# Patient Record
Sex: Male | Born: 1935 | Race: White | Hispanic: No | Marital: Married | Smoking: Former smoker
Health system: Southern US, Community
[De-identification: ages and names within clinical notes are randomized; demographics above are authoritative.]

## PROBLEM LIST (undated history)

## (undated) DIAGNOSIS — M069 Rheumatoid arthritis, unspecified: Secondary | ICD-10-CM

## (undated) DIAGNOSIS — D649 Anemia, unspecified: Secondary | ICD-10-CM

## (undated) DIAGNOSIS — A4159 Other Gram-negative sepsis: Secondary | ICD-10-CM

## (undated) DIAGNOSIS — T4145XA Adverse effect of unspecified anesthetic, initial encounter: Secondary | ICD-10-CM

## (undated) DIAGNOSIS — K635 Polyp of colon: Secondary | ICD-10-CM

## (undated) DIAGNOSIS — G9341 Metabolic encephalopathy: Secondary | ICD-10-CM

## (undated) DIAGNOSIS — K2270 Barrett's esophagus without dysplasia: Secondary | ICD-10-CM

## (undated) DIAGNOSIS — IMO0002 Reserved for concepts with insufficient information to code with codable children: Secondary | ICD-10-CM

## (undated) DIAGNOSIS — T8859XA Other complications of anesthesia, initial encounter: Secondary | ICD-10-CM

## (undated) DIAGNOSIS — N189 Chronic kidney disease, unspecified: Secondary | ICD-10-CM

## (undated) DIAGNOSIS — M1990 Unspecified osteoarthritis, unspecified site: Secondary | ICD-10-CM

## (undated) DIAGNOSIS — I10 Essential (primary) hypertension: Secondary | ICD-10-CM

## (undated) DIAGNOSIS — Z87891 Personal history of nicotine dependence: Secondary | ICD-10-CM

## (undated) DIAGNOSIS — J449 Chronic obstructive pulmonary disease, unspecified: Secondary | ICD-10-CM

## (undated) DIAGNOSIS — R911 Solitary pulmonary nodule: Secondary | ICD-10-CM

## (undated) HISTORY — PX: JOINT REPLACEMENT: SHX530

## (undated) HISTORY — PX: APPENDECTOMY: SHX54

## (undated) HISTORY — PX: BACK SURGERY: SHX140

## (undated) HISTORY — PX: HERNIA REPAIR: SHX51

## (undated) HISTORY — DX: Personal history of nicotine dependence: Z87.891

---

## 1998-05-10 ENCOUNTER — Encounter: Admission: RE | Admit: 1998-05-10 | Discharge: 1998-05-10 | Payer: Self-pay | Admitting: *Deleted

## 2001-07-21 ENCOUNTER — Encounter: Payer: Self-pay | Admitting: Internal Medicine

## 2001-07-21 ENCOUNTER — Encounter: Admission: RE | Admit: 2001-07-21 | Discharge: 2001-07-21 | Payer: Self-pay | Admitting: Internal Medicine

## 2002-06-07 ENCOUNTER — Encounter: Payer: Self-pay | Admitting: Internal Medicine

## 2002-06-07 ENCOUNTER — Encounter: Admission: RE | Admit: 2002-06-07 | Discharge: 2002-06-07 | Payer: Self-pay | Admitting: Internal Medicine

## 2003-02-18 ENCOUNTER — Emergency Department (HOSPITAL_COMMUNITY): Admission: EM | Admit: 2003-02-18 | Discharge: 2003-02-18 | Payer: Self-pay | Admitting: Emergency Medicine

## 2003-04-27 ENCOUNTER — Emergency Department (HOSPITAL_COMMUNITY): Admission: EM | Admit: 2003-04-27 | Discharge: 2003-04-28 | Payer: Self-pay | Admitting: Emergency Medicine

## 2003-06-20 ENCOUNTER — Ambulatory Visit (HOSPITAL_COMMUNITY): Admission: RE | Admit: 2003-06-20 | Discharge: 2003-06-20 | Payer: Self-pay | Admitting: Specialist

## 2003-06-20 ENCOUNTER — Encounter: Payer: Self-pay | Admitting: Specialist

## 2003-07-16 ENCOUNTER — Ambulatory Visit (HOSPITAL_COMMUNITY): Admission: RE | Admit: 2003-07-16 | Discharge: 2003-07-16 | Payer: Self-pay | Admitting: *Deleted

## 2003-07-19 ENCOUNTER — Encounter: Payer: Self-pay | Admitting: Specialist

## 2003-07-23 ENCOUNTER — Inpatient Hospital Stay (HOSPITAL_COMMUNITY): Admission: RE | Admit: 2003-07-23 | Discharge: 2003-07-27 | Payer: Self-pay | Admitting: Specialist

## 2003-07-23 ENCOUNTER — Encounter: Payer: Self-pay | Admitting: Specialist

## 2003-08-31 ENCOUNTER — Inpatient Hospital Stay (HOSPITAL_COMMUNITY): Admission: EM | Admit: 2003-08-31 | Discharge: 2003-09-05 | Payer: Self-pay | Admitting: Emergency Medicine

## 2003-08-31 ENCOUNTER — Encounter: Payer: Self-pay | Admitting: Specialist

## 2003-09-01 ENCOUNTER — Encounter (INDEPENDENT_AMBULATORY_CARE_PROVIDER_SITE_OTHER): Payer: Self-pay | Admitting: Specialist

## 2003-09-05 ENCOUNTER — Inpatient Hospital Stay (HOSPITAL_COMMUNITY)
Admission: RE | Admit: 2003-09-05 | Discharge: 2003-09-14 | Payer: Self-pay | Admitting: Physical Medicine & Rehabilitation

## 2003-09-26 ENCOUNTER — Encounter: Admission: RE | Admit: 2003-09-26 | Discharge: 2003-09-26 | Payer: Self-pay | Admitting: Thoracic Surgery

## 2003-11-20 ENCOUNTER — Encounter: Admission: RE | Admit: 2003-11-20 | Discharge: 2003-11-20 | Payer: Self-pay | Admitting: Thoracic Surgery

## 2006-01-29 ENCOUNTER — Encounter (HOSPITAL_BASED_OUTPATIENT_CLINIC_OR_DEPARTMENT_OTHER): Admission: RE | Admit: 2006-01-29 | Discharge: 2006-02-25 | Payer: Self-pay | Admitting: Surgery

## 2006-03-04 ENCOUNTER — Encounter (HOSPITAL_BASED_OUTPATIENT_CLINIC_OR_DEPARTMENT_OTHER): Admission: RE | Admit: 2006-03-04 | Discharge: 2006-04-14 | Payer: Self-pay | Admitting: Internal Medicine

## 2006-04-28 DIAGNOSIS — K2270 Barrett's esophagus without dysplasia: Secondary | ICD-10-CM

## 2006-04-28 DIAGNOSIS — K635 Polyp of colon: Secondary | ICD-10-CM

## 2006-04-28 HISTORY — DX: Barrett's esophagus without dysplasia: K22.70

## 2006-04-28 HISTORY — DX: Polyp of colon: K63.5

## 2006-05-10 ENCOUNTER — Ambulatory Visit (HOSPITAL_COMMUNITY): Admission: RE | Admit: 2006-05-10 | Discharge: 2006-05-10 | Payer: Self-pay | Admitting: *Deleted

## 2006-05-10 ENCOUNTER — Encounter (INDEPENDENT_AMBULATORY_CARE_PROVIDER_SITE_OTHER): Payer: Self-pay | Admitting: Specialist

## 2006-12-12 ENCOUNTER — Emergency Department (HOSPITAL_COMMUNITY): Admission: EM | Admit: 2006-12-12 | Discharge: 2006-12-13 | Payer: Self-pay | Admitting: *Deleted

## 2007-08-12 ENCOUNTER — Inpatient Hospital Stay (HOSPITAL_COMMUNITY): Admission: EM | Admit: 2007-08-12 | Discharge: 2007-08-14 | Payer: Self-pay | Admitting: Emergency Medicine

## 2007-12-21 ENCOUNTER — Encounter: Admission: RE | Admit: 2007-12-21 | Discharge: 2007-12-21 | Payer: Self-pay | Admitting: Internal Medicine

## 2009-04-13 ENCOUNTER — Emergency Department (HOSPITAL_COMMUNITY): Admission: EM | Admit: 2009-04-13 | Discharge: 2009-04-13 | Payer: Self-pay | Admitting: Emergency Medicine

## 2009-04-26 ENCOUNTER — Inpatient Hospital Stay (HOSPITAL_COMMUNITY): Admission: EM | Admit: 2009-04-26 | Discharge: 2009-05-03 | Payer: Self-pay | Admitting: Emergency Medicine

## 2009-04-29 ENCOUNTER — Encounter: Payer: Self-pay | Admitting: Internal Medicine

## 2009-04-29 ENCOUNTER — Ambulatory Visit: Payer: Self-pay | Admitting: Pulmonary Disease

## 2009-04-30 ENCOUNTER — Ambulatory Visit: Payer: Self-pay | Admitting: Thoracic Surgery

## 2009-05-01 ENCOUNTER — Encounter: Payer: Self-pay | Admitting: Thoracic Surgery

## 2009-05-01 ENCOUNTER — Encounter (INDEPENDENT_AMBULATORY_CARE_PROVIDER_SITE_OTHER): Payer: Self-pay

## 2009-05-03 ENCOUNTER — Telehealth: Payer: Self-pay | Admitting: Internal Medicine

## 2009-05-06 ENCOUNTER — Telehealth (INDEPENDENT_AMBULATORY_CARE_PROVIDER_SITE_OTHER): Payer: Self-pay | Admitting: *Deleted

## 2009-05-14 DIAGNOSIS — M069 Rheumatoid arthritis, unspecified: Secondary | ICD-10-CM

## 2009-05-14 DIAGNOSIS — I10 Essential (primary) hypertension: Secondary | ICD-10-CM | POA: Insufficient documentation

## 2009-05-15 ENCOUNTER — Ambulatory Visit: Payer: Self-pay | Admitting: Internal Medicine

## 2009-05-15 DIAGNOSIS — J984 Other disorders of lung: Secondary | ICD-10-CM

## 2009-05-15 DIAGNOSIS — J9859 Other diseases of mediastinum, not elsewhere classified: Secondary | ICD-10-CM | POA: Insufficient documentation

## 2009-05-20 ENCOUNTER — Encounter: Admission: RE | Admit: 2009-05-20 | Discharge: 2009-05-20 | Payer: Self-pay | Admitting: Thoracic Surgery

## 2009-05-22 ENCOUNTER — Ambulatory Visit: Payer: Self-pay | Admitting: Thoracic Surgery

## 2009-05-27 ENCOUNTER — Encounter: Payer: Self-pay | Admitting: Internal Medicine

## 2009-05-29 ENCOUNTER — Encounter: Payer: Self-pay | Admitting: Internal Medicine

## 2009-07-16 ENCOUNTER — Encounter: Payer: Self-pay | Admitting: Internal Medicine

## 2009-09-03 ENCOUNTER — Telehealth: Payer: Self-pay | Admitting: Internal Medicine

## 2010-02-21 ENCOUNTER — Observation Stay (HOSPITAL_COMMUNITY): Admission: EM | Admit: 2010-02-21 | Discharge: 2010-02-21 | Payer: Self-pay | Admitting: Emergency Medicine

## 2010-06-03 ENCOUNTER — Emergency Department (HOSPITAL_COMMUNITY): Admission: EM | Admit: 2010-06-03 | Discharge: 2010-06-04 | Payer: Self-pay | Admitting: Emergency Medicine

## 2010-06-13 ENCOUNTER — Encounter: Admission: RE | Admit: 2010-06-13 | Discharge: 2010-06-13 | Payer: Self-pay | Admitting: Internal Medicine

## 2010-10-18 ENCOUNTER — Encounter: Payer: Self-pay | Admitting: Specialist

## 2010-10-19 ENCOUNTER — Encounter: Payer: Self-pay | Admitting: Internal Medicine

## 2010-10-20 ENCOUNTER — Encounter: Payer: Self-pay | Admitting: Thoracic Surgery

## 2011-01-03 LAB — BASIC METABOLIC PANEL
BUN: 26 mg/dL — ABNORMAL HIGH (ref 6–23)
CO2: 32 mEq/L (ref 19–32)
CO2: 32 mEq/L (ref 19–32)
Calcium: 10 mg/dL (ref 8.4–10.5)
Calcium: 10.2 mg/dL (ref 8.4–10.5)
Chloride: 100 mEq/L (ref 96–112)
Chloride: 103 mEq/L (ref 96–112)
Creatinine, Ser: 1.06 mg/dL (ref 0.4–1.5)
GFR calc Af Amer: >60 mL/min (ref >60)
GFR calc Af Amer: >60 mL/min (ref >60)
GFR calc Af Amer: >60 mL/min (ref >60)
Glucose, Bld: 91 mg/dL (ref 70–99)
Potassium: 3.6 mEq/L (ref 3.5–5.1)
Potassium: 3.8 mEq/L (ref 3.5–5.1)
Sodium: 140 mEq/L (ref 135–145)
Sodium: 140 mEq/L (ref 135–145)

## 2011-01-03 LAB — HISTOPLASMA ANTIBODIES: Mycelia phase ab: <1:8 {titer}

## 2011-01-03 LAB — CLOSTRIDIUM DIFFICILE EIA: C difficile Toxins A+B, EIA: NEGATIVE

## 2011-01-03 LAB — CULTURE, RESPIRATORY W GRAM STAIN

## 2011-01-03 LAB — STOOL CULTURE

## 2011-01-03 LAB — HISTOPLASMA ANTIGEN, URINE
Histoplasma Antigen Urine: NEGATIVE
Histoplasma Antigen, urine: <2 U/mL

## 2011-01-03 LAB — CBC
MCHC: 33.9 g/dL (ref 30.0–36.0)
MCV: 87.6 fL (ref 78.0–100.0)
Platelets: 227 10*3/uL (ref 150–400)
RBC: 4.38 MIL/uL (ref 4.22–5.81)
WBC: 6.6 10*3/uL (ref 4.0–10.5)

## 2011-01-03 LAB — FUNGUS CULTURE W SMEAR

## 2011-01-03 LAB — RENAL FUNCTION PANEL
BUN: 23 mg/dL (ref 6–23)
CO2: 32 mEq/L (ref 19–32)
Chloride: 100 mEq/L (ref 96–112)
Glucose, Bld: 95 mg/dL (ref 70–99)
Phosphorus: 2.6 mg/dL (ref 2.3–4.6)
Potassium: 3.8 mEq/L (ref 3.5–5.1)
Sodium: 138 mEq/L (ref 135–145)

## 2011-01-03 LAB — ANGIOTENSIN CONVERTING ENZYME: Angiotensin-Converting Enzyme: 16 U/L (ref 9–67)

## 2011-01-03 LAB — APTT: aPTT: 23 seconds — ABNORMAL LOW (ref 24–37)

## 2011-01-03 LAB — AFB CULTURE WITH SMEAR (NOT AT ARMC): Acid Fast Smear: NONE SEEN

## 2011-01-03 LAB — MPO/PR-3 (ANCA) ANTIBODIES

## 2011-01-03 LAB — CROSSMATCH: Antibody Screen: NEGATIVE

## 2011-01-03 LAB — PROTIME-INR: INR: 1 (ref 0.00–1.49)

## 2011-01-03 LAB — ANA: Anti Nuclear Antibody(ANA): NEGATIVE

## 2011-01-03 LAB — ANTI-NEUTROPHIL ANTIBODY

## 2011-01-04 LAB — COMPREHENSIVE METABOLIC PANEL
ALT: 16 U/L (ref 0–53)
Albumin: 2.8 g/dL — ABNORMAL LOW (ref 3.5–5.2)
Albumin: 3 g/dL — ABNORMAL LOW (ref 3.5–5.2)
Alkaline Phosphatase: 78 U/L (ref 39–117)
BUN: 20 mg/dL (ref 6–23)
Calcium: 9.1 mg/dL (ref 8.4–10.5)
Glucose, Bld: 105 mg/dL — ABNORMAL HIGH (ref 70–99)
Glucose, Bld: 92 mg/dL (ref 70–99)
Potassium: 4.1 mEq/L (ref 3.5–5.1)
Sodium: 138 mEq/L (ref 135–145)
Total Protein: 5.3 g/dL — ABNORMAL LOW (ref 6.0–8.3)
Total Protein: 5.6 g/dL — ABNORMAL LOW (ref 6.0–8.3)

## 2011-01-04 LAB — CBC
HCT: 38 % — ABNORMAL LOW (ref 39.0–52.0)
HCT: 38.4 % — ABNORMAL LOW (ref 39.0–52.0)
Hemoglobin: 12.8 g/dL — ABNORMAL LOW (ref 13.0–17.0)
Hemoglobin: 12.8 g/dL — ABNORMAL LOW (ref 13.0–17.0)
Hemoglobin: 13.4 g/dL (ref 13.0–17.0)
MCHC: 33.4 g/dL (ref 30.0–36.0)
MCHC: 33.7 g/dL (ref 30.0–36.0)
Platelets: 194 10*3/uL (ref 150–400)
Platelets: 209 10*3/uL (ref 150–400)
RBC: 4.32 MIL/uL (ref 4.22–5.81)
RDW: 16.3 % — ABNORMAL HIGH (ref 11.5–15.5)
RDW: 17.1 % — ABNORMAL HIGH (ref 11.5–15.5)

## 2011-01-04 LAB — CARDIAC PANEL(CRET KIN+CKTOT+MB+TROPI)
CK, MB: 3.6 ng/mL (ref 0.3–4.0)
Relative Index: 3.5 — ABNORMAL HIGH (ref 0.0–2.5)
Relative Index: INVALID (ref 0.0–2.5)
Total CK: 104 U/L (ref 7–232)
Total CK: 88 U/L (ref 7–232)

## 2011-01-04 LAB — PROTEIN ELECTROPHORESIS, SERUM
Alpha-2-Globulin: 17.5 % — ABNORMAL HIGH (ref 7.1–11.8)
Gamma Globulin: 8 % — ABNORMAL LOW (ref 11.1–18.8)
M-Spike, %: NOT DETECTED g/dL

## 2011-01-04 LAB — URINE MICROSCOPIC-ADD ON

## 2011-01-04 LAB — BASIC METABOLIC PANEL
BUN: 10 mg/dL (ref 6–23)
Calcium: 9.6 mg/dL (ref 8.4–10.5)
GFR calc non Af Amer: >60 mL/min (ref >60)
Glucose, Bld: 84 mg/dL (ref 70–99)
Sodium: 139 mEq/L (ref 135–145)

## 2011-01-04 LAB — DIFFERENTIAL
Basophils Relative: 1 % (ref 0–1)
Basophils Relative: 1 % (ref 0–1)
Eosinophils Absolute: 0 10*3/uL (ref 0.0–0.7)
Lymphocytes Relative: 20 % (ref 12–46)
Lymphs Abs: 1.4 10*3/uL (ref 0.7–4.0)
Monocytes Absolute: 0.5 10*3/uL (ref 0.1–1.0)
Monocytes Relative: 9 % (ref 3–12)
Monocytes Relative: 9 % (ref 3–12)
Neutro Abs: 4.7 10*3/uL (ref 1.7–7.7)
Neutrophils Relative %: 67 % (ref 43–77)
Neutrophils Relative %: 71 % (ref 43–77)

## 2011-01-04 LAB — URINALYSIS, ROUTINE W REFLEX MICROSCOPIC
Glucose, UA: NEGATIVE mg/dL
Ketones, ur: NEGATIVE mg/dL
Leukocytes, UA: NEGATIVE
Nitrite: NEGATIVE
Protein, ur: NEGATIVE mg/dL
Urobilinogen, UA: 0.2 mg/dL (ref 0.0–1.0)

## 2011-01-04 LAB — CK TOTAL AND CKMB (NOT AT ARMC): Total CK: 94 U/L (ref 7–232)

## 2011-01-04 LAB — POCT I-STAT, CHEM 8
BUN: 21 mg/dL (ref 6–23)
Chloride: 104 mEq/L (ref 96–112)
Creatinine, Ser: 0.9 mg/dL (ref 0.4–1.5)
Hemoglobin: 14.3 g/dL (ref 13.0–17.0)
Potassium: 4.1 mEq/L (ref 3.5–5.1)
Sodium: 136 mEq/L (ref 135–145)

## 2011-01-04 LAB — UIFE/LIGHT CHAINS/TP QN, 24-HR UR
Albumin, U: DETECTED
Alpha 1, Urine: DETECTED — AB
Alpha 2, Urine: DETECTED — AB
Total Protein, Urine: 7.5 mg/dL

## 2011-01-04 LAB — LIPID PANEL
HDL: 65 mg/dL (ref >39)
Triglycerides: 93 mg/dL (ref <150)

## 2011-01-04 LAB — FREE PSA
PSA, Free Pct: 20 % — ABNORMAL LOW (ref >25)
PSA, Free: 0.4 ng/mL

## 2011-01-04 LAB — URINE CULTURE: Special Requests: NEGATIVE

## 2011-02-10 NOTE — H&P (Signed)
NAME:  [name], MOLINE NO.:  [id number]   MEDICAL RECORD NO.:  [id number]          PATIENT TYPE:  EMS   LOCATION:  MAJO                         FACILITY:  [facility]   PHYSICIAN:  Peggye Pitt, M.D. DATE OF BIRTH:  04-27-1928   DATE OF ADMISSION:  04/26/2009  DATE OF DISCHARGE:                              HISTORY & PHYSICAL   PRIMARY CARE PHYSICIAN:  [name], M.D.   CHIEF COMPLAINTS:  Chest pain, leg pain and lower back pain.   HISTORY OF PRESENT ILLNESS:  [name] is a very pleasant 77 year old  Caucasian gentleman who has a longstanding history of rheumatoid  arthritis and hypertension, who presents to the hospital with worsening  of his lower back and bilateral leg pain.  It appears that in the last  couple of weeks, his rheumatologist has decreased his prednisone dose  and since then, he has been experiencing worsening in his back and lower  extremity pain.  He decided to come into the hospital today simply  because the pain was not improving.  Also, [name] was in the  emergency department on July 17th for right chest wall pain.  Chest x-  ray at that point showed an ill-defined opacity, which the ED physician  thought might represent pneumonia.  He was sent home with antibiotics  and ibuprofen.  However, he has had no resolution from this pain, and  that is another reason why he decided to come into the hospital today.  The emergency room physician has repeated a chest x-ray that showed  persistence of that ill-defined density in the right middle lobe; hence,  a CT scan of the chest was ordered that showed possible metastatic  disease.  Hence, we are called to admit him for further evaluation and  management.   Of note, [name] has had some weight loss which he describes as  intentional.  He is eating more fruits and vegetables.  He does not  exactly know how much weight he has lost, although he does say that he  has dropped 2 pant sizes and  has had to move his belt hole 3 loops.  He  had a colonoscopy performed by Dr. Virginia Rochester 3 years ago, which he was told he  was normal.  Dr. Virginia Rochester told him he did not have to have a repeat  colonoscopy.   He does have a history of an enlarged prostate and has some nocturia and  difficulty urinating at times.  He is exposed to the sun quite  frequently, in fact mows his lawn on the tractor, at least twice a week.  He does have on his chest 2 areas that may be some sort of skin cancer,  although 1 on his right side is very dark, is asymmetric, is raised, and  he states that this lesion has been progressively growing in size and  darkening in color over the past several months.  He does have a history  of some sort of skin cancer on his ear and nose, which has been excised  in the past.  He is not  a smoker.   ALLERGIES:  He has stated allergies to PENICILLIN, which cause welts,  and CODEINE which makes me crazy.   PAST MEDICAL HISTORY:  1. Significant for rheumatoid arthritis.  2. Hypertension.  3. BPH.  4. DJD of bilateral knees.   HOME MEDICATIONS:  1. Aspirin 81 mg daily.  2. Calcium plus vitamin D 3 tablets daily.  3. Folic acid 1 mg daily.  4. Leflunomide 20 mg daily.  5. Lisinopril 20 mg daily.  6. Omeprazole 20 mg daily.  7. Prednisone 8 mg daily.  8. Tramadol 50 mg twice daily as needed for pain .  9. Alendronate 70 mg weekly.  10.Ranitidine 150 mg daily.   SOCIAL HISTORY:  [name] is married.  He is retired, lives with his  wife and daughter, was a former smoker, quit over 40 years ago.  Denies  any alcohol or illicit drug use.   FAMILY HISTORY:  Significant for a sister who died of unknown cancer 3  years ago.   REVIEW OF SYSTEMS:  Negative except as already mentioned in HPI.   PHYSICAL EXAMINATION:  VITAL SIGNS  UPON ADMISSION:  Blood pressure  135/77, heart rate 92, respirations 22, O2 sats 99% on room air with a  temperature of 98.2.  GENERAL:  He is alert, awake,  oriented x3, does not appear to be in any  distress.  HEENT: Normocephalic, atraumatic.  His pupils are equally reactive to  light and accommodation with intact extraocular movements.  NECK:  Supple.  No JVD, no lymphadenopathy, no bruits, no goiter.  HEART:  Regular with extra systolic beats.  He has no murmurs, rubs or  gallops that I can auscultate.  LUNGS:  Appear clear bilaterally.  ABDOMEN:  Soft, nontender, nondistended with positive bowel sounds.  EXTREMITIES:  He has no clubbing, cyanosis or edema with positive pedal  pulses.  He does have onychomycosis.  NEUROLOGIC:  Appears grossly intact and nonfocal.  SKIN:  I have performed a detailed examination of his skin.  He does  have 2 lesions in his chest, right in the upper sternal area.  The 1 on  the left looks verrucous with darkened color.  The 1 on the right is  raised, very dark, with asymmetric borders.  He also has another small  dark lesion on his right temple which he says just popped up over the 2  months.   LABS UPON ADMISSION:  Sodium 136, potassium 4.1, chloride 104, bicarb  27, BUN 21, creatinine 0.9, glucose of 105.  Troponin 0.03.   A chest x-ray that shows an ill-defined density in the right middle  lobe, worrisome for pulmonary mass.   A CT scan of the chest that is suspicious for metastatic disease in the  chest with multiple small pulmonary nodules and right hilar and  mediastinal adenopathy.  There is no dominant mass; however, the largest  mass is in the right lower lobe that measures 5 x 12 mm.   [name] also had an EKG that showed sinus tachycardia with multiple  runs of PVCs.  He does not appear to have any acute ST or T-wave  changes.   ASSESSMENT AND PLAN:  1. Pulmonary nodules:  Of course, this raises concern for metastatic      disease, primary is unknown.  What we do know is that he had a      colonoscopy 3 years ago by Dr. Virginia Rochester that was, per patient report,  normal.  He does have a  history of BPH and with his back and lower      extremity pain, raises a question for possible metastatic prostate      disease to the spine.  Will check a PSA.  Will also check an      abdominal and pelvic CAT scan with contrast.  There is also a      question of possible melanoma on his chest, for which we may need a      biopsy if we cannot find anything on his CAT scan.  If all else      fails, then we may need to consult CVTS for biopsy of his pulmonary      nodule, which appears to be accessible, as it is close to the chest      wall.  In the meantime, we will treat his back and leg pain with      pain medications.  Depending on results of his workup, he may be      able to be discharged in 24 to 48 hours for follow-up with his      primary care physician.  For his chest pain, which sounds very      pleuritic and is over the right chest wall, I suspect this is      associated to his nodule.  Nonetheless, will rule him out for acute      coronary syndrome, given he does have some risk factors.  2. For his rheumatoid arthritis, will continue his prednisone and      leflunomide.  3. Hypertension, which is well-controlled at this time.  We will      continue his lisinopril.  4. For prophylaxis while in the hospital, he will be on Protonix for      GI prophylaxis and on Lovenox for DVT prophylaxis.      Peggye Pitt, M.D.  Electronically Signed     EH/MEDQ  D:  04/26/2009  T:  04/26/2009  Job:  [id number]

## 2011-02-10 NOTE — H&P (Signed)
NAME:  AIJALON, [name] NO.:  [id number]   MEDICAL RECORD NO.:  [id number]          PATIENT TYPE:  EMS   LOCATION:  MAJO                         FACILITY:  [facility]   PHYSICIAN:  Adolph Pollack, M.D.DATE OF BIRTH:  Jun 12, 1928   DATE OF ADMISSION:  08/12/2007  DATE OF DISCHARGE:                              HISTORY & PHYSICAL   REASON FOR ADMISSION:  Small-bowel obstruction.   HISTORY OF PRESENT ILLNESS:  Mr. Mcandrew is a 77 year old male who states  about 2 days ago he had the onset of abdominal distention, nausea, and  vomiting.  He was seen in a medical facility and given some Phenergan  and told he had may have a virus. However, last night the vomiting  became worrisome, associated with abdominal cramping.  Last bowel  movement was early Thursday morning with the passage of gas.  He  presented to the emergency department and was evaluated by the emergency  department physician.  He had x-rays that were consistent with a small-  bowel obstruction, and I was subsequently asked to see him because of  this.  He states that he has not had a bowel obstruction in the past.  He denies any fever or chills.   PAST MEDICAL HISTORY:  1. Hypertension  2. Rheumatoid arthritis.  3. Bronchitis.  4. Pneumonia.  5. Venous stasis ulcers of lower extremities.  6. Benign prostatic.  7. Benign prostatic hypertrophy.  8. Herniated disk, C5-6 and T2-3.  9. Lumbar spine disease.  10.Degenerative joint disease, both knees.   PAST SURGICAL HISTORY:  1. Exploratory laparotomy for perforated appendicitis.  2. Lumbar laminectomy.  3. Anterior diskectomy, T2-T3, through a median sternotomy approach.  4. Bilateral knee replacements.   ALLERGIES:  CODEINE AND PENICILLIN.   MEDICATIONS:  Lisinopril, folic acid, methotrexate, prednisone,  sulindac, Tessalon Perles, Ultram, Zantac, Flomax.   SOCIAL HISTORY:  He is a former heavy smoker but quit many years ago.  Former heavy drinker,  but he quit many years ago.   REVIEW OF SYSTEMS:  CARDIOVASCULAR:  He denies any known heart disease.  PULMONARY:  He denies asthma or tuberculosis.  GI:  He denies peptic  ulcer disease, diverticulitis, or hepatitis.  GU:  He has BPH but is  better with Flomax.  ENDOCRINE:  No diabetes or hypercholesterolemia.  NEUROLOGIC:  Denies strokes or seizures.  HEMATOLOGIC:  He denies  bleeding disorders or blood clots.  He states he may have had a  transfusion in the past.   PHYSICAL EXAMINATION:  GENERAL:  An elderly male in no acute distress.  NG tube is in draining some feculent-appearing material.  VITAL SIGNS:  Blood pressure is 93/54, pulse 90, respiratory rate 16,  saturations 94%, initial temperature 96.6.  NECK:  Supple without obvious mass.  CHEST:  There is a well-healed sternal scar present.  Breath sounds are  equal and clear.  CARDIOVASCULAR:  Regular rate regular rhythm.  I hear no murmur.  EXTREMITIES:  There is no lower extremity edema.  ABDOMEN:  Slightly  firm and distended.  There is mild diffuse tenderness to  palpation but  no peritoneal signs.  There are occasional high-pitched bowel sounds  heard.  There is a lower midline scar without hernia.  GU:  No inguinal hernia is noted.  RECTAL:  He currently is having a liquid BM.  It was guaiac-negative on  Dr. Harmon Pier exam.  MUSCULOSKELETAL:  He has bilateral scars on his knees and some evidence  of chronic venous stasis disease as well.  NEUROLOGIC:  He is alert,  awake, alert and answers questions appropriately.   LABORATORY DATA:  White blood cell count 9700, hemoglobin 16.1.   X-rays demonstrate dilated small-bowel loops with a few air-fluid levels  and no free air.  Some gas in the right colon.  Urinalysis 3-6 red blood  cells, 3-6 white blood cells, and very few bacteria.   IMPRESSION:  1. Partial small-bowel obstruction - currently having a liquid bowel      movement.  Still having some intermittent cramping pains  and some      distention.  2. He had some hypotension upon arrival.  I think this is most likely      secondary to hypovolemia.  However, it could also be secondary to      acute adrenal insufficiency given he is on chronic prednisone and      has been vomiting for the past 2 days.   PLAN:  Will admit to the hospital start hydration.  Will give a steroid  bolus and get on the maintenance dose steroid through the IV.  Will  check a CMET.  We will recheck x-rays this afternoon.  Will attempt  medical therapy with hydration and decompression, and this fails, he may  need exploratory laparotomy.  This has been explained to him.      Adolph Pollack, M.D.  Electronically Signed     TJR/MEDQ  D:  08/12/2007  T:  08/12/2007  Job:  [id number]   cc:   [name], M.D.

## 2011-02-10 NOTE — Group Therapy Note (Signed)
NAME:  [name], [name] NO.:  [id number]   MEDICAL RECORD NO.:  [id number]          PATIENT TYPE:  OUT   LOCATION:  XRAY                         FACILITY:  [facility]   PHYSICIAN:  Ruthy Dick, MD    DATE OF BIRTH:  [birth year]                                 PROGRESS NOTE   PROBLEM LIST TO DATE:  1. Lung nodules and mediastinal lymphadenopathy.  PET scan done which      shows hypermetabolic uptake consistent with metastatic disease. I      am in the process of contacting interventional radiology for      possible biopsy.  2. Atypical chest pain.  This was noncardiac and has resolved and      negative enzymes.  3. Hypertension, controlled.  4. Hypokalemia, resolved.  5. Urinary tract infection, treated, last day today.  6. Rheumatoid arthritis, on his home medications.  7. Benign prostatic hypertrophy.  PSA is normal during this admission.  8. Degenerative joint disease.  9. Low back pain, bone scan ordered but still not yet done.   CONSULTS DURING THIS ADMISSION:  Pulmonology consult.   PROCEDURES DONE DURING THIS ADMISSION:  1. CT scan of the chest which showed findings which were concerning      for metastatic disease in the chest with multiple small pulmonary      nodules being present with right hilar and mediastinal      lymphadenopathy.  There was no dominant mass, the largest mass in      the right lower lobe being 5 mm x 12 mm.  CT scan of the abdomen      with contrast was done and was read as having a 1-cm lower pole      renal complex cyst or solid mass, and an MRI of the abdomen was      suggested. 1.0-cm left renal cyst or mass.  2. CT scan of the pelvis which showed a large lobulated inhomogeneous      prostate gland.  There was a possibility of a bladder outlet      obstruction and sigmoid diverticulosis without diverticulitis.  3. PET scan of the chest which showed hypermetabolic lymphadenopathy      in mediastinum and right supraclavicular  space which is consistent      with metastatic disease or lymphoma.   BRIEF HISTORY OF PRESENT ILLNESS AND HOSPITAL COURSE:  This is a 77 year old male with a longstanding history of rheumatoid  arthritis and hypertension who came to the hospital with worsening low  back pain and bilateral leg pain.  Chest x-ray revealed lymphadenopathy  and he was admitted for further evaluation.  He got ruled out for his  chest pain for acute coronary syndrome with serial enzymes and EKG.  As  noted above, workup revealed that the patient has possible metastatic  disease and we are in the process of contacting interventional radiology  for possible biopsy, either fine-needle aspiration or CT-guided biopsy.  The pulmonologist is on board and at this point I am not sure if they  want to bronchoscopy, looking at the  notations.  The patient is doing  well today.  No complaints whatsoever, no chest pain, no abdominal pain,  no nausea, no vomiting, no diarrhea, no constipation, no dysuria, no  frequency, no urgency, no syncope.  Vitals today:  Temperature 96.5,  pulse 71, respirations 30, blood pressure 131/85, saturating 93% on room  air.  Presently this patient is on the following medication:  aspirin,  calcium carbonate, ciprofloxacin which will be discontinued today,  Lovenox, folic acid, lisinopril, leflunomide, pantoprazole, and  prednisone.   DISPOSITION:  Disposition will be achieved after the patient has been worked up and  tissue samples obtained.  Further workup plans would also address  whether the patient will have a colonoscopy for further evaluation.  If  the tissue sample shows that the patient has cancer, then we will be  able to consult oncology at that point.      Ruthy Dick, MD  Electronically Signed     GU/MEDQ  D:  04/29/2009  T:  04/29/2009  Job:  [id number]

## 2011-02-10 NOTE — Letter (Signed)
May 22, 2009   [name], MD  [address]  [place], [place] [address]   Re:  LEGION, DISCHER                DOB:  [birth year]   Dear Dr. Ricki Miller:   I saw the patient back today and his incision is well healed on his  right scalene node.  His blood pressure was 162/92, pulse 95,  respirations 18, and sats were 97%.  As of right now, nothing is growing  out as far as cultures of his necrotizing granulomatous disease.  From  my standpoint, I will be happy to see him again.  I thought let him know  if I hear anything from his culture.   Sincerely,   Ines Bloomer, M.D.  Electronically Signed   DPB/MEDQ  D:  05/22/2009  T:  05/22/2009  Job:  [id number]

## 2011-02-10 NOTE — Discharge Summary (Signed)
NAME:  [name], [name] NO.:  [id number]   MEDICAL RECORD NO.:  [id number]           PATIENT TYPE:   LOCATION:                                 FACILITY:   PHYSICIAN:  Beckey Rutter, MD  DATE OF BIRTH:  1928/01/01   DATE OF ADMISSION:  DATE OF DISCHARGE:                               DISCHARGE SUMMARY   ADDENDUM   PRIMARY CARE PHYSICIAN:  Primary care physician is in [place], [place].   For the last 3 days, the following issues transpired.  The patient was  continued to be seen by Thoracic Surgery who cleared him for discharge.   The patient was seen on followup by Dr. Marchelle Gearing, who wanted him to  follow up within 2 weeks.  In the meanwhile, the patient was prescribed  itraconazole 200 mg 3 times with his meals for 3 days and then 200 mg  twice a day.  Prescription was given.   DISCHARGE DIAGNOSES:  1. 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  2. Status post video-assisted thoracoscopy for empyema.  3. Acute renal failure, improved.  4. History of coronary artery disease.  5. Hyperlipidemia.  6. Probably new onset diabetes. questionable!  7. Hypertension.  8. Chronic anemia.   DISCHARGE MEDICATIONS:  1. Itraconazole 300 mg p.o. 3 times a day for 3 days and then 200 mg      p.o. 2 times a day.  2. Aspirin 81 mg daily.  3. Calcium carbonate 1 tablet p.o. t.i.d.  4. Folic acid 1 mg daily.  5. Lisinopril 5 mg p.o. daily.  6. Protonix p.o. daily.  7. Prednisone 80 mg p.o. daily.  8. Tylenol 650 mg p.o. q.4 h. p.r.n.  9. Tramadol 50 mg twice a day p.r.n.  10.Ranitidine 15 mg p.o. daily.  11.Alendronate 70 mg weekly.   X-RAY AND IMAGING:  1. The patient had chest x-ray on April 30, 2009.  Impression:  There      is no acute finding and the patient with known pulmonary nodule.  2. The PET scan done on April 29, 2009.  Impression:  It was showing      hypermetabolic lymphadenopathy in the mediastinum on the right      supraclavicular space is  consistent with metastatic disease or      lymphoma.  Multiple bilateral tiny parenchymal lung nodules do not      show increased FDG uptake on the PET CT, but given the small size,      there may be below the size threshold for PET resolution.   DISCHARGE PLAN:  The patient will be discharged to follow up with Dr.  Marchelle Gearing.  The phone number is provided in the discharge instructions.  The patient will follow up with Thoracic Surgery as also outlined on the  discharge instructions.  He is aware and agreeable to discharge plan.      Beckey Rutter, MD  Electronically Signed     EME/MEDQ  D:  05/03/2009  T:  05/04/2009  Job:  [id number]

## 2011-02-10 NOTE — Op Note (Signed)
NAME:  [name], [name] NO.:  [id number]   MEDICAL RECORD NO.:  [id number]          PATIENT TYPE:  OUT   LOCATION:  XRAY                         FACILITY:  [facility]   PHYSICIAN:  Ines Bloomer, M.D. DATE OF BIRTH:  08-07-1928   DATE OF PROCEDURE:  DATE OF DISCHARGE:  04/29/2009                               OPERATIVE REPORT   PREOPERATIVE DIAGNOSIS:  Mediastinal adenopathy, positive on PET.   POSTOPERATIVE DIAGNOSES:  Mediastinal adenopathy, positive on PET,  necrotizing granulomatous disease.   SURGEON:  Ines Bloomer, MD   ANESTHESIA:  General anesthesia.   After percutaneous insertion of all monitoring lines, the patient was  prepped and draped in the usual sterile manner.  Video bronchoscope was  passed through the endotracheal tube.  Carina was in the midline.  The  right upper lobe, right middle lobe, and right lower lobe orifices from  the left mainstem, left upper lobe orifices were done, all appeared to  be normal.  There is no endobronchial lesions.  Cultures were taken as  well as washings.  The video bronchoscope was removed.  The anterior  neck was prepped and draped in usual sterile manner.  A transverse  incision was made over the right scalene node and dissection was carried  down through subcutaneous tissues.  The sternocleidomastoid was split,  the jugular was reflected laterally identifying the scalene fat pad and  2 nodes were dissected out.  Frozen section of one node revealed  necrotizing granulomatous process, which would explain his enlarged  mediastinal adenopathy that was positive on PET.  Part of it was sent  for culture.  Wound was closed with 3-0 Vicryl in the muscle layer and  the subcutaneous tissue and Dermabond for the skin.  The patient  returned to recovery room in stable condition.      Ines Bloomer, M.D.  Electronically Signed     DPB/MEDQ  D:  05/01/2009  T:  05/01/2009  Job:  [id number]   cc:   Kalman Shan, MD

## 2011-02-10 NOTE — Discharge Summary (Signed)
NAME:  [name], [name] NO.:  [id number]   MEDICAL RECORD NO.:  [id number]          PATIENT TYPE:  INP   LOCATION:  5733                         FACILITY:  [facility]   PHYSICIAN:  Sandria Bales. Ezzard Standing, M.D.  DATE OF BIRTH:  Aug 06, 1928   DATE OF ADMISSION:  08/12/2007  DATE OF DISCHARGE:  08/14/2007                               DISCHARGE SUMMARY   DISCHARGE DIAGNOSES:  1. Small-bowel obstruction, probably secondary to intraabdominal      adhesions.  2. Lower abdominal wall hernia in old midline incision.  3. Hypertension.  4. Rheumatoid arthritis.  5. History of bronchitis.  6. Venous stasis ulcer, lower extremity.  7. Benign prostatic hypertrophy.  8. History of herniated disk, C5-6 and C2-3.  9. Degenerative joint disease of both knees.   OPERATION PERFORMED:  None.   HISTORY OF ILLNESS:  [name] is a 77 year old white male who is a  patient of Dr. Juline Bowers.  Presented to the [facility] Emergency Room  on August 12, 2007 with a 2-day history of abdominal distention,  nausea, vomiting.   He had been seen in a medical facility the night before.  He was given  some Phenergan for nausea; however, this obviously did not last.   X-rays obtained in the emergency room were consistent with a small-bowel  obstruction.  Dr. Abbey Chatters who was on call that night was asked to see  the patient in consultation.   The patient has multiple medical problems, which were identified in his  discharge diagnoses, none of which seemed to impact on his acute  abdominal presentation.  Only he had a prior abdominal exploration for  perforated appendix through his lower abdomen many years ago.   On admission physical exam, his pulse was 90, blood pressure 93/54, his  temperature 96.6.  His abdomen was firm and distended with diffuse  tenderness but no peritoneal signs.  He had occasional high-pitched  sounds.  White blood count was 9700 and he was admitted to the hospital  for  observation.   He had an NG tube and Foley catheter placed.  The patient, however,  started passing gas on November 15 and his NG tube was removed.  His KUB  showed a resolving small-bowel obstruction.  He is now 2 days after his  admission.  He is tolerating clear liquids.  His abdomen has gotten  soft.  He has no nausea or vomiting.  On my physical exam, he does have  a lower abdominal wall hernia in the incision, which was probably about  3 or 4 cm in diameter.  It was a little to the right side of the  abdomen, but it does not sound like this was the source of his  obstruction.  Therefore, I am not sure that repairing this thing would  change his future course.  I talked to him about this.  His labs were  checked.  This showed a sodium of 139, potassium 3.1, chloride of 106,  CO2 of 27, glucose of 122, BUN of 30, creatinine of 1.06.   DISCHARGE INSTRUCTIONS:  His  discharge instructions will include  resuming his home medicines, which include:  1. Aspirin.  2. Folic acid 1 mg daily.  3. Lisinopril 10 mg daily.  4. Methotrexate 2.5 mg daily.  5. Prednisone 5 mg daily.  6. Prilosec 20 mg daily.  7. Sulindac 200 mg daily.  8. Tessalon Perles as needed.  9. Tramadol as needed.  10.Zantac 75 mg daily.  11.Flomax 0.4 mg daily.  12.He takes calcium.  13.I also encouraged him to take a banana since his potassium is a      little bit low and eat that daily for a week or two.   FOLLOW UP:  I gave him no followup with our practice, and there are no  acute surgical issues that need follow up at this time.  I did encourage  him to check with Dr. Ricki Bowers over the next three to four weeks.      Sandria Bales. Ezzard Standing, M.D.  Electronically Signed     DHN/MEDQ  D:  08/14/2007  T:  08/14/2007  Job:  [id number]   cc:   [name], M.D.

## 2011-02-13 NOTE — Cardiovascular Report (Signed)
NAME:  [name], [name]                          ACCOUNT NO.:  [id number]   MEDICAL RECORD NO.:  [id number]                   PATIENT TYPE:  OIB   LOCATION:  2899                                 FACILITY:  [facility]   PHYSICIAN:  Darlin Priestly, M.D.             DATE OF BIRTH:  September 28, 1928   DATE OF PROCEDURE:  07/16/2003  DATE OF DISCHARGE:                              CARDIAC CATHETERIZATION   PROCEDURES:  1. Left heart catheterization.  2. Coronary angiography.  3. Left ventriculogram.  4. Abdominal aortogram.   ATTENDING PHYSICIAN:  Darlin Priestly, M.D.   COMPLICATIONS:  None.   INDICATIONS:  [name] is a 77 year old male patient of Dr. Lynelle Doctor in  [place], [place] with a history of arthritis, history of spinal  stenosis, hypertension initially referred to our office for a stress  Cardiolite in October 2004 in preparation for back surgery.  He was noted at  that time to have inferolateral wall ischemia by Cardiolite.  He is now  referred for cardiac catheterization for preop clearance.   DESCRIPTION OF OPERATION:  After obtaining informed written consent, the  patient was brought to the cardiac catheterization laboratory.  Right and  left groin were shaved and prepped and draped in the usual sterile fashion.  ECG monitor was established.  Using the modified Seldinger technique, a 6  French arterial sheath was inserted in the right femoral artery. A 6 French  diagnostic catheter was then used to performed diagnostic angiography.  This  reveals a large left main with no significant disease.  The LAD is a medium  size vessel that coursed to the apex and gives rise to two diagonal  branches.  The LAD tapers to a small vessel at the apex.  The first diagonal  is a small vessel that bifurcates distally.  There is no significant  disease.  The second diagonal is a medium size vessel which bifurcates in  the mid segment and courses to the apex.   The left coronary artery  also goes to a large ramus intermedius which  bifurcates distally and has no significant disease.   Left circumflex is a medium size vessel which courses in the AV groove and  goes to one obtuse marginal branch.  The AV groove circumflex has no  significant disease.  The first OM is a medium size vessel which bifurcates  distally and has no significant disease.   The right coronary artery is a large vessel which is dominant and gives rise  to both PDA as well as posterior lateral branch.  There is no significant  disease in the RDA, PDA or posterior lateral branch.   LEFT VENTRICULOGRAM:  Left ventriculogram is 60%.   HEMODYNAMICS:  Systemic arterial pressure 118/66, LV pressure 116/9, LVEDP  of 15.    CONCLUSIONS:  1. No significant coronary artery disease.  2. Normal left ventricular systolic function.  Darlin Priestly, M.D.    RHM/MEDQ  D:  07/16/2003  T:  07/16/2003  Job:  [id number]   cc:   Dr. Lynelle Doctor in [place], [place]   Kerrin Champagne, M.D.  [address]  [place]  [place] [address]  Fax: [phone]

## 2011-02-13 NOTE — Op Note (Signed)
NAME:  [name], [name]                          ACCOUNT NO.:  [id number]   MEDICAL RECORD NO.:  [id number]                   PATIENT TYPE:  OUT   LOCATION:  XRAY                                 FACILITY:  [facility]   PHYSICIAN:  Ines Bloomer, M.D.              DATE OF BIRTH:  05-13-1928   DATE OF PROCEDURE:  DATE OF DISCHARGE:  08/31/2003                                 OPERATIVE REPORT   PREOPERATIVE DIAGNOSIS:  T2-T3 herniated disk, paraparesis and partial  paraplegia.   POSTOPERATIVE DIAGNOSIS:  T2-T3 herniated disk, paraparesis and partial  paraplegia.   OPERATION PERFORMED:  Partial median sternotomy for exposure of T2-T3.   SURGEON:  Ines Bloomer, M.D.   ASSISTANT:  Carmin Muskrat. Eustaquio Boyden.   ANESTHESIA:  General.   DESCRIPTION OF PROCEDURE:  This patient was brought to the operating room as  emergency because of progressive weakness in his legs.  He had a T2 disk  herniation.  It was decided to do the exposure through a partial medial  sternotomy.  After general anesthesia with endotracheal tube intubation, the  left neck was turned to the left.  The neck and chest was prepped and draped  in the usual sterile manner.  Incision was made along the left  sternocleidomastoid border and carried down the midline of the sternum.  The  subcutaneous tissue was divided with electrocautery and the fascia was  divided with electrocautery.  The sternocleidomastoid was reflected  laterally and then the insertion of the clavicular head of the  sternocleidomastoid was taken down with electrocautery.  The sternum was  split down to the manubrium with a saw.  A laminar spreader was inserted.  Gelfoam was placed in the sternum.  Dissection was started identifying the  internal jugular vein.  The internal jugular vein was dissected up and  reflected laterally dividing the facial branch between clips.  There was  some bleeding from the base of the facial branch and this was oversewn  with  5-0 Prolene.  After the dissection was then carried down dividing the  omohyoid, electrocautery down to the prevertebral fascia reflecting the  trachea and the esophagus medially and the carotid and the jugular  laterally.  This area was freed up taking care not to overextend the area  because of care not to injure the recurrent laryngeal nerve.  The  sternohyoid and other sternothyroid attachments were divided with  electrocautery to expose this on the left side.  The right superior horn of  the thymus gland was dissected free dissecting up several lymph nodes which  were sent for pathological examination.  The innominate vein was dissected  up and looped with a vascular tape and retracted inferiorly and the  innominate artery was dissected, looped with a vascular tape and retracted  laterally.  This freed up the space exposed between C6 down to T3 with T2-3  space being at  the inferior portion of the wound.  After this was done, Dr.  Danielle Dess and Dr. Franky Macho performed a diskectomy with decompression of the  disk.  After that had been done, the area was irrigated copiously.  A Blake  drain was brought in through a separate stab wound and placed down in the  prevertebral space and then the sternum was closed with #5 wires with  a twisted fashion.  The sternocleidomastoid, sternohyoid and sternothyroid  muscles were reattached to the sternum and then the muscle layer was all  closed with interrupted #1 Vicryl.  Subcutaneous tissue with 2-0 Vicryl and  Ethicon skin clips.  The patient was then transferred to the recovery room  in stable condition.                                               Ines Bloomer, M.D.    DPB/MEDQ  D:  09/01/2003  T:  09/03/2003  Job:  [id number]   cc:   Coletta Memos, M.D.  [address]  [place]  [place] [address]  Fax: [phone]   Genene Churn. Love, M.D.  [address]  [address]  [place]  [place] [address]  Fax: [phone]

## 2011-02-13 NOTE — Discharge Summary (Signed)
NAME:  [name], [name]                          ACCOUNT NO.:  [id number]   MEDICAL RECORD NO.:  [id number]                   PATIENT TYPE:  INP   LOCATION:  3105                                 FACILITY:  [facility]   PHYSICIAN:  Stefani Dama, M.D.               DATE OF BIRTH:  1927-12-01   DATE OF ADMISSION:  08/31/2003  DATE OF DISCHARGE:  09/05/2003                                 DISCHARGE SUMMARY   ADMISSION DIAGNOSIS:  Paraparesis status post lumbar laminectomy.   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   OPERATIVE PROCEDURE:  Transthoracic anterior diskectomy of T2-T3 on September 01, 2003, approach by Dr. Jovita Gamma, surgery by Dr. Coletta Memos.   CONDITION ON DISCHARGE:  Improving.   HOSPITAL COURSE:  Mr. Devarion Mcclanahan is a 77 year old individual who was  recovering from a lumbar laminectomy secondary to stenosis.  He developed  the fairly acute onset of bilateral weakness in his lower extremities with  severe difficulty standing or walking even a few steps.  He was admitted to  the hospital, underwent repeat studies of the lumbar spine which  demonstrated that he had a good decompression.  Further study of the spinal  canal demonstrated that the patient had a large centrally herniated disc at  T2-T3 causing spinal cord compression.  After careful consideration, he was  advised regarding surgical decompression and this was performed on December  4 via a median sternotomy and dissection down to the region of T2-T3.  This  was a partial sternotomy opening the manubrium.  The patient tolerated the  diskectomy well.  Drains were left in place after surgery.  These were  removed 48 hours after surgery.  Clinically, the patient has done well from  a neurologic standpoint and has regained considerable strength in his legs.  Because of his debilitated state, he will require some inpatient  rehabilitation and is now being transferred to the  appropriate unit.  It was  noted that the patient had some mild elevation in his BUN after surgery.  This appears stable now, the patient is tolerating a regular diet with good  renal function and urine output at this time.  He will be seen in the office  in approximately three weeks time.  Sutures and staples will be removed per  Dr. Edwyna Shell.                                                Stefani Dama, M.D.    Merla Riches  D:  09/05/2003  T:  09/05/2003  Job:  [id number]

## 2011-02-13 NOTE — Consult Note (Signed)
NAME:  Jon, Bowers                          ACCOUNT NO.:  [id number]   MEDICAL RECORD NO.:  [id number]                   PATIENT TYPE:  INP   LOCATION:  5003                                 FACILITY:  [facility]   PHYSICIAN:  Norwood Levo, MD               DATE OF BIRTH:  03/25/1928   DATE OF CONSULTATION:  08/31/2003  DATE OF DISCHARGE:                                   CONSULTATION   CONSULTING PHYSICIAN:  Norwood Levo, MD   REFERRING PHYSICIAN:  Kerrin Champagne, M.D.   CHIEF COMPLAINT:  Can't walk, lower extremity weakness.   HISTORY OF PRESENT ILLNESS:  This is a 77 year old white male who is status  post a central lumbar laminectomy of L3-4, L4-5 and L5-S with bilateral  decompression of L3, 4, 5 and S1 on July 23, 2003.  The patient was  operated on by Dr. Otelia Sergeant without complications and was eventually  successfully discharged to home and had been doing well for three weeks.  He  states that the beginning of this present symptomatology described as lower  extremity weakness which was gradual moving up and then eventually to the  left arm began in concomitance with diarrhea. The patient states that he had  gone out for dinner, had steak and developed diarrhea later that evening and  for an entire day the day post this event and had no further diarrhea in the  following days.  Shortly thereafter, this GI distress resolved.  He states  that he had some generalized weakness.  He was seen a few days ago in the  office by Dr. Otelia Sergeant and found to have no site related symptomatology and  also had an MRI of the spine done and found to have no mass, hematoma or  fluid impingement of the spine.  He describes no fevers, chills, nausea,  vomiting, nocturnal fevers, chest pain, shortness of breath or URI like  symptomatology.  He describes increasing problems with voiding requiring a  Foley at this time.  He describes no neurological type symptoms of CNS such  as visual changes,  double vision, any TIA or CVA like symptomatology noted  by his wife who lives with him.  He describes mostly problems with  ambulation and progressive weakness of the lower extremities, left greater  than right, and accompanying left upper extremity weakness.   The patient does have rheumatoid arthritis and is seen by Dr. Jimmy Footman and  is normally on prednisone and receives methotrexate injections serially.  He  describes an increase of his prednisone from 7 mg p.o. daily to 10 mg p.o.  daily over the last three days prior to admission secondary to increasing  pain in the right hand and swelling of the articulations of the metatarsals  in the right hand.  The patient is chronically on prednisone and has not had  this withheld at any time as far as  he knows.   PAST MEDICAL HISTORY:  1. Hypertension.  2. Prostatism.  3. Rheumatoid arthritis.  4. Cellulitis of the right lower extremity in 1995.  5. Bilateral hernias repaired.  6. Lumbar spinal stenosis with herniated disk pulposus.  7. Mild post-hemorrhagic anemia.  8. Postoperative itching responding to Benadryl.   PAST SURGICAL HISTORY:  1. Bilateral TUA.  2. Bilateral hernia repair.  3. Most recently the lumbar surgery as described above.   MEDICATIONS ON ADMISSION:  1. Darvocet-N 100 one tablet p.o. daily p.r.n.  2. Prednisone 10 mg p.o. daily.  3. Methotrexate as per Dr. Jimmy Footman.  4. Folic acid 1 mg p.o. daily.  5. Accupril 10 mg p.o. daily.  6. Aspirin 81 mg p.o. daily.  7. Robaxin 500 mg p.o. p.r.n.   ALLERGIES:  1. PENICILLIN.   LABORATORY DATA:  WBC 8.1, hematocrit 35.5, platelets 379,000.  Sodium 141,  potassium 3.5, chloride 105, C02 30, glucose 101, BUN and creatinine are  22.0 and 1.0, respectively and calcium 9.2.  UA:  Small amount of blood  without white cells and essentially negative.   PHYSICAL EXAMINATION:  VITAL SIGNS:  Temperature 97.0, blood pressure  150/72, pulse 74, respirations 18, p02 96% on  room air.  GENERAL:  No acute distress with no chest pain and no shortness of breath.  HEENT:  Non-icteric sclerae.  Pupils equal, round and reactive to light.  Extraocular movements are intact.  HEART:  Regular rate and rhythm, S1 and S2.  No murmurs, rubs or gallops.  LUNGS:  Clear to auscultation bilaterally.  ABDOMEN:  Obese, positive bowel sounds, non-tender and non-distended.  No  hepatosplenomegaly.  EXTREMITIES:  Bilateral TED stockings with 1+ bilateral edema. Negative SLR  bilaterally.  3/5 lower extremity weakness compared to right and 4/5 right  upper extremity weakness compared to left.  Non-sensory loss.  NEUROLOGIC:  He is alert and oriented times three.  Cranial nerves II-XII  are grossly intact.   ASSESSMENT, PLAN AND RECOMMENDATIONS:  1. Neurological.  Progressive lower extremity weakness is unclear.  The     differential diagnosis is Guillain-Barre with a possibility secondary to     GI like symptomatology although diarrhea is short lived at this time.     Campylobacter jejunum is a possibility.  Myasthenia gravis is also in the     differential as is steroid myopathy.  I would definitely at this time     request a neurological consultation and can begin the workup with the     following laboratory testing:  ANA, RF, ACHR antibodies, cortisol level     now and in the a.m., CPK, ESR and CRP as well as stool cultures for ova     and parasites, Clostridium difficile, WBC, culture as well as a     Campylobacter jejunum culture.  I will contact Dr. Pearlean Brownie for neurological     consultation at which time we can decide whether there is a need for a     lumbar puncture, EMG studies and IV Ig should this be Guillain-Barre     syndrome although the likelihood is fairly low at this time.  2. Other issues as per history of present illness.  I would continue on     patient's prior medications.  Thank you for your consultation and we will follow with you.   ADDENDUM:  Please note  that the MRI done in the emergency room on admission  shows the following:  The fine fluid collection in  the posterior thecal sac  of approximately 7 cm length fluid collection dorsal to the thecal sac  between the facets of the laminotomy site and it measures 1.8 cm in  transverse dimension.  There appears to be no significant mass effect  locally.  No evidence of communication of the collection to the sac.  There  are postoperative  changes.  There is no coda equina.  There is no evidence of diskitis or  osteomyelitis and there are diffuse disk protrusions which are known to be  chronic from L2 through to S1.  The findings are also negative for acute  spondylo diskitis and there is a previously reported central spinal stenosis  at L3 and L4.                                               Norwood Levo, MD    APM/MEDQ  D:  08/31/2003  T:  08/31/2003  Job:  [id number]   cc:   Kerrin Champagne, M.D.  [address]  [place]  [place] [address]  Fax: [phone]   Pramod P. Pearlean Brownie, MD  Fax: [phone]

## 2011-02-13 NOTE — Assessment & Plan Note (Signed)
[facility]   NAME:  [name], [name]                ACCOUNT NO.:  [id number]   MEDICAL RECORD NO.:  [id number]      DATE OF BIRTH:  Feb 09, 1928   PHYSICIAN:  Jake Shark A. Tanda Rockers, M.D. VISIT DATE:  02/08/2006                                     OFFICE VISIT   SUBJECTIVE:  Mr. Meinzer is a 77 year old man who was seen in consultation on  Feb 01, 2006, with multiple punctate, full-thickness ulcerations of his right  lower extremity.  In the interim he has worn a compressive wrap.  He denies  fever, extreme soilage, or pain.   OBJECTIVE:  His vital signs are stable.  He is afebrile.  The wrap has been  removed disclosing some improvement in the appearance of the wounds with  decreased exudates.  A total of four wounds were photographed and were  debrided full-thickness without difficulty.  There is persistent edema and  scant drainage from both wounds.  Edema is judged to be 3+ bilaterally.  The  interim cultures have shown multiple organisms consistent with normal flora.  The punch biopsy showed changes consistent with vascular stasis.   PLAN:  We will continue the patient with external compression wrap and  serial debridements as needed.  There has been definite improvement.  We  will see him in 1 week.  In addition, we will apply a silver dressing in the  form of Prisma.           ______________________________  Theresia Majors. Tanda Rockers, M.D.     Cephus Slater  D:  02/08/2006  T:  02/08/2006  Job:  [id number]

## 2011-02-13 NOTE — Assessment & Plan Note (Signed)
[facility]   NAME:  MARCANTHONY, SLEIGHT                ACCOUNT NO.:  [id number]   MEDICAL RECORD NO.:  [id number]      DATE OF BIRTH:  [birth year]   PHYSICIAN:  Jonelle Sports. Sevier, M.D.  VISIT DATE:  03/11/2006                                     OFFICE VISIT   VITAL SIGNS:  Blood pressure 130/70, heart rate 76, respirations 16,  temperature 97.9.   PURPOSE OF TODAY'S VISIT:  This 77 year old white male has been followed for  a number of venous ulcerations on the right lower extremity which have been  rather punched out in nature and have tended to be associated with  significant slough in the wounds.   At his last visit a week ago, he had 5 active wounds with at least one  having previously resolved.   The patient reports absolutely no pain, perhaps a bit of itching, no  systemic symptoms, no change in medications since last visit.   WOUND EXAM:  Again on the right lower extremity, there are now a total of 6  open wounds, 3 on the lateral aspect of the right calf, one on the  anteromedial and rather distal aspect of the right calf and two on the  distal medial aspect of the right calf. All are filled with some degree of  slough or surface encrustation. There is evidence of a resolved wound on the  lateral aspect of the leg.   WOUND SINCE LAST VISIT:  Satisfactory progress venous ulcerations right  lower extremity.   CHANGE IN INTERVAL MEDICAL HISTORY:  Not given.   DIAGNOSIS:  Not given.   TREATMENT:  Not given.   ANESTHETIC USED:  Not given.   TISSUE DEBRIDED:  Not given.   LEVEL:  Not given.   CHANGE IN MEDS:  Not given.   COMPRESSION BANDAGE:  Not given.   OTHER:  Not given.   MANAGEMENT PLAN & GOAL:  All six of the wounds described above require some  degree of debridement either of crust or of slough. These are relatively  minor and considered partial-thickness debridements.   The exact dimensions of these wounds are described in the recorded  aspect of  the chart and will not be repeated here.   Five of the six active wounds are treated with an application of Panafil  because of the persistence of some slough. The sixth is cleaned and is  treated with an application of neosporin.   The extremity is then placed in a Profore wrap from the base of the toes to  the tibial plateau to counteract the tendency towards venous hypertension  and edema.   Followup visit here will be in one week.           ______________________________  Jonelle Sports. Cheryll Cockayne, M.D.     RES/MEDQ  D:  03/11/2006  T:  03/11/2006  Job:  [id number]

## 2011-02-13 NOTE — Op Note (Signed)
NAME:  [name], [name]                          ACCOUNT NO.:  [id number]   MEDICAL RECORD NO.:  [id number]                   PATIENT TYPE:  INP   LOCATION:  2550                                 FACILITY:  [facility]   PHYSICIAN:  Kerrin Champagne, M.D.                DATE OF BIRTH:  November 29, 1927   DATE OF PROCEDURE:  07/23/2003  DATE OF DISCHARGE:                                 OPERATIVE REPORT   PREOPERATIVE DIAGNOSES:  1. Severe lumbar spinal stenosis L3-4, L4-5 centrally.  2. Foraminal stenosis, bilateral L5-S1.  3. Herniated nucleus pulposus left L4-5.   POSTOPERATIVE DIAGNOSES:  Severe lumbar spinal stenosis L3-4, L4-5 and L5-  S1, with foraminal stenosis at L5-S1.  No herniated nucleus pulposus left L4-  5.   PROCEDURE:  1. Central lumbar laminectomy L3-4, L4-5 and L5-S1.  2. Bilateral L3, L4, L5 and S1 nerve root decompression.   SURGEON:  Kerrin Champagne, M.D.   ASSISTANT:  Wende Neighbors, P.A.-C.   ANESTHESIA:  GOT, Dr. Kaylyn Layer. [place].   ESTIMATED BLOOD LOSS:  250 mL.   DRAINS:  Hemovac x1, Foley to straight drain.   INDICATIONS FOR PROCEDURE:  The patient is a 77 year old male who has been  followed by progressive neurogenic claudication in his lower extremities.  The patient has undergone conservative management with attempts at epidural  steroid injections.  He has had gradually progressive diminishing function  with weakness in both lower extremities, difficulty with standing upright  for any length of time.  He underwent extensive evaluation, including a  myelogram which demonstrated a severe lumbar spinal stenosis, with findings  at L3-4, L4-5, with bilateral foraminal stenosis at L5-S1.  Suggestion of  herniated nucleus pulposus central and leftward at L4-5.  Failing  conservative management, the patient is brought to the operating room to  undergo a central decompressive laminectomy at L3-4, L4-5 and L5-S1, with  bilateral foraminal decompression at L5-S1.   INTRAOPERATIVE FINDINGS:  Severe central stenosis at L3-4 and L4-5,  bilateral foraminal entrapment at L5-S1.  No herniated nucleus pulposus at  left L4-5.  The findings primarily are disk on the left side at L4-5.   DESCRIPTION OF PROCEDURE:  After adequate general anesthesia with the  patient in the knee-chest position in the [place] frame, standard  preoperative antibiotics and standard prep with DuraPrep solution, and  draped in the usual manner.  A dyed Vi-Drape was used.  The incision  extending from about L2 to S1 through the skin and subcutaneous layers, and  after infiltration with Marcaine 0.5% with 1:200,000 epinephrine,  electrocautery was used to control bleeders.  Incision down to the  lumbodorsal fascia.  This was incised on both sides at5 L2, L3, L4, L5 and  S1.  Clamps were placed on the spinous process of L3 and L4.  Intraoperative  lateral radiograph demonstrating these to be set at levels that were  marked  transversely, removing a small portion of the posterior ligament with  cautery for continued identification.  A Cobb was used to elevate the  paralumbar muscles out laterally off the posterior aspects of the spinous  process of L2-3, L3-4, L4-5 and L5-S1.  A Carlen retractor then was inserted  after electrocautery was used to control the bleeders.  A Leksell rongeur  was used to remove the central portions of the lamina at the L3, L4 and L5  levels, and used to thin the posterior aspect of the lamina of L5, L4 and L3  centrally.  About 50% of the inferior aspect of the spinous process of L2  was resected and curved posterior inferiorly.  The soft tissues were then resected and the inner spinous, inner laminar  region bilaterally.  Electrocautery was used to control the bleeders.  A 3  mm Kerrison was introduced beneath the lamina of L3 through the central  portion of the lamina of L3, and then under the L4 and at L5 levels.  The  ligament of flavum was debrided at the  L2-3, L3-4, L4-5 and L5-S1 levels,  decompressing the lateral recesses.  A half inch straight osteotome is used  to perform osteotomies along the medial aspect of the L3-4, L4-5 and L5-S1  levels bilaterally using this to resect bone bilaterally.  Irrigation was then performed.  The operating room microscope was then  draped and brought into the field.  Under the operating room microscope,  then each of the neural foramens along the right side of the L3, L4, L5 and  S1 were examined.  The ligament of flavum was resected along the medial  aspect of the facets of each of these levels, and the reflected portion of  the ligament of flavum resected over the medial inferior aspects of the  facets at L3-4, L4-5 and L5-S1, decompressing the neural foramen at each  segment on the right side.  Similarly this was done on the left side.  Intraoperatively the right L5 nerve root appeared to be particularly  sensitive to any decompression work that was being done.  It appeared to be  the area of the most significant stenosis, due to lateral recess stenosis at  the L4-5 level and foraminal stenosis at L5-S1.  The left side was similarly  examined, and again decompression carried out over the left L3, L4, L5 and  S1 nerve roots, resecting the medial facets, each approximately 20% on both  sides, and resecting the reflected portion of the ligament of flavum at the  L3-4, L4-5 and L5-S1 levels on the left side.  The foramen for the L5 nerve  root on the left side was particularly tight, and was decompressed out with  resection of the superior portion of the superior articular process of S1.  With this then, irrigation was performed.  A careful inspection of the disk  over the left L4-5 level demonstrated there to be hard disk protrusion, but  no evidence of herniated disk material here.  After further irrigation, bone wax was applied to the bleeding cancellus bone surfaces, over the medial  aspect of the  facets on both sides.  Excess bone wax was removed.  Thrombin  soaked Gelfoam placed within the lateral recesses and held in place with  cottonoids and then these were removed along with cottoinoids, removing all  Gelfoam from the lateral recess.  A small portion of Gelfoam was then placed  over the posterior laminotomy site.  A  medium Hemovac drain was placed in  the depth of the incision, exiting over the left lower lumbar spine.  Then  #1 Vicryl suture was used to approximate the paralumbar muscles closely at  the L3 dissecting level.  The lumbodorsal fascia approximated in the midline  with interrupted simple figure-of-eight sutures of #1 Vicryl.  The deep  subcutaneous layer was approximated with interrupted #1-0 Vicryl sutures,  and the more superficial layers with interrupted #2-0 Vicryl sutures, and  the skin closed with a running subcu stitch of #4-0 Vicryl.  Tincture of  Benzoin and Steri-Strips were applied, with 4 x 4's, ABD pad, affixed to the  skin with Hypafix tape.  The patient then was returned to a supine position, reactivated, extubated,  and returned to the recovery room in satisfactory condition.                                                Kerrin Champagne, M.D.    Myra Rude  D:  07/23/2003  T:  07/23/2003  Job:  [id number]

## 2011-02-13 NOTE — Assessment & Plan Note (Signed)
[facility]   NAME:  [name], [name]                ACCOUNT NO.:  [id number]   MEDICAL RECORD NO.:  [id number]      DATE OF BIRTH:  [birth year]   PHYSICIAN:  Jonelle Sports. Sevier, M.D.  VISIT DATE:  02/25/2006                                     OFFICE VISIT   HISTORY:  This 77 year old white male is seen for followup of multiple  venous ulcerations (biopsy-proven) of the right lower extremity.  The  patient is on methotrexate therapy and this has been thought to be a factor  in the delayed healing of these wounds.   The patient reports no pain and the only change he has appreciated since the  last visit is a small area of skin rash at the tibial tubercle area right at  the top of his compressive wrap.  He has had no change in medications.   EXAMINATION:  Blood pressure 124/78, heart rate 64, respirations 18,  temperature 98.2.   The patient has multiple wounds on his right lower extremity, two of which  are lateral, one of which is anterior, and three of which are medial.  All  of these are described with measurements and so forth in the chart in  detail.  In addition, he does have two patchy areas of dermatitis at the  tibial tubercle area on the right which appear to represent a fungal  dermatitis.   The wounds are in most cases involved with considerable slough and  encrustation, and five wounds are full-thickness debrided.   The wounds are dressed with application of mupirocin cream to two of them,  and with Panafil gel to the remaining wounds.   Ketoconazole is applied to the area of fungal skin rash.   The leg is then placed in a Profore wrap from the toes to the knee to  prevent the edema and swelling that he manifests when not in compressive  dressings.   Followup visit will be here in 1 week.   The patient is instructed to use ketoconazole cream on a daily basis to the  area of skin rash at the tibial tubercle area on the right.   Impression today  is that these are multiple venous stasis wounds under  treatment with minimal improvement.           ______________________________  Jonelle Sports. Cheryll Cockayne, M.D.     RES/MEDQ  D:  02/25/2006  T:  02/25/2006  Job:  [id number]

## 2011-02-13 NOTE — Assessment & Plan Note (Signed)
[facility]   NAME:  [name], [name]                ACCOUNT NO.:  [id number]   MEDICAL RECORD NO.:  [id number]      DATE OF BIRTH:  07-Apr-1928   PHYSICIAN:  Jake Shark A. Tanda Rockers, M.D.      VISIT DATE:                                     OFFICE VISIT   SUBJECTIVE:  [name] is being seen for management of right lower extremity  stasis ulcers.  During the interim he has worn a Print production planner.  He reports  decreased drainage and no pain.   OBJECTIVE:  His vital signs are stable.  He is afebrile.  Inspection of the  right lower extremity shows that there has been complete resolution of  wounds number 2, 3, and number 5.  Wound number 1 is clean with a halo of  nephrotic tissue which was full-thickness  debrided without incident.   ASSESSMENT:  Improving stasis ulcerations.   PLAN:  We will reapply an [place] boot and reevaluate the patient in one week.  He was given a prescription for bilateral below the knee 30 to 40 mm  compression hose.  We anticipate that he will make the transition from wraps  to compressing hose on his next visit.           ______________________________  Theresia Majors. Tanda Rockers, M.D.     Cephus Slater  D:  03/29/2006  T:  03/29/2006  Job:  [id number]

## 2011-02-13 NOTE — Assessment & Plan Note (Signed)
[facility]   NAME:  [name], [name]                ACCOUNT NO.:  [id number]   MEDICAL RECORD NO.:  [id number]           DATE OF BIRTH:   PHYSICIAN:  Theresia Majors. Tanda Rockers, M.D. VISIT DATE:  02/15/2006                                     OFFICE VISIT   [name] returns for follow-up of his stasis ulcers of the right lower  extremity.  During the interim he has worn a multi-wrap compression Profore.  He denies fever.   OBJECTIVE:  VITAL SIGNS:  His vital signs are stable.  Blood pressure is  130/90, pulse rate is 76.  He is afebrile.  EXTREMITIES:  Examination of the lower extremity discloses a persistence of  2+ edema.  He has multiple ulcers that are separated on the lateral aspect  of the leg as well as the medial side.  All these wounds have a minimum halo  of erythema with a moderate amount of serous drainage.  There is no malodor.  There is no associated cellulitis.   We will convert the existing wounds to two.  The resulting wounds now will  be considered a single wound on the lateral aspect of the leg as well as the  medial aspect of the leg with a total of two active wounds.  All of these  multiple wounds were full thicknessly debrided with healthy bleeding tissue  and a Profore was reapplied.  We will see the patient in 10 days.           ______________________________  Theresia Majors Tanda Rockers, M.D.     [name]  D:  02/15/2006  T:  02/15/2006  Job:  [id number]

## 2011-02-13 NOTE — Discharge Summary (Signed)
NAME:  [name], [name]                          ACCOUNT NO.:  [id number]   MEDICAL RECORD NO.:  [id number]                   PATIENT TYPE:  IPS   LOCATION:  4011                                 FACILITY:  [facility]   PHYSICIAN:  Ellwood Dense, M.D.                DATE OF BIRTH:  [birth year]   DATE OF ADMISSION:  09/05/2003  DATE OF DISCHARGE:  09/14/2003                                 DISCHARGE SUMMARY   DISCHARGE DIAGNOSES:  1. Herniated nucleus pulposus C5-6/T2-3 with cord compression status post     active compression-decompression of  T2-3 median sternotomy with     microdissection September 01, 2003.  2. Pain management.  3. Hypertension.  4. Rheumatoid arthritis.  5. Benign prostatic hypertrophy.  6. Bilateral total knee arthroplasty.  7. Central laminectomy L3-4, 4-5, and L5-S1 October 2004.  8. Fungal infection.   HISTORY OF PRESENT ILLNESS:  A 77 year old white male history of central  laminectomy decompression L3-4, 4-5, L5-S1 July 25, 2003 per Dr. Otelia Sergeant,  admitted December 3 with progressive lower extremity weakness.  Upon  evaluation, MRI cervical spine with HNP C5-6, thoracic HNP T2-3 with cord  compression.  Underwent ACD T2-3 by median sternotomy with microdissection  December 4 per Dr. Edwyna Shell.  Weaned from ventilator, received steroid  protocol.  Minimum assistance for bed mobility and transfers as well as  ambulation.  Latest chemistries unremarkable.  Admitted for comprehensive  rehab program.   PAST MEDICAL HISTORY:  See Discharge Diagnoses.   ALLERGIES:  PENICILLIN.   HABITS:  Denies alcohol or tobacco.   PRIMARY CARE PHYSICIAN:  Dr. Nathanial Bowers of [place], [place].   PAST SURGICAL HISTORY:  1. Hernia repair.  2. Central laminectomy L3-4, 4-5, L5-S1 October 2004.  3. Bilateral total knee arthroplasty.  4. Appendectomy.   MEDICATIONS PRIOR TO ADMISSION:  Prednisone, methotrexate, folic acid,  Accupril, aspirin, and Robaxin.   SOCIAL HISTORY:  [place]  [place] and [place]  Independent with a walker prior to admission.  Two-level home, bedroom  downstairs, two steps to entry. Wife and daughter work day shift.   HOSPITAL COURSE:  The patient did well while in rehabilitation services with  therapies initiated on a b.i.d. basis .  The following issues were followed  during patient's rehab course.   Pertaining to [name]' HNP of C5-6, T2-3 with cord compression, he had  undergone ACD of T2-3 median sternotomy with microdissection September 01, 2003.  Concerning HNP of C5-6, conservative care with cervical collar that  was later discontinued.  His surgical site in the thoracic area healing  nicely.  Followup per Dr. Edwyna Shell.  Staples had been removed.  Functional  mobility greatly improved.  He was now modified independent in his room.  Neurovascular sensation remained intact.  He was maintained on subcutaneous  Lovenox for deep vein thrombosis prophylaxis at the time of discharge.  He  would resume his aspirin therapy after discharge.  Pain management ongoing  with the use of oxycodone and good results.  Blood pressure controlled with  Lisinopril with no headache or dizziness noted.  He was maintained on his  chronic prednisone for rheumatoid arthritis.  He had completed steroid  protocol for last back surgery.  He was also receiving his methotrexate  every  Sunday per rheumatology.  He was voiding without difficulties.  He  did have a fungal infection lesion to the lower extremities which responded  well to Diflucan.  He completed his full family teaching.  Home health  therapies would be ongoing.  It was advised to continue his walker.  Also  advised no driving.   Latest labs showed a sodium 136, potassium 4.1, BUN 29, creatinine 1.1.  Hemoglobin 11.4, hematocrit 33.1.   DISCHARGE MEDICATIONS:  1. Lisinopril 10 mg daily.  2. Protonix 40 mg daily.  3. Prednisone 10 mg daily.  4. Methotrexate 10 mg every  Sunday.  5. Diflucan 200 mg every 7 days x 3 weeks.  6. Lotrimin cream 1% twice daily x 3 weeks to affected areas.  7. Oxycodone immediate release as needed.   ACTIVITY:  As tolerated.   DIET:  Regular.   SPECIAL INSTRUCTIONS:  1. Home health physical and occupational therapy.  2. No driving.  3. The patient should follow up with Dr. Edwyna Shell, [phone], call for     appointment.      Mariam Dollar, P.A.                     Ellwood Dense, M.D.    DA/MEDQ  D:  09/13/2003  T:  09/13/2003  Job:  [id number]   cc:   Ines Bloomer, M.D.  [address]  [place]  [place] [address]   Coletta Memos, M.D.  [address]  [place] [address]  Fax: [phone]   Kerrin Champagne, M.D.  [address]  [place]  [place] [address]  Fax: [phone]   Dr. Nathanial Bowers, [place], [place]

## 2011-02-13 NOTE — Op Note (Signed)
NAME:  [name], CONLEY NO.:  [id number]   MEDICAL RECORD NO.:  [id number]          PATIENT TYPE:  AMB   LOCATION:  ENDO                         FACILITY:  [facility]   PHYSICIAN:  Georgiana Spinner, M.D.    DATE OF BIRTH:  08-14-1928   DATE OF PROCEDURE:  05/10/2006  DATE OF DISCHARGE:                                 OPERATIVE REPORT   PROCEDURE:  Upper endoscopy.   INDICATIONS:  Gastroesophageal reflux disease.   ANESTHESIA:  Demerol 25, Versed 5 mg.   PROCEDURE:  With the patient mildly sedated in the left lateral decubitus  position the Olympus videoscopic endoscope was inserted in the mouth and  passed under direct vision through the esophagus which appeared normal until  we reached distal esophagus and there were clearcut changes of Barrett's  photographed and biopsied.  We entered into the stomach, fundus, body,  antrum, duodenal bulb, second portion duodenum appeared normal.  From this  point the endoscope was slowly withdrawn taking circumferential views of  duodenal mucosa until the endoscope had been pulled back into the stomach  placed in retroflexion to view the stomach from below, the endoscope was  then straightened and withdrawn taking circumferential views remaining  gastric and esophageal mucosa.  The patient's vital signs, pulse oximeter  remained stable.  The patient tolerated procedure well without apparent  complications.   FINDINGS:  Barrett's esophagus biopsied.  Await biopsy report.  The patient  will call me for results and follow-up with me as an outpatient.  Proceed to  colonoscopy as planned.           ______________________________  Georgiana Spinner, M.D.     GMO/MEDQ  D:  05/10/2006  T:  05/10/2006  Job:  [id number]

## 2011-02-13 NOTE — Consult Note (Signed)
NAME:  [name], [name]                          ACCOUNT NO.:  [id number]   MEDICAL RECORD NO.:  [id number]                   PATIENT TYPE:  OUT   LOCATION:  XRAY                                 FACILITY:  [facility]   PHYSICIAN:  Genene Churn. Love, M.D.                 DATE OF BIRTH:  Feb 02, 1928   DATE OF CONSULTATION:  08/31/2003  DATE OF DISCHARGE:                                   CONSULTATION   PATIENT ADDRESS:  [address] [place] [address]   HISTORY OF PRESENT ILLNESS:  This 77 year old, right-hand, white, married  male with a history of rheumatoid arthritis, hypertension, right and left  knee replacements, and appendectomy, underwent central laminectomy and  decompressive procedure at L3-4, L4-5, and L5-S1 July 25, 2003.  He was  at home recuperating from his surgery using a walking, and on August 11, 2003, ate some country-style steak followed by nausea and then diarrhea for  24 hours.  On August 13, 2003, he noted the onset of progressive lower  extremity weakness, left leg greater than right, and some possible new left  arm weakness without associated numbness or bowel or bladder incontinence or  pain.  He has had some neck pain but not radiating to his arms.  He has had  some lower back pain but not radiating into his legs.  He was admitted  today, August 31, 2003, and a Foley placed because he cannot void in the  lying position.   His MRI study of the lumbar region with and without contrast showed disk  protrusions and crushing of the foramen on the right at L1-2, L2-3, L3-4,  and on the left at L5-S1.  There was a 7 cm seroma present.  There was no  evidence of discitis.  He had multiple disk protrusions.   His hemoglobin was 11.7, hematocrit 35.7, white blood cell count 8100,  platelet count 280,000.  Sed rate 18.  Sodium 141, potassium 3.5, chloride  105, CO2 content 30, glucose 101, BUN 22, creatinine 1.0, calcium 9.2.  Urinalysis  unremarkable.   He was seen by Dr. Donald Siva, and neurologic consult was obtained.   PHYSICAL EXAMINATION:  GENERAL: Well-developed white male.  VITAL SIGNS:  Blood pressure right and left arm 150/81, heart rate 80, no  bruits.  NEUROLOGIC:  Mental Status:  Alert and oriented x 3.  Cranial Nerve  Examination: Visual fields full, disks flat.  Extraocular movements full.  Corneals present.  No seventh nerve palsy.  Hearing intact.  Air conduction  greater than bone conduction.  Tongue midline, uvula midline.  Gags present.  Sternocleidomastoid and trapezius testing normal.  Motor examination  revealed 4+/5 strength in left deltoid, biceps, brachial radialis, triceps  with 2 to 3/5 proximally and distally in both lower extremities.  Deep  tendon reflexes, however, were hyperactive in the 3+ range with upgoing  plantar responses on the  right and left.  There were no definite sensory  levels but some alteration in sensation in the abdominal region.  Vibration  and joint position were intact.  He did have possibly decreased rectal tone,  but there was good sensation around the rectal region.  A Foley was in  place.   IMPRESSION:  1. Lower extremity weakness with increased deep tendon reflexes.  Suspect     myelopathy (Code 721.41).  2. Hypertension (Code 796.2).  3. Lumbar spine surgery with documented degenerative disk disease (Code     722.6).  4. Status post right and left knee replacement.  5. Rheumatoid arthritis (Code unknown).   PLAN:  Obtain MRI of the cervical and thoracic regions.                                               Genene Churn. Sandria Manly, M.D.    JML/MEDQ  D:  08/31/2003  T:  09/01/2003  Job:  [id number]   cc:   Kerrin Champagne, M.D.  [address]  [place]  [place] [address]  Fax: [phone]   Norwood Levo, MD  Fax: [phone]

## 2011-02-13 NOTE — Assessment & Plan Note (Signed)
[facility]   NAME:  [name], [name]                ACCOUNT NO.:  [id number]   MEDICAL RECORD NO.:  [id number]      DATE OF BIRTH:  02-17-1928   PHYSICIAN:  Jonelle Sports. Sevier, M.D.  VISIT DATE:  03/04/2006                                     OFFICE VISIT   HISTORY OF PRESENT ILLNESS:  This 77 year old black male is followed for  several areas of relatively superficial venous ulceration on the right lower  extremity.   As noted in the last note, the patient has been on methotrexate therapy for  a hematologic disorder and it was explained to him last week that this  likely was impairing his healing.   He saw his oncologist in the interim and he has allowed him to discontinue  the methotrexate for 3 weeks in hopes that we can get these wounds healed.   The patient reports no pain and no awareness of any significant change in  the lesions, which of course have been under wrap. He has had no excessive  drainage, no fever, or systemic symptoms. Other than the discontinuation of  methotrexate, he has had no medication change.   PHYSICAL EXAMINATION:  VITAL SIGNS:  Blood pressure is 110/80, heart rate 68  and regular, respiratory rate 16, temperature 98.1.  EXTREMITIES:  At last visit, there were a total of 5 wounds on the right  lower extremity. Happily today, the most proximal one of those in the medial  aspect of the right lower extremity is resolved.   The other four wounds are described in detail, as to their dimensions and so  forth, in the written record of today's examination. Two of these still  contain considerable slough, that is soft and fully debrided from the  wounds. These are the two more punched out lesions on the distal, medial  aspect of that calf.   One of the lesions laterally, still has some fibrinous slough in the base,  which is adherent and is therefore, not debrided. Those three wounds are  treated with an application of Panafil. One  remaining wound, which is  relatively superficial and without slough, is treated with an application of  mupirocin.   That extremity is then placed in a Pro-4 wrap from the base of the toes to  the tibial plateau area, in order to counter the venous hypertension and  trend toward edema.   IMPRESSION:  Satisfactory progress of multiple venous ulcers, right lower  extremity.   FOLLOWUP:  Visit will be here in 1 week.           ______________________________  Jonelle Sports. Cheryll Cockayne, M.D.    RES/MEDQ  D:  03/04/2006  T:  03/04/2006  Job:  [id number]

## 2011-02-13 NOTE — Discharge Summary (Signed)
NAME:  [name], [name]                          ACCOUNT NO.:  [id number]   MEDICAL RECORD NO.:  [id number]                   PATIENT TYPE:  INP   LOCATION:  5021                                 FACILITY:  [facility]   PHYSICIAN:  Kerrin Champagne, M.D.                DATE OF BIRTH:  [birth year]   DATE OF ADMISSION:  07/23/2003  DATE OF DISCHARGE:  07/27/2003                                 DISCHARGE SUMMARY   ADMISSION DIAGNOSES:  1. Severe lumbar spinal stenosis, L3-4 and L4-5 centrally.  2. Foraminal stenosis bilateral, L5-S1.  3. Herniated nucleus pulposus, left L4-5.  4. Hypertension.  5. History of prostatitis.  6. History of cellulitis, right lower leg in 1997, requiring     hospitalization.  7. Status post bilateral total knee arthroplasties.  8. Bilateral inguinal hernia repair.  9. Rheumatoid arthritis.   DISCHARGE DIAGNOSES:  1. Severe lumbar spinal stenosis, L3-4, L4-5, and L5-S1, with foraminal     stenosis at L5-S1.  No herniated nucleus pulposus at left L4-5 was noted     intraoperatively.  2. Hypertension.  3. History of prostatitis.  4. History of cellulitis, right lower leg in 1997, requiring     hospitalization.  5. Status post bilateral total knee arthroplasties.  6. Bilateral inguinal hernia repair.  7. Rheumatoid arthritis.  8. Mild posthemorrhagic anemia.  9. Postoperative itching requiring Benadryl.   PROCEDURE:  On July 23, 2003, the patient underwent central lumbar  laminectomy of L3-4, L4-5, and L5-S1.  Bilateral L3, L4, L5, and S1 nerve  root decompression performed by Dr. Otelia Sergeant, assisted by Maud Deed, P.A.-C  under general anesthesia.   CONSULTATIONS:  Dr. Riley Kill of physical medicine and rehabilitation.   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  His  symptoms were so severe that he was no longer ambulatory and was requiring a  wheelchair even  during his home time.  He was unable to stand for any length  of time.  He has failed conservative treatment, including epidural steroid  injections.  Extensive evaluation including myelogram demonstrates severe  lumbar spinal stenosis at L3-4, L4-5, and bilateral foraminal stenosis at L5-  S1.  Suggestion of herniated nucleus pulposus, central left to L4-5 was also  noted.  It was thought he would require surgical intervention for the  procedure as stated above, and was admitted to the hospital for this.   HOSPITAL COURSE:  The patient tolerated the procedure under general  anesthesia without complications.  Fortunately, intraoperatively no ruptured  disk was noted.  He had improved strength of his lower extremities  postoperatively.  His Hemovac drain was discontinued on his first  postoperative day.  He was started on physical therapy for ambulation and  gait training.  He was expected to be quite slow to progress as he had been  quite  debilitated preoperatively.  Fortunately, once his pain was better  controlled he was stable to progress fairly well with physical therapy.  A  rehab consult was obtained to see if he was a suitable candidate.  He was  felt to be suitable, but during the hospital stay while waiting on a bed he  was able to progress with his physical therapy quite well, and prior to  discharge had ambulated as much as 110 feet in the hallway.  The patient did  have some difficulty with urinating postoperatively.  His Foley catheter was  replaced and a voiding trial on his third postoperative day revealed him  voiding without difficulty.  His urinalysis was negative for urinary tract  infection.  Dressing changes were done daily and wound was found to be  healing well.  The patient did have note of a scaly rash over his buttocks  and inner thighs.  He reported that he had previously been seen by Dr.  Mayford Knife who treated him for a fungal infection with Lamisil lotion and   ointment and this did help.  The patient was treated with this during the  hospital stay, as well as Benadryl for his itching, and did receive relief  of his symptoms.  He was on ileus precautions postoperatively, and  eventually was able to begin a regular diet as he did have a bowel movement.  Hemoglobin and hematocrit dropped mildly postoperatively to 2.6 and 30.8.  He was asymptomatic from his anemia and stable at the time of discharge.  As  the patient was progressing so well with physical therapy, he was felt  stable for discharge to his home on his fourth postoperative day which was  July 27, 2003.   LABORATORY DATA:  CBC on admission with hemoglobin 13.1, hematocrit 38.7.  Hemoglobin and hematocrit postoperatively as stated above.  Coagulation  study on admission was within normal limits, as was chemistry studies.  Urinalysis preoperatively was negative for urinary tract infection, and a  repeat on July 25, 2003, showed moderate hemoglobin only.  EKG  preoperatively revealed normal sinus rhythm with frequent premature  supraventricular complexes with no previous tracings, confirmed by Dr.  Donnie Aho.  No chest x-ray preoperatively is available on the chart at the time  of this dictation.   PLAN:  The patient was discharged to his home.  Arrangements were made for  home health physical therapy for ambulation and gait training utilizing a  walker.  He is advised to keep his incision dry and clean until there is no  drainage, and then he may shower.  He will have daily dressing changes.  The  patient will avoid lifting over 10 pounds, no bending, no stooping, no  driving.  He will continue on stool softeners and laxatives as needed.  He  will be on a regular diet.   DISCHARGE MEDICATIONS:  1. Darvocet-N 100 for his discomfort.  2. He will resume all of his home medications as taken prior to admission. 3. He is chronically on steroids for his rheumatoid arthritis, and did      require perioperative steroid booster doses.  He will resume his regular     dose once he is at his home.   FOLLOWUP:  He will follow up with Dr. Otelia Sergeant 7 to 10 days postoperatively,  and has been advised to call the office to arrange this appointment.  If he  has questions or concerns prior to that time, he has been advised to call.  Wende Neighbors, P.A.                    Kerrin Champagne, M.D.    SMV/MEDQ  D:  08/30/2003  T:  08/31/2003  Job:  [id number]

## 2011-02-13 NOTE — Op Note (Signed)
NAME:  [name], [name] NO.:  [id number]   MEDICAL RECORD NO.:  [id number]          PATIENT TYPE:  AMB   LOCATION:  ENDO                         FACILITY:  [facility]   PHYSICIAN:  Georgiana Spinner, M.D.    DATE OF BIRTH:  1928-01-03   DATE OF PROCEDURE:  05/10/2006  DATE OF DISCHARGE:                                 OPERATIVE REPORT   PROCEDURE:  Colonoscopy.   INDICATIONS:  Colon polyp.   ANESTHESIA:  Demerol 35 mg.   PROCEDURE:  With the patient mildly sedated in the left lateral decubitus  position, the Olympus videoscopic colonoscope was inserted in the rectum  after a normal rectal exam and passed under direct vision to the cecum  identified by ileocecal valve and appendiceal orifice both of which were  photographed.  From this point the colonoscope was slowly withdrawn taking  circumferential views of colonic mucosa stopping only in the rectum where  small polyp was seen and removed using hot biopsy forceps technique, setting  of 20/200 blended current.  We then placed the endoscope and retroflexed  view.  From above the endoscope was straightened and withdrawn.  The  patient's vital signs, pulse oximeter remained stable.  The patient  tolerated procedure well without apparent complications.   FINDINGS:  Small polyp of rectum, otherwise unremarkable colonoscopic  examination to cecum.  Plan await biopsy report.  The patient will call me  for results and follow-up with me as an outpatient.           ______________________________  Georgiana Spinner, M.D.     GMO/MEDQ  D:  05/10/2006  T:  05/10/2006  Job:  [id number]

## 2011-02-13 NOTE — Consult Note (Signed)
NAME:  [name], STUTSMAN NO.:  [id number]   MEDICAL RECORD NO.:  [id number]          PATIENT TYPE:  REC   LOCATION:  FOOT                         FACILITY:  [facility]   PHYSICIAN:  Theresia Majors. Tanda Rockers, M.D.DATE OF BIRTH:  1928-04-29   DATE OF CONSULTATION:  02/01/2006  DATE OF DISCHARGE:                                   CONSULTATION   REASON FOR CONSULTATION:  [name] is a 77 year old man referred by Dr.  Ricki Miller for evaluation of nonhealing ulcers on his right lower extremity.   IMPRESSION:  Ulcerations, questionable etiology.   PLAN:  The ulcers were cultured and biopsied in the wound center and a  compressive dressing was applied.  We will reevaluate the patient in 1 week  for follow-up pending pathology.   SUBJECTIVE:  [name] is a 77 year old man who noted lower extremity ulcers  for approximately 3-4 months.  He has been seen by multiple health care  providers including dermatology and most recently Dr. Ricki Miller.  He reports that  they have been cultured, he has been on antibiotics, but there has not been  a biopsy.  He denies diabetes.  His past medical history is remarkable for  hypertension; also for rheumatoid arthritis and osteoarthritis.  His current  medication list includes methotrexate 250 mg weekly, folic acid 1 mg daily,  prednisone 5 mg daily, aspirin 81 mg daily, omeprazole 20 mg daily  (Prilosec), Sulindac 200 mg daily, tramadol 50 mg p.r.n., lisinopril 20 mg  one-half tablet daily, calcium 600 mg b.i.d., Fosamax 70 mg daily and  __________ 2% b.i.d. to the eye.  He is allergic to PENICILLIN.  His past  surgery has included a thoracic fusion, a lumbar diskectomy, and bilateral  total knee replacements.  His family history is positive for hypertension,  pancreatic cancer.  Socially, he is married.  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  Review of systems  discloses that he denies angina pectoris, claudication,  bowel or bladder  dysfunction, heat or cold intolerance, or recent weight gain or loss.   PHYSICAL EXAMINATION:  GENERAL:  He is an alert, oriented, cooperative man  in no acute distress.  VITAL SIGNS:  His blood pressure is 124/88, pulse rate of 84 and regular,  respirations 16, and he is afebrile.  HEENT:  Exam is clear.  NECK:  Supple.  Carotid bruits are not appreciated.  Trachea is midline.  LUNGS:  Clear.  HEART SOUNDS:  Normal.  ABDOMEN:  Soft without aneurysmal dilatation.  The femoral pulses are 3+  bilaterally.  There are no bruits.  LOWER EXTREMITIES:  Discloses punctate ulcerations scattered along the  lateral aspect as well as the medial aspect of the right lower extremity.  These ulcers appear chronically inflamed with halos of erythema and a well-  adherent exudate.  There is no malodor and there is no particular  tenderness.  The dorsalis pedis pulse is +3 bilaterally.  There are  absolutely no ulcerations on the left lower extremity.  There is associated  2+ edema  bilaterally.  NEUROLOGICALLY:  The sensation is preserved and there are no pathologic  reflexes.   DISCUSSION:  Mr. Vetrano gives a history of these ulcers having been present  for months approaching a year.  He has been seen by multiple healthcare  providers.  The appearance of the wounds on today's exam are those of  chronic  wounds.  We have proceeded with cultures and biopsies.  We have placed the  patient in a compressive wrap to avoid aggravation of ulcer in the interim  by his prolonged standing attendant with his occupation.  We will see the  patient in followup in 1 week with determination of further treatment based  on the pathology report and the culture reports.           ______________________________  Theresia Majors Tanda Rockers, M.D.     Cephus Slater  D:  02/01/2006  T:  02/02/2006  Job:  [id number]   cc:   [name], M.D.  Fax: [phone]

## 2011-02-13 NOTE — Assessment & Plan Note (Signed)
[facility]   NAME:  [name], [name]                ACCOUNT NO.:  [id number]   MEDICAL RECORD NO.:  [id number]      DATE OF BIRTH:  1927/12/09   PHYSICIAN:  Jake Shark A. Tanda Rockers, M.D. VISIT DATE:  04/07/2006                                     OFFICE VISIT   SUBJECTIVE:  [name] returns for followup of multiple stasis ulcers on his  right lower extremity.  During the interim, the patient has been fitted for  and has worn compressive hose.  He denies drainage or malodor.   OBJECTIVE:  His vital signs are stable.  He is afebrile.  Inspection of his  right lower extremity shows that all of the ulcers have completely healed.  There is a persistence of 1+ to 2+ edema.   ASSESSMENT:  Resolved wounds.   PLAN:  We are discharging the patient from the wound center.  We have  instructed him to continue to wear his bilateral 30 to 40-mm below-the-knee  compressive hose.  We have asked him that these garments should remain  therapeutic for three to four months.  He will need to be reevaluated per  his primary care physician and be represcribed his stockings at a three to  four-month interval.  The patient seems to understand.  He is discharged  after expressing gratitude for having been successfully treated in the  clinic.           ______________________________  Theresia Majors. Tanda Rockers, M.D.     Cephus Slater  D:  04/07/2006  T:  04/07/2006  Job:  [id number]

## 2011-02-13 NOTE — Assessment & Plan Note (Signed)
[facility]   NAME:  [name], [name]                ACCOUNT NO.:  [id number]   MEDICAL RECORD NO.:  [id number]      DATE OF BIRTH:  1928-01-25   PHYSICIAN:  Jonelle Sports. Sevier, M.D.  VISIT DATE:  03/18/2006                                     OFFICE VISIT   VITAL SIGNS:  Blood pressure 98/60, pulse 80 and regular, respirations 18,  temperature 98.0.   PURPOSE OF TODAY'S VISIT:  This 77 year old black male is followed for  multiplicity of venous ulcerations of the right lower extremity.   When last seen several weeks ago, several of these wounds were treated with  an application of Panafil because of a small amount of debris and the  remainder with Bactroban.   He has been in a wrap during that period of time and reports some itching,  particularly in the medial aspect of the distal lower leg but no other  symptomatology, no breakthrough drainage, no fever, no chills, no odor.   WOUND EXAM:  The wounds on the right lower extremity are measured in detail,  and this is recorded in the written component of the patient's chart and  will not be repeated here because of the numerous nature of the wounds.  With one exception, the wounds are all smaller than last visit.  The other  remains the same.  They are somewhat cleaner, although there is still a bit  of the five ulcers that remain open.   WOUND SINCE LAST VISIT:  __________   CHANGE IN INTERVAL MEDICAL HISTORY:  __________   DIAGNOSIS:  Satisfactory progress of multiple venous wounds, right lower  extremity.   TREATMENT:  No debridement is provided today.   The open wounds are all treated with an application of Panafil, and the  extremity is placed in a Profore wrap from the base of the toes to the  tibial plateau, this to stave off the edema and venous hypertension that are  causative to his wounds.   ANESTHETIC USED:  __________   TISSUE DEBRIDED:  __________   LEVEL:  __________   CHANGE IN MEDS:   He has had no change in his medications.   COMPRESSION BANDAGE:  ___________   OTHER:  __________   MANAGEMENT PLAN & GOAL:  Followup visit will be here in 10 days.           ______________________________  Jonelle Sports Cheryll Cockayne, M.D.     RES/MEDQ  D:  03/18/2006  T:  03/18/2006  Job:  [id number]

## 2011-02-13 NOTE — Op Note (Signed)
NAME:  [name], [name]                          ACCOUNT NO.:  [id number]   MEDICAL RECORD NO.:  [id number]                   PATIENT TYPE:  INP   LOCATION:  3107                                 FACILITY:  [facility]   PHYSICIAN:  Coletta Memos, M.D.                  DATE OF BIRTH:  1927/12/20   DATE OF PROCEDURE:  DATE OF DISCHARGE:                                 OPERATIVE REPORT   This is the neurosurgical portion of what was a shared operation.  I am a co-  surgeon with Ines Bloomer, M.D.   NEUROSURGICAL ASSISTANT:  Stefani Dama, M.D.   PREOPERATIVE DIAGNOSES:  1. Displaced disk T2-T3.  2. Thoracic myelopathy.   POSTOPERATIVE DIAGNOSES:  1. Displaced disk T2-T3.  2. Thoracic myelopathy.   PROCEDURE:  Anterior cervical diskectomy T2-T3 with microdissection.   APPROACH:  This will be dictated by Dr. Edwyna Shell under separate cover.  He is  the co-surgeon.   OPERATIVE FINDINGS:  Large herniated disk, T2-T3.   INDICATIONS FOR SURGERY:  This is a 77 year old who presented with  myelopathy, weakness, and decreasing sensation in the lower extremities.  An  MRI was performed, and it showed a large disk herniation at T2-T3.  I,  therefore, recommend and he agreed to undergo operative resection. Dr.  Karle Plumber was enlisted to afford exposure for the diskectomy and is co-  Careers adviser.   OPERATIVE NOTE:  After exposure of the upper thoracic spine, I with x-ray  was able to confirm needle at C5-C6.  then counting down from that level, I  was able to identify the T2-T3 disk space.  I opened the disk space with a  15 blade and then using a high-speed air drill, removed some of the portion  of the inferior T2 and superior T3 vertebral bodies.  Dr. Danielle Dess assisted  with this portion of the case. With microdissection, we were then able to  remove the disk in a piecemeal fashion and decompress the thoracic spinal  cord.  No CSF was encountered.  There was significant compression at the  T2-  T3 level.  Some Gelfoam was placed over the exposed portion.  I did not  believe that a fusion was necessary as this was in the thoracic spine, and  there is a significant amount of bone remaining.  At that point in time, I  removed the microscope from the wound, made sure that the surrounding soft  tissue structures and any vascular structures were still intact, and Dr.  Edwyna Shell subsequently closed the wound.                                               Coletta Memos, M.D.    KC/MEDQ  D:  09/01/2003  T:  09/03/2003  Job:  [id number]

## 2011-02-25 ENCOUNTER — Other Ambulatory Visit: Payer: Self-pay | Admitting: Dermatology

## 2011-03-25 ENCOUNTER — Other Ambulatory Visit: Payer: Self-pay | Admitting: Dermatology

## 2011-06-21 ENCOUNTER — Emergency Department (HOSPITAL_COMMUNITY): Payer: Medicare Other

## 2011-06-21 ENCOUNTER — Emergency Department (HOSPITAL_COMMUNITY)
Admission: EM | Admit: 2011-06-21 | Discharge: 2011-06-21 | Disposition: A | Discharge status: Home / Self Care | Payer: Medicare Other | Attending: Emergency Medicine | Admitting: Emergency Medicine

## 2011-06-21 DIAGNOSIS — K219 Gastro-esophageal reflux disease without esophagitis: Secondary | ICD-10-CM | POA: Insufficient documentation

## 2011-06-21 DIAGNOSIS — I10 Essential (primary) hypertension: Secondary | ICD-10-CM | POA: Insufficient documentation

## 2011-06-21 DIAGNOSIS — M129 Arthropathy, unspecified: Secondary | ICD-10-CM | POA: Insufficient documentation

## 2011-06-21 DIAGNOSIS — Z79899 Other long term (current) drug therapy: Secondary | ICD-10-CM | POA: Insufficient documentation

## 2011-06-21 DIAGNOSIS — M5412 Radiculopathy, cervical region: Secondary | ICD-10-CM | POA: Insufficient documentation

## 2011-06-21 DIAGNOSIS — R5381 Other malaise: Secondary | ICD-10-CM | POA: Insufficient documentation

## 2011-06-21 DIAGNOSIS — M069 Rheumatoid arthritis, unspecified: Secondary | ICD-10-CM | POA: Insufficient documentation

## 2011-06-21 DIAGNOSIS — Z7982 Long term (current) use of aspirin: Secondary | ICD-10-CM | POA: Insufficient documentation

## 2011-06-21 DIAGNOSIS — M542 Cervicalgia: Secondary | ICD-10-CM | POA: Insufficient documentation

## 2011-06-21 DIAGNOSIS — M79609 Pain in unspecified limb: Secondary | ICD-10-CM | POA: Insufficient documentation

## 2011-06-25 ENCOUNTER — Other Ambulatory Visit: Payer: Self-pay | Admitting: Internal Medicine

## 2011-06-25 DIAGNOSIS — M542 Cervicalgia: Secondary | ICD-10-CM

## 2011-06-25 DIAGNOSIS — R52 Pain, unspecified: Secondary | ICD-10-CM

## 2011-06-26 ENCOUNTER — Ambulatory Visit
Admission: RE | Admit: 2011-06-26 | Discharge: 2011-06-26 | Disposition: A | Discharge status: Home / Self Care | Payer: Medicare Other | Source: Ambulatory Visit | Attending: Internal Medicine | Admitting: Internal Medicine

## 2011-06-26 DIAGNOSIS — R52 Pain, unspecified: Secondary | ICD-10-CM

## 2011-06-26 DIAGNOSIS — M542 Cervicalgia: Secondary | ICD-10-CM

## 2011-07-07 LAB — CBC
HCT: 48.3
Hemoglobin: 16.1
MCHC: 33.4
MCV: 87.7
Platelets: 346
RBC: 5.51
RDW: 17.9 — ABNORMAL HIGH
WBC: 9.7

## 2011-07-07 LAB — TYPE AND SCREEN
ABO/RH(D): B POS
Antibody Screen: NEGATIVE

## 2011-07-07 LAB — OCCULT BLOOD X 1 CARD TO LAB, STOOL: Fecal Occult Bld: NEGATIVE

## 2011-07-07 LAB — CULTURE, BLOOD (ROUTINE X 2)
Culture: NO GROWTH
Culture: NO GROWTH

## 2011-07-07 LAB — URINALYSIS, ROUTINE W REFLEX MICROSCOPIC
Glucose, UA: NEGATIVE
Hgb urine dipstick: NEGATIVE
Ketones, ur: 15 — AB
Nitrite: NEGATIVE
Protein, ur: 100 — AB
Specific Gravity, Urine: 1.019
Urobilinogen, UA: 1
pH: 7

## 2011-07-07 LAB — BASIC METABOLIC PANEL
CO2: 31
Calcium: 7.7 — ABNORMAL LOW
Calcium: 8.4
Creatinine, Ser: 1.06
Creatinine, Ser: 1.88 — ABNORMAL HIGH
Glucose, Bld: 116 — ABNORMAL HIGH
Potassium: 3.1 — ABNORMAL LOW

## 2011-07-07 LAB — POCT I-STAT 3, ART BLOOD GAS (G3+)
Bicarbonate: 30.9 — ABNORMAL HIGH
Operator id: 141211
pH, Arterial: 7.43
pO2, Arterial: 127 — ABNORMAL HIGH

## 2011-07-07 LAB — DIFFERENTIAL
Basophils Absolute: 0.1
Basophils Relative: 1
Eosinophils Absolute: 0 — ABNORMAL LOW
Eosinophils Relative: 0
Lymphocytes Relative: 23
Lymphs Abs: 2.2
Monocytes Absolute: 1.4 — ABNORMAL HIGH
Monocytes Relative: 14 — ABNORMAL HIGH
Neutro Abs: 6.1
Neutrophils Relative %: 63

## 2011-07-07 LAB — URINE MICROSCOPIC-ADD ON

## 2011-07-07 LAB — URINE CULTURE
Colony Count: NO GROWTH
Culture: NO GROWTH

## 2011-07-25 ENCOUNTER — Emergency Department (HOSPITAL_COMMUNITY)
Admission: EM | Admit: 2011-07-25 | Discharge: 2011-07-26 | Disposition: A | Discharge status: Home / Self Care | Payer: Medicare Other | Attending: Emergency Medicine | Admitting: Emergency Medicine

## 2011-07-25 ENCOUNTER — Emergency Department (HOSPITAL_COMMUNITY): Payer: Medicare Other

## 2011-07-25 DIAGNOSIS — R358 Other polyuria: Secondary | ICD-10-CM | POA: Insufficient documentation

## 2011-07-25 DIAGNOSIS — R509 Fever, unspecified: Secondary | ICD-10-CM | POA: Insufficient documentation

## 2011-07-25 DIAGNOSIS — K219 Gastro-esophageal reflux disease without esophagitis: Secondary | ICD-10-CM | POA: Insufficient documentation

## 2011-07-25 DIAGNOSIS — R3589 Other polyuria: Secondary | ICD-10-CM | POA: Insufficient documentation

## 2011-07-25 DIAGNOSIS — Z79899 Other long term (current) drug therapy: Secondary | ICD-10-CM | POA: Insufficient documentation

## 2011-07-25 DIAGNOSIS — R0989 Other specified symptoms and signs involving the circulatory and respiratory systems: Secondary | ICD-10-CM | POA: Insufficient documentation

## 2011-07-25 DIAGNOSIS — R300 Dysuria: Secondary | ICD-10-CM | POA: Insufficient documentation

## 2011-07-25 DIAGNOSIS — E860 Dehydration: Secondary | ICD-10-CM | POA: Insufficient documentation

## 2011-07-25 DIAGNOSIS — I10 Essential (primary) hypertension: Secondary | ICD-10-CM | POA: Insufficient documentation

## 2011-07-25 DIAGNOSIS — M129 Arthropathy, unspecified: Secondary | ICD-10-CM | POA: Insufficient documentation

## 2011-07-25 DIAGNOSIS — D72829 Elevated white blood cell count, unspecified: Secondary | ICD-10-CM | POA: Insufficient documentation

## 2011-07-25 DIAGNOSIS — M069 Rheumatoid arthritis, unspecified: Secondary | ICD-10-CM | POA: Insufficient documentation

## 2011-07-25 LAB — BASIC METABOLIC PANEL
CO2: 27 mEq/L (ref 19–32)
Calcium: 10.2 mg/dL (ref 8.4–10.5)
Creatinine, Ser: 0.87 mg/dL (ref 0.50–1.35)
GFR calc non Af Amer: 79 mL/min — ABNORMAL LOW (ref >90)
Glucose, Bld: 107 mg/dL — ABNORMAL HIGH (ref 70–99)
Sodium: 134 mEq/L — ABNORMAL LOW (ref 135–145)

## 2011-07-25 LAB — URINALYSIS, ROUTINE W REFLEX MICROSCOPIC
Ketones, ur: 40 mg/dL — AB
Leukocytes, UA: NEGATIVE
Protein, ur: 100 mg/dL — AB
Urobilinogen, UA: 1 mg/dL (ref 0.0–1.0)

## 2011-07-25 LAB — CBC
Hemoglobin: 12.6 g/dL — ABNORMAL LOW (ref 13.0–17.0)
MCH: 29 pg (ref 26.0–34.0)
MCHC: 33.5 g/dL (ref 30.0–36.0)
MCV: 86.4 fL (ref 78.0–100.0)
RBC: 4.35 MIL/uL (ref 4.22–5.81)

## 2011-07-25 LAB — DIFFERENTIAL
Basophils Relative: 0 % (ref 0–1)
Eosinophils Absolute: 0 10*3/uL (ref 0.0–0.7)
Lymphs Abs: 1.1 10*3/uL (ref 0.7–4.0)
Monocytes Absolute: 0.8 10*3/uL (ref 0.1–1.0)
Monocytes Relative: 8 % (ref 3–12)
Neutro Abs: 9.1 10*3/uL — ABNORMAL HIGH (ref 1.7–7.7)

## 2011-07-25 LAB — URINE MICROSCOPIC-ADD ON

## 2011-07-27 LAB — URINE CULTURE: Culture: NO GROWTH

## 2011-10-12 ENCOUNTER — Encounter (HOSPITAL_COMMUNITY): Payer: Self-pay

## 2011-10-12 ENCOUNTER — Other Ambulatory Visit: Payer: Self-pay

## 2011-10-12 ENCOUNTER — Emergency Department (HOSPITAL_COMMUNITY): Payer: Medicare Other

## 2011-10-12 ENCOUNTER — Inpatient Hospital Stay (HOSPITAL_COMMUNITY)
Admission: EM | Admit: 2011-10-12 | Discharge: 2011-10-15 | DRG: 316 | Disposition: A | Discharge status: Home / Self Care | Payer: Medicare Other | Attending: Internal Medicine | Admitting: Internal Medicine

## 2011-10-12 DIAGNOSIS — R000 Tachycardia, unspecified: Secondary | ICD-10-CM

## 2011-10-12 DIAGNOSIS — R197 Diarrhea, unspecified: Secondary | ICD-10-CM | POA: Diagnosis present

## 2011-10-12 DIAGNOSIS — I959 Hypotension, unspecified: Principal | ICD-10-CM | POA: Diagnosis present

## 2011-10-12 DIAGNOSIS — J9859 Other diseases of mediastinum, not elsewhere classified: Secondary | ICD-10-CM | POA: Diagnosis present

## 2011-10-12 DIAGNOSIS — R55 Syncope and collapse: Secondary | ICD-10-CM

## 2011-10-12 DIAGNOSIS — I10 Essential (primary) hypertension: Secondary | ICD-10-CM | POA: Diagnosis present

## 2011-10-12 DIAGNOSIS — Z96659 Presence of unspecified artificial knee joint: Secondary | ICD-10-CM

## 2011-10-12 DIAGNOSIS — R112 Nausea with vomiting, unspecified: Secondary | ICD-10-CM | POA: Diagnosis present

## 2011-10-12 DIAGNOSIS — J984 Other disorders of lung: Secondary | ICD-10-CM | POA: Diagnosis present

## 2011-10-12 DIAGNOSIS — I498 Other specified cardiac arrhythmias: Secondary | ICD-10-CM | POA: Diagnosis present

## 2011-10-12 DIAGNOSIS — R911 Solitary pulmonary nodule: Secondary | ICD-10-CM | POA: Diagnosis present

## 2011-10-12 DIAGNOSIS — R7989 Other specified abnormal findings of blood chemistry: Secondary | ICD-10-CM | POA: Diagnosis present

## 2011-10-12 DIAGNOSIS — M069 Rheumatoid arthritis, unspecified: Secondary | ICD-10-CM | POA: Diagnosis present

## 2011-10-12 HISTORY — DX: Essential (primary) hypertension: I10

## 2011-10-12 HISTORY — DX: Unspecified osteoarthritis, unspecified site: M19.90

## 2011-10-12 LAB — COMPREHENSIVE METABOLIC PANEL
Albumin: 3.2 g/dL — ABNORMAL LOW (ref 3.5–5.2)
Alkaline Phosphatase: 83 U/L (ref 39–117)
BUN: 29 mg/dL — ABNORMAL HIGH (ref 6–23)
CO2: 27 mEq/L (ref 19–32)
Chloride: 103 mEq/L (ref 96–112)
Creatinine, Ser: 1.15 mg/dL (ref 0.50–1.35)
GFR calc non Af Amer: 58 mL/min — ABNORMAL LOW (ref >90)
Potassium: 4.4 mEq/L (ref 3.5–5.1)
Total Bilirubin: 0.7 mg/dL (ref 0.3–1.2)

## 2011-10-12 LAB — CBC
HCT: 43.4 % (ref 39.0–52.0)
Hemoglobin: 14.2 g/dL (ref 13.0–17.0)
RBC: 5 MIL/uL (ref 4.22–5.81)
WBC: 5.3 10*3/uL (ref 4.0–10.5)

## 2011-10-12 LAB — URINALYSIS, ROUTINE W REFLEX MICROSCOPIC
Hgb urine dipstick: NEGATIVE
Nitrite: NEGATIVE
Specific Gravity, Urine: 1.023 (ref 1.005–1.030)
Urobilinogen, UA: 0.2 mg/dL (ref 0.0–1.0)
pH: 5 (ref 5.0–8.0)

## 2011-10-12 LAB — CARDIAC PANEL(CRET KIN+CKTOT+MB+TROPI)
Relative Index: INVALID (ref 0.0–2.5)
Troponin I: <0.3 ng/mL (ref <0.30)

## 2011-10-12 LAB — PROTIME-INR
INR: 1.03 (ref 0.00–1.49)
Prothrombin Time: 13.7 seconds (ref 11.6–15.2)

## 2011-10-12 LAB — DIFFERENTIAL
Basophils Relative: 0 % (ref 0–1)
Lymphocytes Relative: 4 % — ABNORMAL LOW (ref 12–46)
Lymphs Abs: 0.2 10*3/uL — ABNORMAL LOW (ref 0.7–4.0)
Monocytes Absolute: 0.3 10*3/uL (ref 0.1–1.0)
Monocytes Relative: 6 % (ref 3–12)
Neutro Abs: 4.7 10*3/uL (ref 1.7–7.7)
Neutrophils Relative %: 88 % — ABNORMAL HIGH (ref 43–77)

## 2011-10-12 MED ORDER — IOHEXOL 300 MG/ML  SOLN
100.0000 mL | Freq: Once | INTRAMUSCULAR | Status: AC | PRN
  Administered 2011-10-12: 100 mL via INTRAVENOUS

## 2011-10-12 MED ORDER — SODIUM CHLORIDE 0.9 % IV BOLUS (SEPSIS)
500.0000 mL | Freq: Once | INTRAVENOUS | Status: AC
  Administered 2011-10-12: 500 mL via INTRAVENOUS

## 2011-10-12 MED ORDER — ONDANSETRON HCL 4 MG/2ML IJ SOLN
4.0000 mg | Freq: Once | INTRAMUSCULAR | Status: AC
  Administered 2011-10-12: 4 mg via INTRAVENOUS
  Filled 2011-10-12: qty 2

## 2011-10-12 MED ORDER — ASPIRIN 81 MG PO CHEW
324.0000 mg | CHEWABLE_TABLET | Freq: Once | ORAL | Status: AC
  Administered 2011-10-12: 324 mg via ORAL
  Filled 2011-10-12: qty 4

## 2011-10-12 NOTE — H&P (Signed)
[name] is an 78 y.o. male.   Chief Complaint: Almost passed out HPI: 78 yo brought in by daughter after having a near syncopal episode at home.  He was having some nausea prior to the event. Was found to have sinus tachycardia with some PVC's as well as Diarrhea in the ED. His Daughter had the Flu last week. No fever, no BRBPR, no melena. He felt weak afterwards but is now back to his baseline in the ED.  Past Medical History  Diagnosis Date  . Hypertension   . Arthritis     History reviewed. No pertinent past surgical history.  History reviewed. No pertinent family history. Social History:  does not have a smoking history on file. He does not have any smokeless tobacco history on file. His alcohol and drug histories not on file.  Allergies:  Allergies  Allergen Reactions  . Codeine     REACTION: drives me crazy  . Penicillins     REACTION: hives    Medications Prior to Admission  Medication Dose Route Frequency Provider Last Rate Last Dose  . aspirin chewable tablet 324 mg  324 mg Oral Once [facility] Wingen   324 mg at 10/12/11 1926  . iohexol (OMNIPAQUE) 300 MG/ML solution 100 mL  100 mL Intravenous Once PRN Medication Radiologist   100 mL at 10/12/11 2035  . ondansetron (ZOFRAN) injection 4 mg  4 mg Intravenous Once Pascal Lux Wingen   4 mg at 10/12/11 2141  . sodium chloride 0.9 % bolus 500 mL  500 mL Intravenous Once Glynn Octave, MD   500 mL at 10/12/11 1926  . sodium chloride 0.9 % bolus 500 mL  500 mL Intravenous Once Pascal Lux Wingen   500 mL at 10/12/11 2142   No current outpatient prescriptions on file as of 10/12/2011.    Results for orders placed during the hospital encounter of 10/12/11 (from the past 48 hour(s))  CBC     Status: Abnormal   Collection Time   10/12/11  6:15 PM      Component Value Range Comment   WBC 5.3  4.0 - 10.5 (K/uL)    RBC 5.00  4.22 - 5.81 (MIL/uL)    Hemoglobin 14.2  13.0 - 17.0 (g/dL)    HCT 78.2  95.6 - 21.3 (%)    MCV  86.8  78.0 - 100.0 (fL)    MCH 28.4  26.0 - 34.0 (pg)    MCHC 32.7  30.0 - 36.0 (g/dL)    RDW 08.6 (*) 57.8 - 15.5 (%)    Platelets 186  150 - 400 (K/uL)   DIFFERENTIAL     Status: Abnormal   Collection Time   10/12/11  6:15 PM      Component Value Range Comment   Neutrophils Relative 88 (*) 43 - 77 (%)    Neutro Abs 4.7  1.7 - 7.7 (K/uL)    Lymphocytes Relative 4 (*) 12 - 46 (%)    Lymphs Abs 0.2 (*) 0.7 - 4.0 (K/uL)    Monocytes Relative 6  3 - 12 (%)    Monocytes Absolute 0.3  0.1 - 1.0 (K/uL)    Eosinophils Relative 2  0 - 5 (%)    Eosinophils Absolute 0.1  0.0 - 0.7 (K/uL)    Basophils Relative 0  0 - 1 (%)    Basophils Absolute 0.0  0.0 - 0.1 (K/uL)   COMPREHENSIVE METABOLIC PANEL     Status: Abnormal  Collection Time   10/12/11  6:15 PM      Component Value Range Comment   Sodium 141  135 - 145 (mEq/L)    Potassium 4.4  3.5 - 5.1 (mEq/L)    Chloride 103  96 - 112 (mEq/L)    CO2 27  19 - 32 (mEq/L)    Glucose, Bld 149 (*) 70 - 99 (mg/dL)    BUN 29 (*) 6 - 23 (mg/dL)    Creatinine, Ser 9.60  0.50 - 1.35 (mg/dL)    Calcium 45.4  8.4 - 10.5 (mg/dL)    Total Protein 6.5  6.0 - 8.3 (g/dL)    Albumin 3.2 (*) 3.5 - 5.2 (g/dL)    AST 18  0 - 37 (U/L)    ALT 16  0 - 53 (U/L)    Alkaline Phosphatase 83  39 - 117 (U/L)    Total Bilirubin 0.7  0.3 - 1.2 (mg/dL)    GFR calc non Af Amer 58 (*) >90 (mL/min)    GFR calc Af Amer 67 (*) >90 (mL/min)   PROTIME-INR     Status: Normal   Collection Time   10/12/11  6:15 PM      Component Value Range Comment   Prothrombin Time 13.7  11.6 - 15.2 (seconds)    INR 1.03  0.00 - 1.49    CARDIAC PANEL(CRET KIN+CKTOT+MB+TROPI)     Status: Abnormal   Collection Time   10/12/11  6:15 PM      Component Value Range Comment   Total CK 89  7 - 232 (U/L)    CK, MB 4.8 (*) 0.3 - 4.0 (ng/mL)    Troponin I <0.30  <0.30 (ng/mL)    Relative Index RELATIVE INDEX IS INVALID  0.0 - 2.5    D-DIMER, QUANTITATIVE     Status: Abnormal   Collection Time    10/12/11  6:15 PM      Component Value Range Comment   D-Dimer, Quant 1.08 (*) 0.00 - 0.48 (ug/mL-FEU)   URINALYSIS, ROUTINE W REFLEX MICROSCOPIC     Status: Abnormal   Collection Time   10/12/11  8:13 PM      Component Value Range Comment   Color, Urine YELLOW  YELLOW     APPearance HAZY (*) CLEAR     Specific Gravity, Urine 1.023  1.005 - 1.030     pH 5.0  5.0 - 8.0     Glucose, UA NEGATIVE  NEGATIVE (mg/dL)    Hgb urine dipstick NEGATIVE  NEGATIVE     Bilirubin Urine SMALL (*) NEGATIVE     Ketones, ur NEGATIVE  NEGATIVE (mg/dL)    Protein, ur NEGATIVE  NEGATIVE (mg/dL)    Urobilinogen, UA 0.2  0.0 - 1.0 (mg/dL)    Nitrite NEGATIVE  NEGATIVE     Leukocytes, UA NEGATIVE  NEGATIVE  MICROSCOPIC NOT DONE ON URINES WITH NEGATIVE PROTEIN, BLOOD, LEUKOCYTES, NITRITE, OR GLUCOSE <1000 mg/dL.   Dg Chest 2 View  10/12/2011  *RADIOLOGY REPORT*  Clinical Data: Near-syncope.  CHEST - 2 VIEW  Comparison: Chest x-ray 07/25/2011.  Findings: The cardiac silhouette, mediastinal and hilar contours are within normal limits and stable.  Low lung volumes with vascular crowding and areas of atelectasis.  No infiltrates, edema or effusions.  The bony thorax is intact.  IMPRESSION: Low lung volumes with vascular crowding and atelectasis.  Original Report Authenticated By: P. Loralie Champagne, M.D.   Ct Angio Chest W/cm &/or Wo Cm  10/12/2011  *RADIOLOGY  REPORT*  Clinical Data: Sudden onset of dizziness and diaphoresis.  Assess for pulmonary embolism.  Chest pain.  CT ANGIOGRAPHY CHEST  Technique:  Multidetector CT imaging of the chest using the standard protocol during bolus administration of intravenous contrast. Multiplanar reconstructed images including MIPs were obtained and reviewed to evaluate the vascular anatomy.  Contrast:  100 ml Omnipaque 350  Comparison: Chest radiography same day.  Chest CT 04/26/2009.  Findings: Pulmonary arterial opacification is good.  There are no pulmonary emboli.  There is  atherosclerosis of the aorta without evidence of dissection.  There are now innumerable small pulmonary nodules scattered throughout the chest consistent with widespread metastatic disease. These are somewhat more numerous in the right lung than the left. The largest nodule is a 14 mm nodule the right lower lobe on image 43.  Most of the other nodules are 5 mm and smaller.  Pretracheal lymph nodes appear the same, the largest measuring 10 mm in diameter.  Aortopulmonary window lymph nodes appear the same measuring 10- 11 mm in diameter.  Peri carinal lymph nodes appear the same including a precarinal lymph node that measures 16 mm in diameter.  Right hilar lymph node appears the same measuring 19 mm in diameter.  No pleural or pericardial fluid.  In the upper abdomen, there is no adrenal mass.  The liver is not evaluated with contrast, but no definite lesions are seen.  There are wall calcifications of the gallbladder.  No evidence of spinal fracture or lytic spinal lesion.  There are degenerative changes throughout the thoracic spine.  IMPRESSION: No evidence of pulmonary emboli.  Marked increase in number of nodules throughout the lungs consistent with progression of metastatic disease.  Fairly stable pattern of lymphadenopathy in the mediastinum and right hilum.  Original Report Authenticated By: Thomasenia Sales, M.D.    Review of Systems  Constitutional: Negative.   HENT: Positive for hearing loss. Negative for ear pain, tinnitus and ear discharge.   Eyes: Negative.   Respiratory: Negative.   Cardiovascular: Positive for palpitations. Negative for orthopnea, claudication, leg swelling and PND.  Gastrointestinal: Positive for nausea and vomiting.  Genitourinary: Negative.   Musculoskeletal: Negative.   Skin: Negative.   Neurological: Negative.  Negative for headaches.  Endo/Heme/Allergies: Negative.   Psychiatric/Behavioral: Negative.     Blood pressure 103/56, pulse 101, temperature 98.2 F  (36.8 C), temperature source Oral, resp. rate 19, SpO2 93.00%. Physical Exam  Constitutional: He is oriented to person, place, and time. He appears well-developed and well-nourished.  HENT:  Head: Normocephalic and atraumatic.  Right Ear: External ear normal.  Left Ear: External ear normal.  Eyes: Conjunctivae and EOM are normal. Pupils are equal, round, and reactive to light.  Neck: Normal range of motion. Neck supple.  Cardiovascular: Intact distal pulses and normal pulses.  Tachycardia present.   Respiratory: Effort normal and breath sounds normal.  GI: Soft. Bowel sounds are normal.  Musculoskeletal: Normal range of motion.  Neurological: He is alert and oriented to person, place, and time. He has normal reflexes.  Skin: Skin is warm and dry.  Psychiatric: He has a normal mood and affect. His behavior is normal. Judgment and thought content normal.     Assessment/Plan 1. Presyncope: Probably mild dehydration from some mild Flu. Patient had Flu shot in October so he might not be manifesting the full symptom complex. His B;lood pressure was 90/52 and was not on any blood pressure medications. He had an episode of vomiting in ED. We  will admit for observation, hydrate, place on tele, check 2D Echo and if all is fine, DC tomorrow. 2. Hypotension: Hydrate 3. Nausea with vomiting: Treat symptomatically 4. RA: Continue home medications 5. S/P Bilateral TKR: Continue pain medications.  GARBA,LAWAL 10/12/2011, 11:21 PM

## 2011-10-12 NOTE — ED Provider Notes (Signed)
History     CSN: [id number]  Arrival date & time 10/12/11  1751   First MD Initiated Contact with Patient 10/12/11 1752      Chief Complaint  Patient presents with  . Near Syncope    (Consider location/radiation/quality/duration/timing/severity/associated sxs/prior treatment) HPI Comments: Patient is very agitated while obtaining history and did not want to answer questions.  However, he reports that began feeling dizzy at approximately 2pm today.  He did not have any syncopal episode.  He states that he is not feeling dizzy at this time.  He describes the dizziness as lightheadedness and not vertigo.  He also reports that he began having chest pain this evening.  The pain does not radiate.  The pain is not associated with any shortness of breath.  No fever or cough.  He did feel nauseous, but no vomiting.  He reports that his nausea has resolved.  He denies any cardiac or pulmonary history.    Patient fell one week ago and was evaluated in the ED in [place].  Patient and daughter report that he had a negative CT scan of his head at that time.  The history is provided by the patient.    Past Medical History  Diagnosis Date  . Hypertension   . Arthritis     History reviewed. No pertinent past surgical history.  History reviewed. No pertinent family history.  History  Substance Use Topics  . Smoking status: Not on file  . Smokeless tobacco: Not on file  . Alcohol Use:       Review of Systems  Constitutional: Negative for fever and chills.  HENT: Negative for neck pain and neck stiffness.   Eyes: Negative for visual disturbance.  Respiratory: Negative for shortness of breath and wheezing.   Cardiovascular: Negative for chest pain.  Gastrointestinal: Positive for nausea. Negative for vomiting, abdominal pain and diarrhea.  Skin: Negative for rash.  Neurological: Positive for dizziness. Negative for syncope, weakness and headaches.  Psychiatric/Behavioral: Negative  for confusion.    Allergies  Codeine and Penicillins  Home Medications   Current Outpatient Rx  Name Route Sig Dispense Refill  . ALBUTEROL SULFATE (2.5 MG/3ML) 0.083% IN NEBU Nebulization Take 2.5 mg by nebulization every 4 (four) hours as needed. For shortness of breath    . HYDROCODONE-ACETAMINOPHEN 5-500 MG PO TABS Oral Take 1 tablet by mouth 2 (two) times daily as needed. For pain    . METAXALONE 800 MG PO TABS Oral Take 800 mg by mouth 2 (two) times daily.    Marland Kitchen TAMSULOSIN HCL 0.4 MG PO CAPS Oral Take 0.4 mg by mouth at bedtime.    . TRAMADOL HCL 50 MG PO TABS Oral Take 50 mg by mouth every 8 (eight) hours as needed. For pain      BP 102/66  Pulse 113  Temp(Src) 97.7 F (36.5 C) (Oral)  Resp 20  SpO2 91%  Physical Exam  Nursing note and vitals reviewed. Constitutional: He is oriented to person, place, and time. He appears well-developed and well-nourished.  HENT:  Head: Normocephalic and atraumatic.  Eyes: EOM are normal. Pupils are equal, round, and reactive to light.  Neck: Normal range of motion. Neck supple.  Cardiovascular: Normal rate, regular rhythm and normal heart sounds.   Pulmonary/Chest: Effort normal and breath sounds normal. No respiratory distress. He has no wheezes.  Abdominal: Soft. Bowel sounds are normal. He exhibits no distension and no mass. There is no tenderness. There is no rebound and no  guarding.  Musculoskeletal: Normal range of motion.       Patient has a prosthetic leg of both legs below the knee.  Neurological: He is alert and oriented to person, place, and time. He has normal strength. No cranial nerve deficit. He displays a negative Romberg sign.  Skin: Skin is warm.  Psychiatric: He has a normal mood and affect.    ED Course  Procedures (including critical care time)   Labs Reviewed  CBC  DIFFERENTIAL  COMPREHENSIVE METABOLIC PANEL  PROTIME-INR  CARDIAC PANEL(CRET KIN+CKTOT+MB+TROPI)  URINALYSIS, ROUTINE W REFLEX MICROSCOPIC    D-DIMER, QUANTITATIVE   No results found.   No diagnosis found.   Date: 10/12/2011  Rate: 110  Rhythm: sinus tachycardia  QRS Axis: right  Intervals: normal  ST/T Wave abnormalities: nonspecific ST changes and nonspecific T wave changes  Conduction Disutrbances:none  Narrative Interpretation:  T wave inversion in AVL.  PVC's present.  Old EKG Reviewed: changes noted  6:56 PM Reassessed patient.  Patient reports that he is not having any chest pain.  Denies nausea.  Denies any dizziness  10:00 PM Patient is vomiting.  Will order IV Zofran.  Patient is still tachycardic.  Current HR is 110 bpm  Discussed patient with Dr. Mikeal Hawthorne who reports that he will admit patient.  MDM  Patient comes in today with pre syncope, nausea, and chest pain.  He is tachycardic.  His troponin is negative.  EKG showing nonspecific changes.  No ST elevation or depression.  CT angiogram of his chest is negative for PE.  CT did show pulmonary nodules, but family reports that those have been present in the past and have been biopsied and have been found to be non cancerous.  Will admit patient for further work up of tachycardia and pre syncope.         Pascal Lux Wingen 10/13/11 0110

## 2011-10-12 NOTE — ED Notes (Signed)
Received report assumed patient care, walking rounds completed. 

## 2011-10-12 NOTE — ED Notes (Signed)
Pt was at work this afternoon when he had onset of dizziness, diaphoresis, and nausea. He had to sit down in order to avoid passing out. On ems arrival pt was alert and oriented although slightly pale. Pt states that upon ems arrival his nausea had resolved but he still felt light headed but has continued to feel that way. One week ago he stated that he fell and hit his head and was seen and no injury was found at that point in time. Pt denies any loc with the initial injury.

## 2011-10-13 ENCOUNTER — Encounter (HOSPITAL_COMMUNITY): Payer: Self-pay | Admitting: *Deleted

## 2011-10-13 LAB — BASIC METABOLIC PANEL
BUN: 31 mg/dL — ABNORMAL HIGH (ref 6–23)
Calcium: 8.7 mg/dL (ref 8.4–10.5)
Chloride: 107 mEq/L (ref 96–112)
Creatinine, Ser: 0.99 mg/dL (ref 0.50–1.35)
GFR calc Af Amer: 87 mL/min — ABNORMAL LOW (ref >90)
GFR calc Af Amer: >90 mL/min (ref >90)
GFR calc non Af Amer: 75 mL/min — ABNORMAL LOW (ref >90)
GFR calc non Af Amer: 78 mL/min — ABNORMAL LOW (ref >90)
Potassium: 3.6 mEq/L (ref 3.5–5.1)

## 2011-10-13 LAB — CBC
MCH: 28.3 pg (ref 26.0–34.0)
MCHC: 32.2 g/dL (ref 30.0–36.0)
MCHC: 32 g/dL (ref 30.0–36.0)
MCV: 88 fL (ref 78.0–100.0)
Platelets: 175 10*3/uL (ref 150–400)
Platelets: 160 10*3/uL (ref 150–400)
RBC: 4.34 MIL/uL (ref 4.22–5.81)
RDW: 16.4 % — ABNORMAL HIGH (ref 11.5–15.5)
WBC: 4.9 10*3/uL (ref 4.0–10.5)

## 2011-10-13 LAB — CARDIAC PANEL(CRET KIN+CKTOT+MB+TROPI)
Relative Index: INVALID (ref 0.0–2.5)
Relative Index: INVALID (ref 0.0–2.5)
Total CK: 58 U/L (ref 7–232)

## 2011-10-13 LAB — TSH: TSH: 1.033 u[IU]/mL (ref 0.350–4.500)

## 2011-10-13 LAB — CREATININE, SERUM: Creatinine, Ser: 1.19 mg/dL (ref 0.50–1.35)

## 2011-10-13 MED ORDER — METAXALONE 800 MG PO TABS
800.0000 mg | ORAL_TABLET | Freq: Two times a day (BID) | ORAL | Status: DC
  Administered 2011-10-13: 800 mg via ORAL
  Filled 2011-10-13 (×2): qty 1

## 2011-10-13 MED ORDER — TAMSULOSIN HCL 0.4 MG PO CAPS
0.4000 mg | ORAL_CAPSULE | Freq: Every day | ORAL | Status: DC
  Administered 2011-10-13 – 2011-10-14 (×3): 0.4 mg via ORAL
  Filled 2011-10-13 (×5): qty 1

## 2011-10-13 MED ORDER — TRAMADOL HCL 50 MG PO TABS
50.0000 mg | ORAL_TABLET | Freq: Four times a day (QID) | ORAL | Status: DC | PRN
  Administered 2011-10-13: 50 mg via ORAL
  Filled 2011-10-13: qty 1

## 2011-10-13 MED ORDER — ACETAMINOPHEN 325 MG PO TABS
650.0000 mg | ORAL_TABLET | Freq: Four times a day (QID) | ORAL | Status: DC | PRN
  Administered 2011-10-14 (×2): 650 mg via ORAL
  Filled 2011-10-13 (×2): qty 2

## 2011-10-13 MED ORDER — ONDANSETRON HCL 4 MG PO TABS: 4.0000 mg | ORAL_TABLET | Freq: Four times a day (QID) | ORAL | Status: DC | PRN

## 2011-10-13 MED ORDER — ALBUTEROL SULFATE (5 MG/ML) 0.5% IN NEBU: 2.5000 mg | INHALATION_SOLUTION | RESPIRATORY_TRACT | Status: DC | PRN

## 2011-10-13 MED ORDER — SODIUM CHLORIDE 0.9 % IV SOLN
INTRAVENOUS | Status: DC
  Administered 2011-10-13 – 2011-10-14 (×2): via INTRAVENOUS

## 2011-10-13 MED ORDER — LOPERAMIDE HCL 2 MG PO CAPS
2.0000 mg | ORAL_CAPSULE | Freq: Three times a day (TID) | ORAL | Status: DC | PRN
  Administered 2011-10-13 – 2011-10-14 (×2): 2 mg via ORAL
  Filled 2011-10-13 (×3): qty 1

## 2011-10-13 MED ORDER — ALBUTEROL SULFATE (5 MG/ML) 0.5% IN NEBU: 2.5000 mg | INHALATION_SOLUTION | Freq: Four times a day (QID) | RESPIRATORY_TRACT | Status: DC

## 2011-10-13 MED ORDER — ENOXAPARIN SODIUM 40 MG/0.4ML ~~LOC~~ SOLN
40.0000 mg | Freq: Every day | SUBCUTANEOUS | Status: DC
  Administered 2011-10-13 – 2011-10-14 (×2): 40 mg via SUBCUTANEOUS
  Filled 2011-10-13 (×3): qty 0.4

## 2011-10-13 MED ORDER — HYDROCODONE-ACETAMINOPHEN 5-325 MG PO TABS
1.0000 | ORAL_TABLET | ORAL | Status: DC | PRN
  Administered 2011-10-13 (×2): 1 via ORAL
  Filled 2011-10-13 (×2): qty 1

## 2011-10-13 MED ORDER — ONDANSETRON HCL 4 MG/2ML IJ SOLN: 4.0000 mg | Freq: Four times a day (QID) | INTRAMUSCULAR | Status: DC | PRN

## 2011-10-13 MED ORDER — ACETAMINOPHEN 650 MG RE SUPP: 650.0000 mg | Freq: Four times a day (QID) | RECTAL | Status: DC | PRN

## 2011-10-13 NOTE — Progress Notes (Signed)
  Echocardiogram 2D Echocardiogram has been performed.  [name] 10/13/2011, 12:40 PM

## 2011-10-13 NOTE — Progress Notes (Signed)
Physical Therapy Evaluation Patient Details Name: [name] MRN: [id number] DOB: 1928/09/04 Today's Date: 10/13/2011  Problem List:  Patient Active Problem List  Diagnoses  . HYPERTENSION  . PULMONARY NODULE  . OTHER DISEASES OF MEDIASTINUM NEC  . RHEUMATOID ARTHRITIS  . Pre-syncope  . Hypotension  . Nausea & vomiting    Past Medical History:  Past Medical History  Diagnosis Date  . Hypertension   . Arthritis   . Ex-smoker    Past Surgical History: History reviewed. No pertinent past surgical history.  PT Assessment/Plan/Recommendation PT Assessment Clinical Impression Statement: Pt presents with dizziness and tachycardia. Pt with significant balance deficits scoring a 32 on Berg placing patient as a high fall risk. Pt will benefit from therapy to address balance deficits and maximize function. PT Recommendation/Assessment: Patient will need skilled PT in the acute care venue PT Problem List: Decreased balance PT Therapy Diagnosis : Abnormality of gait PT Plan PT Frequency: Min 3X/week PT Treatment/Interventions: Gait training;Functional mobility training;Therapeutic activities;Therapeutic exercise;Balance training;Patient/family education PT Recommendation Follow Up Recommendations: Outpatient PT Equipment Recommended: None recommended by PT PT Goals  Acute Rehab PT Goals PT Goal Formulation: With patient Time For Goal Achievement: 7 days Pt will Ambulate: >150 feet;with modified independence;with gait velocity >(comment) ft/second;with least restrictive assistive device (2.42ft/sec) PT Goal: Ambulate - Progress: Other (comment) Additional Goals Additional Goal #1: Pt will score greater than 46 on Berg to decrease fall risk PT Goal: Additional Goal #1 - Progress: Other (comment)  PT Evaluation Precautions/Restrictions  Precautions Precautions: Fall Restrictions Weight Bearing Restrictions: No Prior Functioning  Home Living Lives With: Daughter Type of  Home: House Home Layout: Two level;Able to live on main level with bedroom/bathroom Home Access: Stairs to enter Entrance Stairs-Rails: Left Entrance Stairs-Number of Steps: 3 Bathroom Shower/Tub: Engineer, manufacturing systems: Standard Home Adaptive Equipment: Straight cane Prior Function Level of Independence: Independent with basic ADLs;Independent with transfers;Independent with gait;Independent with homemaking with ambulation Driving: Yes Vocation: Part time employment Comments: Pt is a Chiropractor. 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 Pt had a fall one week ago hitting his head. Pt uses a cane when he goes outside to feed his goats. Cognition Cognition Arousal/Alertness: Awake/alert Overall Cognitive Status: Appears within functional limits for tasks assessed Orientation Level: Oriented X4 Sensation/Coordination Sensation Light Touch: Appears Intact Extremity Assessment RLE Assessment RLE Assessment: Within Functional Limits LLE Assessment LLE Assessment: Within Functional Limits Mobility (including Balance) Bed Mobility Bed Mobility: Yes Supine to Sit: 6: Modified independent (Device/Increase time) Sitting - Scoot to Edge of Bed: 6: Modified independent (Device/Increase time) Sit to Supine: 6: Modified independent (Device/Increase time) Transfers Transfers: Yes Sit to Stand: 6: Modified independent (Device/Increase time) Stand to Sit: 6: Modified independent (Device/Increase time) Stand Pivot Transfers: 6: Modified independent (Device/Increase time) Ambulation/Gait Ambulation/Gait: Yes Ambulation/Gait Assistance: 6: Modified independent (Device/Increase time) Ambulation Distance (Feet): 400 Feet Assistive device: None Gait Pattern: Decreased stride length;Antalgic Gait velocity: 30/20=1.5 ft/sec gait which places pt at increased fall risk Stairs: Yes Stairs Assistance: 6: Modified independent (Device/Increase time) Number of Stairs: 3  Height of Stairs: 8     Posture/Postural Control Posture/Postural Control: No significant limitations Balance Balance Assessed: Yes Berg Balance Test Sit to Stand: Able to stand without using hands and stabilize independently Standing Unsupported: Able to stand safely 2 minutes Sitting with Back Unsupported but Feet Supported on Floor or Stool: Able to sit safely and securely 2 minutes Stand to Sit: Sits safely with minimal use of hands Transfers: Able to  transfer safely, definite need of hands Standing Unsupported with Eyes Closed: Able to stand 3 seconds Standing Ubsupported with Feet Together: Able to place feet together independently but unable to hold for 30 seconds From Standing, Reach Forward with Outstretched Arm: Can reach forward >5 cm safely (2") From Standing Position, Pick up Object from Floor: Able to pick up shoe, needs supervision From Standing Position, Turn to Look Behind Over each Shoulder: Turn sideways only but maintains balance Turn 360 Degrees: Able to turn 360 degrees safely but slowly Standing Unsupported, Alternately Place Feet on Step/Stool: Needs assistance to keep from falling or unable to try Standing Unsupported, One Foot in Front: Loses balance while stepping or standing Standing on One Leg: Unable to try or needs assist to prevent fall Total Score: 32  Exercise    End of Session PT - End of Session Activity Tolerance: Patient tolerated treatment well Patient left: in bed;with call bell in reach;with bed alarm set Nurse Communication: Mobility status for ambulation;Mobility status for transfers General Behavior During Session: [facility] for tasks performed Cognition: [facility] for tasks performed  Delorse Lek 10/13/2011, 9:45 AM Toney Sang, PT [phone]

## 2011-10-13 NOTE — ED Provider Notes (Signed)
Medical screening examination/treatment/procedure(s) were conducted as a shared visit with non-physician practitioner(s) and myself.  I personally evaluated the patient during the encounter  Lightheadedness, near syncope, nausea with fleeting chest pain.  Fell at hit head 1 week ago but had negative CT.  Sinus tach with frequent PVCs.  Recent flu exposure.  Glynn Octave, MD 10/13/11 1029

## 2011-10-13 NOTE — Progress Notes (Signed)
[facility] progress note    Subjective: States has been having diarrhoea.  N/v has stopped.  NO cp/sob/blurred vivios.  Want sot go home.  understadns work-up still in process   Objective: Vital signs in last 24 hours: Temp:  [97.5 F (36.4 C)-98.4 F (36.9 C)] 97.5 F (36.4 C) (01/15 1357) Pulse Rate:  [84-123] 84  (01/15 1357) Resp:  [18-27] 20  (01/15 1357) BP: (90-129)/(50-76) 113/68 mmHg (01/15 1357) SpO2:  [89 %-99 %] 90 % (01/15 1357) Weight:  [74 kg (163 lb 2.3 oz)] 74 kg (163 lb 2.3 oz) (01/15 0142) Weight change:   Intake/Output Summary (Last 24 hours) at 10/13/11 1523 Last data filed at 10/13/11 1200  Gross per 24 hour  Intake    120 ml  Output    500 ml  Net   -380 ml    BP 113/68  Pulse 84  Temp(Src) 97.5 F (36.4 C) (Oral)  Resp 20  Ht 5\' 5"  (1.651 m)  Wt 74 kg (163 lb 2.3 oz)  BMI 27.15 kg/m2  SpO2 90% General appearance: alert, cooperative and appears stated age Head: Normocephalic, without obvious abnormality, atraumatic Lungs: clear to auscultation bilaterally and normal percussion bilaterally Heart: S1, S2 normal and systolic murmur: early systolic 3/6, medium pitch at 2nd left intercostal space, at 2nd right intercostal space Extremities: extremities normal, atraumatic, no cyanosis or edema Pulses: 2+ and symmetric Neurologic: Grossly normal  Lab Results:  Basename 10/13/11 0515 10/13/11 0238 10/12/11 1815  NA 141 -- 141  K 3.6 -- 4.4  CL 108 -- 103  CO2 24 -- 27  GLUCOSE 94 -- 149*  BUN 31* -- 29*  CREATININE 0.99 1.19 --  CALCIUM 8.7 -- 10.1  MG -- -- --  PHOS -- -- --    Basename 10/12/11 1815  AST 18  ALT 16  ALKPHOS 83  BILITOT 0.7  PROT 6.5  ALBUMIN 3.2*   No results found for this basename: LIPASE:2,AMYLASE:2 in the last 72 hours  Basename 10/13/11 0515 10/13/11 0238 10/12/11 1815  WBC 4.9 5.0 --  NEUTROABS -- -- 4.7  HGB 11.9* 12.3* --  HCT 37.2* 38.2* --  MCV 87.3 88.0 --  PLT 160 175 --     Basename 10/13/11 0810 10/12/11 1815  CKTOTAL 58 89  CKMB 3.1 4.8*  CKMBINDEX -- --  TROPONINI <0.30 <0.30   No components found with this basename: POCBNP:3  Basename 10/12/11 1815  DDIMER 1.08*   No results found for this basename: HGBA1C:2 in the last 72 hours No results found for this basename: CHOL:2,HDL:2,LDLCALC:2,TRIG:2,CHOLHDL:2,LDLDIRECT:2 in the last 72 hours  Basename 10/13/11 0238  TSH 1.033  T4TOTAL --  T3FREE --  THYROIDAB --   No results found for this basename: VITAMINB12:2,FOLATE:2,FERRITIN:2,TIBC:2,IRON:2,RETICCTPCT:2 in the last 72 hours Micro Results:   Medications:  I have reviewed the patient's current medications. Prior to Admission:  Prescriptions prior to admission  Medication Sig Dispense Refill  . albuterol (PROVENTIL) (2.5 MG/3ML) 0.083% nebulizer solution Take 2.5 mg by nebulization every 4 (four) hours as needed. For shortness of breath      . HYDROcodone-acetaminophen (VICODIN) 5-500 MG per tablet Take 1 tablet by mouth 2 (two) times daily as needed. For pain      . metaxalone (SKELAXIN) 800 MG tablet Take 800 mg by mouth 2 (two) times daily.      . Tamsulosin HCl (FLOMAX) 0.4 MG CAPS Take 0.4 mg by mouth at bedtime.      . traMADol Janean Sark)  50 MG tablet Take 50 mg by mouth every 8 (eight) hours as needed. For pain       Scheduled Meds:    . aspirin  324 mg Oral Once  . enoxaparin  40 mg Subcutaneous Daily  . ondansetron (ZOFRAN) IV  4 mg Intravenous Once  . sodium chloride  500 mL Intravenous Once  . sodium chloride  500 mL Intravenous Once  . Tamsulosin HCl  0.4 mg Oral QHS  . DISCONTD: albuterol  2.5 mg Nebulization Q6H  . DISCONTD: metaxalone  800 mg Oral BID   Continuous Infusions:   . sodium chloride 125 mL/hr at 10/13/11 0758   PRN Meds:.acetaminophen, acetaminophen, albuterol, HYDROcodone-acetaminophen, iohexol, ondansetron (ZOFRAN) IV, ondansetron, traMADol  Assessment/Plan:  Patient Active Problem List  Diagnoses   . HYPOTENSION + Pre-syncope-await echo.  Findings on labs suspicious for pre-renal azotemia 22 to n/v/d-doesn't sound like CDIFF, buit will monitor-if active stool and worse would work-up.  Last set of enzymes for r/o Pending as well Wean IVf to 50 cc/hr  . PULMONARY NODULE-PET negative-Per Dr. Marchelle Gearing as an outpatient  . Mediastinal Histoplasmosis-pr Pulmonary  . Rheumatoid arthritis-Continue prednisone  . Nausea & vomiting-resolved     . Azotemia-2/2 to possibly Metaxalone and Tramadol-would hold both and use Tylenol ibn prefernece for his LBP      LOS: 1 day   Alayzha An,JAI 10/13/2011, 3:23 PM

## 2011-10-13 NOTE — ED Notes (Signed)
Pt & family extremely upset about wait & process, wait process & plan explained. Concerns and complaints adressesed.

## 2011-10-14 DIAGNOSIS — R197 Diarrhea, unspecified: Secondary | ICD-10-CM | POA: Diagnosis present

## 2011-10-14 LAB — BASIC METABOLIC PANEL
CO2: 21 mEq/L (ref 19–32)
Calcium: 8.7 mg/dL (ref 8.4–10.5)
Chloride: 109 mEq/L (ref 96–112)
Creatinine, Ser: 0.82 mg/dL (ref 0.50–1.35)
GFR calc Af Amer: >90 mL/min (ref >90)
Sodium: 138 mEq/L (ref 135–145)

## 2011-10-14 LAB — CARDIAC PANEL(CRET KIN+CKTOT+MB+TROPI)
CK, MB: 4 ng/mL (ref 0.3–4.0)
Relative Index: INVALID (ref 0.0–2.5)
Total CK: 53 U/L (ref 7–232)
Troponin I: <0.3 ng/mL (ref <0.30)

## 2011-10-14 LAB — MAGNESIUM: Magnesium: 1.7 mg/dL (ref 1.5–2.5)

## 2011-10-14 LAB — CLOSTRIDIUM DIFFICILE BY PCR: Toxigenic C. Difficile by PCR: NEGATIVE

## 2011-10-14 MED ORDER — POTASSIUM CHLORIDE CRYS ER 20 MEQ PO TBCR
40.0000 meq | EXTENDED_RELEASE_TABLET | Freq: Three times a day (TID) | ORAL | Status: AC
  Administered 2011-10-14 (×3): 40 meq via ORAL
  Filled 2011-10-14 (×3): qty 2

## 2011-10-14 MED ORDER — HYDRALAZINE HCL 20 MG/ML IJ SOLN
5.0000 mg | Freq: Three times a day (TID) | INTRAMUSCULAR | Status: DC | PRN
  Administered 2011-10-14: 5 mg via INTRAVENOUS
  Filled 2011-10-14: qty 0.25

## 2011-10-14 NOTE — Progress Notes (Signed)
Subjective: Patient relates he still having diarrhea, ins and outs show 9 stool outputs. He relates they're still watery. Is able to tolerate his diet. Objective: Filed Vitals:   10/13/11 1357 10/13/11 2148 10/14/11 0554 10/14/11 0556  BP: 113/68 122/68 142/76 156/92  Pulse: 84 89 89 95  Temp: 97.5 F (36.4 C) 97.2 F (36.2 C) 97.8 F (36.6 C)   TempSrc: Oral Oral Oral   Resp: 20 18 18    Height:      Weight:      SpO2: 90% 91% 92%    Weight change:   Intake/Output Summary (Last 24 hours) at 10/14/11 0838 Last data filed at 10/13/11 2000  Gross per 24 hour  Intake    480 ml  Output      0 ml  Net    480 ml    General: Alert, awake, oriented x3, in no acute distress.  HEENT: No bruits, no goiter.  Heart: Regular rate and rhythm, without murmurs, rubs, gallops.  Lungs: Good air movement and clear to auscultation. Abdomen: Soft, mildly sore on his epigastric and left lower quadrant, nondistended, positive bowel sounds.  Neuro: Grossly intact, nonfocal.   Lab Results:  Basename 10/14/11 0615 10/13/11 1537 10/13/11 0515 10/13/11 0238 10/12/11 1815  NA 138 140 141 -- 141  K 3.3* 3.6 3.6 -- 4.4  CL 109 107 108 -- 103  CO2 21 24 24  -- 27  GLUCOSE 82 87 94 -- 149*  BUN 21 29* 31* -- 29*  CREATININE 0.82 0.89 0.99 1.19 1.15  CALCIUM 8.7 8.6 8.7 -- 10.1  MG -- -- -- -- --  PHOS -- -- -- -- --    Basename 10/12/11 1815  AST 18  ALT 16  ALKPHOS 83  BILITOT 0.7  PROT 6.5  ALBUMIN 3.2*    Basename 10/13/11 0515 10/13/11 0238 10/12/11 1815  WBC 4.9 5.0 --  NEUTROABS -- -- 4.7  HGB 11.9* 12.3* --  HCT 37.2* 38.2* --  MCV 87.3 88.0 --  PLT 160 175 --    Basename 10/13/11 2347 10/13/11 1537 10/13/11 0810  CKTOTAL 53 60 58  CKMB 4.0 3.5 3.1  CKMBINDEX -- -- --  TROPONINI <0.30 <0.30 <0.30    Basename 10/12/11 1815  DDIMER 1.08*    Basename 10/13/11 0238  TSH 1.033  T4TOTAL --  T3FREE --  THYROIDAB --     Micro Results: No results found for this or  any previous visit (from the past 240 hour(s)).  Studies/Results: Dg Chest 2 View  10/12/2011  *RADIOLOGY REPORT*  Clinical Data: Near-syncope.  CHEST - 2 VIEW  Comparison: Chest x-ray 07/25/2011.  Findings: The cardiac silhouette, mediastinal and hilar contours are within normal limits and stable.  Low lung volumes with vascular crowding and areas of atelectasis.  No infiltrates, edema or effusions.  The bony thorax is intact.  IMPRESSION: Low lung volumes with vascular crowding and atelectasis.  Original Report Authenticated By: P. Loralie Champagne, M.D.   Ct Angio Chest W/cm &/or Wo Cm  10/12/2011  *RADIOLOGY REPORT*  Clinical Data: Sudden onset of dizziness and diaphoresis.  Assess for pulmonary embolism.  Chest pain.  CT ANGIOGRAPHY CHEST  Technique:  Multidetector CT imaging of the chest using the standard protocol during bolus administration of intravenous contrast. Multiplanar reconstructed images including MIPs were obtained and reviewed to evaluate the vascular anatomy.  Contrast:  100 ml Omnipaque 350  Comparison: Chest radiography same day.  Chest CT 04/26/2009.  Findings: Pulmonary arterial opacification  is good.  There are no pulmonary emboli.  There is atherosclerosis of the aorta without evidence of dissection.  There are now innumerable small pulmonary nodules scattered throughout the chest consistent with widespread metastatic disease. These are somewhat more numerous in the right lung than the left. The largest nodule is a 14 mm nodule the right lower lobe on image 43.  Most of the other nodules are 5 mm and smaller.  Pretracheal lymph nodes appear the same, the largest measuring 10 mm in diameter.  Aortopulmonary window lymph nodes appear the same measuring 10- 11 mm in diameter.  Peri carinal lymph nodes appear the same including a precarinal lymph node that measures 16 mm in diameter.  Right hilar lymph node appears the same measuring 19 mm in diameter.  No pleural or pericardial fluid.   In the upper abdomen, there is no adrenal mass.  The liver is not evaluated with contrast, but no definite lesions are seen.  There are wall calcifications of the gallbladder.  No evidence of spinal fracture or lytic spinal lesion.  There are degenerative changes throughout the thoracic spine.  IMPRESSION: No evidence of pulmonary emboli.  Marked increase in number of nodules throughout the lungs consistent with progression of metastatic disease.  Fairly stable pattern of lymphadenopathy in the mediastinum and right hilum.  Original Report Authenticated By: Thomasenia Sales, M.D.   Echo: Left ventricle: The cavity size was normal. Systolic function was normal. The estimated ejection fraction was in the range of 60% to 65%. Wall motion was normal; there were no regional wall motion abnormalities. There was an increased relative contribution of atrial contraction to ventricular filling, which may be due to aging or hypovolemia.   Medications: I have reviewed the patient's current medications.   Principal Problem:  1*Pre-syncope: Cardiac enzymes x3 no events on telemetry. The most likely cause of this is probably has hypotension secondary to the use of blood pressure medications and ongoing diarrhea. We'll go ahead and check a C. difficile. He doesn't have a white count doesn't have a fever. His creatinine is improving. The most likely cause of his diarrhea would be viral as well as family members had flulike symptoms. If C. difficile negative could probably go home.  Active Problems: 2.PULMONARY NODULE-PET negative: To follow up with his pulmonologist.  3. Rheumatoid arthritis: Avoid NSAIDs.  4. Hypotension: Resolved. We'll continue to hold diuretics. His blood pressures trending up. If it continues to go above 180 we'll use hydralazine when necessary.  5.Nausea & vomiting : Resolved    Assessment and plan:   LOS: 2 days   Marinda Elk M.D. Pager: [phone] [facility] 10/14/2011, 8:38 AM

## 2011-10-14 NOTE — Clinical Documentation Improvement (Signed)
GENERIC DOCUMENTATION CLARIFICATION QUERY  THIS DOCUMENT IS NOT A PERMANENT PART OF THE MEDICAL RECORD  TO RESPOND TO THE THIS QUERY, FOLLOW THE INSTRUCTIONS BELOW:  1. If needed, update documentation for the patient's encounter via the notes activity.  2. Access this query again and click edit on the In [employer].  3. After updating, or not, click F2 to complete all highlighted (required) fields concerning your review. Select "additional documentation in the medical record" OR "no additional documentation provided".  4. Click Sign note button.  5. The deficiency will fall out of your In Basket *Please let us know if you are not able to complete this workflow by phone or e-mail (listed below).  Please update your documentation within the medical record to reflect your response to this query.                                                                                        10/14/11   Dear Dr.Feliz-Ortiz / [facility],  In a better effort to capture your patient's severity of illness, reflect appropriate length of stay and utilization of resources, a review of the patient medical record has revealed the following indicators.    Based on your clinical judgment, please clarify and document in a progress note and/or discharge summary the clinical condition associated with the following supporting information:   Possible Clinical Conditions?  _______Histoplasmosis Pericarditis  _______Histoplasmosis Endocarditis _______Other Condition__________________ ____x___Cannot Clinically Determine    Supporting Information:  Risk Factors:    Mediastinal histoplasmosis noted per 1/15 progress note.   You may use possible, probable, or suspect with inpatient documentation. possible, probable, suspected diagnoses MUST be documented at the time of discharge  Reviewed:  no additional documentation provided  Thank You,  Marciano Sequin,  Clinical Documentation Specialist:  Pager: [phone]  Health Information Management [place]

## 2011-10-15 NOTE — Progress Notes (Signed)
Subjective: Patient relates he still having diarrhea, ins and outs show 9 stool outputs. He relates they're still watery. Is able to tolerate his diet.  Objective: Filed Vitals:   10/15/11 0100 10/15/11 0214 10/15/11 0505 10/15/11 0604  BP: 177/74 134/78 164/68 154/82  Pulse:   78   Temp:   97.3 F (36.3 C)   TempSrc:   Oral   Resp:   20   Height:      Weight:      SpO2:   95%    Weight change:   Intake/Output Summary (Last 24 hours) at 10/15/11 0752 Last data filed at 10/15/11 0449  Gross per 24 hour  Intake    240 ml  Output    225 ml  Net     15 ml    General: Alert, awake, oriented x3, in no acute distress.  HEENT: No bruits, no goiter.  Heart: Regular rate and rhythm, without murmurs, rubs, gallops.  Lungs: Good air movement and clear to auscultation. Abdomen: Soft, mildly sore on his epigastric and left lower quadrant, nondistended, positive bowel sounds.  Neuro: Grossly intact, nonfocal.   Lab Results:  Basename 10/14/11 0615 10/13/11 1537 10/13/11 0515 10/13/11 0238 10/12/11 1815  NA 138 140 141 -- 141  K 3.3* 3.6 3.6 -- 4.4  CL 109 107 108 -- 103  CO2 21 24 24  -- 27  GLUCOSE 82 87 94 -- 149*  BUN 21 29* 31* -- 29*  CREATININE 0.82 0.89 0.99 1.19 1.15  CALCIUM 8.7 8.6 8.7 -- 10.1  MG 1.7 -- -- -- --  PHOS -- -- -- -- --    Basename 10/12/11 1815  AST 18  ALT 16  ALKPHOS 83  BILITOT 0.7  PROT 6.5  ALBUMIN 3.2*    Basename 10/13/11 0515 10/13/11 0238 10/12/11 1815  WBC 4.9 5.0 --  NEUTROABS -- -- 4.7  HGB 11.9* 12.3* --  HCT 37.2* 38.2* --  MCV 87.3 88.0 --  PLT 160 175 --    Basename 10/13/11 2347 10/13/11 1537 10/13/11 0810  CKTOTAL 53 60 58  CKMB 4.0 3.5 3.1  CKMBINDEX -- -- --  TROPONINI <0.30 <0.30 <0.30     Basename 10/13/11 0238  TSH 1.033  T4TOTAL --  T3FREE --  THYROIDAB --     Micro Results: Recent Results (from the past 240 hour(s))  CLOSTRIDIUM DIFFICILE BY PCR     Status: Normal   Collection Time   10/14/11  6:39  PM      Component Value Range Status Comment   C difficile by pcr NEGATIVE  NEGATIVE  Final     Studies/Results: No results found. Echo: Left ventricle: The cavity size was normal. Systolic function was normal. The estimated ejection fraction was in the range of 60% to 65%. Wall motion was normal; there were no regional wall motion abnormalities. There was an increased relative contribution of atrial contraction to ventricular filling, which may be due to aging or hypovolemia.   Medications: I have reviewed the patient's current medications.  Assessment and plan:  Principal Problem:  1*Pre-syncope: The most likely cause of this is probably has hypotension secondary to the use of blood pressure medications and ongoing diarrhea. C. Difficile negative. The most likely cause of his diarrhea would be viral as well as family members had flulike symptoms. If C. difficile negative could probably go home.  Active Problems: 2.PULMONARY NODULE-PET negative: To follow up with his pulmonologist.  3. Rheumatoid arthritis: Avoid NSAIDs. During  episode of low BP.  4. Hypotension: Resolved. We'll continue to hold diuretics. His blood pressures trending up. If it continues to go above 180 we'll use hydralazine when necessary.  5.Nausea & vomiting : Resolved       LOS: 3 days   Marinda Elk M.D. Pager: [phone] [facility] 10/15/2011, 7:52 AM

## 2011-10-15 NOTE — Progress Notes (Signed)
Physical Therapy Treatment Patient Details Name: [name] MRN: [id number] DOB: 1927/10/12 Today's Date: 10/15/2011  PT Assessment/Plan  PT - Assessment/Plan Comments on Treatment Session: Pt admitted for presyncope and has demonstrated balance deficits. 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  PT Plan: Discharge plan remains appropriate;Frequency remains appropriate PT Goals  Acute Rehab PT Goals PT Goal: Ambulate - Progress: Progressing toward goal Additional Goals PT Goal: Additional Goal #1 - Progress: Progressing toward goal  PT Treatment Precautions/Restrictions  Precautions Precautions: Fall Restrictions Weight Bearing Restrictions: No Mobility (including Balance) Bed Mobility Supine to Sit: 6: Modified independent (Device/Increase time);HOB flat Sitting - Scoot to Edge of Bed: 6: Modified independent (Device/Increase time) Transfers Sit to Stand: 6: Modified independent (Device/Increase time);From bed;From toilet (x 4 trials) Stand to Sit: 6: Modified independent (Device/Increase time);To toilet;To chair/3-in-1 (x 4 trials) Ambulation/Gait Ambulation/Gait Assistance: 6: Modified independent (Device/Increase time) Ambulation Distance (Feet): 375 Feet Assistive device: None Gait Pattern: Decreased stride length;Antalgic (increased lateral sway) Gait velocity: 30/21=1.43 ft/sec, increased fall risk Stairs: No  Balance Balance Assessed: Yes Dynamic Standing Balance Dynamic Standing - Balance Activities: Other (comment) (sharpened Romberg 12,42sec: Romberg eyes closed 30,60sec) Exercise  General Exercises - Lower Extremity Long Arc Quad: AROM;Both;20 reps;Seated Hip Flexion/Marching: AROM;Both;Seated;Other (comment) (20 reps) End of Session PT - End of Session Activity Tolerance: Patient  limited by fatigue Patient left: in chair;with call bell in reach Nurse Communication: Mobility status for ambulation;Mobility status for transfers General Behavior During Session: [facility] for tasks performed Cognition: [facility] for tasks performed  Delorse Lek 10/15/2011, 9:34 AM Toney Sang, PT [phone]

## 2011-10-15 NOTE — Progress Notes (Addendum)
Dr David Stall notified of pt having PVC's this am while walking in hall with therapy. Pt had few PVC here and there and a run of 3. Pt asymptomatic, denied pain or SOB. Pt going home today. MD states pt still okay to go home, no changes, okay to d/c telemetry now.

## 2011-10-15 NOTE — Discharge Summary (Signed)
Admit date: 10/12/2011 Discharge date: 10/15/2011  Primary Care Physician:  [name], MD, MD   Discharge Diagnoses:   Active Hospital Problems  Diagnoses Date Noted   . PULMONARY NODULE-PET negative 05/15/2009   . 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 Rheumatoid arthritis 05/14/2009     Resolved Hospital Problems  Diagnoses Date Noted Date Resolved  . Diarrhea 10/14/2011 10/15/2011  . Pre-syncope 10/12/2011 10/15/2011  . Hypotension 10/12/2011 10/14/2011  . Nausea & vomiting 10/12/2011 10/15/2011     DISCHARGE MEDICATION: Current Discharge Medication List    CONTINUE these medications which have NOT CHANGED   Details  albuterol (PROVENTIL) (2.5 MG/3ML) 0.083% nebulizer solution Take 2.5 mg by nebulization every 4 (four) hours as needed. For shortness of breath    HYDROcodone-acetaminophen (VICODIN) 5-500 MG per tablet Take 1 tablet by mouth 2 (two) times daily as needed. For pain    metaxalone (SKELAXIN) 800 MG tablet Take 800 mg by mouth 2 (two) times daily.    Tamsulosin HCl (FLOMAX) 0.4 MG CAPS Take 0.4 mg by mouth at bedtime.    traMADol (ULTRAM) 50 MG tablet Take 50 mg by mouth every 8 (eight) hours as needed. For pain           Consults:     SIGNIFICANT DIAGNOSTIC STUDIES:  Dg Chest 2 View  10/12/2011  *RADIOLOGY REPORT*  Clinical Data: Near-syncope.  CHEST - 2 VIEW  Comparison: Chest x-ray 07/25/2011.  Findings: The cardiac silhouette, mediastinal and hilar contours are within normal limits and stable.  Low lung volumes with vascular crowding and areas of atelectasis.  No infiltrates, edema or effusions.  The bony thorax is intact.  IMPRESSION: Low lung volumes with vascular crowding and atelectasis.  Original Report Authenticated By: P. Loralie Champagne, M.D.   Ct Angio Chest W/cm &/or Wo Cm  10/12/2011  *RADIOLOGY REPORT*  Clinical Data: Sudden onset of dizziness and diaphoresis.  Assess for pulmonary embolism.  Chest pain.  CT ANGIOGRAPHY CHEST   Technique:  Multidetector CT imaging of the chest using the standard protocol during bolus administration of intravenous contrast. Multiplanar reconstructed images including MIPs were obtained and reviewed to evaluate the vascular anatomy.  Contrast:  100 ml Omnipaque 350  Comparison: Chest radiography same day.  Chest CT 04/26/2009.  Findings: Pulmonary arterial opacification is good.  There are no pulmonary emboli.  There is atherosclerosis of the aorta without evidence of dissection.  There are now innumerable small pulmonary nodules scattered throughout the chest consistent with widespread metastatic disease. These are somewhat more numerous in the right lung than the left. The largest nodule is a 14 mm nodule the right lower lobe on image 43.  Most of the other nodules are 5 mm and smaller.  Pretracheal lymph nodes appear the same, the largest measuring 10 mm in diameter.  Aortopulmonary window lymph nodes appear the same measuring 10- 11 mm in diameter.  Peri carinal lymph nodes appear the same including a precarinal lymph node that measures 16 mm in diameter.  Right hilar lymph node appears the same measuring 19 mm in diameter.  No pleural or pericardial fluid.  In the upper abdomen, there is no adrenal mass.  The liver is not evaluated with contrast, but no definite lesions are seen.  There are wall calcifications of the gallbladder.  No evidence of spinal fracture or lytic spinal lesion.  There are degenerative changes throughout the thoracic spine.  IMPRESSION: No evidence of pulmonary emboli.  Marked increase in number of nodules throughout the  lungs consistent with progression of metastatic disease.  Fairly stable pattern of lymphadenopathy in the mediastinum and right hilum.  Original Report Authenticated By: Thomasenia Sales, M.D.     ECHO: Study Conclusions  Left ventricle: The cavity size was normal. Systolic function was normal. The estimated ejection fraction was in the range of 60% to 65%.  Wall motion was normal; there were no regional wall motion abnormalities. There was an increased relative contribution of atrial contraction to ventricular filling, which may be due to aging or hypovolemia.      Recent Results (from the past 240 hour(s))  CLOSTRIDIUM DIFFICILE BY PCR     Status: Normal   Collection Time   10/14/11  6:39 PM      Component Value Range Status Comment   C difficile by pcr NEGATIVE  NEGATIVE  Final     BRIEF ADMITTING H & P: 78 yo brought in by daughter after having a near syncopal episode at home. He was having some nausea prior to the event. Was found to have sinus tachycardia with some PVC's as well as Diarrhea in the ED. His Daughter had the Flu last week. No fever, no BRBPR, no melena. He felt weak afterwards but is now back to his baseline in the ED.   Active Hospital Problems  Diagnoses Date Noted   . PULMONARY NODULE-PET negative:' Follow up with Dr. Marchelle Gearing. 05/15/2009   . Mediastinal Histoplasmosis: Per pulmonary as outpatient. 05/15/2009   . Rheumatoid arthritis: Resume Nsaid's as outpatient. 05/14/2009     Resolved Hospital Problems  Diagnoses Date Noted Date Resolved  . Diarrhea: Resolved, c. Dif. Negative. He was manage with supportive management. This was most likely viral gastroenteritis. 10/14/2011 10/15/2011  . Pre-syncope: Most likely 2/2 to diarrhea. He was given IV fluids aggressively. Cardiac enzymes negative. 2-d echo as above. Dizzy ness upon standing resolved with IV fluids. 10/12/2011 10/15/2011  . Hypotension: Resolved with IV, treated with conservative management. 10/12/2011 10/14/2011  . Nausea & vomiting: Most likely 2/2 to viral gastroenteritis. Supportive management. 10/12/2011 10/15/2011     Disposition and Follow-up:  Discharge Orders    Future Orders Please Complete By Expires   Diet - low sodium heart healthy      Increase activity slowly      Home Health      Questions: Responses:   To provide the  following care/treatments PT   Face-to-face encounter      Comments:   I David Stall, ABRAHAM certify that this patient is under my care and that I, or a nurse practitioner or physician's assistant working with me, had a face-to-face encounter that meets the physician face-to-face encounter requirements with this patient on 10/15/2011.       Questions: Responses:   The encounter with the patient was in whole, or in part, for the following medical condition, which is the primary reason for home health care weakness   I certify that, based on my findings, the following services are medically necessary home health services Physical therapy   My clinical findings support the need for the above services High Risk for rehospitalization   Further, I certify that my clinical findings support that this patient is homebound (i.e. absences from home require considerable and taxing effort and are for medical reasons or religious services or infrequently or of short duration when for other reasons) Ambulates short distances less than 300 feet   To provide the following care/treatments PT     Follow-up Information  Follow up with [name], MD .          DISCHARGE EXAM:  General: Alert, awake, oriented x3, in no acute distress.  HEENT: No bruits, no goiter.  Heart: Regular rate and rhythm, without murmurs, rubs, gallops.  Lungs: Good air movement and clear to auscultation.  Abdomen: Soft, mildly sore on his epigastric and left lower quadrant improved from yesterday, nondistended, positive bowel sounds.  Neuro: Grossly intact, nonfocal.  Blood pressure 154/82, pulse 78, temperature 97.3 F (36.3 C), temperature source Oral, resp. rate 20, height 5\' 5"  (1.651 m), weight 74 kg (163 lb 2.3 oz), SpO2 95.00%.   Basename 10/14/11 0615 10/13/11 1537  NA 138 140  K 3.3* 3.6  CL 109 107  CO2 21 24  GLUCOSE 82 87  BUN 21 29*  CREATININE 0.82 0.89  CALCIUM 8.7 8.6  MG 1.7 --  PHOS -- --     Basename 10/12/11 1815  AST 18  ALT 16  ALKPHOS 83  BILITOT 0.7  PROT 6.5  ALBUMIN 3.2*    Basename 10/14/11 0615  LIPASE 17  AMYLASE --    Basename 10/13/11 0515 10/13/11 0238 10/12/11 1815  WBC 4.9 5.0 --  NEUTROABS -- -- 4.7  HGB 11.9* 12.3* --  HCT 37.2* 38.2* --  MCV 87.3 88.0 --  PLT 160 175 --    Signed: Marinda Elk M.D. 10/15/2011, 8:14 AM

## 2011-10-15 NOTE — Progress Notes (Signed)
   CARE MANAGEMENT NOTE 10/15/2011  Patient:  [name], [name]   Account Number:  [id number]  Date Initiated:  10/15/2011  Documentation initiated by:  Letha Cape  Subjective/Objective Assessment:   dx  pulmonary nodule-pet  admit     Action/Plan:   pt eval- rec hhpt   Anticipated DC Date:  10/15/2011   Anticipated DC Plan:  HOME W HOME HEALTH SERVICES      DC Planning Services  CM consult      [facility] Choice  HOME HEALTH   Choice offered to / List presented to:  C-1 Patient        HH arranged  HH-2 PT      [facility] agency  [facility]   Status of service:  Completed, signed off Medicare Important Message given?   (If response is "NO", the following Medicare IM given date fields will be blank) Date Medicare IM given:   Date Additional Medicare IM given:    Discharge Disposition:  HOME W HOME HEALTH SERVICES  Per UR Regulation:    Comments:  PCP [name]  10/15/11 11:38 Letha Cape RN, BSN [phone] Patient for dc today, physical therapy recs hhpt, patient chose [facility] since he has had them previously, referral sent to [facility] via [facility] and [facility] notified.  Soc will begin 24-48 hrs post dc.

## 2011-10-15 NOTE — Progress Notes (Signed)
D/C INSTRUCTIONS REVIEWED WITH PT AND DAUGHTER. BOTH VERBALIZED UNDERSTANDING OF ALL INSTRUCTIONS. COPY OF INSTRUCTIONS GIVEN TO PT. NO SCRIPTS TO GIVE PT. CASE MANAGER KRISTI CALLED AND CM DEBRA ARRANGED HHPT  FOR PT D/C INSTRUCTIONS.  PT D/C'D VIA WHEELCHAIR WITH BELONGINGS WITH FAMILY AND ESCORTED BY HOSPITAL VOLUNTEER.

## 2011-11-04 ENCOUNTER — Other Ambulatory Visit: Payer: Self-pay | Admitting: Internal Medicine

## 2011-11-04 DIAGNOSIS — R609 Edema, unspecified: Secondary | ICD-10-CM

## 2012-03-06 ENCOUNTER — Emergency Department (HOSPITAL_COMMUNITY): Payer: Medicare Other

## 2012-03-06 ENCOUNTER — Encounter (HOSPITAL_COMMUNITY): Payer: Self-pay | Admitting: *Deleted

## 2012-03-06 ENCOUNTER — Emergency Department (HOSPITAL_COMMUNITY)
Admission: EM | Admit: 2012-03-06 | Discharge: 2012-03-06 | Disposition: A | Discharge status: Home / Self Care | Payer: Medicare Other | Attending: Emergency Medicine | Admitting: Emergency Medicine

## 2012-03-06 DIAGNOSIS — M79609 Pain in unspecified limb: Secondary | ICD-10-CM

## 2012-03-06 DIAGNOSIS — Z8739 Personal history of other diseases of the musculoskeletal system and connective tissue: Secondary | ICD-10-CM | POA: Insufficient documentation

## 2012-03-06 DIAGNOSIS — R609 Edema, unspecified: Secondary | ICD-10-CM | POA: Insufficient documentation

## 2012-03-06 DIAGNOSIS — M79606 Pain in leg, unspecified: Secondary | ICD-10-CM

## 2012-03-06 DIAGNOSIS — M7989 Other specified soft tissue disorders: Secondary | ICD-10-CM

## 2012-03-06 DIAGNOSIS — Z79899 Other long term (current) drug therapy: Secondary | ICD-10-CM | POA: Insufficient documentation

## 2012-03-06 DIAGNOSIS — J984 Other disorders of lung: Secondary | ICD-10-CM | POA: Insufficient documentation

## 2012-03-06 DIAGNOSIS — I10 Essential (primary) hypertension: Secondary | ICD-10-CM | POA: Insufficient documentation

## 2012-03-06 LAB — DIFFERENTIAL
Basophils Absolute: 0 10*3/uL (ref 0.0–0.1)
Basophils Relative: 1 % (ref 0–1)
Eosinophils Absolute: 0.2 10*3/uL (ref 0.0–0.7)
Eosinophils Relative: 2 % (ref 0–5)
Lymphs Abs: 1.2 10*3/uL (ref 0.7–4.0)

## 2012-03-06 LAB — HEPATIC FUNCTION PANEL
Bilirubin, Direct: 0.1 mg/dL (ref 0.0–0.3)
Indirect Bilirubin: 0.5 mg/dL (ref 0.3–0.9)

## 2012-03-06 LAB — CBC
MCH: 29.2 pg (ref 26.0–34.0)
MCHC: 33.4 g/dL (ref 30.0–36.0)
MCV: 87.5 fL (ref 78.0–100.0)
Platelets: 205 10*3/uL (ref 150–400)
RDW: 15.3 % (ref 11.5–15.5)

## 2012-03-06 LAB — BASIC METABOLIC PANEL
Calcium: 9.9 mg/dL (ref 8.4–10.5)
GFR calc Af Amer: >90 mL/min (ref >90)
GFR calc non Af Amer: 78 mL/min — ABNORMAL LOW (ref >90)
Glucose, Bld: 147 mg/dL — ABNORMAL HIGH (ref 70–99)
Sodium: 138 mEq/L (ref 135–145)

## 2012-03-06 LAB — URINALYSIS, ROUTINE W REFLEX MICROSCOPIC
Bilirubin Urine: NEGATIVE
Glucose, UA: NEGATIVE mg/dL
Ketones, ur: NEGATIVE mg/dL
Nitrite: NEGATIVE
pH: 5 (ref 5.0–8.0)

## 2012-03-06 LAB — PRO B NATRIURETIC PEPTIDE: Pro B Natriuretic peptide (BNP): 173.7 pg/mL (ref 0–450)

## 2012-03-06 LAB — PROTIME-INR: INR: 0.95 (ref 0.00–1.49)

## 2012-03-06 MED ORDER — FUROSEMIDE 40 MG PO TABS: 40.0000 mg | ORAL_TABLET | Freq: Every day | ORAL | Status: DC

## 2012-03-06 MED ORDER — TRAMADOL HCL 50 MG PO TABS: 50.0000 mg | ORAL_TABLET | Freq: Four times a day (QID) | ORAL | Status: AC | PRN

## 2012-03-06 MED ORDER — FUROSEMIDE 10 MG/ML IJ SOLN
40.0000 mg | Freq: Once | INTRAMUSCULAR | Status: AC
  Administered 2012-03-06: 40 mg via INTRAMUSCULAR
  Filled 2012-03-06: qty 4

## 2012-03-06 NOTE — Discharge Instructions (Signed)
Stay on a low salt diet.  Edema Edema is an abnormal build-up of fluids in tissues. Because this is partly dependent on gravity (water flows to the lowest place), it is more common in the leg sand thighs (lower extremities). It is also common in the looser tissues, like around the eyes. Painless swelling of the feet and ankles is common and increases as a person ages. It may affect both legs and may include the calves or even thighs. When squeezed, the fluid may move out of the affected area and may leave a dent for a few moments. CAUSES   Prolonged standing or sitting in one place for extended periods of time. Movement helps pump tissue fluid into the veins, and absence of movement prevents this, resulting in edema.   Varicose veins. The valves in the veins do not work as well as they should. This causes fluid to leak into the tissues.   Fluid and salt overload.   Injury, burn, or surgery to the leg, ankle, or foot, may damage veins and allow fluid to leak out.   Sunburn damages vessels. Leaky vessels allow fluid to go out into the sunburned tissues.   Allergies (from insect bites or stings, medications or chemicals) cause swelling by allowing vessels to become leaky.   Protein in the blood helps keep fluid in your vessels. Low protein, as in malnutrition, allows fluid to leak out.   Hormonal changes, including pregnancy and menstruation, cause fluid retention. This fluid may leak out of vessels and cause edema.   Medications that cause fluid retention. Examples are sex hormones, blood pressure medications, steroid treatment, or anti-depressants.   Some illnesses cause edema, especially heart failure, kidney disease, or liver disease.   Surgery that cuts veins or lymph nodes, such as surgery done for the heart or for breast cancer, may result in edema.  DIAGNOSIS  Your caregiver is usually easily able to determine what is causing your swelling (edema) by simply asking what is wrong  (getting a history) and examining you (doing a physical). Sometimes x-rays, EKG (electrocardiogram or heart tracing), and blood work may be done to evaluate for underlying medical illness. TREATMENT  General treatment includes:  Leg elevation (or elevation of the affected body part).   Restriction of fluid intake.   Prevention of fluid overload.   Compression of the affected body part. Compression with elastic bandages or support stockings squeezes the tissues, preventing fluid from entering and forcing it back into the blood vessels.   Diuretics (also called water pills or fluid pills) pull fluid out of your body in the form of increased urination. These are effective in reducing the swelling, but can have side effects and must be used only under your caregiver's supervision. Diuretics are appropriate only for some types of edema.  The specific treatment can be directed at any underlying causes discovered. Heart, liver, or kidney disease should be treated appropriately. HOME CARE INSTRUCTIONS   Elevate the legs (or affected body part) above the level of the heart, while lying down.   Avoid sitting or standing still for prolonged periods of time.   Avoid putting anything directly under the knees when lying down, and do not wear constricting clothing or garters on the upper legs.   Exercising the legs causes the fluid to work back into the veins and lymphatic channels. This may help the swelling go down.   The pressure applied by elastic bandages or support stockings can help reduce ankle swelling.  A low-salt diet may help reduce fluid retention and decrease the ankle swelling.   Take any medications exactly as prescribed.  SEEK MEDICAL CARE IF:  Your edema is not responding to recommended treatments. SEEK IMMEDIATE MEDICAL CARE IF:   You develop shortness of breath or chest pain.   You cannot breathe when you lay down; or if, while lying down, you have to get up and go to the  window to get your breath.   You are having increasing swelling without relief from treatment.   You develop a fever over 102 F (38.9 C).   You develop pain or redness in the areas that are swollen.   Tell your caregiver right away if you have gained 3 lb/1.4 kg in 1 day or 5 lb/2.3 kg in a week.  MAKE SURE YOU:   Understand these instructions.   Will watch your condition.   Will get help right away if you are not doing well or get worse.  Document Released: 09/14/2005 Document Revised: 09/03/2011 Document Reviewed: 05/02/2008 [facility] Patient Information 2012 [place], [place].  Furosemide tablets What is this medicine? FUROSEMIDE (fyoor [place] se mide) is a diuretic. It helps you make more urine and to lose salt and excess water from your body. This medicine is used to treat high blood pressure, and edema or swelling from heart, kidney, or liver disease. This medicine may be used for other purposes; ask your health care provider or pharmacist if you have questions. What should I tell my health care provider before I take this medicine? They need to know if you have any of these conditions: -abnormal blood electrolytes -diarrhea or vomiting -gout -heart disease -kidney disease, small amounts of urine, or difficulty passing urine -liver disease -an unusual or allergic reaction to furosemide, sulfa drugs, other medicines, foods, dyes, or preservatives -pregnant or trying to get pregnant -breast-feeding How should I use this medicine? Take this medicine by mouth with a glass of water. Follow the directions on the prescription label. You may take this medicine with or without food. If it upsets your stomach, take it with food or milk. Do not take your medicine more often than directed. Remember that you will need to pass more urine after taking this medicine. Do not take your medicine at a time of day that will cause you problems. Do not take at bedtime. Talk to your pediatrician  regarding the use of this medicine in children. While this drug may be prescribed for selected conditions, precautions do apply. Overdosage: If you think you have taken too much of this medicine contact a poison control center or emergency room at once. NOTE: This medicine is only for you. Do not share this medicine with others. What if I miss a dose? If you miss a dose, take it as soon as you can. If it is almost time for your next dose, take only that dose. Do not take double or extra doses. What may interact with this medicine? -aspirin and aspirin-like medicines -certain antibiotics -chloral hydrate -cisplatin -cyclosporine -digoxin -diuretics -laxatives -lithium -medicines for blood pressure -medicines that relax muscles for surgery -methotrexate -NSAIDs, medicines for pain and inflammation like ibuprofen, naproxen, or indomethacin -phenytoin -steroid medicines like prednisone or cortisone -sucralfate This list may not describe all possible interactions. Give your health care provider a list of all the medicines, herbs, non-prescription drugs, or dietary supplements you use. Also tell them if you smoke, drink alcohol, or use illegal drugs. Some items may interact with your  medicine. What should I watch for while using this medicine? Visit your doctor or health care professional for regular checks on your progress. Check your blood pressure regularly. Ask your doctor or health care professional what your blood pressure should be, and when you should contact him or her. If you are a diabetic, check your blood sugar as directed. You may need to be on a special diet while taking this medicine. Check with your doctor. Also, ask how many glasses of fluid you need to drink a day. You must not get dehydrated. You may get drowsy or dizzy. Do not drive, use machinery, or do anything that needs mental alertness until you know how this drug affects you. Do not stand or sit up quickly, especially  if you are an older patient. This reduces the risk of dizzy or fainting spells. Alcohol can make you more drowsy and dizzy. Avoid alcoholic drinks. This medicine can make you more sensitive to the sun. Keep out of the sun. If you cannot avoid being in the sun, wear protective clothing and use sunscreen. Do not use sun lamps or tanning beds/booths. What side effects may I notice from receiving this medicine? Side effects that you should report to your doctor or health care professional as soon as possible: -blood in urine or stools -dry mouth -fever or chills -hearing loss or ringing in the ears -irregular heartbeat -muscle pain or weakness, cramps -skin rash -stomach upset, pain, or nausea -tingling or numbness in the hands or feet -unusually weak or tired -vomiting or diarrhea -yellowing of the eyes or skin Side effects that usually do not require medical attention (report to your doctor or health care professional if they continue or are bothersome): -headache -loss of appetite -unusual bleeding or bruising This list may not describe all possible side effects. Call your doctor for medical advice about side effects. You may report side effects to FDA at 1-800-FDA-1088. Where should I keep my medicine? Keep out of the reach of children. Store at room temperature between 15 and 30 degrees C (59 and 86 degrees F). Protect from light. Throw away any unused medicine after the expiration date. NOTE: This sheet is a summary. It may not cover all possible information. If you have questions about this medicine, talk to your doctor, pharmacist, or health care provider.  2012, [employer] Standard. (09/02/2009 4:24:50 PM)  Tramadol tablets What is this medicine? TRAMADOL (TRA ma dole) is a pain reliever. It is used to treat moderate to severe pain in adults. This medicine may be used for other purposes; ask your health care provider or pharmacist if you have questions. What should I tell my  health care provider before I take this medicine? They need to know if you have any of these conditions: -brain tumor -depression -drug abuse or addiction -head injury -if you frequently drink alcohol containing drinks -kidney disease or trouble passing urine -liver disease -lung disease, asthma, or breathing problems -seizures or epilepsy -suicidal thoughts, plans, or attempt; a previous suicide attempt by you or a family member -an unusual or allergic reaction to tramadol, codeine, other medicines, foods, dyes, or preservatives -pregnant or trying to get pregnant -breast-feeding How should I use this medicine? Take this medicine by mouth with a full glass of water. Follow the directions on the prescription label. If the medicine upsets your stomach, take it with food or milk. Do not take more medicine than you are told to take. Talk to your pediatrician regarding the use of  this medicine in children. Special care may be needed. Overdosage: If you think you have taken too much of this medicine contact a poison control center or emergency room at once. NOTE: This medicine is only for you. Do not share this medicine with others. What if I miss a dose? If you miss a dose, take it as soon as you can. If it is almost time for your next dose, take only that dose. Do not take double or extra doses. What may interact with this medicine? Do not take this medicine with any of the following medications: -MAOIs like Carbex, Eldepryl, Marplan, Nardil, and Parnate This medicine may also interact with the following medications: -alcohol or medicines that contain alcohol -antihistamines -benzodiazepines -bupropion -carbamazepine or oxcarbazepine -clozapine -cyclobenzaprine -digoxin -furazolidone -linezolid -medicines for depression, anxiety, or psychotic disturbances -medicines for migraine headache like almotriptan, eletriptan, frovatriptan, naratriptan, rizatriptan, sumatriptan,  zolmitriptan -medicines for pain like pentazocine, buprenorphine, butorphanol, meperidine, nalbuphine, and propoxyphene -medicines for sleep -muscle relaxants -naltrexone -phenobarbital -phenothiazines like perphenazine, thioridazine, chlorpromazine, mesoridazine, fluphenazine, prochlorperazine, promazine, and trifluoperazine -procarbazine -warfarin This list may not describe all possible interactions. Give your health care provider a list of all the medicines, herbs, non-prescription drugs, or dietary supplements you use. Also tell them if you smoke, drink alcohol, or use illegal drugs. Some items may interact with your medicine. What should I watch for while using this medicine? Tell your doctor or health care professional if your pain does not go away, if it gets worse, or if you have new or a different type of pain. You may develop tolerance to the medicine. Tolerance means that you will need a higher dose of the medicine for pain relief. Tolerance is normal and is expected if you take this medicine for a long time. Do not suddenly stop taking your medicine because you may develop a severe reaction. Your body becomes used to the medicine. This does NOT mean you are addicted. Addiction is a behavior related to getting and using a drug for a non-medical reason. If you have pain, you have a medical reason to take pain medicine. Your doctor will tell you how much medicine to take. If your doctor wants you to stop the medicine, the dose will be slowly lowered over time to avoid any side effects. You may get drowsy or dizzy. Do not drive, use machinery, or do anything that needs mental alertness until you know how this medicine affects you. Do not stand or sit up quickly, especially if you are an older patient. This reduces the risk of dizzy or fainting spells. Alcohol can increase or decrease the effects of this medicine. Avoid alcoholic drinks. You may have constipation. Try to have a bowel movement at  least every 2 to 3 days. If you do not have a bowel movement for 3 days, call your doctor or health care professional. Your mouth may get dry. Chewing sugarless gum or sucking hard candy, and drinking plenty of water may help. Contact your doctor if the problem does not go away or is severe. What side effects may I notice from receiving this medicine? Side effects that you should report to your doctor or health care professional as soon as possible: -allergic reactions like skin rash, itching or hives, swelling of the face, lips, or tongue -breathing difficulties, wheezing -confusion -itching -light headedness or fainting spells -redness, blistering, peeling or loosening of the skin, including inside the mouth -seizures Side effects that usually do not require medical attention (report to  your doctor or health care professional if they continue or are bothersome): -constipation -dizziness -drowsiness -headache -nausea, vomiting This list may not describe all possible side effects. Call your doctor for medical advice about side effects. You may report side effects to FDA at 1-800-FDA-1088. Where should I keep my medicine? Keep out of the reach of children. Store at room temperature between 15 and 30 degrees C (59 and 86 degrees F). Keep container tightly closed. Throw away any unused medicine after the expiration date. NOTE: This sheet is a summary. It may not cover all possible information. If you have questions about this medicine, talk to your doctor, pharmacist, or health care provider.  2012, [employer]vier/Gold Standard. (05/28/2010 11:55:44 AM)

## 2012-03-06 NOTE — ED Notes (Signed)
Pt discharged home. Had no further questions. 

## 2012-03-06 NOTE — ED Notes (Signed)
Pt reports pain and swelling to bilateral feet for over one week, has been ongoing and has seen pcp for it. Now having increase in pain and unable to sleep.

## 2012-03-06 NOTE — ED Notes (Signed)
Pt resting quietly at the time. Updated on plan of care. Family at bedside. No signs of distress. Pt sitting up in recliner chair at present, he is comfortable.

## 2012-03-06 NOTE — Progress Notes (Signed)
VASCULAR LAB PRELIMINARY  PRELIMINARY  PRELIMINARY  PRELIMINARY  Left lower extremity venous Doppler completed.    Preliminary report:  There is no DVT or SVT noted in the left lower extremity.  Incidentally, arterial flow is adequate.  Sherren Kerns [place], 03/06/2012, 2:45 PM

## 2012-03-06 NOTE — ED Provider Notes (Signed)
History   This chart was scribed for Jon Melter, MD scribed by Magnus Sinning. The patient was seen in room STRE2/STRE2 seen at 13:30.    CSN: [id number]  Arrival date & time 03/06/12  1206   First MD Initiated Contact with Patient 03/06/12 1322      Chief Complaint  Patient presents with  . Foot Swelling    (Consider location/radiation/quality/duration/timing/severity/associated sxs/prior treatment) HPI [name] is a 78 y.o. male who presents to the Emergency Department complaining of constant moderate left foot pain with associated bilateral foot swelling that extends up to mid calf. Patient says pain is modified with walking and aggravated when extended straight out. Denies CP,dysnpea,  or back pain. Has an ulcer on his toe, which his podiatrist has been treating with an ointment. Dr. Romona Curls at [facility] Medical PCP: Dr. Ricki Miller  Past Medical History  Diagnosis Date  . Hypertension   . Arthritis   . Ex-smoker     History reviewed. No pertinent past surgical history.  History reviewed. No pertinent family history.  History  Substance Use Topics  . Smoking status: Former Smoker    Types: Cigarettes    Quit date: 10/12/1973  . Smokeless tobacco: Not on file  . Alcohol Use: No      Review of Systems 10 Systems reviewed and are negative for acute change except as noted in the HPI. Allergies  Codeine and Penicillins  Home Medications   Current Outpatient Rx  Name Route Sig Dispense Refill  . ALBUTEROL SULFATE (2.5 MG/3ML) 0.083% IN NEBU Nebulization Take 2.5 mg by nebulization every 4 (four) hours as needed. For shortness of breath    . ASPIRIN EC 81 MG PO TBEC Oral Take 81 mg by mouth daily.    Marland Kitchen VITAMIN D 2000 UNITS PO TABS Oral Take 2,000 Units by mouth daily.    Marland Kitchen DIPHENHYDRAMINE HCL 25 MG PO TABS Oral Take 25 mg by mouth every 6 (six) hours as needed. For itching    . GABAPENTIN 100 MG PO CAPS Oral Take 100 mg by mouth 2 (two) times daily. For leg pain     . LEFLUNOMIDE 20 MG PO TABS Oral Take 20 mg by mouth daily.    Marland Kitchen OVER THE COUNTER MEDICATION Both Eyes Place 1 drop into both eyes daily. Eye drops for lubrication (stylane?)    . PREDNISONE 5 MG PO TABS Oral Take 5 mg by mouth every morning. Take with food    . SILVER SULFADIAZINE 1 % EX CREA Topical Apply 1 application topically daily. For the toe    . TRAMADOL HCL 50 MG PO TABS Oral Take 50 mg by mouth every 8 (eight) hours as needed. For pain    . VALSARTAN 160 MG PO TABS Oral Take 160 mg by mouth daily. For blood pressure      BP 135/69  Pulse 100  Temp(Src) 98.5 F (36.9 C) (Oral)  Resp 18  SpO2 98%  Physical Exam  Constitutional:       Cooperative.    Musculoskeletal:       Left leg is tender and swollen from the knee to the toes. Bandage to the left great toe.    ED Course  Procedures (including critical care time) DIAGNOSTIC STUDIES: Oxygen Saturation is 98%% on room air, normal by my interpretation.    COORDINATION OF CARE:  Screening exam done. Screening evaluation in ordered. Transfer patient to the back for further evaluation.    Labs Reviewed -  No data to display No results found.   No diagnosis found.    MDM        I personally performed the services described in this documentation, which was scribed in my presence. The recorded information has been reviewed and considered.   Jon Melter, MD 03/06/12 [phone]

## 2012-03-06 NOTE — ED Provider Notes (Signed)
History     CSN: [id number]  Arrival date & time 03/06/12  1206   First MD Initiated Contact with Patient 03/06/12 1322      Chief Complaint  Patient presents with  . Foot Swelling    (Consider location/radiation/quality/duration/timing/severity/associated sxs/prior treatment) The history is provided by the patient.   78 year old male has been having swelling in his legs for months if not years. It is getting worse. Over the last several weeks, he has noted he is having some pain in his legs and feet related to this. He was referred to a podiatrist who noted an ulcer on his left first toe. This is been treated with topical measures. He is complaining that he has pain in his legs when he tries to laydown to go to sleep. He denies any chest pain, heaviness, tightness, pressure. Denies any dyspnea. He has not had any fever, chills, sweats.  Past Medical History  Diagnosis Date  . Hypertension   . Arthritis   . Ex-smoker     History reviewed. No pertinent past surgical history.  History reviewed. No pertinent family history.  History  Substance Use Topics  . Smoking status: Former Smoker    Types: Cigarettes    Quit date: 10/12/1973  . Smokeless tobacco: Not on file  . Alcohol Use: No      Review of Systems  All other systems reviewed and are negative.    Allergies  Codeine and Penicillins  Home Medications   Current Outpatient Rx  Name Route Sig Dispense Refill  . ALBUTEROL SULFATE (2.5 MG/3ML) 0.083% IN NEBU Nebulization Take 2.5 mg by nebulization every 4 (four) hours as needed. For shortness of breath    . ASPIRIN EC 81 MG PO TBEC Oral Take 81 mg by mouth daily.    Marland Kitchen VITAMIN D 2000 UNITS PO TABS Oral Take 2,000 Units by mouth daily.    Marland Kitchen DIPHENHYDRAMINE HCL 25 MG PO TABS Oral Take 25 mg by mouth every 6 (six) hours as needed. For itching    . GABAPENTIN 100 MG PO CAPS Oral Take 100 mg by mouth 2 (two) times daily. For leg pain    . LEFLUNOMIDE 20 MG PO TABS  Oral Take 20 mg by mouth daily.    Marland Kitchen OVER THE COUNTER MEDICATION Both Eyes Place 1 drop into both eyes daily. Eye drops for lubrication (stylane?)    . PREDNISONE 5 MG PO TABS Oral Take 5 mg by mouth every morning. Take with food    . SILVER SULFADIAZINE 1 % EX CREA Topical Apply 1 application topically daily. For the toe    . TRAMADOL HCL 50 MG PO TABS Oral Take 50 mg by mouth every 8 (eight) hours as needed. For pain    . VALSARTAN 160 MG PO TABS Oral Take 160 mg by mouth daily. For blood pressure      BP 135/69  Pulse 100  Temp(Src) 98.5 F (36.9 C) (Oral)  Resp 18  SpO2 98%  Physical Exam  Nursing note and vitals reviewed.  78 year old male is resting comfortably and in no acute distress. Vital signs are normal. Oxygen saturation is 98% which is normal. Head is normocephalic and atraumatic. PERRLA, EOMI. Neck is nontender and supple without adenopathy or JVD. Lungs are clear without rales, wheezes, rhonchi. Back has 2+ presacral edema. Heart has regular rate rhythm without murmur. Abdomen is soft, flat, nontender without masses or hepatosplenomegaly. Extremities: There is a 3+ pitting edema. This is present on the  left first toe which is not removed. Feet appear dusky and capillary refill is 3-4 seconds. Pulses are palpable. Skin is warm and dry without other rash. Neurologic: Mental status is normal, cranial nerves are intact, there are no motor or sensory deficits.  ED Course  Procedures (including critical care time)  Results for orders placed during the hospital encounter of 03/06/12  CBC      Component Value Range   WBC 8.4  4.0 - 10.5 (K/uL)   RBC 4.65  4.22 - 5.81 (MIL/uL)   Hemoglobin 13.6  13.0 - 17.0 (g/dL)   HCT 16.1  09.6 - 04.5 (%)   MCV 87.5  78.0 - 100.0 (fL)   MCH 29.2  26.0 - 34.0 (pg)   MCHC 33.4  30.0 - 36.0 (g/dL)   RDW 40.9  81.1 - 91.4 (%)   Platelets 205  150 - 400 (K/uL)  DIFFERENTIAL      Component Value Range   Neutrophils Relative 77  43 - 77 (%)    Neutro Abs 6.4  1.7 - 7.7 (K/uL)   Lymphocytes Relative 15  12 - 46 (%)   Lymphs Abs 1.2  0.7 - 4.0 (K/uL)   Monocytes Relative 6  3 - 12 (%)   Monocytes Absolute 0.5  0.1 - 1.0 (K/uL)   Eosinophils Relative 2  0 - 5 (%)   Eosinophils Absolute 0.2  0.0 - 0.7 (K/uL)   Basophils Relative 1  0 - 1 (%)   Basophils Absolute 0.0  0.0 - 0.1 (K/uL)  BASIC METABOLIC PANEL      Component Value Range   Sodium 138  135 - 145 (mEq/L)   Potassium 4.3  3.5 - 5.1 (mEq/L)   Chloride 102  96 - 112 (mEq/L)   CO2 26  19 - 32 (mEq/L)   Glucose, Bld 147 (*) 70 - 99 (mg/dL)   BUN 24 (*) 6 - 23 (mg/dL)   Creatinine, Ser 7.82  0.50 - 1.35 (mg/dL)   Calcium 9.9  8.4 - 95.6 (mg/dL)   GFR calc non Af Amer 78 (*) >90 (mL/min)   GFR calc Af Amer >90  >90 (mL/min)  PROTIME-INR      Component Value Range   Prothrombin Time 12.9  11.6 - 15.2 (seconds)   INR 0.95  0.00 - 1.49   URINALYSIS, ROUTINE W REFLEX MICROSCOPIC      Component Value Range   Color, Urine YELLOW  YELLOW    APPearance CLEAR  CLEAR    Specific Gravity, Urine 1.022  1.005 - 1.030    pH 5.0  5.0 - 8.0    Glucose, UA NEGATIVE  NEGATIVE (mg/dL)   Hgb urine dipstick NEGATIVE  NEGATIVE    Bilirubin Urine NEGATIVE  NEGATIVE    Ketones, ur NEGATIVE  NEGATIVE (mg/dL)   Protein, ur NEGATIVE  NEGATIVE (mg/dL)   Urobilinogen, UA 0.2  0.0 - 1.0 (mg/dL)   Nitrite NEGATIVE  NEGATIVE    Leukocytes, UA NEGATIVE  NEGATIVE   PRO B NATRIURETIC PEPTIDE      Component Value Range   Pro B Natriuretic peptide (BNP) 173.7  0 - 450 (pg/mL)  HEPATIC FUNCTION PANEL      Component Value Range   Total Protein 6.1  6.0 - 8.3 (g/dL)   Albumin 3.0 (*) 3.5 - 5.2 (g/dL)   AST 21  0 - 37 (U/L)   ALT 21  0 - 53 (U/L)   Alkaline Phosphatase 91  39 - 117 (  U/L)   Total Bilirubin 0.6  0.3 - 1.2 (mg/dL)   Bilirubin, Direct 0.1  0.0 - 0.3 (mg/dL)   Indirect Bilirubin 0.5  0.3 - 0.9 (mg/dL)   Dg Chest 2 View  10/03/1094  *RADIOLOGY REPORT*  Clinical Data: Foot swelling  for 3 days.  History of hypertension and pulmonary nodules.  CHEST - 2 VIEW  Comparison: Radiographs and CT 10/12/2011.  Findings: The heart size and mediastinal contours are stable status post median sternotomy.  The overall pulmonary aeration has improved.  There is chronic blunting of the left costophrenic angle.  Pulmonary nodularity has not substantially changed, greater on the right.  No airspace disease is identified.  Osseous structures are stable.  IMPRESSION:  1.  Overall improved pulmonary aeration. 2.  Grossly unchanged pulmonary nodularity.  These nodules appeared to be slowly growing based on most recent CT. 3.  No acute process identified.  Original Report Authenticated By: Gerrianne Scale, M.D.      1. Peripheral edema   2. Leg pain       MDM  Peripheral edema which is probably multifactorial. Chest x-ray, renal function, hepatic function will be checked. [place] and vascular Doppler was obtained in both arterial and venous Dopplers were reported to be unremarkable.  Workup shows hypoalbuminemia which may be a major part of his reason he is having peripheral edema. He does not have evidence of significant congestive heart failure. He is given a dose of furosemide and senna with a prescription for furosemide to take once a day and is to followup with his PCP to evaluate response to treatment. He is advised to weigh himself daily to monitor his progress.     Dione Booze, MD 03/06/12 [phone]

## 2012-03-07 LAB — URINE CULTURE
Colony Count: NO GROWTH
Culture  Setup Time: 201306091823

## 2012-04-30 ENCOUNTER — Encounter (HOSPITAL_COMMUNITY): Payer: Self-pay | Admitting: *Deleted

## 2012-04-30 ENCOUNTER — Emergency Department (HOSPITAL_COMMUNITY): Payer: Medicare Other

## 2012-04-30 ENCOUNTER — Emergency Department (HOSPITAL_COMMUNITY)
Admission: EM | Admit: 2012-04-30 | Discharge: 2012-04-30 | Disposition: A | Discharge status: Home / Self Care | Payer: Medicare Other | Attending: Emergency Medicine | Admitting: Emergency Medicine

## 2012-04-30 DIAGNOSIS — I10 Essential (primary) hypertension: Secondary | ICD-10-CM | POA: Insufficient documentation

## 2012-04-30 DIAGNOSIS — W1789XA Other fall from one level to another, initial encounter: Secondary | ICD-10-CM | POA: Insufficient documentation

## 2012-04-30 DIAGNOSIS — S2239XA Fracture of one rib, unspecified side, initial encounter for closed fracture: Secondary | ICD-10-CM | POA: Insufficient documentation

## 2012-04-30 DIAGNOSIS — M129 Arthropathy, unspecified: Secondary | ICD-10-CM | POA: Insufficient documentation

## 2012-04-30 DIAGNOSIS — Z87891 Personal history of nicotine dependence: Secondary | ICD-10-CM | POA: Insufficient documentation

## 2012-04-30 DIAGNOSIS — Z7982 Long term (current) use of aspirin: Secondary | ICD-10-CM | POA: Insufficient documentation

## 2012-04-30 NOTE — ED Provider Notes (Signed)
History     CSN: [id number]  Arrival date & time 04/30/12  7829   First MD Initiated Contact with Patient 04/30/12 [phone]      Chief Complaint  Patient presents with  . Fall    (Consider location/radiation/quality/duration/timing/severity/associated sxs/prior treatment) HPI Comments: Was walking with walker, fell, and struck ribs on walker.  Patient is a 78 y.o. male presenting with fall. The history is provided by the patient.  Fall The accident occurred 2 days ago. The fall occurred while walking. He fell from a height of 1 to 2 ft. Impact surface: landed on the arm of his walker. There was no blood loss. Point of impact: left chest. Pain location: left chest. The pain is moderate. He was ambulatory at the scene. The symptoms are aggravated by activity (deep breath, palpation).    Past Medical History  Diagnosis Date  . Hypertension   . Arthritis   . Ex-smoker     History reviewed. No pertinent past surgical history.  History reviewed. No pertinent family history.  History  Substance Use Topics  . Smoking status: Former Smoker    Types: Cigarettes    Quit date: 10/12/1973  . Smokeless tobacco: Not on file  . Alcohol Use: No      Review of Systems  All other systems reviewed and are negative.    Allergies  Codeine and Penicillins  Home Medications   Current Outpatient Rx  Name Route Sig Dispense Refill  . ALBUTEROL SULFATE (2.5 MG/3ML) 0.083% IN NEBU Nebulization Take 2.5 mg by nebulization every 4 (four) hours as needed. For shortness of breath    . ASPIRIN EC 81 MG PO TBEC Oral Take 81 mg by mouth daily.    Marland Kitchen VITAMIN D 2000 UNITS PO TABS Oral Take 2,000 Units by mouth daily.    Marland Kitchen DIPHENHYDRAMINE HCL 25 MG PO TABS Oral Take 25 mg by mouth every 6 (six) hours as needed. For itching    . FUROSEMIDE 40 MG PO TABS Oral Take 1 tablet (40 mg total) by mouth daily. 15 tablet 0  . GABAPENTIN 100 MG PO CAPS Oral Take 100 mg by mouth 2 (two) times daily. For leg pain     . LEFLUNOMIDE 20 MG PO TABS Oral Take 20 mg by mouth daily.    Marland Kitchen OVER THE COUNTER MEDICATION Both Eyes Place 1 drop into both eyes daily. Eye drops for lubrication (stylane?)    . PREDNISONE 5 MG PO TABS Oral Take 5 mg by mouth every morning. Take with food    . SILVER SULFADIAZINE 1 % EX CREA Topical Apply 1 application topically daily. For the toe    . TRAMADOL HCL 50 MG PO TABS Oral Take 50 mg by mouth every 8 (eight) hours as needed. For pain    . VALSARTAN 160 MG PO TABS Oral Take 160 mg by mouth daily. For blood pressure      BP 129/69  Pulse 103  Temp 97.7 F (36.5 C) (Oral)  Resp 16  SpO2 95%  Physical Exam  Nursing note and vitals reviewed. Constitutional: He is oriented to person, place, and time. He appears well-developed and well-nourished. No distress.  HENT:  Head: Normocephalic and atraumatic.  Neck: Normal range of motion. Neck supple.  Cardiovascular: Normal rate and regular rhythm.   No murmur heard. Pulmonary/Chest: Effort normal.       He is ttp over the left lateral ribs.  Breath sounds are present and equal bilaterally.  Abdominal: Soft.  Bowel sounds are normal. He exhibits no distension. There is no tenderness.  Musculoskeletal: Normal range of motion.       No pain in the left shoulder with palpation or range of motion.  Neurological: He is alert and oriented to person, place, and time.  Skin: Skin is warm and dry. He is not diaphoretic.    ED Course  Procedures (including critical care time)  Labs Reviewed - No data to display No results found.   No diagnosis found.    MDM  The xrays reveal a ninth rib fracture but no evidence for ptx or pulmonary contusion.  He already has a bottle of percocet and I have advised he continue this.  To return for increasing pain, fever, or productive cough.        Geoffery Lyons, MD 04/30/12 1057

## 2012-04-30 NOTE — ED Notes (Signed)
Reports falling onto his walker on wed, having pain under left arm since.

## 2012-05-08 ENCOUNTER — Emergency Department (HOSPITAL_COMMUNITY)
Admission: EM | Admit: 2012-05-08 | Discharge: 2012-05-08 | Disposition: A | Discharge status: Home / Self Care | Payer: Medicare Other | Attending: Emergency Medicine | Admitting: Emergency Medicine

## 2012-05-08 ENCOUNTER — Encounter (HOSPITAL_COMMUNITY): Payer: Self-pay | Admitting: *Deleted

## 2012-05-08 DIAGNOSIS — Z96659 Presence of unspecified artificial knee joint: Secondary | ICD-10-CM | POA: Insufficient documentation

## 2012-05-08 DIAGNOSIS — M129 Arthropathy, unspecified: Secondary | ICD-10-CM | POA: Insufficient documentation

## 2012-05-08 DIAGNOSIS — R7309 Other abnormal glucose: Secondary | ICD-10-CM | POA: Insufficient documentation

## 2012-05-08 DIAGNOSIS — R739 Hyperglycemia, unspecified: Secondary | ICD-10-CM

## 2012-05-08 DIAGNOSIS — Z87891 Personal history of nicotine dependence: Secondary | ICD-10-CM | POA: Insufficient documentation

## 2012-05-08 DIAGNOSIS — I10 Essential (primary) hypertension: Secondary | ICD-10-CM | POA: Insufficient documentation

## 2012-05-08 DIAGNOSIS — I872 Venous insufficiency (chronic) (peripheral): Secondary | ICD-10-CM | POA: Insufficient documentation

## 2012-05-08 DIAGNOSIS — Z880 Allergy status to penicillin: Secondary | ICD-10-CM | POA: Insufficient documentation

## 2012-05-08 LAB — CBC WITH DIFFERENTIAL/PLATELET
Basophils Absolute: 0 10*3/uL (ref 0.0–0.1)
HCT: 37.2 % — ABNORMAL LOW (ref 39.0–52.0)
Hemoglobin: 11.8 g/dL — ABNORMAL LOW (ref 13.0–17.0)
Lymphocytes Relative: 16 % (ref 12–46)
Monocytes Absolute: 0.8 10*3/uL (ref 0.1–1.0)
Monocytes Relative: 9 % (ref 3–12)
Neutro Abs: 6.8 10*3/uL (ref 1.7–7.7)
WBC: 9.1 10*3/uL (ref 4.0–10.5)

## 2012-05-08 LAB — BASIC METABOLIC PANEL
Chloride: 102 mEq/L (ref 96–112)
GFR calc Af Amer: >90 mL/min (ref >90)
Potassium: 5 mEq/L (ref 3.5–5.1)

## 2012-05-08 MED ORDER — FUROSEMIDE 40 MG PO TABS: 40.0000 mg | ORAL_TABLET | Freq: Every day | ORAL | Status: DC

## 2012-05-08 NOTE — ED Notes (Signed)
The patient is AOx4 and comfortable with his discharge instructions. 

## 2012-05-08 NOTE — ED Notes (Signed)
Pt presents to department for evaluation of bilateral lower leg swelling. Ongoing x7 weeks. States pain, swelling and redness to lower legs. Pedal pulses present bilaterally. Feet and legs painful to touch. Pitting edema noted. 8/10 pain that becomes worse with movement. He is alert and oriented x4. Denies chest pain. Denies shortness of breathe. No signs of distress noted at the time.

## 2012-05-08 NOTE — ED Notes (Signed)
Pt is here with bilateral leg swelling and redness for 7 weeks.  Swelling is tight up to knees.  No shortness of breath

## 2012-05-08 NOTE — ED Provider Notes (Addendum)
History     CSN: [id number]  Arrival date & time 05/08/12  1504   First MD Initiated Contact with Patient 05/08/12 1831      Chief Complaint  Patient presents with  . Leg Swelling    red and oozing    (Consider location/radiation/quality/duration/timing/severity/associated sxs/prior treatment) The history is provided by the patient, a relative and the spouse.   78 year old, male, who is confined to a chair because he cannot lie down due to spinal stenosis, presents to the emergency department complaining of bilateral lower extremity, swelling, and pain.  He denies chest pain, cough, or shortness of breath.  He has not had congestive heart failure.  He denies renal or liver disease.  He has not had fevers, or chills.  He states if you were to lie down.  He gets pain.  That shoots down.  His leg.  The swelling in his legs have been present for several months.  He had been on Lasix.  However, he has stopped it because he, says it makes him feel weak.  Past Medical History  Diagnosis Date  . Hypertension   . Arthritis   . Ex-smoker     Past Surgical History  Procedure Date  . Joint replacement     bilateral knee replacement  . Hernia repair   . Back surgery     No family history on file.  History  Substance Use Topics  . Smoking status: Former Smoker    Types: Cigarettes    Quit date: 10/12/1973  . Smokeless tobacco: Not on file  . Alcohol Use: No      Review of Systems  Constitutional: Negative for fever and chills.  Respiratory: Negative for cough, chest tightness and shortness of breath.   Cardiovascular: Positive for leg swelling. Negative for chest pain.  Gastrointestinal: Negative for nausea, vomiting and abdominal pain.  Musculoskeletal: Positive for back pain.  Skin: Positive for color change.       Lower extremity weeping, with erythema bilaterally  Neurological: Positive for weakness. Negative for headaches.  Hematological: Does not bruise/bleed easily.    Psychiatric/Behavioral: Negative for confusion.  All other systems reviewed and are negative.    Allergies  Codeine; Hydrocodone; Oxycodone; and Penicillins  Home Medications   Current Outpatient Rx  Name Route Sig Dispense Refill  . ALBUTEROL SULFATE (2.5 MG/3ML) 0.083% IN NEBU Nebulization Take 2.5 mg by nebulization every 4 (four) hours as needed. For shortness of breath    . ALENDRONATE SODIUM 70 MG PO TABS Oral Take 70 mg by mouth every 7 (seven) days. Take with a full glass of water on an empty stomach.  On Friday    . ASPIRIN EC 81 MG PO TBEC Oral Take 81 mg by mouth daily.    . B COMPLEX-C PO TABS Oral Take 1 tablet by mouth daily.    Marland Kitchen VITAMIN D 2000 UNITS PO TABS Oral Take 2,000 Units by mouth daily.    . FUROSEMIDE 40 MG PO TABS Oral Take 40 mg by mouth daily.    Marland Kitchen GABAPENTIN 100 MG PO CAPS Oral Take 200 mg by mouth 2 (two) times daily.    Marland Kitchen LEFLUNOMIDE 20 MG PO TABS Oral Take 20 mg by mouth daily.    Marland Kitchen OMEPRAZOLE 20 MG PO CPDR Oral Take 20 mg by mouth 2 (two) times daily.    Marland Kitchen PREDNISONE 5 MG PO TABS Oral Take 7.5 mg by mouth daily. Take with food    . SILVER SULFADIAZINE  1 % EX CREA Topical Apply 1 application topically 2 (two) times daily. To feet    . TRAMADOL HCL 50 MG PO TABS Oral Take 50 mg by mouth 2 (two) times daily as needed. For pain    . VALSARTAN 160 MG PO TABS Oral Take 160 mg by mouth 2 (two) times daily. For blood pressure    . FUROSEMIDE 40 MG PO TABS Oral Take 1 tablet (40 mg total) by mouth daily. 30 tablet 0    BP 126/70  Pulse 64  Temp 98.2 F (36.8 C) (Oral)  Resp 20  SpO2 94%  Physical Exam  Nursing note and vitals reviewed. Constitutional: He is oriented to person, place, and time. No distress.       Obese male, sitting upright in no distress  HENT:  Head: Normocephalic and atraumatic.  Eyes: Conjunctivae are normal.  Neck: Normal range of motion. Neck supple.  Cardiovascular: Normal rate.   No murmur heard. Pulmonary/Chest: Effort  normal and breath sounds normal. No respiratory distress. He has no wheezes. He has no rales.  Abdominal: Soft. He exhibits no distension. There is no tenderness.  Musculoskeletal: He exhibits edema and tenderness.       4+ bilateral pitting edema, with diffuse circumferential erythema, and weeping in the lower extremities, bilaterally  Neurological: He is alert and oriented to person, place, and time.  Skin: Skin is warm and dry. There is erythema.  Psychiatric: He has a normal mood and affect. Thought content normal.    ED Course  Procedures (including critical care time) peripheral venous insufficiency, with pitting edema weeping, and venous stasis changes.  No evidence of congestive heart failure.  No renal failure, and no risk factors for liver disease  Labs Reviewed  BASIC METABOLIC PANEL - Abnormal; Notable for the following:    CO2 33 (*)     Glucose, Bld 116 (*)     BUN 30 (*)     GFR calc non Af Amer 78 (*)     All other components within normal limits  CBC WITH DIFFERENTIAL - Abnormal; Notable for the following:    RBC 4.12 (*)     Hemoglobin 11.8 (*)     HCT 37.2 (*)     RDW 15.9 (*)     All other components within normal limits   No results found.   1. Venous insufficiency (chronic) (peripheral)   2. Hyperglycemia       MDM  Venous insufficiency No signs of CHF, renal disease, or liver disease No evidence of cellulitis, and bilateral DVT.  Of course, is very unlikely        Cheri Guppy, MD 05/08/12 [facility]  Cheri Guppy, MD 05/08/12 [facility]

## 2012-05-10 ENCOUNTER — Other Ambulatory Visit: Payer: Self-pay | Admitting: Internal Medicine

## 2012-05-10 DIAGNOSIS — R748 Abnormal levels of other serum enzymes: Secondary | ICD-10-CM

## 2012-05-13 ENCOUNTER — Ambulatory Visit
Admission: RE | Admit: 2012-05-13 | Discharge: 2012-05-13 | Disposition: A | Discharge status: Home / Self Care | Payer: Medicare Other | Source: Ambulatory Visit | Attending: Internal Medicine | Admitting: Internal Medicine

## 2012-05-13 DIAGNOSIS — R748 Abnormal levels of other serum enzymes: Secondary | ICD-10-CM

## 2012-06-04 ENCOUNTER — Encounter (HOSPITAL_COMMUNITY): Payer: Self-pay | Admitting: Emergency Medicine

## 2012-06-04 ENCOUNTER — Emergency Department (HOSPITAL_COMMUNITY): Payer: Medicare Other

## 2012-06-04 ENCOUNTER — Inpatient Hospital Stay (HOSPITAL_COMMUNITY)
Admission: EM | Admit: 2012-06-04 | Discharge: 2012-06-13 | DRG: 603 | Disposition: A | Discharge status: Skilled Nursing Facility | Payer: Medicare Other | Attending: Internal Medicine | Admitting: Internal Medicine

## 2012-06-04 DIAGNOSIS — R339 Retention of urine, unspecified: Secondary | ICD-10-CM | POA: Diagnosis present

## 2012-06-04 DIAGNOSIS — N179 Acute kidney failure, unspecified: Secondary | ICD-10-CM | POA: Diagnosis present

## 2012-06-04 DIAGNOSIS — L0390 Cellulitis, unspecified: Secondary | ICD-10-CM

## 2012-06-04 DIAGNOSIS — E860 Dehydration: Secondary | ICD-10-CM

## 2012-06-04 DIAGNOSIS — I10 Essential (primary) hypertension: Secondary | ICD-10-CM

## 2012-06-04 DIAGNOSIS — M069 Rheumatoid arthritis, unspecified: Secondary | ICD-10-CM

## 2012-06-04 DIAGNOSIS — L03119 Cellulitis of unspecified part of limb: Principal | ICD-10-CM | POA: Diagnosis present

## 2012-06-04 DIAGNOSIS — J984 Other disorders of lung: Secondary | ICD-10-CM

## 2012-06-04 DIAGNOSIS — D638 Anemia in other chronic diseases classified elsewhere: Secondary | ICD-10-CM | POA: Diagnosis present

## 2012-06-04 DIAGNOSIS — L02419 Cutaneous abscess of limb, unspecified: Principal | ICD-10-CM | POA: Diagnosis present

## 2012-06-04 DIAGNOSIS — R509 Fever, unspecified: Secondary | ICD-10-CM | POA: Diagnosis present

## 2012-06-04 DIAGNOSIS — F05 Delirium due to known physiological condition: Secondary | ICD-10-CM | POA: Diagnosis present

## 2012-06-04 DIAGNOSIS — L0291 Cutaneous abscess, unspecified: Secondary | ICD-10-CM

## 2012-06-04 DIAGNOSIS — R000 Tachycardia, unspecified: Secondary | ICD-10-CM | POA: Diagnosis present

## 2012-06-04 DIAGNOSIS — D72829 Elevated white blood cell count, unspecified: Secondary | ICD-10-CM | POA: Diagnosis present

## 2012-06-04 DIAGNOSIS — L03116 Cellulitis of left lower limb: Secondary | ICD-10-CM

## 2012-06-04 DIAGNOSIS — L03115 Cellulitis of right lower limb: Secondary | ICD-10-CM | POA: Diagnosis present

## 2012-06-04 DIAGNOSIS — R5381 Other malaise: Secondary | ICD-10-CM | POA: Diagnosis present

## 2012-06-04 DIAGNOSIS — R310 Gross hematuria: Secondary | ICD-10-CM | POA: Diagnosis present

## 2012-06-04 DIAGNOSIS — D649 Anemia, unspecified: Secondary | ICD-10-CM | POA: Diagnosis present

## 2012-06-04 HISTORY — DX: Rheumatoid arthritis, unspecified: M06.9

## 2012-06-04 HISTORY — DX: Solitary pulmonary nodule: R91.1

## 2012-06-04 HISTORY — DX: Reserved for concepts with insufficient information to code with codable children: IMO0002

## 2012-06-04 LAB — URINALYSIS, ROUTINE W REFLEX MICROSCOPIC
Leukocytes, UA: NEGATIVE
Nitrite: NEGATIVE
Protein, ur: 30 mg/dL — AB

## 2012-06-04 LAB — LACTIC ACID, PLASMA: Lactic Acid, Venous: 1 mmol/L (ref 0.5–2.2)

## 2012-06-04 LAB — CBC WITH DIFFERENTIAL/PLATELET
Basophils Absolute: 0 10*3/uL (ref 0.0–0.1)
Eosinophils Relative: 1 % (ref 0–5)
HCT: 29.6 % — ABNORMAL LOW (ref 39.0–52.0)
Hemoglobin: 9.7 g/dL — ABNORMAL LOW (ref 13.0–17.0)
Lymphocytes Relative: 9 % — ABNORMAL LOW (ref 12–46)
Lymphs Abs: 1.2 10*3/uL (ref 0.7–4.0)
MCV: 87.1 fL (ref 78.0–100.0)
Monocytes Absolute: 1 10*3/uL (ref 0.1–1.0)
Monocytes Relative: 8 % (ref 3–12)
RDW: 15.3 % (ref 11.5–15.5)
WBC: 12.5 10*3/uL — ABNORMAL HIGH (ref 4.0–10.5)

## 2012-06-04 LAB — URINE MICROSCOPIC-ADD ON

## 2012-06-04 LAB — COMPREHENSIVE METABOLIC PANEL
BUN: 79 mg/dL — ABNORMAL HIGH (ref 6–23)
CO2: 20 mEq/L (ref 19–32)
Calcium: 8.8 mg/dL (ref 8.4–10.5)
Chloride: 102 mEq/L (ref 96–112)
Creatinine, Ser: 3.3 mg/dL — ABNORMAL HIGH (ref 0.50–1.35)
GFR calc Af Amer: 19 mL/min — ABNORMAL LOW (ref >90)
GFR calc non Af Amer: 16 mL/min — ABNORMAL LOW (ref >90)
Glucose, Bld: 101 mg/dL — ABNORMAL HIGH (ref 70–99)
Total Bilirubin: 0.4 mg/dL (ref 0.3–1.2)

## 2012-06-04 LAB — PROCALCITONIN: Procalcitonin: 0.37 ng/mL

## 2012-06-04 MED ORDER — ACETAMINOPHEN 325 MG PO TABS
650.0000 mg | ORAL_TABLET | Freq: Four times a day (QID) | ORAL | Status: DC | PRN
  Administered 2012-06-04 – 2012-06-12 (×4): 650 mg via ORAL
  Filled 2012-06-04 (×3): qty 2

## 2012-06-04 MED ORDER — ACETAMINOPHEN 650 MG RE SUPP: 650.0000 mg | Freq: Four times a day (QID) | RECTAL | Status: DC | PRN

## 2012-06-04 MED ORDER — SODIUM CHLORIDE 0.9 % IV SOLN
INTRAVENOUS | Status: DC
  Administered 2012-06-06: 12:00:00 via INTRAVENOUS

## 2012-06-04 MED ORDER — ACETAMINOPHEN 325 MG PO TABS
650.0000 mg | ORAL_TABLET | Freq: Four times a day (QID) | ORAL | Status: DC | PRN
  Filled 2012-06-04: qty 2

## 2012-06-04 MED ORDER — VANCOMYCIN HCL IN DEXTROSE 1-5 GM/200ML-% IV SOLN
1000.0000 mg | INTRAVENOUS | Status: DC
  Administered 2012-06-06: 1000 mg via INTRAVENOUS
  Filled 2012-06-04: qty 200

## 2012-06-04 MED ORDER — DEXTROSE 5 % IV SOLN
2.0000 g | Freq: Once | INTRAVENOUS | Status: AC
  Administered 2012-06-04: 2 g via INTRAVENOUS
  Filled 2012-06-04: qty 2

## 2012-06-04 MED ORDER — SODIUM CHLORIDE 0.9 % IJ SOLN
3.0000 mL | Freq: Two times a day (BID) | INTRAMUSCULAR | Status: DC
  Administered 2012-06-07 – 2012-06-12 (×5): 3 mL via INTRAVENOUS

## 2012-06-04 MED ORDER — VANCOMYCIN HCL IN DEXTROSE 1-5 GM/200ML-% IV SOLN
1000.0000 mg | Freq: Once | INTRAVENOUS | Status: AC
  Administered 2012-06-04: 1000 mg via INTRAVENOUS
  Filled 2012-06-04: qty 200

## 2012-06-04 MED ORDER — SODIUM CHLORIDE 0.9 % IV SOLN
1000.0000 mL | Freq: Once | INTRAVENOUS | Status: AC
  Administered 2012-06-04: 1000 mL via INTRAVENOUS

## 2012-06-04 MED ORDER — ALUM & MAG HYDROXIDE-SIMETH 200-200-20 MG/5ML PO SUSP: 30.0000 mL | Freq: Four times a day (QID) | ORAL | Status: DC | PRN

## 2012-06-04 MED ORDER — DEXTROSE 5 % IV SOLN
500.0000 mg | Freq: Three times a day (TID) | INTRAVENOUS | Status: DC
  Administered 2012-06-05 – 2012-06-07 (×7): 500 mg via INTRAVENOUS
  Filled 2012-06-04 (×11): qty 0.5

## 2012-06-04 MED ORDER — ZOLPIDEM TARTRATE 5 MG PO TABS
5.0000 mg | ORAL_TABLET | Freq: Every evening | ORAL | Status: DC | PRN
  Administered 2012-06-07 – 2012-06-11 (×4): 5 mg via ORAL
  Filled 2012-06-04 (×4): qty 1

## 2012-06-04 MED ORDER — HYDROMORPHONE HCL PF 1 MG/ML IJ SOLN
0.5000 mg | INTRAMUSCULAR | Status: DC | PRN
  Administered 2012-06-05: 1 mg via INTRAVENOUS
  Administered 2012-06-05: 0.5 mg via INTRAVENOUS
  Administered 2012-06-05 (×3): 1 mg via INTRAVENOUS
  Administered 2012-06-06 – 2012-06-07 (×2): 0.5 mg via INTRAVENOUS
  Administered 2012-06-07 – 2012-06-13 (×18): 1 mg via INTRAVENOUS
  Filled 2012-06-04 (×26): qty 1

## 2012-06-04 MED ORDER — ENOXAPARIN SODIUM 40 MG/0.4ML ~~LOC~~ SOLN
40.0000 mg | SUBCUTANEOUS | Status: DC
  Administered 2012-06-05 – 2012-06-08 (×4): 40 mg via SUBCUTANEOUS
  Filled 2012-06-04 (×5): qty 0.4

## 2012-06-04 MED ORDER — ONDANSETRON HCL 4 MG PO TABS: 4.0000 mg | ORAL_TABLET | Freq: Four times a day (QID) | ORAL | Status: DC | PRN

## 2012-06-04 MED ORDER — ONDANSETRON HCL 4 MG/2ML IJ SOLN: 4.0000 mg | Freq: Four times a day (QID) | INTRAMUSCULAR | Status: DC | PRN

## 2012-06-04 MED ORDER — SODIUM CHLORIDE 0.9 % IV SOLN
1000.0000 mL | INTRAVENOUS | Status: DC
  Administered 2012-06-04 – 2012-06-05 (×2): 1000 mL via INTRAVENOUS

## 2012-06-04 MED ORDER — FENTANYL CITRATE 0.05 MG/ML IJ SOLN
50.0000 ug | Freq: Once | INTRAMUSCULAR | Status: AC
  Administered 2012-06-04: 50 ug via INTRAVENOUS
  Filled 2012-06-04: qty 2

## 2012-06-04 MED ORDER — SODIUM CHLORIDE 0.9 % IV BOLUS (SEPSIS)
1000.0000 mL | Freq: Once | INTRAVENOUS | Status: AC
  Administered 2012-06-04: 1000 mL via INTRAVENOUS

## 2012-06-04 NOTE — ED Notes (Signed)
To ED via [facility] from home, with c/o increased confusion at night, left lower leg red and hot, right lower leg red, not as warm as left.  weakness-- inability to get out of chair, increased urinary incontinence. Lives at home with family, patient hygiene is poor, clothing urine soaked, slippers urine soaked.

## 2012-06-04 NOTE — ED Notes (Signed)
Pt daughter took belonging home Randolm Idol [phone] or [phone]

## 2012-06-04 NOTE — Progress Notes (Signed)
ANTIBIOTIC CONSULT NOTE - INITIAL  Pharmacy Consult for vancomycin Indication: cellulitis  Allergies  Allergen Reactions  . Codeine Other (See Comments)     drives me crazy  . Hydrocodone Other (See Comments)    Makes patient hallucinate, cannot sleep  . Oxycodone Other (See Comments)    Makes patient hallucinate, cannot sleep  . Penicillins Hives    Patient Measurements: Height: 5\' 9"  (175.3 cm) Weight: 176 lb (79.833 kg) IBW/kg (Calculated) : 70.7   Vital Signs: Temp: 100 F (37.8 C) (09/07 1926) Temp src: Rectal (09/07 1926) BP: 210/185 mmHg (09/07 2130) Pulse Rate: 116  (09/07 2130) Intake/Output from previous day:   Intake/Output from this shift:    Labs:  Basename 06/04/12 2040  WBC 12.5*  HGB 9.7*  PLT 328  LABCREA --  CREATININE 3.30*   Estimated Creatinine Clearance: 17.6 ml/min (by C-G formula based on Cr of 3.3). No results found for this basename: VANCOTROUGH:2,VANCOPEAK:2,VANCORANDOM:2,GENTTROUGH:2,GENTPEAK:2,GENTRANDOM:2,TOBRATROUGH:2,TOBRAPEAK:2,TOBRARND:2,AMIKACINPEAK:2,AMIKACINTROU:2,AMIKACIN:2, in the last 72 hours   Microbiology: No results found for this or any previous visit (from the past 720 hour(s)).  Medical History: Past Medical History  Diagnosis Date  . Hypertension   . Arthritis   . Ex-smoker     Medications:  Scheduled:    . sodium chloride  1,000 mL Intravenous Once  . aztreonam  2 g Intravenous Once  . fentaNYL  50 mcg Intravenous Once  . sodium chloride  1,000 mL Intravenous Once  . vancomycin  1,000 mg Intravenous Once   Infusions:    . sodium chloride 1,000 mL (06/04/12 2108)   Assessment: 78 yo male admitted with cellulitis in a patient with also with acute renal failure to start vancomycin and aztreonam per pharmacy  Goal of Therapy:  Vancomycin trough level 10-15 mcg/ml  Plan:  1) First dose of vancomycin 1g IV sent to ER around 10pm. Per current renal function with estimated CrCl of 18 ml/min, will start  1g IV q48h thereafter 2) Aztreonam 2g IV sent to ER. Will start 500mg  IV q8 thereafter for CrCl 10-30 ml/min   Hessie Knows, PharmD, BCPS Pager [phone] 06/04/2012 10:10 PM

## 2012-06-04 NOTE — ED Provider Notes (Signed)
History     CSN: [id number]  Arrival date & time 06/04/12  Mikle Bosworth   First MD Initiated Contact with Patient 06/04/12 1946      Chief Complaint  Patient presents with  . Weakness  . Cellulitis  . Urinary Incontinence    HPI Patient presents to the emergency room with difficulty with fever leg swelling and confusion. Patient lives at home with family. He has chronic trouble with leg swelling and edema. Family states that his legs appear more red than usual today. He was found with his bed clothes soaked in urine. The patient states he has been having trouble controlling his urine. He did have some diarrhea yesterday but has not had any vomiting. He denies abdominal pain or chest pain but does state that he aches all over. He called his doctor was instructed to come to the emergency department. Past Medical History  Diagnosis Date  . Hypertension   . Arthritis   . Ex-smoker     Past Surgical History  Procedure Date  . Joint replacement     bilateral knee replacement  . Hernia repair   . Back surgery     History reviewed. No pertinent family history.  History  Substance Use Topics  . Smoking status: Former Smoker    Types: Cigarettes    Quit date: 10/12/1973  . Smokeless tobacco: Not on file  . Alcohol Use: No      Review of Systems  All other systems reviewed and are negative.    Allergies  Codeine; Hydrocodone; Oxycodone; and Penicillins  Home Medications   Current Outpatient Rx  Name Route Sig Dispense Refill  . ALBUTEROL SULFATE (2.5 MG/3ML) 0.083% IN NEBU Nebulization Take 2.5 mg by nebulization every 4 (four) hours as needed. For shortness of breath    . ALENDRONATE SODIUM 70 MG PO TABS Oral Take 70 mg by mouth every 7 (seven) days. Take with a full glass of water on an empty stomach.  On Friday    . ASPIRIN EC 81 MG PO TBEC Oral Take 81 mg by mouth daily.    . SUPER B COMPLEX PO Oral Take 1 tablet by mouth daily.    Marland Kitchen VITAMIN D 2000 UNITS PO TABS Oral Take  2,000 Units by mouth daily.    Marland Kitchen FLUNISOLIDE 29 MCG/ACT (0.025%) NA SOLN Nasal Place 2 sprays into the nose 2 (two) times daily. Dose is for each nostril.    . FUROSEMIDE 40 MG PO TABS Oral Take 40 mg by mouth daily.    Marland Kitchen GABAPENTIN 100 MG PO CAPS Oral Take 200 mg by mouth 2 (two) times daily.    Marland Kitchen LEFLUNOMIDE 20 MG PO TABS Oral Take 20 mg by mouth daily.    Marland Kitchen LOPERAMIDE HCL 2 MG PO CAPS Oral Take 2 mg by mouth 4 (four) times daily as needed. For diarrhea    . MENTHOL 9.1 MG MT LOZG Mouth/Throat Use as directed 1 lozenge in the mouth or throat as needed. For cough or sore throught    . OMEPRAZOLE 20 MG PO CPDR Oral Take 20 mg by mouth 2 (two) times daily.    Marland Kitchen PREDNISONE 5 MG PO TABS Oral Take 7.5 mg by mouth daily. Take with food    . SILVER SULFADIAZINE 1 % EX CREA Topical Apply 1 application topically daily. To legs and feet    . TRAMADOL HCL 50 MG PO TABS Oral Take 50 mg by mouth every 6 (six) hours as needed. For  pain    . VALSARTAN 160 MG PO TABS Oral Take 160 mg by mouth 2 (two) times daily. For blood pressure      BP 156/128  Pulse 96  Temp 100 F (37.8 C) (Rectal)  Resp 29  SpO2 99%  Physical Exam  Nursing note and vitals reviewed. Constitutional: No distress.       Disheveled  HENT:  Head: Normocephalic and atraumatic.  Right Ear: External ear normal.  Left Ear: External ear normal.  Eyes: Conjunctivae are normal. Right eye exhibits no discharge. Left eye exhibits no discharge. No scleral icterus.  Neck: Neck supple. No tracheal deviation present.  Cardiovascular: Intact distal pulses.  An irregular rhythm present. Tachycardia present.   Pulmonary/Chest: Effort normal and breath sounds normal. No stridor. No respiratory distress. He has no wheezes. He has no rales.  Abdominal: Soft. Bowel sounds are normal. He exhibits no distension. There is no tenderness. There is no rebound and no guarding.  Musculoskeletal: He exhibits edema and tenderness.       Erythema bilateral  lower extremities, tenderness to palpation left greater than right, no fluctuance or abscess, no crepitus  Neurological: He is alert. He has normal strength. No sensory deficit. Cranial nerve deficit:  no gross defecits noted. He exhibits normal muscle tone. He displays no seizure activity. Coordination normal.  Skin: Skin is warm and dry. No rash noted.  Psychiatric: He has a normal mood and affect.    ED Course  Procedures (including critical care time)  Rate: 141  Rhythm: Supraventricular bigeminy  QRS Axis: Left  Intervals: normal  ST/T Wave abnormalities: normal  Conduction Disutrbances: Left anterior fascicular block  Narrative Interpretation: Borderline prolonged QT  Old EKG Reviewed: none available CRITICAL CARE Performed by: Linwood Dibbles R Total critical care time: 35 Critical care time was exclusive of separately billable procedures and treating other patients. Critical care was necessary to treat or prevent imminent or life-threatening deterioration. Critical care was time spent personally by me on the following activities: development of treatment plan with patient and/or surrogate as well as nursing, discussions with consultants, evaluation of patient's response to treatment, examination of patient, obtaining history from patient or surrogate, ordering and performing treatments and interventions, ordering and review of laboratory studies, ordering and review of radiographic studies, pulse oximetry and re-evaluation of patient's condition.  Labs Reviewed  CBC WITH DIFFERENTIAL - Abnormal; Notable for the following:    WBC 12.5 (*)     RBC 3.40 (*)     Hemoglobin 9.7 (*)     HCT 29.6 (*)     Neutrophils Relative 82 (*)     Neutro Abs 10.3 (*)     Lymphocytes Relative 9 (*)     All other components within normal limits  COMPREHENSIVE METABOLIC PANEL - Abnormal; Notable for the following:    Glucose, Bld 101 (*)     BUN 79 (*)     Creatinine, Ser 3.30 (*)     Albumin 2.3  (*)     GFR calc non Af Amer 16 (*)     GFR calc Af Amer 19 (*)     All other components within normal limits  URINALYSIS, ROUTINE W REFLEX MICROSCOPIC - Abnormal; Notable for the following:    Color, Urine AMBER (*)  BIOCHEMICALS MAY BE AFFECTED BY COLOR   APPearance CLOUDY (*)     Hgb urine dipstick TRACE (*)     Bilirubin Urine SMALL (*)     Ketones, ur TRACE (*)  Protein, ur 30 (*)     All other components within normal limits  URINE MICROSCOPIC-ADD ON - Abnormal; Notable for the following:    Casts HYALINE CASTS (*)  GRANULAR CAST   All other components within normal limits  LACTIC ACID, PLASMA  PROCALCITONIN  CULTURE, BLOOD (ROUTINE X 2)  CULTURE, BLOOD (ROUTINE X 2)  URINE CULTURE   Dg Chest Port 1 View  06/04/2012  *RADIOLOGY REPORT*  Clinical Data: 78 year old male fever bilateral lower extremity redness.  Hypertension.  PORTABLE CHEST - 1 VIEW  Comparison: 04/30/2012 and earlier.  Findings: Semi upright AP portable view at 2020 hours.  Stable lung volumes.  Stable cardiac size and mediastinal contours.  Visualized tracheal air column is within normal limits.  No pneumothorax or edema.  No pleural effusion or acute pulmonary opacity identified. Stable elevation right hemidiaphragm.  IMPRESSION: No acute cardiopulmonary abnormality.   Original Report Authenticated By: Harley Hallmark, M.D.      1. Cellulitis   2. Acute renal failure   3. Dehydration       MDM  The patient remains tachycardic but hemodynamically stable. I reviewed his old records and he does have history of recurrent tachycardia in the past. At this time this appears to be a sinus tachycardia with premature supraventricular contraction .  Patient does have evidence of cellulitis. Sepsis markers are initially negative. IV hydration has been ordered. Empiric antibiotics have been started as well. Patient will be admitted to the hospital for further treatment and evaluation      Celene Kras,  MD 06/04/12 2203

## 2012-06-04 NOTE — ED Notes (Signed)
MD at bedside. 

## 2012-06-04 NOTE — H&P (Signed)
[facility] History and Physical  [name] JWJ:[id number] DOB: 05-Jun-1928 DOA: 06/04/2012  Referring physician: EDP PCP: [name], MD   Chief Complaint: Increasing Redness and Pain in Both legs  HPI: [name] is a 78 y.o. male who presented to the ED with complaints of worsening pain redness and swelling in both legs especially the left lower leg over the past week.  He developed fevers and chills over the past 24 hours.  He denies having any chest pain of SOB.     Review of Systems: The patient denies anorexia, weight loss, vision loss, decreased hearing, hoarseness, chest pain, syncope, dyspnea on exertion, peripheral edema, balance deficits, hemoptysis, abdominal pain, melena, hematochezia, severe indigestion/heartburn, hematuria, incontinence, genital sores, suspicious skin lesions, transient blindness, depression, unusual weight change, abnormal bleeding, enlarged lymph nodes, angioedema, and breast masses.    Past Medical History  Diagnosis Date  . Hypertension   . Arthritis   . Ex-smoker   . Rheumatoid arthritis   . Pulmonary nodule   . DDD (degenerative disc disease)    Past Surgical History  Procedure Date  . Joint replacement     bilateral knee replacement  . Hernia repair   . Back surgery     X 2    Prior to Admission medications   Medication Sig Start Date End Date Taking? Authorizing Provider  albuterol (PROVENTIL) (2.5 MG/3ML) 0.083% nebulizer solution Take 2.5 mg by nebulization every 4 (four) hours as needed. For shortness of breath   Yes Historical Provider, MD  alendronate (FOSAMAX) 70 MG tablet Take 70 mg by mouth every 7 (seven) days. Take with a full glass of water on an empty stomach.  On Friday   Yes Historical Provider, MD  aspirin EC 81 MG tablet Take 81 mg by mouth daily.   Yes Historical Provider, MD  B Complex-C (SUPER B COMPLEX PO) Take 1 tablet by mouth daily.   Yes Historical Provider, MD  Cholecalciferol (VITAMIN D) 2000 UNITS  tablet Take 2,000 Units by mouth daily.   Yes Historical Provider, MD  flunisolide (NASAREL) 29 MCG/ACT (0.025%) nasal spray Place 2 sprays into the nose 2 (two) times daily. Dose is for each nostril.   Yes Historical Provider, MD  furosemide (LASIX) 40 MG tablet Take 40 mg by mouth daily. 03/06/12 03/06/13 Yes Dione Booze, MD  gabapentin (NEURONTIN) 100 MG capsule Take 200 mg by mouth 2 (two) times daily.   Yes Historical Provider, MD  leflunomide (ARAVA) 20 MG tablet Take 20 mg by mouth daily.   Yes Historical Provider, MD  loperamide (IMODIUM) 2 MG capsule Take 2 mg by mouth 4 (four) times daily as needed. For diarrhea   Yes Historical Provider, MD  Menthol (HALLS COUGH DROPS) 9.1 MG LOZG Use as directed 1 lozenge in the mouth or throat as needed. For cough or sore throught   Yes Historical Provider, MD  omeprazole (PRILOSEC) 20 MG capsule Take 20 mg by mouth 2 (two) times daily.   Yes Historical Provider, MD  predniSONE (DELTASONE) 5 MG tablet Take 7.5 mg by mouth daily. Take with food   Yes Historical Provider, MD  silver sulfADIAZINE (SILVADENE) 1 % cream Apply 1 application topically daily. To legs and feet   Yes Historical Provider, MD  traMADol (ULTRAM) 50 MG tablet Take 50 mg by mouth every 6 (six) hours as needed. For pain   Yes Historical Provider, MD  valsartan (DIOVAN) 160 MG tablet Take 160 mg by mouth 2 (two) times  daily. For blood pressure   Yes Historical Provider, MD    Allergies  Allergen Reactions  . Codeine Other (See Comments)     drives me crazy  . Hydrocodone Other (See Comments)    Makes patient hallucinate, cannot sleep  . Oxycodone Other (See Comments)    Makes patient hallucinate, cannot sleep  . Penicillins Hives    Social History:  reports that he quit smoking about 38 years ago. His smoking use included Cigarettes. He does not have any smokeless tobacco history on file. He reports that he does not drink alcohol or use illicit drugs.    Family History  Problem  Relation Age of Onset  . Coronary artery disease Brother   . Hypertension Father   . Hypertension Mother     Physical Exam:  GEN:  Pleasant  Elderly 78 year old Obese well developed Caucasian male  examined  and in no acute distress; cooperative with exam Filed Vitals:   06/04/12 2200 06/04/12 2201 06/04/12 2230 06/04/12 2245  BP: 121/82  119/50 128/77  Pulse: 152  46 59  Temp:      TempSrc:      Resp: 21  14 23   Height:  5\' 9"  (1.753 m)    Weight:  79.833 kg (176 lb)    SpO2: 89%  92% 96%   Blood pressure 128/77, pulse 59, temperature 100 F (37.8 C), temperature source Rectal, resp. rate 23, height 5\' 9"  (1.753 m), weight 79.833 kg (176 lb), SpO2 96.00%. PSYCH: He is alert and oriented x4; does not appear anxious does not appear depressed; affect is normal HEENT: Normocephalic and Atraumatic, Mucous membranes pink; PERRLA; EOM intact; Fundi:  Benign;  No scleral icterus, Nares: Patent, Oropharynx: Clear, Poor Sparse Dentition, Neck:  FROM, no cervical lymphadenopathy nor thyromegaly or carotid bruit; no JVD; Breasts:: Not examined CHEST WALL: No tenderness CHEST: Normal respiration, clear to auscultation bilaterally HEART: Regular rate and rhythm; no murmurs rubs or gallops BACK: No kyphosis or scoliosis; no CVA tenderness ABDOMEN: Positive Bowel Sounds, Obese, soft non-tender; no masses, no organomegaly.   Rectal Exam: Not done EXTREMITIES: No bone or joint deformity; age-appropriate arthropathy of the hands and knees; no cyanosis, clubbing or2+EDEMA BILATERAL LOWER EXTREMITIES. Genitalia: not examined PULSES: 2+ and symmetric SKIN: Normal hydration no rash or ulceration CNS: Cranial nerves 2-12 grossly intact no focal neurologic deficit    Labs on Admission:  Basic Metabolic Panel:  Lab 06/04/12 1610  NA 137  K 5.1  CL 102  CO2 20  GLUCOSE 101*  BUN 79*  CREATININE 3.30*  CALCIUM 8.8  MG --  PHOS --   Liver Function Tests:  Lab 06/04/12 2040  AST 30  ALT  23  ALKPHOS 114  BILITOT 0.4  PROT 6.3  ALBUMIN 2.3*   No results found for this basename: LIPASE:5,AMYLASE:5 in the last 168 hours No results found for this basename: AMMONIA:5 in the last 168 hours CBC:  Lab 06/04/12 2040  WBC 12.5*  NEUTROABS 10.3*  HGB 9.7*  HCT 29.6*  MCV 87.1  PLT 328   Cardiac Enzymes: No results found for this basename: CKTOTAL:5,CKMB:5,CKMBINDEX:5,TROPONINI:5 in the last 168 hours  BNP (last 3 results)  Basename 03/06/12 1505  PROBNP 173.7   CBG: No results found for this basename: GLUCAP:5 in the last 168 hours  Radiological Exams on Admission: Dg Chest Port 1 View  06/04/2012  *RADIOLOGY REPORT*  Clinical Data: 78 year old male fever bilateral lower extremity redness.  Hypertension.  PORTABLE  CHEST - 1 VIEW  Comparison: 04/30/2012 and earlier.  Findings: Semi upright AP portable view at 2020 hours.  Stable lung volumes.  Stable cardiac size and mediastinal contours.  Visualized tracheal air column is within normal limits.  No pneumothorax or edema.  No pleural effusion or acute pulmonary opacity identified. Stable elevation right hemidiaphragm.  IMPRESSION: No acute cardiopulmonary abnormality.   Original Report Authenticated By: Harley Hallmark, M.D.     EKG: Independently reviewed.   Assessment/Plan Principal Problem:  *Bilateral cellulitis of lower leg Active Problems:  Fever  ARF (acute renal failure)  Anemia  Tachycardia  Leukocytosis Hypoalbuminemia   Plan:    1. Admit to Telemetry Bed due to tachycardia 2. IV Antibiotic therapy of Vancomycin and Aztreonam 3. IVFs 4. Monitor BUN/Cr and Electrolytes 5. Reconcile Home Medications 6. DVT prophylaxis 7.  Check Pre Albumin levels, and BNP.     Code Status: FULL CODE Family Communication:  Disposition Plan: Return to Home  Time spent: 60 minutes  Ron Parker [facility] Pager [phone]  If 7PM-7AM, please contact night-coverage [email or web address] Password  [facility] 06/04/2012, 11:36 PM

## 2012-06-05 DIAGNOSIS — L02419 Cutaneous abscess of limb, unspecified: Principal | ICD-10-CM

## 2012-06-05 DIAGNOSIS — N179 Acute kidney failure, unspecified: Secondary | ICD-10-CM

## 2012-06-05 DIAGNOSIS — D649 Anemia, unspecified: Secondary | ICD-10-CM

## 2012-06-05 DIAGNOSIS — E860 Dehydration: Secondary | ICD-10-CM

## 2012-06-05 LAB — BASIC METABOLIC PANEL
BUN: 68 mg/dL — ABNORMAL HIGH (ref 6–23)
Calcium: 8 mg/dL — ABNORMAL LOW (ref 8.4–10.5)
GFR calc Af Amer: 26 mL/min — ABNORMAL LOW (ref >90)
GFR calc non Af Amer: 23 mL/min — ABNORMAL LOW (ref >90)
Potassium: 4.1 mEq/L (ref 3.5–5.1)
Sodium: 138 mEq/L (ref 135–145)

## 2012-06-05 LAB — PRO B NATRIURETIC PEPTIDE: Pro B Natriuretic peptide (BNP): 506.6 pg/mL — ABNORMAL HIGH (ref 0–450)

## 2012-06-05 LAB — CBC
Hemoglobin: 9.1 g/dL — ABNORMAL LOW (ref 13.0–17.0)
MCHC: 33.2 g/dL (ref 30.0–36.0)
Platelets: 296 10*3/uL (ref 150–400)
RDW: 15.2 % (ref 11.5–15.5)

## 2012-06-05 LAB — IRON AND TIBC
Saturation Ratios: 9 % — ABNORMAL LOW (ref 20–55)
UIBC: 155 ug/dL (ref 125–400)

## 2012-06-05 LAB — RETICULOCYTES
Retic Count, Absolute: 41.1 10*3/uL (ref 19.0–186.0)
Retic Ct Pct: 1.3 % (ref 0.4–3.1)

## 2012-06-05 LAB — FOLATE: Folate: >20 ng/mL

## 2012-06-05 LAB — FERRITIN: Ferritin: 785 ng/mL — ABNORMAL HIGH (ref 22–322)

## 2012-06-05 LAB — TSH: TSH: 0.882 u[IU]/mL (ref 0.350–4.500)

## 2012-06-05 MED ORDER — SODIUM CHLORIDE 0.9 % IV SOLN
INTRAVENOUS | Status: AC
  Administered 2012-06-05: 01:00:00 via INTRAVENOUS

## 2012-06-05 NOTE — Progress Notes (Signed)
[facility] PROGRESS NOTE  [name] JXB:[id number] DOB: 1928/01/31 DOA: 06/04/2012 PCP: [name], MD  Assessment/Plan: Principal Problem:  *Bilateral cellulitis of lower leg Active Problems:  Fever  ARF (acute renal failure)  Anemia  Tachycardia  Leukocytosis    Bilateral lower extremity cellulitis -Started on broad-spectrum antibiotics, vancomycin and aztreonam. -Denies any fever or chills today, feels much better.  Acute renal failure -Likely secondary to dehydration and volume depletion. -Started on IV fluids for hydration, follow BMP closely.  Leukocytosis -Secondary to his lower extremity cellulitis, this is resolved after antibiotics instituted.  Anemia -Hemoglobin trending down since June of 2013. -Hemoglobin now is 9.1, expected to decline after IV fluid hydration. -I well check an anemia panel, follow CBC.  Code Status: Full Family Communication:  Disposition Plan: PT/OT to recommend disposition   Brief narrative: 78 year old Caucasian male with past medical history of hypertension and rheumatoid arthritis came in to the hospital with bilateral lower extremity cellulitis and acute renal failure.  Consultants:  None  Procedures:  None  Antibiotics:  Vancomycin started at 06/04/2012.  Aztreonam started at 06/04/2012  HPI/Subjective: Feels much better, denies fever or chills.  Objective: Filed Vitals:   06/04/12 2230 06/04/12 2245 06/04/12 2341 06/05/12 0600  BP: 119/50 128/77 132/62 123/64  Pulse: 46 59 108 102  Temp:   97.8 F (36.6 C) 98.2 F (36.8 C)  TempSrc:    Oral  Resp: 14 23 16 16   Height:      Weight:      SpO2: 92% 96% 98% 100%    Intake/Output Summary (Last 24 hours) at 06/05/12 1104 Last data filed at 06/05/12 0957  Gross per 24 hour  Intake   2090 ml  Output    900 ml  Net   1190 ml   Filed Weights   06/04/12 2201  Weight: 79.833 kg (176 lb)    Exam: General: Alert and awake, oriented x3, not in  any acute distress. HEENT: anicteric sclera, pupils reactive to light and accommodation, EOMI CVS: S1-S2 clear, no murmur rubs or gallops Chest: clear to auscultation bilaterally, no wheezing, rales or rhonchi Abdomen: soft nontender, nondistended, normal bowel sounds, no organomegaly Extremities: There is redness, swelling and tenderness involving both lower extremities. Neuro: Cranial nerves II-XII intact, no focal neurological deficits  Data Reviewed: Basic Metabolic Panel:  Lab 06/05/12 1308 06/04/12 2040  NA 138 137  K 4.1 5.1  CL 106 102  CO2 20 20  GLUCOSE 93 101*  BUN 68* 79*  CREATININE 2.50* 3.30*  CALCIUM 8.0* 8.8  MG -- --  PHOS -- --   Liver Function Tests:  Lab 06/04/12 2040  AST 30  ALT 23  ALKPHOS 114  BILITOT 0.4  PROT 6.3  ALBUMIN 2.3*   No results found for this basename: LIPASE:5,AMYLASE:5 in the last 168 hours No results found for this basename: AMMONIA:5 in the last 168 hours CBC:  Lab 06/05/12 0523 06/04/12 2040  WBC 9.5 12.5*  NEUTROABS -- 10.3*  HGB 9.1* 9.7*  HCT 27.4* 29.6*  MCV 86.7 87.1  PLT 296 328   Cardiac Enzymes: No results found for this basename: CKTOTAL:5,CKMB:5,CKMBINDEX:5,TROPONINI:5 in the last 168 hours BNP (last 3 results)  Basename 06/05/12 0524 03/06/12 1505  PROBNP 506.6* 173.7   CBG: No results found for this basename: GLUCAP:5 in the last 168 hours  No results found for this or any previous visit (from the past 240 hour(s)).   Studies: Dg Chest [facility] [address]  06/04/2012  *RADIOLOGY REPORT*  Clinical Data: 78 year old male fever bilateral lower extremity redness.  Hypertension.  PORTABLE CHEST - 1 VIEW  Comparison: 04/30/2012 and earlier.  Findings: Semi upright AP portable view at 2020 hours.  Stable lung volumes.  Stable cardiac size and mediastinal contours.  Visualized tracheal air column is within normal limits.  No pneumothorax or edema.  No pleural effusion or acute pulmonary opacity identified. Stable  elevation right hemidiaphragm.  IMPRESSION: No acute cardiopulmonary abnormality.   Original Report Authenticated By: Harley Hallmark, M.D.     Scheduled Meds:   . sodium chloride  1,000 mL Intravenous Once  . sodium chloride   Intravenous STAT  . aztreonam  2 g Intravenous Once  . aztreonam  500 mg Intravenous Q8H  . enoxaparin (LOVENOX) injection  40 mg Subcutaneous Q24H  . fentaNYL  50 mcg Intravenous Once  . sodium chloride  1,000 mL Intravenous Once  . sodium chloride  3 mL Intravenous Q12H  . vancomycin  1,000 mg Intravenous Once  . vancomycin  1,000 mg Intravenous Q48H   Continuous Infusions:   . sodium chloride 1,000 mL (06/04/12 2108)  . sodium chloride      Principal Problem:  *Bilateral cellulitis of lower leg Active Problems:  Fever  ARF (acute renal failure)  Anemia  Tachycardia  Leukocytosis    Time spent: 35 minutes    [facility]  [facility] Pager [phone]. If 8PM-8AM, please contact night-coverage at [email or web address], password [facility] 06/05/2012, 11:04 AM  LOS: 1 day

## 2012-06-05 NOTE — Progress Notes (Signed)
Pt has arrived on unit and is resting.  I have completed the admission history on this patient however I am unsure of the reliability of the patients responses, no family present at bedside.

## 2012-06-06 ENCOUNTER — Inpatient Hospital Stay (HOSPITAL_COMMUNITY): Payer: Medicare Other

## 2012-06-06 LAB — BASIC METABOLIC PANEL
CO2: 21 mEq/L (ref 19–32)
Calcium: 8.1 mg/dL — ABNORMAL LOW (ref 8.4–10.5)
Creatinine, Ser: 1.15 mg/dL (ref 0.50–1.35)
GFR calc non Af Amer: 58 mL/min — ABNORMAL LOW (ref >90)

## 2012-06-06 LAB — URINE CULTURE

## 2012-06-06 MED ORDER — ALBUTEROL SULFATE (5 MG/ML) 0.5% IN NEBU
2.5000 mg | INHALATION_SOLUTION | RESPIRATORY_TRACT | Status: DC | PRN
  Administered 2012-06-06 – 2012-06-10 (×13): 2.5 mg via RESPIRATORY_TRACT
  Filled 2012-06-06 (×13): qty 0.5

## 2012-06-06 NOTE — Progress Notes (Signed)
Patient has been getting more and more confused as night has gone on. Patient is now beligerant and yells for all staff to "get the hell out of my room". Patient refuses to allow staff to put his telemetry monitor back on or clean him of incontinent urine. Somewhat calmer after 2 security guards enter room but still angry. Will continue to monitor. Ginny Forth

## 2012-06-06 NOTE — Clinical Social Work Placement (Unsigned)
     Clinical Social Work Department CLINICAL SOCIAL WORK PLACEMENT NOTE 06/06/2012  Patient:  [name], [name]  Account Number:  [id number] Admit date:  06/04/2012  Clinical Social Worker:  Becky Sax, LCSW  Date/time:  06/06/2012 12:00 M  Clinical Social Work is seeking post-discharge placement for this patient at the following level of care:   SKILLED NURSING   (*CSW will update this form in Epic as items are completed)   06/06/2012  Patient/family provided with [facility] Department of Clinical Social Works list of facilities offering this level of care within the geographic area requested by the patient (or if unable, by the patients family).  06/06/2012  Patient/family informed of their freedom to choose among providers that offer the needed level of care, that participate in Medicare, Medicaid or managed care program needed by the patient, have an available bed and are willing to accept the patient.  06/06/2012  Patient/family informed of [facility] ownership interest in [facility], as well as of the fact that they are under no obligation to receive care at this facility.  PASARR submitted to EDS on 06/06/2012 PASARR number received from EDS on 06/06/2012  FL2 transmitted to all facilities in geographic area requested by pt/family on  06/06/2012 FL2 transmitted to all facilities within larger geographic area on 06/06/2012  Patient informed that his/her managed care company has contracts with or will negotiate with  certain facilities, including the following:     Patient/family informed of bed offers received:   Patient chooses bed at  Physician recommends and patient chooses bed at    Patient to be transferred to  on   Patient to be transferred to facility by   The following physician request were entered in Epic:   Additional Comments:

## 2012-06-06 NOTE — Evaluation (Signed)
Occupational Therapy Evaluation Patient Details Name: [name] MRN: [id number] DOB: May 07, 1928 Today's Date: 06/06/2012 Time: 4098-1191 OT Time Calculation (min): 31 min  OT Assessment / Plan / Recommendation Clinical Impression  This 78 year old man was admitted for Bil LE cellulitis.  He was agitated last night per nursing notes.  At baseline, pt is mod I for adls/mobility but has had some falls.  His daughter assists with iadls.  Pt is appropriate for skilled OT to increase independence with adls with overall min guard goals in acute.      OT Assessment  Patient needs continued OT Services    Follow Up Recommendations  Skilled nursing facility (would need 24/7 for home)    Barriers to Discharge      Equipment Recommendations  None recommended by PT;Defer to next venue    Recommendations for Other Services    Frequency  Min 2X/week    Precautions / Restrictions Precautions Precautions: Fall Restrictions Weight Bearing Restrictions: No   Pertinent Vitals/Pain C/o bil LE pain--not rated.  Sats 94 - 96% on 02.  HR up to 135 with a few steps    ADL  Eating/Feeding: Simulated;Independent Where Assessed - Eating/Feeding: Bed level Grooming: Simulated;Min guard (pt blew nose in standing) Where Assessed - Grooming: Supported standing Upper Body Bathing: Simulated;Set up Where Assessed - Upper Body Bathing: Unsupported sitting Lower Body Bathing: Simulated;Moderate assistance Where Assessed - Lower Body Bathing: Supported sit to stand Upper Body Dressing: Simulated;Minimal assistance (lines) Where Assessed - Upper Body Dressing: Unsupported sitting Lower Body Dressing: Simulated;Maximal assistance Where Assessed - Lower Body Dressing: Supported sit to stand Toilet Transfer: Simulated;+2 Total assistance Toilet Transfer: Patient Percentage: 70% (ambulate a few steps) Toilet Transfer Method: Sit to Barista:  (bed to recliner, few steps) Toileting -  Clothing Manipulation and Hygiene: Simulated;Minimal assistance Where Assessed - Engineer, mining and Hygiene: Sit to stand from 3-in-1 or toilet Transfers/Ambulation Related to ADLs: took a few steps in room.  Pt shaky/tremors--reports this is new.   ADL Comments: ADLs limited by endurance, painful LEs, and shakiness    OT Diagnosis: Generalized weakness  OT Problem List: Decreased strength;Decreased activity tolerance;Impaired balance (sitting and/or standing);Decreased knowledge of use of DME or AE;Cardiopulmonary status limiting activity;Pain OT Treatment Interventions: Self-care/ADL training;DME and/or AE instruction;Therapeutic activities;Patient/family education;Balance training   OT Goals Acute Rehab OT Goals OT Goal Formulation: With patient Time For Goal Achievement: 06/20/12 Potential to Achieve Goals: Good ADL Goals Pt Will Perform Grooming: with supervision;Standing at sink ADL Goal: Grooming - Progress: Goal set today Pt Will Perform Lower Body Bathing: with min assist;Sit to stand from chair;with adaptive equipment (min guard) ADL Goal: Lower Body Bathing - Progress: Goal set today Pt Will Transfer to Toilet: with min assist;Ambulation;3-in-1 (min guard) ADL Goal: Toilet Transfer - Progress: Goal set today Pt Will Perform Toileting - Hygiene: with supervision;Sit to stand from 3-in-1/toilet ADL Goal: Toileting - Hygiene - Progress: Goal set today  Visit Information  Last OT Received On: 06/06/12 Assistance Needed: +2 (+2 for chair follow w/ amb) PT/OT Co-Evaluation/Treatment: Yes    Subjective Data  Subjective: I need to call my daughter. You need to turn this bed alarm off first.   Patient Stated Goal: home   Prior Functioning  Vision/Perception  Home Living Lives With: Alone Available Help at Discharge:  (daughter cooks meals) Type of Home: House Home Access: Stairs to enter [employer] of Steps: 3 Entrance Stairs-Rails: Can reach  both Home Layout:  One level Bathroom Shower/Tub: Walk-in shower (grab bars) Bathroom Toilet: Handicapped height Home Adaptive Equipment: Walker - rolling;Shower chair without back;Hospital bed (recently back in hospital bed; had used recliner) Prior Function Level of Independence: Independent with assistive device(s) Comments: help for iadls Communication Communication: No difficulties Dominant Hand: Right      Cognition  Overall Cognitive Status: Appears within functional limits for tasks assessed/performed Arousal/Alertness: Awake/alert Orientation Level: Appears intact for tasks assessed Behavior During Session: [facility] for tasks performed    Extremity/Trunk Assessment Right Upper Extremity Assessment RUE ROM/Strength/Tone:  (bil strength 4/5.  can lift arms to approximately 120) Left Upper Extremity Assessment LUE ROM/Strength/Tone: [facility] for tasks assessed .  Trunk Assessment Trunk Assessment: Other exceptions (pt flexes trunk forward; h/o 2 back sxs)   Mobility  Shoulder Instructions  Bed Mobility Bed Mobility: Supine to Sit Supine to Sit: With rails;HOB elevated;3: Mod assist Details for Bed Mobility Assistance: Pt with hospital bed at home, therefore raised HOB 90deg.  Assist for LE's off of bed and for trunk to attain sitting.  Cues for hand placement and technique, esp to boost hips to EOB.  Transfers Transfers: Sit to Stand Sit to Stand: 3: Mod assist;From elevated surface;With upper extremity assist;From bed Stand to Sit: 4: Min assist;With upper extremity assist;With armrests;To chair/3-in-1 Details for Transfer Assistance: Cues for hand placement and upright posture due to extreme forward flexed posture.        Exercise     Balance Balance Balance Assessed: Yes Static Standing Balance Static Standing - Balance Support: Bilateral upper extremity supported Static Standing - Level of Assistance: 4: Min assist Static Standing - Comment/# of Minutes: pt very shaky.  Stood statically about 2 minutes   End of Session OT - End of Session Equipment Utilized During Treatment: Gait belt (rw) Activity Tolerance: Patient limited by fatigue;Other (comment) (HR) Patient left: in chair;with call bell/phone within reach;with chair alarm set  GO     Ezinne Yogi 06/06/2012, 11:18 AM Marica Otter, OTR/L [phone] 06/06/2012

## 2012-06-06 NOTE — Progress Notes (Signed)
[facility] PROGRESS NOTE  LARK RUNK MVH:[id number] DOB: [birth year] DOA: 06/04/2012 PCP: [name], MD  Assessment/Plan: Principal Problem:  *Bilateral cellulitis of lower leg Active Problems:  Fever  ARF (acute renal failure)  Anemia  Tachycardia  Leukocytosis    Bilateral lower extremity cellulitis -Started on broad-spectrum antibiotics, vancomycin and aztreonam. -Denies any fever or chills today, feels much better.  Acute renal failure -Likely secondary to dehydration and volume depletion. -Started on IV fluids for hydration, creatinine is improving I will decrease the IV fluids rate, follow BMP closely.  Leukocytosis -Secondary to his lower extremity cellulitis, this is resolved after antibiotics instituted.  Anemia -Hemoglobin trending down since June of 2013. -Hemoglobin now is 9.1, expected to decline after IV fluid hydration. -I well check an anemia panel, follow CBC.  Acute delirium -Per nursing staff patient had episode of confusion last night he was very combative. -Likely sundowning complicated by his current infection. -No further neurological workup will be done.  Code Status: Full Family Communication:  Disposition Plan: PT/OT recommended SNF, I will consult the CSW for placement   Brief narrative: 78 year old Caucasian male with past medical history of hypertension and rheumatoid arthritis came in to the hospital with bilateral lower extremity cellulitis and acute renal failure.  Consultants:  None  Procedures:  None  Antibiotics:  Vancomycin started at 06/04/2012.  Aztreonam started at 06/04/2012  HPI/Subjective: Feels much better, denies fever or chills.  Objective: Filed Vitals:   06/05/12 0600 06/05/12 1323 06/05/12 2124 06/06/12 0833  BP: 123/64 99/59 125/67   Pulse: 102 108 112   Temp: 98.2 F (36.8 C) 97.4 F (36.3 C)    TempSrc: Oral Oral Oral   Resp: 16 22 20    Height:      Weight:      SpO2: 100% 96% 94%  95%    Intake/Output Summary (Last 24 hours) at 06/06/12 1231 Last data filed at 06/06/12 0900  Gross per 24 hour  Intake   1360 ml  Output    875 ml  Net    485 ml   Filed Weights   06/04/12 2201  Weight: 79.833 kg (176 lb)    Exam: General: Alert and awake, oriented x3, not in any acute distress. HEENT: anicteric sclera, pupils reactive to light and accommodation, EOMI CVS: S1-S2 clear, no murmur rubs or gallops Chest: clear to auscultation bilaterally, no wheezing, rales or rhonchi Abdomen: soft nontender, nondistended, normal bowel sounds, no organomegaly Extremities: There is redness, swelling and tenderness involving both lower extremities. Neuro: Cranial nerves II-XII intact, no focal neurological deficits  Data Reviewed: Basic Metabolic Panel:  Lab 06/06/12 8413 06/05/12 0523 06/04/12 2040  NA 141 138 137  K 4.1 4.1 5.1  CL 110 106 102  CO2 21 20 20   GLUCOSE 100* 93 101*  BUN 36* 68* 79*  CREATININE 1.15 2.50* 3.30*  CALCIUM 8.1* 8.0* 8.8  MG -- -- --  PHOS -- -- --   Liver Function Tests:  Lab 06/04/12 2040  AST 30  ALT 23  ALKPHOS 114  BILITOT 0.4  PROT 6.3  ALBUMIN 2.3*   No results found for this basename: LIPASE:5,AMYLASE:5 in the last 168 hours No results found for this basename: AMMONIA:5 in the last 168 hours CBC:  Lab 06/05/12 0523 06/04/12 2040  WBC 9.5 12.5*  NEUTROABS -- 10.3*  HGB 9.1* 9.7*  HCT 27.4* 29.6*  MCV 86.7 87.1  PLT 296 328   Cardiac Enzymes: No results found for  this basename: CKTOTAL:5,CKMB:5,CKMBINDEX:5,TROPONINI:5 in the last 168 hours BNP (last 3 results)  Basename 06/05/12 0524 03/06/12 1505  PROBNP 506.6* 173.7   CBG: No results found for this basename: GLUCAP:5 in the last 168 hours  Recent Results (from the past 240 hour(s))  CULTURE, BLOOD (ROUTINE X 2)     Status: Normal (Preliminary result)   Collection Time   06/04/12  8:40 PM      Component Value Range Status Comment   Specimen Description BLOOD  LEFT HAND  5 ML IN [facility] BOTTLE   Final    Special Requests NONE   Final    Culture  Setup Time 06/05/2012 17:10   Final    Culture     Final    Value:        BLOOD CULTURE RECEIVED NO GROWTH TO DATE CULTURE WILL BE HELD FOR 5 DAYS BEFORE ISSUING A FINAL NEGATIVE REPORT   Report Status PENDING   Incomplete   CULTURE, BLOOD (ROUTINE X 2)     Status: Normal (Preliminary result)   Collection Time   06/04/12  9:02 PM      Component Value Range Status Comment   Specimen Description BLOOD LEFT ARM  2.5 ML IN AEROBIC ONLY   Final    Special Requests NONE   Final    Culture  Setup Time 06/05/2012 17:10   Final    Culture     Final    Value:        BLOOD CULTURE RECEIVED NO GROWTH TO DATE CULTURE WILL BE HELD FOR 5 DAYS BEFORE ISSUING A FINAL NEGATIVE REPORT   Report Status PENDING   Incomplete      Studies: Dg Chest Port 1 View  06/06/2012  *RADIOLOGY REPORT*  Clinical Data: Respiratory difficulty, wheezing.  PORTABLE CHEST - 1 VIEW  Comparison: 06/04/2012  Findings: Heart mildly enlarged. Bibasilar densities, right greater than left, likely atelectasis.  Suspect small effusions. Degenerative changes in the shoulders.  IMPRESSION: Borderline heart size.  Bibasilar atelectasis.  Probable small effusions.   Original Report Authenticated By: Cyndie Chime, M.D.    Dg Chest Port 1 View  06/04/2012  *RADIOLOGY REPORT*  Clinical Data: 78 year old male fever bilateral lower extremity redness.  Hypertension.  PORTABLE CHEST - 1 VIEW  Comparison: 04/30/2012 and earlier.  Findings: Semi upright AP portable view at 2020 hours.  Stable lung volumes.  Stable cardiac size and mediastinal contours.  Visualized tracheal air column is within normal limits.  No pneumothorax or edema.  No pleural effusion or acute pulmonary opacity identified. Stable elevation right hemidiaphragm.  IMPRESSION: No acute cardiopulmonary abnormality.   Original Report Authenticated By: Harley Hallmark, M.D.     Scheduled Meds:    . sodium  chloride   Intravenous STAT  . aztreonam  500 mg Intravenous Q8H  . enoxaparin (LOVENOX) injection  40 mg Subcutaneous Q24H  . sodium chloride  3 mL Intravenous Q12H  . vancomycin  1,000 mg Intravenous Q48H   Continuous Infusions:    . sodium chloride 75 mL/hr at 06/06/12 1204  . DISCONTD: sodium chloride 1,000 mL (06/05/12 1218)    Principal Problem:  *Bilateral cellulitis of lower leg Active Problems:  Fever  ARF (acute renal failure)  Anemia  Tachycardia  Leukocytosis    Time spent: 35 minutes    [facility] A  [facility] Pager [phone]. If 8PM-8AM, please contact night-coverage at [email or web address], password [facility] 06/06/2012, 12:31 PM  LOS: 2 days

## 2012-06-06 NOTE — Clinical Social Work Psychosocial (Unsigned)
     Clinical Social Work Department BRIEF PSYCHOSOCIAL ASSESSMENT 06/06/2012  Patient:  [name], [name]     Account Number:  [id number]     Admit date:  06/04/2012  Clinical Social Worker:  Hattie Perch  Date/Time:  06/06/2012 12:00 M  Referred by:  Physician  Date Referred:  06/06/2012 Referred for  SNF Placement  SNF Placement   Other Referral:   Interview type:  Family Other interview type:    PSYCHOSOCIAL DATA Living Status:  FAMILY Admitted from facility:   Level of care:   Primary support name:  Randolm Idol Primary support relationship to patient:  CHILD, ADULT Degree of support available:   good    CURRENT CONCERNS Current Concerns  Post-Acute Placement   Other Concerns:    SOCIAL WORK ASSESSMENT / PLAN Patient is confused. CSW spoke with patients daughter, pam. patient is in need of snf placement. patient usually lives with pam. pam is agreeable to CSW faxing patient out and recieving bed offers.   Assessment/plan status:   Other assessment/ plan:   Information/referral to community resources:    PATIENTS/FAMILYS RESPONSE TO PLAN OF CARE: agreeable to faxing patient out and recieving bed offers for snf placement.

## 2012-06-06 NOTE — Evaluation (Signed)
Physical Therapy Evaluation Patient Details Name: [name] MRN: [id number] DOB: 1928-06-07 Today's Date: 06/06/2012 Time: 4098-1191 PT Time Calculation (min): 32 min  PT Assessment / Plan / Recommendation Clinical Impression  Pt presents with B LE cellulitis.  Tolerated OOB and ambulated a few steps from bed to doorway, however was noted to be very SOB and wheezing.  Ambulated in room on RA with O2 sats in low 90's following amb, however HR in 130's following amb.  RN notified.   Pt will benefit from skilled PT in acute venue to address deficits.  PT recommends SNF for follow up at D/C to increase pt safety.      PT Assessment  Patient needs continued PT services    Follow Up Recommendations  Skilled nursing facility    Barriers to Discharge Decreased caregiver support Daughter not available 24/7    Equipment Recommendations  None recommended by PT;Defer to next venue    Recommendations for Other Services     Frequency Min 3X/week    Precautions / Restrictions Precautions Precautions: Fall Restrictions Weight Bearing Restrictions: No   Pertinent Vitals/Pain 5/10      Mobility  Bed Mobility Bed Mobility: Supine to Sit Supine to Sit: With rails;HOB elevated;3: Mod assist Details for Bed Mobility Assistance: Pt with hospital bed at home, therefore raised HOB 90deg.  Assist for LE's off of bed and for trunk to attain sitting.  Cues for hand placement and technique, esp to boost hips to EOB.  Transfers Transfers: Sit to Stand;Stand to Sit Sit to Stand: 3: Mod assist;From elevated surface;With upper extremity assist;From bed Stand to Sit: 4: Min assist;With upper extremity assist;With armrests;To chair/3-in-1 Details for Transfer Assistance: Cues for hand placement and upright posture due to extreme forward flexed posture.  Ambulation/Gait Ambulation/Gait Assistance: 3: Mod assist Ambulation Distance (Feet): 5 Feet Assistive device: Rolling walker Ambulation/Gait  Assistance Details: Cues for sequencing/technique with RW and to maintain upright posture.  Ambulated on RA with O2 sats in low 90's, however HR in 130's following very little ambulation from bed to door.   Gait Pattern: Step-through pattern;Decreased stride length;Trunk flexed;Wide base of support Gait velocity: decreased General Gait Details: Noted pt to be shaky with amb (really all mobility) Stairs: No Wheelchair Mobility Wheelchair Mobility: No    Exercises     PT Diagnosis: Difficulty walking;Generalized weakness;Acute pain  PT Problem List: Decreased strength;Decreased activity tolerance;Decreased balance;Decreased mobility;Decreased knowledge of use of DME;Pain PT Treatment Interventions: DME instruction;Gait training;Functional mobility training;Therapeutic activities;Therapeutic exercise;Balance training;Patient/family education   PT Goals Acute Rehab PT Goals PT Goal Formulation: With patient Time For Goal Achievement: 06/13/12 Potential to Achieve Goals: Good Pt will go Supine/Side to Sit: with supervision PT Goal: Supine/Side to Sit - Progress: Goal set today Pt will go Sit to Supine/Side: with supervision PT Goal: Sit to Supine/Side - Progress: Goal set today Pt will go Sit to Stand: with supervision PT Goal: Sit to Stand - Progress: Goal set today Pt will go Stand to Sit: with supervision PT Goal: Stand to Sit - Progress: Goal set today Pt will Ambulate: 16 - 50 feet;with min assist;with least restrictive assistive device PT Goal: Ambulate - Progress: Goal set today  Visit Information  Last PT Received On: 06/06/12 Assistance Needed: +2 (+2 for chair follow w/ amb) PT/OT Co-Evaluation/Treatment: Yes    Subjective Data  Subjective: I had a rough night.  [name]   Prior Functioning  Home Living Lives With: Alone  Available Help at Discharge:  (daughter cooks meals) Type of Home: House Home Access: Stairs to enter [employer] of Steps: 3 Entrance Stairs-Rails: Can reach both Home Layout: One level Bathroom Shower/Tub: Walk-in shower (grab bars) Bathroom Toilet: Handicapped height Home Adaptive Equipment: Walker - rolling;Shower chair without back;Hospital bed (recently back in hospital bed; had used recliner) Prior Function Level of Independence: Independent with assistive device(s) Comments: help for iadls Communication Communication: No difficulties Dominant Hand: Right    Cognition  Overall Cognitive Status: Appears within functional limits for tasks assessed/performed Arousal/Alertness: Awake/alert Orientation Level: Appears intact for tasks assessed Behavior During Session: [facility] for tasks performed    Extremity/Trunk Assessment Right Upper Extremity Assessment RUE ROM/Strength/Tone:  (bil strength 4/5.  can lift arms to approximately 120) Left Upper Extremity Assessment LUE ROM/Strength/Tone: WFL for tasks assessed Right Lower Extremity Assessment RLE ROM/Strength/Tone: Deficits RLE ROM/Strength/Tone Deficits: Unable to formally test due to pain, grossly 3/5 per functional assessment.  Left Lower Extremity Assessment LLE ROM/Strength/Tone: Deficits LLE ROM/Strength/Tone Deficits: Unable to formally test due to pain, grossly 3/5 per functional assessment.  Trunk Assessment Trunk Assessment: Other exceptions (pt flexes trunk forward; h/o 2 back sxs)   Balance Balance Balance Assessed: Yes Static Standing Balance Static Standing - Balance Support: Bilateral upper extremity supported Static Standing - Level of Assistance: 4: Min assist Static Standing - Comment/# of Minutes: pt very shaky. Stood statically about 2 minutes  End of Session PT - End of Session Equipment Utilized During Treatment: Gait belt Activity Tolerance: Patient limited by fatigue;Patient limited by pain Patient left: in chair;with call bell/phone within reach;with chair alarm set Nurse Communication: Mobility  status  GP     Page, Meribeth Mattes 06/06/2012, 11:05 AM

## 2012-06-06 NOTE — Consult Note (Signed)
WOC consult Note Reason for Consult:Patient presents with mild weeping of erythematous LE's.  Erythema is circumferential and from patellar notch to medial malleolus, bilaterally. WTA  RN saw patient and implemented appropriate, conservative POC with which I agree. Wound type: Infectious, suspected vascular insufficiency Pressure Ulcer POA: No Measurement:Circumferential LEs Wound bed:No open wounds noted; small open areas weeping serous fluid in small to moderate amounts.  LE's are elevated and painful. Drainage (amount, consistency, odor) See above. Dressing procedure/placement/frequency: LEs cleansed with NS and this caused some discomfort to patient.  Pat gently dry.  Petrolatum gauze to cover LEs, topped with ABDs and secured with Kerlix wrap/tape.  Patient reports improved comfort.  Will add Prevalon boots. I will not follow.  Please re-consult if needed. Thanks, Ladona Mow, MSN, RN, [facility], CWOCN [phone])

## 2012-06-07 ENCOUNTER — Inpatient Hospital Stay (HOSPITAL_COMMUNITY): Payer: Medicare Other

## 2012-06-07 ENCOUNTER — Encounter (HOSPITAL_COMMUNITY): Payer: Self-pay | Admitting: Radiology

## 2012-06-07 DIAGNOSIS — M069 Rheumatoid arthritis, unspecified: Secondary | ICD-10-CM

## 2012-06-07 LAB — BASIC METABOLIC PANEL
BUN: 23 mg/dL (ref 6–23)
Calcium: 8.1 mg/dL — ABNORMAL LOW (ref 8.4–10.5)
GFR calc Af Amer: 88 mL/min — ABNORMAL LOW (ref >90)
GFR calc non Af Amer: 76 mL/min — ABNORMAL LOW (ref >90)
Glucose, Bld: 92 mg/dL (ref 70–99)
Sodium: 142 mEq/L (ref 135–145)

## 2012-06-07 MED ORDER — VANCOMYCIN HCL 1000 MG IV SOLR
750.0000 mg | Freq: Two times a day (BID) | INTRAVENOUS | Status: DC
  Administered 2012-06-07 – 2012-06-08 (×2): 750 mg via INTRAVENOUS
  Filled 2012-06-07 (×3): qty 750

## 2012-06-07 MED ORDER — DEXTROSE 5 % IV SOLN
1.0000 g | Freq: Three times a day (TID) | INTRAVENOUS | Status: AC
  Administered 2012-06-07 – 2012-06-08 (×5): 1 g via INTRAVENOUS
  Filled 2012-06-07 (×5): qty 1

## 2012-06-07 MED ORDER — IOHEXOL 300 MG/ML  SOLN
100.0000 mL | Freq: Once | INTRAMUSCULAR | Status: AC | PRN
  Administered 2012-06-07: 100 mL via INTRAVENOUS

## 2012-06-07 NOTE — Progress Notes (Signed)
[facility] PROGRESS NOTE  [name] WUJ:[id number] DOB: 02-06-1928 DOA: 06/04/2012 PCP: [name], MD  Assessment/Plan: Principal Problem:  *Bilateral cellulitis of lower leg Active Problems:  Fever  ARF (acute renal failure)  Anemia  Tachycardia  Leukocytosis    Bilateral lower extremity cellulitis -Started on broad-spectrum antibiotics, vancomycin and aztreonam. -Denies any fever or chills today, feels much better.  Acute renal failure -Likely secondary to dehydration and volume depletion. -This is resolved with aggressive IV fluid hydration..  Abdominal pain -Patient is mentioning lower abdominal pain, feeling that for the past several days. -On exam he has moderate tenderness. I'll obtain CT scan.  Leukocytosis -Secondary to his lower extremity cellulitis, this is resolved after antibiotics instituted. This is resolved.  Anemia -Hemoglobin trending down since June of 2013. -Hemoglobin now is 9.1, expected to decline after IV fluid hydration. -Anemia panel showed anemia of chronic disease with low iron, I will add iron supplement.  Acute delirium -Per nursing staff patient had episode of confusion last night he was very combative. -Likely sundowning complicated by his current infection. -No further neurological workup will be done.  Code Status: Full Family Communication:  Disposition Plan: PT/OT recommended SNF, I will consult the CSW for placement   Brief narrative: 78 year old Caucasian male with past medical history of hypertension and rheumatoid arthritis came in to the hospital with bilateral lower extremity cellulitis and acute renal failure.  Consultants:  None  Procedures:  None  Antibiotics:  Vancomycin started at 06/04/2012.  Aztreonam started at 06/04/2012  HPI/Subjective: Feels much better, denies fever or chills.  Objective: Filed Vitals:   06/05/12 2124 06/06/12 0833 06/06/12 1515 06/06/12 2142  BP: 125/67  110/70  136/72  Pulse: 112  89 100  Temp:   98 F (36.7 C) 97.7 F (36.5 C)  TempSrc: Oral   Oral  Resp: 20  15 20   Height:      Weight:      SpO2: 94% 95% 96% 97%    Intake/Output Summary (Last 24 hours) at 06/07/12 1147 Last data filed at 06/07/12 9562  Gross per 24 hour  Intake   1670 ml  Output    500 ml  Net   1170 ml   Filed Weights   06/04/12 2201  Weight: 79.833 kg (176 lb)    Exam: General: Alert and awake, oriented x3, not in any acute distress. HEENT: anicteric sclera, pupils reactive to light and accommodation, EOMI CVS: S1-S2 clear, no murmur rubs or gallops Chest: clear to auscultation bilaterally, no wheezing, rales or rhonchi Abdomen: soft nontender, nondistended, normal bowel sounds, no organomegaly Extremities: There is redness, swelling and tenderness involving both lower extremities. Neuro: Cranial nerves II-XII intact, no focal neurological deficits  Data Reviewed: Basic Metabolic Panel:  Lab 06/07/12 1308 06/06/12 0954 06/05/12 0523 06/04/12 2040  NA 142 141 138 137  K 3.8 4.1 4.1 5.1  CL 109 110 106 102  CO2 22 21 20 20   GLUCOSE 92 100* 93 101*  BUN 23 36* 68* 79*  CREATININE 0.96 1.15 2.50* 3.30*  CALCIUM 8.1* 8.1* 8.0* 8.8  MG -- -- -- --  PHOS -- -- -- --   Liver Function Tests:  Lab 06/04/12 2040  AST 30  ALT 23  ALKPHOS 114  BILITOT 0.4  PROT 6.3  ALBUMIN 2.3*   No results found for this basename: LIPASE:5,AMYLASE:5 in the last 168 hours No results found for this basename: AMMONIA:5 in the last 168 hours CBC:  Lab  06/05/12 0523 06/04/12 2040  WBC 9.5 12.5*  NEUTROABS -- 10.3*  HGB 9.1* 9.7*  HCT 27.4* 29.6*  MCV 86.7 87.1  PLT 296 328   Cardiac Enzymes: No results found for this basename: CKTOTAL:5,CKMB:5,CKMBINDEX:5,TROPONINI:5 in the last 168 hours BNP (last 3 results)  Basename 06/05/12 0524 03/06/12 1505  PROBNP 506.6* 173.7   CBG: No results found for this basename: GLUCAP:5 in the last 168 hours  Recent Results  (from the past 240 hour(s))  CULTURE, BLOOD (ROUTINE X 2)     Status: Normal (Preliminary result)   Collection Time   06/04/12  8:40 PM      Component Value Range Status Comment   Specimen Description BLOOD LEFT HAND  5 ML IN [facility] BOTTLE   Final    Special Requests NONE   Final    Culture  Setup Time 06/05/2012 17:10   Final    Culture     Final    Value:        BLOOD CULTURE RECEIVED NO GROWTH TO DATE CULTURE WILL BE HELD FOR 5 DAYS BEFORE ISSUING A FINAL NEGATIVE REPORT   Report Status PENDING   Incomplete   URINE CULTURE     Status: Normal   Collection Time   06/04/12  8:47 PM      Component Value Range Status Comment   Specimen Description URINE, CLEAN CATCH   Final    Special Requests NONE   Final    Culture  Setup Time 06/05/2012 14:03   Final    Colony Count NO GROWTH   Final    Culture NO GROWTH   Final    Report Status 06/06/2012 FINAL   Final   CULTURE, BLOOD (ROUTINE X 2)     Status: Normal (Preliminary result)   Collection Time   06/04/12  9:02 PM      Component Value Range Status Comment   Specimen Description BLOOD LEFT ARM  2.5 ML IN AEROBIC ONLY   Final    Special Requests NONE   Final    Culture  Setup Time 06/05/2012 17:10   Final    Culture     Final    Value:        BLOOD CULTURE RECEIVED NO GROWTH TO DATE CULTURE WILL BE HELD FOR 5 DAYS BEFORE ISSUING A FINAL NEGATIVE REPORT   Report Status PENDING   Incomplete      Studies: Dg Chest Port 1 View  06/06/2012  *RADIOLOGY REPORT*  Clinical Data: Respiratory difficulty, wheezing.  PORTABLE CHEST - 1 VIEW  Comparison: 06/04/2012  Findings: Heart mildly enlarged. Bibasilar densities, right greater than left, likely atelectasis.  Suspect small effusions. Degenerative changes in the shoulders.  IMPRESSION: Borderline heart size.  Bibasilar atelectasis.  Probable small effusions.   Original Report Authenticated By: Cyndie Chime, M.D.     Scheduled Meds:    . aztreonam  500 mg Intravenous Q8H  . enoxaparin (LOVENOX)  injection  40 mg Subcutaneous Q24H  . sodium chloride  3 mL Intravenous Q12H  . vancomycin  1,000 mg Intravenous Q48H   Continuous Infusions:    . DISCONTD: sodium chloride 75 mL/hr at 06/06/12 1204    Principal Problem:  *Bilateral cellulitis of lower leg Active Problems:  Fever  ARF (acute renal failure)  Anemia  Tachycardia  Leukocytosis    Time spent: 35 minutes    [facility]  [facility] Pager [phone]. If 8PM-8AM, please contact night-coverage at [email or web address], password [facility] 06/07/2012, 11:47  AM  LOS: 3 days

## 2012-06-07 NOTE — Progress Notes (Signed)
ANTIBIOTIC CONSULT NOTE - FOLLOW UP  Pharmacy Consult for Vancomycin/Aztreonam Indication: BLE cellulitis  Allergies  Allergen Reactions  . Codeine Other (See Comments)     drives me crazy  . Hydrocodone Other (See Comments)    Makes patient hallucinate, cannot sleep  . Oxycodone Other (See Comments)    Makes patient hallucinate, cannot sleep  . Penicillins Hives    Patient Measurements: Height: 5\' 9"  (175.3 cm) Weight: 176 lb (79.833 kg) IBW/kg (Calculated) : 70.7   Vital Signs:   Intake/Output from previous day: 09/09 0701 - 09/10 0700 In: 1910 [P.O.:360; I.V.:1200; IV Piggyback:350] Out: 600 [Urine:600] Intake/Output from this shift:    Labs:  Basename 06/07/12 0427 06/06/12 0954 06/05/12 0523 06/04/12 2040  WBC -- -- 9.5 12.5*  HGB -- -- 9.1* 9.7*  PLT -- -- 296 328  LABCREA -- -- -- --  CREATININE 0.96 1.15 2.50* --   Estimated Creatinine Clearance: 60.3 ml/min (by C-G formula based on Cr of 0.96).   Microbiology: Recent Results (from the past 720 hour(s))  CULTURE, BLOOD (ROUTINE X 2)     Status: Normal (Preliminary result)   Collection Time   06/04/12  8:40 PM      Component Value Range Status Comment   Specimen Description BLOOD LEFT HAND  5 ML IN [facility] BOTTLE   Final    Special Requests NONE   Final    Culture  Setup Time 06/05/2012 17:10   Final    Culture     Final    Value:        BLOOD CULTURE RECEIVED NO GROWTH TO DATE CULTURE WILL BE HELD FOR 5 DAYS BEFORE ISSUING A FINAL NEGATIVE REPORT   Report Status PENDING   Incomplete   URINE CULTURE     Status: Normal   Collection Time   06/04/12  8:47 PM      Component Value Range Status Comment   Specimen Description URINE, CLEAN CATCH   Final    Special Requests NONE   Final    Culture  Setup Time 06/05/2012 14:03   Final    Colony Count NO GROWTH   Final    Culture NO GROWTH   Final    Report Status 06/06/2012 FINAL   Final   CULTURE, BLOOD (ROUTINE X 2)     Status: Normal (Preliminary result)   Collection Time   06/04/12  9:02 PM      Component Value Range Status Comment   Specimen Description BLOOD LEFT ARM  2.5 ML IN AEROBIC ONLY   Final    Special Requests NONE   Final    Culture  Setup Time 06/05/2012 17:10   Final    Culture     Final    Value:        BLOOD CULTURE RECEIVED NO GROWTH TO DATE CULTURE WILL BE HELD FOR 5 DAYS BEFORE ISSUING A FINAL NEGATIVE REPORT   Report Status PENDING   Incomplete     Anti-infectives     Start     Dose/Rate Route Frequency Ordered Stop   06/06/12 2200   vancomycin (VANCOCIN) IVPB 1000 mg/200 mL premix        1,000 mg 200 mL/hr over 60 Minutes Intravenous Every 48 hours 06/04/12 2212     06/05/12 0600   aztreonam (AZACTAM) 500 mg in dextrose 5 % 50 mL IVPB        500 mg 100 mL/hr over 30 Minutes Intravenous 3 times per day 06/04/12  2212     06/04/12 2145   aztreonam (AZACTAM) 2 g in dextrose 5 % 50 mL IVPB        2 g 100 mL/hr over 30 Minutes Intravenous  Once 06/04/12 2145 06/04/12 2310   06/04/12 2145   vancomycin (VANCOCIN) IVPB 1000 mg/200 mL premix        1,000 mg 200 mL/hr over 60 Minutes Intravenous  Once 06/04/12 2145 06/04/12 2340          Assessment:  83 YOM admitted 9/7 with bilateral cellulitis of lower leg  ARF on admit, now improving with hydration, CrCl ~48ml/min  Day #4 Vanc 1g q48h, Aztreonam 500mg  q8h  Goal of Therapy:  Vancomycin trough level 10-15 mcg/ml  Plan:   Increase Vancomycin to 750mg  IV q12h  Check trough at new steady state if continues  Increase Aztreonam to 1gm IV q8h Follow up renal function & cultures   Loralee Pacas, PharmD, BCPS Pager: [phone] 06/07/2012,11:58 AM

## 2012-06-07 NOTE — Progress Notes (Signed)
Physical Therapy Treatment Patient Details Name: [name] MRN: [id number] DOB: June 09, 1928 Today's Date: 06/07/2012 Time: 1120-1140 PT Time Calculation (min): 20 min  PT Assessment / Plan / Recommendation Comments on Treatment Session  Pt very willing to try but demon limited activity tolerance.  Assisted pt OOB to [facility] 2nd loose stools x 2 episodes.  Attempted amb however unable to progress more that 3' 2nd dyspnea.  Positioned in recliner and reapllied 2 lts nasal.    Follow Up Recommendations  Skilled nursing facility    Barriers to Discharge        Equipment Recommendations  Defer to next venue    Recommendations for Other Services    Frequency     Plan Discharge plan remains appropriate    Precautions / Restrictions Precautions Precautions: Fall    Pertinent Vitals/Pain No c/o pain    Mobility  Bed Mobility Bed Mobility: Supine to Sit Supine to Sit: 3: Mod assist;With rails;HOB elevated Details for Bed Mobility Assistance: increased time  Transfers Transfers: Sit to Stand;Stand to Sit Sit to Stand: 3: Mod assist;From elevated surface;From toilet;From bed Stand to Sit: 4: Min assist;To chair/3-in-1;To toilet Details for Transfer Assistance: 50% VC's on hand placement to increase push off and safety with turn completion using RW prior to sit  Ambulation/Gait Ambulation/Gait Assistance: 1: +2 Total assist Ambulation/Gait: Patient Percentage: 80% Ambulation Distance (Feet): 3 Feet Assistive device: Rolling walker Ambulation/Gait Assistance Details: Amb distance limited by severe dyspnea and cont, loose stools.  RA during exersion 92% with HR 100.  Pt demon 3/4 DOE. Gait Pattern: Step-to pattern;Decreased stride length;Trunk flexed Gait velocity: decreased     PT Goals                        progressing    Visit Information  Last PT Received On: 06/07/12 Assistance Needed: +2    Subjective Data  Subjective: I feel so bad   Cognition    good    Balance   poor+  End of Session PT - End of Session Equipment Utilized During Treatment: Gait belt Activity Tolerance: Treatment limited secondary to medical complications (Comment);Other (comment) (dyspnea/loose stools)   Felecia Shelling  PTA [facility]  Acute  Rehab Pager     [phone]

## 2012-06-07 NOTE — Progress Notes (Signed)
CSW met with patient. Patient received bed offers and will discuss them with patient's daughter. Left patient's daughter, pam, voicemail.  Tamyka Bezio C. Jaxson Keener MSW, LCSW [phone]

## 2012-06-08 DIAGNOSIS — D72829 Elevated white blood cell count, unspecified: Secondary | ICD-10-CM

## 2012-06-08 DIAGNOSIS — R5381 Other malaise: Secondary | ICD-10-CM | POA: Diagnosis present

## 2012-06-08 LAB — BASIC METABOLIC PANEL
BUN: 26 mg/dL — ABNORMAL HIGH (ref 6–23)
Creatinine, Ser: 1.85 mg/dL — ABNORMAL HIGH (ref 0.50–1.35)
GFR calc Af Amer: 38 mL/min — ABNORMAL LOW (ref >90)
GFR calc non Af Amer: 33 mL/min — ABNORMAL LOW (ref >90)
Potassium: 4.1 mEq/L (ref 3.5–5.1)

## 2012-06-08 LAB — CBC
MCHC: 32.7 g/dL (ref 30.0–36.0)
RDW: 15.5 % (ref 11.5–15.5)

## 2012-06-08 LAB — MAGNESIUM: Magnesium: 1.2 mg/dL — ABNORMAL LOW (ref 1.5–2.5)

## 2012-06-08 MED ORDER — INFLUENZA VIRUS VACC SPLIT PF IM SUSP
0.5000 mL | INTRAMUSCULAR | Status: AC
  Administered 2012-06-09: 0.5 mL via INTRAMUSCULAR
  Filled 2012-06-08: qty 0.5

## 2012-06-08 MED ORDER — FERROUS FUMARATE 325 (106 FE) MG PO TABS
1.0000 | ORAL_TABLET | Freq: Two times a day (BID) | ORAL | Status: DC
  Administered 2012-06-08 – 2012-06-13 (×11): 106 mg via ORAL
  Filled 2012-06-08 (×12): qty 1

## 2012-06-08 MED ORDER — DOXYCYCLINE HYCLATE 100 MG PO TABS
100.0000 mg | ORAL_TABLET | Freq: Two times a day (BID) | ORAL | Status: DC
  Administered 2012-06-09 – 2012-06-13 (×9): 100 mg via ORAL
  Filled 2012-06-08 (×10): qty 1

## 2012-06-08 MED ORDER — VANCOMYCIN HCL 1000 MG IV SOLR: 750.0000 mg | INTRAVENOUS | Status: DC

## 2012-06-08 MED ORDER — METOCLOPRAMIDE HCL 5 MG PO TABS
5.0000 mg | ORAL_TABLET | Freq: Three times a day (TID) | ORAL | Status: DC
  Administered 2012-06-08 – 2012-06-13 (×15): 5 mg via ORAL
  Filled 2012-06-08 (×17): qty 1

## 2012-06-08 MED ORDER — PANTOPRAZOLE SODIUM 40 MG PO TBEC
40.0000 mg | DELAYED_RELEASE_TABLET | Freq: Every day | ORAL | Status: DC
  Administered 2012-06-08 – 2012-06-13 (×6): 40 mg via ORAL
  Filled 2012-06-08 (×6): qty 1

## 2012-06-08 MED ORDER — MAGNESIUM SULFATE 40 MG/ML IJ SOLN
2.0000 g | Freq: Once | INTRAMUSCULAR | Status: AC
  Administered 2012-06-08: 2 g via INTRAVENOUS
  Filled 2012-06-08: qty 50

## 2012-06-08 MED ORDER — SODIUM CHLORIDE 0.9 % IV SOLN
INTRAVENOUS | Status: AC
  Administered 2012-06-08: 14:00:00 via INTRAVENOUS

## 2012-06-08 NOTE — Progress Notes (Signed)
[facility] PROGRESS NOTE  SAMPSON SELF ZOX:[id number] DOB: October 07, 1927 DOA: 06/04/2012 PCP: [name], MD  Assessment/Plan: Principal Problem:  *Bilateral cellulitis of lower leg Active Problems:  Fever  ARF (acute renal failure)  Anemia  Tachycardia  Leukocytosis    Bilateral lower extremity cellulitis -Started on broad-spectrum antibiotics (vancomycin and aztreonam) on admission; now after 5 days of treatment infection is better. -Denies any fever or chills today. -will transition to PO abx's (plan is to complete a total of 10 days).  Acute renal failure -Likely secondary to dehydration and volume depletion. -patient stop eating about 2 days ago due to nausea and abdominal discomfort; making him to be mild dehydrated again; Cr going up again. -Will give some IV challenge -Will discontinue vancomycin at this point. -BMET in am  Abdominal pain -CT scan no demonstrating any acute abnormality. -Will start low dose reglan to help with nausea, start po protonix and follow symptoms now that abx's will be transition to PO.  Leukocytosis -Secondary to his lower extremity cellulitis, this is resolved after antibiotics instituted.   Anemia -Hemoglobin trending down since June of 2013. -Hemoglobin now is 9.6; will monitor. No signs of acute bleeding. -Anemia panel showed anemia of chronic disease with low iron, will start hemocyte.  Acute delirium -resolved. -attributed to sundowning -Patient now AAOX3 and appropriate.  Deconditioning -SNF for rehab at discharge.   Code Status: Full Family Communication:  Disposition Plan: PT/OT recommended SNF, I will consult the CSW for placement   Brief narrative: 78 year old Caucasian male with past medical history of hypertension and rheumatoid arthritis came in to the hospital with bilateral lower extremity cellulitis and acute renal failure.  Consultants:  None  Procedures:  None  Antibiotics:  Vancomycin  started at 06/04/2012.  Aztreonam started at 06/04/2012  HPI/Subjective: Feels better, denies fever or chills. Complaining os generalized weakness and also some nausea, making him unable to eat.  Objective: Filed Vitals:   06/07/12 1300 06/07/12 2134 06/07/12 2200 06/08/12 0522  BP: 123/69 173/81 160/80 135/90  Pulse: 84 96  112  Temp: 98 F (36.7 C) 98 F (36.7 C)  98.3 F (36.8 C)  TempSrc:  Oral  Oral  Resp: 16 20  18   Height:      Weight:      SpO2: 98% 97%  98%    Intake/Output Summary (Last 24 hours) at 06/08/12 1355 Last data filed at 06/08/12 0200  Gross per 24 hour  Intake   1200 ml  Output      0 ml  Net   1200 ml   Filed Weights   06/04/12 2201  Weight: 79.833 kg (176 lb)    Exam: General: Alert and awake, oriented x3, not in any acute distress. HEENT: anicteric sclera, pupils reactive to light and accommodation, EOMI CVS: S1-S2 clear, no murmur rubs or gallops Chest: clear to auscultation bilaterally, no wheezing, rales or rhonchi Abdomen: soft nontender, nondistended, normal bowel sounds, no organomegaly Extremities: There is redness, swelling and tenderness involving both lower extremities. Neuro: Cranial nerves II-XII intact, no focal neurological deficits  Data Reviewed: Basic Metabolic Panel:  Lab 06/08/12 8119 06/07/12 0427 06/06/12 0954 06/05/12 0523 06/04/12 2040  NA 140 142 141 138 137  K 4.1 3.8 4.1 4.1 5.1  CL 108 109 110 106 102  CO2 20 22 21 20 20   GLUCOSE 88 92 100* 93 101*  BUN 26* 23 36* 68* 79*  CREATININE 1.85* 0.96 1.15 2.50* 3.30*  CALCIUM 8.3* 8.1*  8.1* 8.0* 8.8  MG 1.2* -- -- -- --  PHOS -- -- -- -- --   Liver Function Tests:  Lab 06/04/12 2040  AST 30  ALT 23  ALKPHOS 114  BILITOT 0.4  PROT 6.3  ALBUMIN 2.3*   CBC:  Lab 06/08/12 0355 06/05/12 0523 06/04/12 2040  WBC 8.0 9.5 12.5*  NEUTROABS -- -- 10.3*  HGB 9.6* 9.1* 9.7*  HCT 29.4* 27.4* 29.6*  MCV 87.8 86.7 87.1  PLT 297 296 328   BNP (last 3  results)  Basename 06/05/12 0524 03/06/12 1505  PROBNP 506.6* 173.7     Recent Results (from the past 240 hour(s))  CULTURE, BLOOD (ROUTINE X 2)     Status: Normal (Preliminary result)   Collection Time   06/04/12  8:40 PM      Component Value Range Status Comment   Specimen Description BLOOD LEFT HAND  5 ML IN [facility] BOTTLE   Final    Special Requests NONE   Final    Culture  Setup Time 06/05/2012 17:10   Final    Culture     Final    Value:        BLOOD CULTURE RECEIVED NO GROWTH TO DATE CULTURE WILL BE HELD FOR 5 DAYS BEFORE ISSUING A FINAL NEGATIVE REPORT   Report Status PENDING   Incomplete   URINE CULTURE     Status: Normal   Collection Time   06/04/12  8:47 PM      Component Value Range Status Comment   Specimen Description URINE, CLEAN CATCH   Final    Special Requests NONE   Final    Culture  Setup Time 06/05/2012 14:03   Final    Colony Count NO GROWTH   Final    Culture NO GROWTH   Final    Report Status 06/06/2012 FINAL   Final   CULTURE, BLOOD (ROUTINE X 2)     Status: Normal (Preliminary result)   Collection Time   06/04/12  9:02 PM      Component Value Range Status Comment   Specimen Description BLOOD LEFT ARM  2.5 ML IN AEROBIC ONLY   Final    Special Requests NONE   Final    Culture  Setup Time 06/05/2012 17:10   Final    Culture     Final    Value:        BLOOD CULTURE RECEIVED NO GROWTH TO DATE CULTURE WILL BE HELD FOR 5 DAYS BEFORE ISSUING A FINAL NEGATIVE REPORT   Report Status PENDING   Incomplete      Studies: Ct Abdomen Pelvis W Contrast  06/07/2012  *RADIOLOGY REPORT*  Clinical Data: Lower abdominal pain.  CT ABDOMEN AND PELVIS WITH CONTRAST  Technique:  Multidetector CT imaging of the abdomen and pelvis was performed following the standard protocol during bolus administration of intravenous contrast.  Contrast: OMNIPAQUE IOHEXOL 300 MG/ML  SOLN  Comparison: Abdominal CT 04/26/2009 and chest CT 10/12/2011  Findings: Again noted are small scattered  pulmonary nodules in the lower lungs.  Etiology of these nodules is unknown.  There is volume loss at the posterior left lung base. There is a small amount of pleural fluid.  No evidence for free air.  Study is limited due to excessive patient motion.  There may be prominent vessels or calcifications  involving the gallbladder wall.  There is no significant biliary dilatation.  No gross abnormality to the liver or spleen.  Fluid within the  urinary bladder and there may be a few bladder diverticula.  No significant free fluid.  Fullness of the renal collecting systems bilaterally and this may be related to urinary bladder distention.  Evidence for small renal cortical cysts.  There is no significant contrast within the kidneys on the delayed images.  The patient has a ventral hernia that extends towards the right side.  There are bowel structures that extend to the ventral hernia.  No evidence for bowel obstruction.  A small calcification near the left ureterovesical junction but this is probably not causing obstruction and unclear if it is actually within the bladder or collecting system.  Patient has had previous back surgery.  There is enlargement of a calcified extruded disc along the right side of this central canal at L1 and L2.  This appears to be causing significant narrowing of the central canal.  This disc has enlarged since the MRI in 2011. There are marked degenerative changes in the right hip. Anterolisthesis at L5-S1.  IMPRESSION:  Ventral hernia containing bowel but no evidence for obstruction.  Fullness in the renal collecting system bilaterally and fullness of the urinary bladder.  Findings may represent bladder outlet obstruction.  Cannot exclude a small stone near the left ureterovesical junction as described.  The study is limited due to excessive patient motion.  Again noted are small pulmonary nodules of unknown etiology.  Enlargement of a calcified protruding disc at L1-L2 which is causing  significant narrowing of the central spinal canal.   Original Report Authenticated By: Richarda Overlie, M.D.     Scheduled Meds:    . aztreonam  1 g Intravenous Q8H  . doxycycline  100 mg Oral Q12H  . enoxaparin (LOVENOX) injection  40 mg Subcutaneous Q24H  . magnesium sulfate 1 - 4 g bolus IVPB  2 g Intravenous Once  . metoCLOPramide  5 mg Oral TID AC  . pantoprazole  40 mg Oral Q1200  . sodium chloride  3 mL Intravenous Q12H  . DISCONTD: vancomycin  750 mg Intravenous Q12H  . DISCONTD: vancomycin  750 mg Intravenous Q24H   Continuous Infusions:    . sodium chloride      Time spent: > 30 minutes    Skie Vitrano  [facility] Pager [phone]. If 8PM-8AM, please contact night-coverage at [email or web address], password [facility] 06/08/2012, 1:55 PM  LOS: 4 days

## 2012-06-08 NOTE — Progress Notes (Signed)
Physical Therapy Treatment Patient Details Name: [name] MRN: [id number] DOB: Jan 28, 1928 Today's Date: 06/08/2012 Time: 7829-5621 PT Time Calculation (min): 31 min  PT Assessment / Plan / Recommendation Comments on Treatment Session  Pt continues to be willing to try to participate with therapy, however pt continues to have loose stools and required assist with cleaning.  Assisted pt to 3in1 and chair with noted SOB and weezing.  O2 sats at 92% following mobility on 3LO2.      Follow Up Recommendations  Skilled nursing facility    Barriers to Discharge        Equipment Recommendations  Defer to next venue    Recommendations for Other Services    Frequency Min 3X/week   Plan Discharge plan remains appropriate    Precautions / Restrictions Precautions Precautions: Fall Restrictions Weight Bearing Restrictions: No   Pertinent Vitals/Pain No pain, just weezing and SOB.  RN notified     Mobility  Bed Mobility Bed Mobility: Supine to Sit;Rolling Right;Rolling Left Rolling Right: 5: Supervision Rolling Left: 5: Supervision Supine to Sit: 3: Mod assist;HOB elevated;With rails Details for Bed Mobility Assistance: Performed rolling R and L x 2 each in order to assist with cleaning pt with cues for hand placement/technique.  Requires some assist for B LEs off bed with cues for technique and hand placement.  Noted pt to be very weezy upon getting to EOB.  Transfers Transfers: Sit to Stand;Stand to Sit;Stand Pivot Transfers Sit to Stand: 1: +2 Total assist;With upper extremity assist;With armrests;From bed;From chair/3-in-1 Sit to Stand: Patient Percentage: 50% Stand to Sit: 1: +2 Total assist;With upper extremity assist;With armrests;To chair/3-in-1 Stand to Sit: Patient Percentage: 60% Stand Pivot Transfers: 1: +2 Total assist Stand Pivot Transfers: Patient Percentage: 70% Details for Transfer Assistance: Assist to rise and stabalize with cues for hand placement and safety  when sitting/standing.  Took some steps from bed to 3in1 and then to chair, however noted increased SOB and weezing, therefore deferred further ambulation.  Ambulation/Gait Ambulation/Gait Assistance: 1: +2 Total assist Ambulation/Gait: Patient Percentage: 70% Ambulation Distance (Feet): 4 Feet Assistive device: Rolling walker Ambulation/Gait Assistance Details: See assist/cues for transfer above Gait Pattern: Step-to pattern;Decreased stride length;Trunk flexed Gait velocity: decreased    Exercises     PT Diagnosis:    PT Problem List:   PT Treatment Interventions:     PT Goals Acute Rehab PT Goals PT Goal Formulation: With patient Time For Goal Achievement: 06/13/12 Potential to Achieve Goals: Good Pt will go Supine/Side to Sit: with supervision PT Goal: Supine/Side to Sit - Progress: Progressing toward goal Pt will go Sit to Stand: with supervision PT Goal: Sit to Stand - Progress: Progressing toward goal Pt will go Stand to Sit: with supervision PT Goal: Stand to Sit - Progress: Progressing toward goal Pt will Ambulate: 16 - 50 feet;with min assist;with least restrictive assistive device PT Goal: Ambulate - Progress: Progressing toward goal  Visit Information  Last PT Received On: 06/08/12 Assistance Needed: +2    Subjective Data  Subjective: I've been weezing all night.  [name]   Cognition  Overall Cognitive Status: Appears within functional limits for tasks assessed/performed Arousal/Alertness: Awake/alert Orientation Level: Appears intact for tasks assessed Behavior During Session: [facility] for tasks performed    Balance     End of Session PT - End of Session Equipment Utilized During Treatment: Gait belt Activity Tolerance: Treatment limited secondary to medical complications (Comment);Other (comment) (SOB, weezing)  Patient left: in chair;with call bell/phone within reach;with chair alarm set Nurse Communication: Mobility  status   GP     Page, Meribeth Mattes 06/08/2012, 12:18 PM

## 2012-06-08 NOTE — Care Management Note (Signed)
    Page 1 of 1   06/13/2012     2:50:10 PM   CARE MANAGEMENT NOTE 06/13/2012  Patient:  [name], [name]   Account Number:  [id number]  Date Initiated:  06/08/2012  Documentation initiated by:  Lanier Clam  Subjective/Objective Assessment:   ADMITTED W/BILAT LWR LEG CELLULITIS.     Action/Plan:   FROM HOME   Anticipated DC Date:  06/13/2012   Anticipated DC Plan:  SKILLED NURSING FACILITY  In-house referral  Clinical Social Worker      DC Planning Services  CM consult      Choice offered to / List presented to:             Status of service:  Completed, signed off Medicare Important Message given?   (If response is "NO", the following Medicare IM given date fields will be blank) Date Medicare IM given:   Date Additional Medicare IM given:    Discharge Disposition:  SKILLED NURSING FACILITY  Per UR Regulation:  Reviewed for med. necessity/level of care/duration of stay  If discussed at Long Length of Stay Meetings, dates discussed:    Comments:  06/13/12 Haru Anspaugh RN,BSN NCM [phone] D/C SNF TODAY.TC [facility] INFORMED THAT PATIENT GOING TO [facility].  06/08/12 Tennile Styles RN,BSN NCM [phone] PT-SNF.

## 2012-06-08 NOTE — Progress Notes (Signed)
Vancomycin & Aztreonam Consult: (please see pharmacy note 9/10 for more details)  83 yom on D#5 Vancomycin and Aztreonam for bilateral lower extremity cellulitis. Patient presented with ARF which has resolved with hydration and antibiotic doses were adjusted 9/10.  This AM, Scr jumped up again to 1.8, CrCl (CG and normalized) ~31 ml/min.  Afebrile, WBC wnl.  Urine culture negative and blood cultures still pending.    Plan:  Change Vancomycin to 750 mg IV q24h. Ok to keep Aztreonam 1gm q8h as ordered.  -Magnesium 1.2 this AM - recommend Mag Sulfate 2gm IV x 1 -Anemia panel with low iron, low sat ratios - consider iron supplementation.   Pharmacy will follow up.  Geoffry Paradise, PharmD, BCPS Pager: [phone] 8:55 AM Pharmacy #: [phone]

## 2012-06-09 DIAGNOSIS — I10 Essential (primary) hypertension: Secondary | ICD-10-CM

## 2012-06-09 LAB — BASIC METABOLIC PANEL
CO2: 19 mEq/L (ref 19–32)
Calcium: 8.2 mg/dL — ABNORMAL LOW (ref 8.4–10.5)
Chloride: 110 mEq/L (ref 96–112)
Creatinine, Ser: 3.13 mg/dL — ABNORMAL HIGH (ref 0.50–1.35)
GFR calc Af Amer: 20 mL/min — ABNORMAL LOW (ref >90)
Sodium: 141 mEq/L (ref 135–145)

## 2012-06-09 MED ORDER — SODIUM CHLORIDE 0.9 % IV SOLN
INTRAVENOUS | Status: DC
  Administered 2012-06-09: 12:00:00 via INTRAVENOUS

## 2012-06-09 MED ORDER — ENOXAPARIN SODIUM 30 MG/0.3ML ~~LOC~~ SOLN
30.0000 mg | SUBCUTANEOUS | Status: DC
  Administered 2012-06-09 – 2012-06-12 (×4): 30 mg via SUBCUTANEOUS
  Filled 2012-06-09 (×4): qty 0.3

## 2012-06-09 MED ORDER — ALBUTEROL SULFATE (5 MG/ML) 0.5% IN NEBU
2.5000 mg | INHALATION_SOLUTION | Freq: Four times a day (QID) | RESPIRATORY_TRACT | Status: DC
  Administered 2012-06-09 – 2012-06-10 (×4): 2.5 mg via RESPIRATORY_TRACT
  Filled 2012-06-09 (×4): qty 0.5

## 2012-06-09 NOTE — Progress Notes (Signed)
[facility] PROGRESS NOTE  [name] ZOX:[id number] DOB: 02-Mar-1928 DOA: 06/04/2012 PCP: [name], MD  Assessment/Plan: Principal Problem:  *Bilateral cellulitis of lower leg Active Problems:  Fever  ARF (acute renal failure)  Anemia  Tachycardia  Leukocytosis  Physical deconditioning    Bilateral lower extremity cellulitis -Started on broad-spectrum antibiotics (vancomycin and aztreonam) on admission; now after 5 days of treatment infection is better. Started on doxycyclin -Denies any fever or chills today. -will transition to PO abx's (plan is to complete a total of 10 days). Day 5/10  Acute renal failure -Likely secondary to dehydration and volume depletion. -patient stop eating about 2 days ago due to nausea and abdominal discomfort; making him to be mild dehydrated again; Cr going up again. Vancomycin probably helping to worse his renal function. -Will restart IVf's -vancomycin discontinue yesterday. -Follow BMET in am  Abdominal pain -CT scan no demonstrating any acute abnormality. -Will continue low dose reglan to help with nausea -Continue po protonix and follow symptoms. --antibiotics changed to PO and so far tolerating them well.  Leukocytosis -Secondary to his lower extremity cellulitis, this is resolved after antibiotics instituted and IVF's given. Has remains WNL.Marland Kitchen   Anemia -Hemoglobin trending down since June of 2013. -Hemoglobin now is 9.6; will monitor. No signs of acute bleeding. -Anemia panel showed anemia of chronic disease with low iron, will continue hemocyte.  Acute delirium -resolved. -attributed to sundowning -Patient now AAOX3 and appropriate; continue monitoring  Deconditioning -SNF for rehab at discharge.   Code Status: Full Family Communication:  Disposition Plan: PT/OT recommended SNF, I will consult the CSW for placement   Brief narrative: 78 year old Caucasian male with past medical history of hypertension and  rheumatoid arthritis came in to the hospital with bilateral lower extremity cellulitis and acute renal failure.  Consultants:  None  Procedures:  None  Antibiotics:  Vancomycin started at 06/04/2012 - 06/08/12.  Aztreonam started at 06/04/2012- 06/08/12  Doxycycline 06/09/12  HPI/Subjective: Feels better, denies fever or chills. Reports decrease nausea and increase PO intake today. Still weak.  Objective: Filed Vitals:   06/09/12 1118 06/09/12 1449 06/09/12 1458 06/09/12 1730  BP: 143/82 111/71  124/75  Pulse: 96 111  104  Temp: 98.1 F (36.7 C) 97.9 F (36.6 C)  97.4 F (36.3 C)  TempSrc: Oral Oral  Oral  Resp: 18 22  18   Height:      Weight:      SpO2: 98% 96% 95% 92%    Intake/Output Summary (Last 24 hours) at 06/09/12 1842 Last data filed at 06/09/12 1830  Gross per 24 hour  Intake   1050 ml  Output      0 ml  Net   1050 ml   Filed Weights   06/04/12 2201  Weight: 79.833 kg (176 lb)    Exam: General: Alert and awake, oriented x3, not in any acute distress. HEENT: anicteric sclera, pupils reactive to light and accommodation, EOMI CVS: S1-S2 clear, no murmur rubs or gallops Chest: clear to auscultation bilaterally, no wheezing, rales or rhonchi Abdomen: soft nontender, nondistended, normal bowel sounds, no organomegaly Extremities: There is redness, swelling and tenderness involving both lower extremities. Edema 1-2 ++ bilaterally Neuro: Cranial nerves II-XII intact, no focal neurological deficits  Data Reviewed: Basic Metabolic Panel:  Lab 06/09/12 8119 06/08/12 0355 06/07/12 0427 06/06/12 0954 06/05/12 0523  NA 141 140 142 141 138  K 4.1 4.1 3.8 4.1 4.1  CL 110 108 109 110 106  CO2 19  20 22 21 20   GLUCOSE 101* 88 92 100* 93  BUN 35* 26* 23 36* 68*  CREATININE 3.13* 1.85* 0.96 1.15 2.50*  CALCIUM 8.2* 8.3* 8.1* 8.1* 8.0*  MG -- 1.2* -- -- --  PHOS -- -- -- -- --   Liver Function Tests:  Lab 06/04/12 2040  AST 30  ALT 23  ALKPHOS 114    BILITOT 0.4  PROT 6.3  ALBUMIN 2.3*   CBC:  Lab 06/08/12 0355 06/05/12 0523 06/04/12 2040  WBC 8.0 9.5 12.5*  NEUTROABS -- -- 10.3*  HGB 9.6* 9.1* 9.7*  HCT 29.4* 27.4* 29.6*  MCV 87.8 86.7 87.1  PLT 297 296 328   BNP (last 3 results)  Basename 06/05/12 0524 03/06/12 1505  PROBNP 506.6* 173.7     Recent Results (from the past 240 hour(s))  CULTURE, BLOOD (ROUTINE X 2)     Status: Normal (Preliminary result)   Collection Time   06/04/12  8:40 PM      Component Value Range Status Comment   Specimen Description BLOOD LEFT HAND  5 ML IN [facility] BOTTLE   Final    Special Requests NONE   Final    Culture  Setup Time 06/05/2012 17:10   Final    Culture     Final    Value:        BLOOD CULTURE RECEIVED NO GROWTH TO DATE CULTURE WILL BE HELD FOR 5 DAYS BEFORE ISSUING A FINAL NEGATIVE REPORT   Report Status PENDING   Incomplete   URINE CULTURE     Status: Normal   Collection Time   06/04/12  8:47 PM      Component Value Range Status Comment   Specimen Description URINE, CLEAN CATCH   Final    Special Requests NONE   Final    Culture  Setup Time 06/05/2012 14:03   Final    Colony Count NO GROWTH   Final    Culture NO GROWTH   Final    Report Status 06/06/2012 FINAL   Final   CULTURE, BLOOD (ROUTINE X 2)     Status: Normal (Preliminary result)   Collection Time   06/04/12  9:02 PM      Component Value Range Status Comment   Specimen Description BLOOD LEFT ARM  2.5 ML IN AEROBIC ONLY   Final    Special Requests NONE   Final    Culture  Setup Time 06/05/2012 17:10   Final    Culture     Final    Value:        BLOOD CULTURE RECEIVED NO GROWTH TO DATE CULTURE WILL BE HELD FOR 5 DAYS BEFORE ISSUING A FINAL NEGATIVE REPORT   Report Status PENDING   Incomplete      Studies: No results found.  Scheduled Meds:    . albuterol  2.5 mg Nebulization Q6H  . aztreonam  1 g Intravenous Q8H  . doxycycline  100 mg Oral Q12H  . enoxaparin (LOVENOX) injection  30 mg Subcutaneous Q24H  .  ferrous fumarate  1 tablet Oral BID  . influenza  inactive virus vaccine  0.5 mL Intramuscular Tomorrow-1000  . metoCLOPramide  5 mg Oral TID AC  . pantoprazole  40 mg Oral Q1200  . sodium chloride  3 mL Intravenous Q12H  . DISCONTD: enoxaparin (LOVENOX) injection  40 mg Subcutaneous Q24H   Continuous Infusions:    . sodium chloride 75 mL/hr at 06/08/12 1424  . sodium chloride 75 mL/hr  at 06/09/12 1224    Time spent: > 30 minutes    Mariavictoria Nottingham  [facility] Pager [phone]. If 8PM-8AM, please contact night-coverage at [email or web address], password [facility] 06/09/2012, 6:42 PM  LOS: 5 days

## 2012-06-09 NOTE — Progress Notes (Signed)
CSW spoke with patients daughter to discuss bed offers. Requesting [facility].  Koki Buxton C. Nalini Alcaraz MSW, LCSW [phone]

## 2012-06-10 ENCOUNTER — Inpatient Hospital Stay (HOSPITAL_COMMUNITY): Payer: Medicare Other

## 2012-06-10 DIAGNOSIS — J984 Other disorders of lung: Secondary | ICD-10-CM

## 2012-06-10 LAB — URINALYSIS, ROUTINE W REFLEX MICROSCOPIC
Bilirubin Urine: NEGATIVE
Glucose, UA: NEGATIVE mg/dL
Ketones, ur: NEGATIVE mg/dL
Nitrite: NEGATIVE
Protein, ur: 100 mg/dL — AB
Specific Gravity, Urine: 1.016 (ref 1.005–1.030)
Urobilinogen, UA: 0.2 mg/dL (ref 0.0–1.0)
pH: 5 (ref 5.0–8.0)

## 2012-06-10 LAB — URINE MICROSCOPIC-ADD ON

## 2012-06-10 LAB — BASIC METABOLIC PANEL
CO2: 17 mEq/L — ABNORMAL LOW (ref 19–32)
Calcium: 8.2 mg/dL — ABNORMAL LOW (ref 8.4–10.5)
GFR calc non Af Amer: 13 mL/min — ABNORMAL LOW (ref >90)
Potassium: 4.1 mEq/L (ref 3.5–5.1)
Sodium: 138 mEq/L (ref 135–145)

## 2012-06-10 MED ORDER — FUROSEMIDE 10 MG/ML IJ SOLN
20.0000 mg | Freq: Once | INTRAMUSCULAR | Status: AC
  Administered 2012-06-10: 20 mg via INTRAVENOUS
  Filled 2012-06-10: qty 2

## 2012-06-10 MED ORDER — ALBUTEROL SULFATE (5 MG/ML) 0.5% IN NEBU
2.5000 mg | INHALATION_SOLUTION | RESPIRATORY_TRACT | Status: DC
  Administered 2012-06-10 – 2012-06-13 (×16): 2.5 mg via RESPIRATORY_TRACT
  Filled 2012-06-10 (×17): qty 0.5

## 2012-06-10 NOTE — Progress Notes (Signed)
Noted that patient is wheezing ,breathing tx was given by RT , urine is turning reddish pink, MD made aware.

## 2012-06-10 NOTE — Progress Notes (Signed)
[facility] PROGRESS NOTE  [name] DGL:[id number] DOB: 1947-02-13 DOA: 06/04/2012 PCP: [name], MD  Assessment/Plan: Principal Problem:  *Bilateral cellulitis of lower leg Active Problems:  Fever  ARF (acute renal failure)  Anemia  Tachycardia  Leukocytosis  Physical deconditioning    Bilateral lower extremity cellulitis -Started on broad-spectrum antibiotics (vancomycin and aztreonam) on admission; now after 5 days of treatment infection is better. Started on doxycyclin -Denies any fever or chills today. -will transition to PO abx's (plan is to complete a total of 10 days). Day 6/10  Acute renal failure -Likely secondary to dehydration and volume depletion. -patient stop eating about 2 days ago due to nausea and abdominal discomfort; making him to be mild dehydrated again; Cr continue worsening. Vancomycin probably helping to worse his renal function. -Will restart IVf's -vancomycin discontinue yesterday. -Follow BMET in am  Abdominal pain -CT scan no demonstrating any acute abnormality. -Will continue low dose reglan to help with nausea -Continue po protonix and follow symptoms. --antibiotics changed to PO and so far tolerating them well.  Leukocytosis -Secondary to his lower extremity cellulitis, this is resolved after antibiotics instituted and IVF's given. Has remains WNL. Will follow CBC in am to monitor..   Anemia -Hemoglobin trending down since June of 2013. -Hemoglobin now is 9.6; will repeat CBC in am. No signs of acute bleeding. -Anemia panel showed anemia of chronic disease with low iron, will continue hemocyte.  Acute delirium -resolved. -attributed to sundowning -Patient now AAOX3 and appropriate; continue monitoring  Deconditioning -SNF for rehab at discharge.  Mild hematuria -most likely secondary to foley placement. Will monitor. -Other concerns especially with elevated WBC's in urine and his worsening renal function is UTI. -cx  ordered and pending  Urinary retention -Foley placed. -Renal US w/o hydronephrosis.  SOB -Most likely due to his underlying lung disease along with extra fluids. -Continue nebs tx and continue IS -Lasix iv X 1 dose; using supplemental oxygen  Code Status: Full Family Communication:  Disposition Plan: PT/OT recommended SNF, I will consult the CSW for placement   Brief narrative: 78 year old Caucasian male with past medical history of hypertension and rheumatoid arthritis came in to the hospital with bilateral lower extremity cellulitis and acute renal failure.  Consultants:  None  Procedures:  None  Antibiotics:  Vancomycin started at 06/04/2012 - 06/08/12.  Aztreonam started at 06/04/2012- 06/08/12  Doxycycline 06/09/12  HPI/Subjective: Complaining of SOB and with wheezing. No fever. Denies CP. Patient also with urinary retention requiring foley placement. After foley placed, mild hematuria seen.  Objective: Filed Vitals:   06/10/12 0818 06/10/12 1351 06/10/12 1607 06/10/12 1710  BP:   134/50   Pulse:   63   Temp:   98.2 F (36.8 C)   TempSrc:   Oral   Resp:   18   Height:      Weight:      SpO2: 96% 96% 96% 98%    Intake/Output Summary (Last 24 hours) at 06/10/12 1912 Last data filed at 06/10/12 1737  Gross per 24 hour  Intake 1271.25 ml  Output   1450 ml  Net -178.75 ml   Filed Weights   06/04/12 2201  Weight: 79.833 kg (176 lb)    Exam: General: Alert and awake, oriented x3, not in any acute distress. HEENT: anicteric sclera, pupils reactive to light and accommodation, EOMI CVS: S1-S2 clear, no murmur rubs or gallops Chest: clear to auscultation bilaterally, no wheezing, rales or rhonchi Abdomen: soft nontender, nondistended, normal  bowel sounds, no organomegaly Extremities: There is redness, swelling and tenderness involving both lower extremities. Edema 1-2 ++ bilaterally Neuro: Cranial nerves II-XII intact, no focal neurological  deficits  Data Reviewed: Basic Metabolic Panel:  Lab 06/10/12 4540 06/09/12 0426 06/08/12 0355 06/07/12 0427 06/06/12 0954  NA 138 141 140 142 141  K 4.1 4.1 4.1 3.8 4.1  CL 108 110 108 109 110  CO2 17* 19 20 22 21   GLUCOSE 93 101* 88 92 100*  BUN 41* 35* 26* 23 36*  CREATININE 4.04* 3.13* 1.85* 0.96 1.15  CALCIUM 8.2* 8.2* 8.3* 8.1* 8.1*  MG -- -- 1.2* -- --  PHOS -- -- -- -- --   Liver Function Tests:  Lab 06/04/12 2040  AST 30  ALT 23  ALKPHOS 114  BILITOT 0.4  PROT 6.3  ALBUMIN 2.3*   CBC:  Lab 06/08/12 0355 06/05/12 0523 06/04/12 2040  WBC 8.0 9.5 12.5*  NEUTROABS -- -- 10.3*  HGB 9.6* 9.1* 9.7*  HCT 29.4* 27.4* 29.6*  MCV 87.8 86.7 87.1  PLT 297 296 328   BNP (last 3 results)  Basename 06/05/12 0524 03/06/12 1505  PROBNP 506.6* 173.7     Recent Results (from the past 240 hour(s))  CULTURE, BLOOD (ROUTINE X 2)     Status: Normal (Preliminary result)   Collection Time   06/04/12  8:40 PM      Component Value Range Status Comment   Specimen Description BLOOD LEFT HAND  5 ML IN [facility] BOTTLE   Final    Special Requests NONE   Final    Culture  Setup Time 06/05/2012 17:10   Final    Culture     Final    Value:        BLOOD CULTURE RECEIVED NO GROWTH TO DATE CULTURE WILL BE HELD FOR 5 DAYS BEFORE ISSUING A FINAL NEGATIVE REPORT   Report Status PENDING   Incomplete   URINE CULTURE     Status: Normal   Collection Time   06/04/12  8:47 PM      Component Value Range Status Comment   Specimen Description URINE, CLEAN CATCH   Final    Special Requests NONE   Final    Culture  Setup Time 06/05/2012 14:03   Final    Colony Count NO GROWTH   Final    Culture NO GROWTH   Final    Report Status 06/06/2012 FINAL   Final   CULTURE, BLOOD (ROUTINE X 2)     Status: Normal (Preliminary result)   Collection Time   06/04/12  9:02 PM      Component Value Range Status Comment   Specimen Description BLOOD LEFT ARM  2.5 ML IN AEROBIC ONLY   Final    Special Requests NONE    Final    Culture  Setup Time 06/05/2012 17:10   Final    Culture     Final    Value:        BLOOD CULTURE RECEIVED NO GROWTH TO DATE CULTURE WILL BE HELD FOR 5 DAYS BEFORE ISSUING A FINAL NEGATIVE REPORT   Report Status PENDING   Incomplete      Studies: US Renal  06/10/2012  *RADIOLOGY REPORT*  Clinical Data:  Acute renal failure.  Evaluate for hydronephrosis.  RENAL/URINARY TRACT ULTRASOUND COMPLETE  Comparison:  06/13/2010 ultrasound.  CT 06/07/2012.  Findings:  Right Kidney:  Normal in size and parenchymal echogenicity.  No evidence of mass or hydronephrosis.  Left  Kidney:  Normal in size and parenchymal echogenicity.  No evidence of mass or hydronephrosis.  Bladder:  Decompressed by Foley catheter.  Incidental finding:  Small right pleural effusion.  IMPRESSION: No evidence for hydronephrosis.  Similar appearance to priors.   Original Report Authenticated By: Elsie Stain, M.D.     Scheduled Meds:    . albuterol  2.5 mg Nebulization Q4H  . doxycycline  100 mg Oral Q12H  . enoxaparin (LOVENOX) injection  30 mg Subcutaneous Q24H  . ferrous fumarate  1 tablet Oral BID  . furosemide  20 mg Intravenous Once  . metoCLOPramide  5 mg Oral TID AC  . pantoprazole  40 mg Oral Q1200  . sodium chloride  3 mL Intravenous Q12H  . DISCONTD: albuterol  2.5 mg Nebulization Q6H   Continuous Infusions:    . sodium chloride 75 mL/hr at 06/09/12 1224    Time spent: > 30 minutes    Aila Terra  [facility] Pager [phone]. If 8PM-8AM, please contact night-coverage at [email or web address], password [facility] 06/10/2012, 7:12 PM  LOS: 6 days

## 2012-06-10 NOTE — Progress Notes (Signed)
Occupational Therapy Treatment Patient Details Name: [name] MRN: [id number] DOB: [birth year] Today's Date: 06/10/2012 Time: 4098-1191 OT Time Calculation (min): 14 min  OT Assessment / Plan / Recommendation Comments on Treatment Session Pt states he is feeling better than he did this am:  had difficulty with urination and was in pain.  Has a little pain in legs at time of tx.  Pt wanted to remain up in chair as lunch had not arrived    Follow Up Recommendations       Barriers to Discharge       Equipment Recommendations  Defer to next venue    Recommendations for Other Services    Frequency Min 2X/week   Plan Discharge plan remains appropriate    Precautions / Restrictions Precautions Precautions: Fall Restrictions Weight Bearing Restrictions: No   Pertinent Vitals/Pain A little pain reported in bil LEs.  Repositioned    ADL  Grooming: Performed;Teeth care;Minimal assistance (assist to reach for items/apply toothpaste) Where Assessed - Grooming: Supported standing Toilet Transfer: Simulated;Moderate assistance (chair to sink to chair) Toilet Transfer Method: Stand pivot Transfers/Ambulation Related to ADLs: SPT only. ADL Comments: Pt with shaky hands standing at the sink    OT Diagnosis:    OT Problem List:   OT Treatment Interventions:     OT Goals Acute Rehab OT Goals Time For Goal Achievement: 06/20/12 ADL Goals Pt Will Perform Grooming: with supervision;Standing at sink ADL Goal: Grooming - Progress: Progressing toward goals Pt Will Transfer to Toilet: with min assist;Ambulation;3-in-1 ADL Goal: Toilet Transfer - Progress: Progressing toward goals  Visit Information  Last OT Received On: 06/10/12 Assistance Needed: +1    Subjective Data      Prior Functioning       Cognition  Overall Cognitive Status: Appears within functional limits for tasks assessed/performed Arousal/Alertness: Awake/alert Orientation Level: Appears intact for tasks  assessed Behavior During Session: [facility] for tasks performed    Mobility  Shoulder Instructions Bed Mobility Bed Mobility: Supine to Sit Supine to Sit: 3: Mod assist;HOB elevated;With rails Details for Bed Mobility Assistance: assist for L LE and trunk Transfers Sit to Stand: 3: Mod assist;From bed;With upper extremity assist;From elevated surface Stand to Sit: 4: Min assist;To chair/3-in-1;With upper extremity assist Details for Transfer Assistance:min cues for posture standing at sink.  Min cues for hand placement with sit to stand     Exercises     Balance Static Standing Balance Static Standing - Balance Support: Right upper extremity supported Static Standing - Level of Assistance: 5: Stand by assistance Static Standing - Comment/# of Minutes: 3 minutes standing at sink   End of Session OT - End of Session Activity Tolerance: Patient limited by fatigue;Other (comment) Patient left: in chair;with call bell/phone within reach;with chair alarm set  GO     [name] 06/10/2012, 2:21 PM Marica Otter, OTR/L [phone] 06/10/2012

## 2012-06-10 NOTE — Progress Notes (Signed)
Physical Therapy Treatment Patient Details Name: [name] MRN: [id number] DOB: 12/06/1927 Today's Date: 06/10/2012 Time: 4098-1191 PT Time Calculation (min): 23 min  PT Assessment / Plan / Recommendation Comments on Treatment Session  Pt assisted to recliner by taking a few steps and performed exercises.  Pt with 2/4 dyspnea upon sitting in recliner (maintained on 3L oxygen) so given short rest break with verbal cues for breathing and then began exercises.    Follow Up Recommendations  Skilled nursing facility    Barriers to Discharge        Equipment Recommendations  Defer to next venue    Recommendations for Other Services    Frequency     Plan Discharge plan remains appropriate;Frequency remains appropriate    Precautions / Restrictions Precautions Precautions: Fall Restrictions Weight Bearing Restrictions: No   Pertinent Vitals/Pain No pain    Mobility  Bed Mobility Bed Mobility: Supine to Sit Supine to Sit: 3: Mod assist;HOB elevated;With rails Details for Bed Mobility Assistance: assist for L LE and trunk Transfers Transfers: Sit to Stand;Stand to Sit;Stand Pivot Transfers Sit to Stand: 3: Mod assist;From bed;With upper extremity assist;From elevated surface Stand to Sit: 4: Min assist;To chair/3-in-1;With upper extremity assist Stand Pivot Transfers: 4: Min assist Details for Transfer Assistance: increased verbal cues for safe technique, once pt standing he reported just to recliner and declined ambulation due to fatigue, pt able to take 5-6 with transfer with RW and maintained on 2L oxygen, pt reported 2/4 dyspnea upon sitting in recliner and cued to breath in through nose Ambulation/Gait Ambulation/Gait Assistance: Other (comment) (pt declined today due to fatigue)    Exercises General Exercises - Lower Extremity Ankle Circles/Pumps: AROM;Both;20 reps Long Arc Quad: AROM;AAROM;Strengthening;Both;15 reps;Seated;Limitations Long [employer] Limitations: L LE  required AAROM for full range Hip ABduction/ADduction: AROM;AAROM;Strengthening;Both;15 reps;Limitations Hip Abduction/Adduction Limitations: L LE required AAROM for full range, also kept elevated from recliner due to bandages/cellulitis Hip Flexion/Marching: AROM;Strengthening;Both;15 reps;Seated   PT Diagnosis:    PT Problem List:   PT Treatment Interventions:     PT Goals Acute Rehab PT Goals PT Goal: Supine/Side to Sit - Progress: Progressing toward goal PT Goal: Sit to Stand - Progress: Progressing toward goal PT Goal: Stand to Sit - Progress: Progressing toward goal PT Goal: Ambulate - Progress: Progressing toward goal  Visit Information  Last PT Received On: 06/10/12 Assistance Needed: +1    Subjective Data  Subjective: Oh yes, I love to try to get up.   Cognition  Overall Cognitive Status: Appears within functional limits for tasks assessed/performed    Balance     End of Session PT - End of Session Equipment Utilized During Treatment: Gait belt;Oxygen Activity Tolerance: Patient limited by fatigue;Other (comment) (dyspnea) Patient left: in chair;with call bell/phone within reach Nurse Communication: Mobility status;Other (comment) (+2, aware pt up in chair)   GP     [name],[name] 06/10/2012, 1:01 PM Pager: [phone]

## 2012-06-11 LAB — CULTURE, BLOOD (ROUTINE X 2): Culture: NO GROWTH

## 2012-06-11 LAB — BASIC METABOLIC PANEL WITH GFR
BUN: 41 mg/dL — ABNORMAL HIGH (ref 6–23)
CO2: 21 meq/L (ref 19–32)
Calcium: 8.7 mg/dL (ref 8.4–10.5)
Chloride: 109 meq/L (ref 96–112)
Creatinine, Ser: 3.1 mg/dL — ABNORMAL HIGH (ref 0.50–1.35)
GFR calc Af Amer: 20 mL/min — ABNORMAL LOW
GFR calc non Af Amer: 17 mL/min — ABNORMAL LOW
Glucose, Bld: 96 mg/dL (ref 70–99)
Potassium: 4.2 meq/L (ref 3.5–5.1)
Sodium: 141 meq/L (ref 135–145)

## 2012-06-11 LAB — CBC
HCT: 25.9 % — ABNORMAL LOW (ref 39.0–52.0)
Hemoglobin: 8.4 g/dL — ABNORMAL LOW (ref 13.0–17.0)
MCH: 28.4 pg (ref 26.0–34.0)
MCHC: 32.4 g/dL (ref 30.0–36.0)
MCV: 87.5 fL (ref 78.0–100.0)
Platelets: 255 K/uL (ref 150–400)
RBC: 2.96 MIL/uL — ABNORMAL LOW (ref 4.22–5.81)
RDW: 15.8 % — ABNORMAL HIGH (ref 11.5–15.5)
WBC: 7 K/uL (ref 4.0–10.5)

## 2012-06-11 MED ORDER — FUROSEMIDE 20 MG PO TABS
20.0000 mg | ORAL_TABLET | Freq: Every day | ORAL | Status: DC
  Administered 2012-06-11 – 2012-06-13 (×3): 20 mg via ORAL
  Filled 2012-06-11 (×3): qty 1

## 2012-06-11 MED ORDER — SODIUM CHLORIDE 0.9 % IJ SOLN
3.0000 mL | INTRAMUSCULAR | Status: DC | PRN
  Administered 2012-06-11: 3 mL via INTRAVENOUS

## 2012-06-11 NOTE — Progress Notes (Signed)
Pt had approx 12 bt run of Vtach.  Nurse was in pt's room at that moment; pt was repositioning himself in the bed.  Denies chest pain or discomfort.  T 97.3  P 64  R 20  BP 135/45 O2 Sats 95% on 2L/min [place].  No s/s acute distress.  Daphane Shepherd PA made aware of event; no new orders received.  Will continue to monitor.

## 2012-06-11 NOTE — Progress Notes (Signed)
[facility] PROGRESS NOTE  Jon CARAVANTES ZOX:[id number] DOB: 03/27/1928 DOA: 06/04/2012 PCP: [name], MD  Assessment/Plan: Principal Problem:  *Bilateral cellulitis of lower leg Active Problems:  Fever  ARF (acute renal failure)  Anemia  Tachycardia  Leukocytosis  Physical deconditioning    Bilateral lower extremity cellulitis -Started on broad-spectrum antibiotics (vancomycin and aztreonam) on admission; now after 5 days of treatment infection is better. Started on doxycyclin -Denies any fever or chills today. -will transition to PO abx's (plan is to complete a total of 10 days). Day 7/10  Acute renal failure -Likely secondary to dehydration and volume depletion. -patient stop eating about 2 days ago due to nausea and abdominal discomfort; making him to be mild dehydrated again; Cr continue worsening. Vancomycin probably helping to worse his renal function. -Will restart IVf's -vancomycin discontinue yesterday. -Follow BMET in am  Abdominal pain -CT scan no demonstrating any acute abnormality. -Will continue low dose reglan to help with nausea -Continue po protonix and follow symptoms. --antibiotics changed to PO and so far tolerating them well.  Leukocytosis -Secondary to his lower extremity cellulitis, this is resolved after antibiotics instituted and IVF's given. Has remains WNL.   Anemia -Hemoglobin trending down since June of 2013. -Hemoglobin now is 8.6; will repeat CBC in am. No transfusion required at this point. -Anemia panel showed anemia of chronic disease with low iron, will continue hemocyte.  Acute delirium -resolved. -attributed to sundowning -Patient now AAOX3 and appropriate; continue monitoring  Deconditioning -SNF for rehab at discharge.  Mild hematuria -most likely secondary to foley placement. Will monitor. -Other concerns especially with elevated WBC's in urine and his worsening renal function is UTI. -cx ordered and pending -No  further blood appreciated on patient's Foley bag.  Urinary retention -Foley placed. -Renal US w/o hydronephrosis.  SOB -Most likely due to his underlying lung disease along with extra fluids. -Continue nebs tx and continue IS -Lasix 20mg  daily PO restarted. -Start tapering oxygen as tolerated.  Code Status: Full Family Communication:  Disposition Plan: PT/OT recommended SNF, I will consult the CSW for placement   Brief narrative: 78 year old Caucasian male with past medical history of hypertension and rheumatoid arthritis came in to the hospital with bilateral lower extremity cellulitis and acute renal failure.  Consultants:  None  Procedures:  None  Antibiotics:  Vancomycin started at 06/04/2012 - 06/08/12.  Aztreonam started at 06/04/2012- 06/08/12  Doxycycline 06/09/12  HPI/Subjective: Reports improvement of his breathing, currently no wheezing, increased appetite. Patient afebrile. Denies chest pain. No further uterine appreciated on patient's Foley.   Objective: Filed Vitals:   06/11/12 0615 06/11/12 0933 06/11/12 1231 06/11/12 1435  BP: 128/61   111/56  Pulse: 71   70  Temp: 98.2 F (36.8 C)   98.2 F (36.8 C)  TempSrc: Oral   Oral  Resp: 20   20  Height:      Weight:      SpO2: 96% 92% 96% 90%    Intake/Output Summary (Last 24 hours) at 06/11/12 1626 Last data filed at 06/11/12 1436  Gross per 24 hour  Intake 1754.08 ml  Output   4060 ml  Net -2305.92 ml   Filed Weights   06/04/12 2201  Weight: 79.833 kg (176 lb)    Exam: General: Alert and awake, oriented x3, not in any acute distress. HEENT: anicteric sclera, pupils reactive to light and accommodation, EOMI CVS: S1-S2 clear, no murmur rubs or gallops Chest: No wheezing, scattered rhonchi. mild bibasal rales. Abdomen:  soft nontender, nondistended, normal bowel sounds, no organomegaly Extremities: There is decrease and improvement on his LE 's redness, swelling and tenderness. Edema 1+  bilaterally Neuro: Cranial nerves II-XII intact, no focal neurological deficits  Data Reviewed: Basic Metabolic Panel:  Lab 06/11/12 1610 06/10/12 0410 06/09/12 0426 06/08/12 0355 06/07/12 0427  NA 141 138 141 140 142  K 4.2 4.1 4.1 4.1 3.8  CL 109 108 110 108 109  CO2 21 17* 19 20 22   GLUCOSE 96 93 101* 88 92  BUN 41* 41* 35* 26* 23  CREATININE 3.10* 4.04* 3.13* 1.85* 0.96  CALCIUM 8.7 8.2* 8.2* 8.3* 8.1*  MG -- -- -- 1.2* --  PHOS -- -- -- -- --   Liver Function Tests:  Lab 06/04/12 2040  AST 30  ALT 23  ALKPHOS 114  BILITOT 0.4  PROT 6.3  ALBUMIN 2.3*   CBC:  Lab 06/11/12 0456 06/08/12 0355 06/05/12 0523 06/04/12 2040  WBC 7.0 8.0 9.5 12.5*  NEUTROABS -- -- -- 10.3*  HGB 8.4* 9.6* 9.1* 9.7*  HCT 25.9* 29.4* 27.4* 29.6*  MCV 87.5 87.8 86.7 87.1  PLT 255 297 296 328   BNP (last 3 results)  Basename 06/05/12 0524 03/06/12 1505  PROBNP 506.6* 173.7     Recent Results (from the past 240 hour(s))  CULTURE, BLOOD (ROUTINE X 2)     Status: Normal   Collection Time   06/04/12  8:40 PM      Component Value Range Status Comment   Specimen Description BLOOD LEFT HAND  5 ML IN [facility] BOTTLE   Final    Special Requests NONE   Final    Culture  Setup Time 06/05/2012 17:10   Final    Culture NO GROWTH 5 DAYS   Final    Report Status 06/11/2012 FINAL   Final   URINE CULTURE     Status: Normal   Collection Time   06/04/12  8:47 PM      Component Value Range Status Comment   Specimen Description URINE, CLEAN CATCH   Final    Special Requests NONE   Final    Culture  Setup Time 06/05/2012 14:03   Final    Colony Count NO GROWTH   Final    Culture NO GROWTH   Final    Report Status 06/06/2012 FINAL   Final   CULTURE, BLOOD (ROUTINE X 2)     Status: Normal   Collection Time   06/04/12  9:02 PM      Component Value Range Status Comment   Specimen Description BLOOD LEFT ARM  2.5 ML IN AEROBIC ONLY   Final    Special Requests NONE   Final    Culture  Setup Time 06/05/2012  17:10   Final    Culture NO GROWTH 5 DAYS   Final    Report Status 06/11/2012 FINAL   Final      Studies: US Renal  06/10/2012  *RADIOLOGY REPORT*  Clinical Data:  Acute renal failure.  Evaluate for hydronephrosis.  RENAL/URINARY TRACT ULTRASOUND COMPLETE  Comparison:  06/13/2010 ultrasound.  CT 06/07/2012.  Findings:  Right Kidney:  Normal in size and parenchymal echogenicity.  No evidence of mass or hydronephrosis.  Left Kidney:  Normal in size and parenchymal echogenicity.  No evidence of mass or hydronephrosis.  Bladder:  Decompressed by Foley catheter.  Incidental finding:  Small right pleural effusion.  IMPRESSION: No evidence for hydronephrosis.  Similar appearance to priors.   Original  Report Authenticated By: Elsie Stain, M.D.     Scheduled Meds:    . albuterol  2.5 mg Nebulization Q4H  . doxycycline  100 mg Oral Q12H  . enoxaparin (LOVENOX) injection  30 mg Subcutaneous Q24H  . ferrous fumarate  1 tablet Oral BID  . furosemide  20 mg Intravenous Once  . furosemide  20 mg Oral Daily  . metoCLOPramide  5 mg Oral TID AC  . pantoprazole  40 mg Oral Q1200  . sodium chloride  3 mL Intravenous Q12H  . DISCONTD: albuterol  2.5 mg Nebulization Q6H   Continuous Infusions:    . sodium chloride 20 mL/hr (06/10/12 1959)    Time spent: > 30 minutes    Takenya Travaglini  [facility] Pager [phone]. If 8PM-8AM, please contact night-coverage at [email or web address], password [facility] 06/11/2012, 4:26 PM  LOS: 7 days

## 2012-06-12 LAB — BASIC METABOLIC PANEL
BUN: 31 mg/dL — ABNORMAL HIGH (ref 6–23)
CO2: 25 mEq/L (ref 19–32)
Chloride: 106 mEq/L (ref 96–112)
GFR calc Af Amer: 35 mL/min — ABNORMAL LOW (ref >90)
Potassium: 3.9 mEq/L (ref 3.5–5.1)

## 2012-06-12 LAB — URINE CULTURE

## 2012-06-12 MED ORDER — ENOXAPARIN SODIUM 40 MG/0.4ML ~~LOC~~ SOLN
40.0000 mg | SUBCUTANEOUS | Status: DC
  Administered 2012-06-13: 40 mg via SUBCUTANEOUS
  Filled 2012-06-12: qty 0.4

## 2012-06-12 NOTE — Progress Notes (Signed)
[facility] PROGRESS NOTE  [name] ZOX:[id number] DOB: April 29, 1928 DOA: 06/04/2012 PCP: [name], MD  Assessment/Plan: Principal Problem:  *Bilateral cellulitis of lower leg Active Problems:  Fever  ARF (acute renal failure)  Anemia  Tachycardia  Leukocytosis  Physical deconditioning    Bilateral lower extremity cellulitis -Started on broad-spectrum antibiotics (vancomycin and aztreonam) on admission; now after 5 days of treatment infection is better. Started on doxycyclin -Denies any fever or chills today. -will transition to PO abx's (plan is to complete a total of 10 days). Day 8/10 -cellulitis continue improving.  Acute renal failure -Likely secondary to dehydration and volume depletion. -Vancomycin probably helping to worse his renal function. -after providing fluids again and discontinue vancomycin; patient Cr has now improved. Currently 1.99 -Follow BMET in am  Abdominal pain -CT scan no demonstrating any acute abnormality. -Will continue low dose reglan to help with nausea -Continue po protonix and follow symptoms. --antibiotics changed to PO and so far tolerating them well.  Leukocytosis -Secondary to his lower extremity cellulitis, this is resolved after antibiotics instituted and IVF's given. Has remains WNL.   Anemia -Hemoglobin trending down since June of 2013. -Hemoglobin now is 8.4 and stable; No transfusion required at this point. -Anemia panel showed anemia of chronic disease with low iron, will continue hemocyte.  Acute delirium -resolved. -attributed to sundowning -Patient now AAOX3 and appropriate; continue monitoring  Deconditioning -SNF for rehab at discharge.  Mild hematuria -most likely secondary to foley placement. Will monitor. -urine cx no growth -No further blood appreciated on patient's Foley bag; making traumatic hematuria the most likely explanation.  Urinary retention -Foley placed. -Renal US w/o  hydronephrosis. -will start voiding trials  SOB -Most likely due to his underlying lung disease along with extra fluids. -Continue nebs tx and continue IS -continue titrating oxygen to RA as tolerated. -continue lasix.   Code Status: Full Family Communication:  Disposition Plan: PT/OT recommended SNF, I will consult the CSW for placement   Brief narrative: 78 year old Caucasian male with past medical history of hypertension and rheumatoid arthritis came in to the hospital with bilateral lower extremity cellulitis and acute renal failure.  Consultants:  None  Procedures:  None  Antibiotics:  Vancomycin started at 06/04/2012 - 06/08/12.  Aztreonam started at 06/04/2012- 06/08/12  Doxycycline 06/09/12  HPI/Subjective: Feeling better; no fever, no abdominal pain, no nausea, no vomiting and no hematuria. BP soft but stable. Renal function continue improving; good urine output.   Objective: Filed Vitals:   06/12/12 1226 06/12/12 1348 06/12/12 1647 06/12/12 2006  BP:  104/58    Pulse:  60    Temp:  98.3 F (36.8 C)    TempSrc:  Oral    Resp:  18    Height:      Weight:      SpO2: 96% 93% 93% 95%    Intake/Output Summary (Last 24 hours) at 06/12/12 2122 Last data filed at 06/12/12 1251  Gross per 24 hour  Intake 1145.67 ml  Output   2980 ml  Net -1834.33 ml   Filed Weights   06/04/12 2201  Weight: 79.833 kg (176 lb)    Exam: General: Alert and awake, oriented x3, not in any acute distress. HEENT: anicteric sclera, pupils reactive to light and accommodation, EOMI CVS: S1-S2 clear, no murmur rubs or gallops Chest: No wheezing, scattered rhonchi. mild bibasal rales. Abdomen: soft nontender, nondistended, normal bowel sounds, no organomegaly Extremities: There is decrease and improvement on his LE 's redness,  swelling and tenderness. Edema 1+ bilaterally Neuro: Cranial nerves II-XII intact, no focal neurological deficits  Data Reviewed: Basic Metabolic  Panel:  Lab 06/12/12 0432 06/11/12 0456 06/10/12 0410 06/09/12 0426 06/08/12 0355  NA 140 141 138 141 140  K 3.9 4.2 4.1 4.1 4.1  CL 106 109 108 110 108  CO2 25 21 17* 19 20  GLUCOSE 93 96 93 101* 88  BUN 31* 41* 41* 35* 26*  CREATININE 1.99* 3.10* 4.04* 3.13* 1.85*  CALCIUM 8.6 8.7 8.2* 8.2* 8.3*  MG 1.1* -- -- -- 1.2*  PHOS -- -- -- -- --   CBC:  Lab 06/11/12 0456 06/08/12 0355  WBC 7.0 8.0  NEUTROABS -- --  HGB 8.4* 9.6*  HCT 25.9* 29.4*  MCV 87.5 87.8  PLT 255 297   BNP (last 3 results)  Basename 06/05/12 0524 03/06/12 1505  PROBNP 506.6* 173.7     Recent Results (from the past 240 hour(s))  CULTURE, BLOOD (ROUTINE X 2)     Status: Normal   Collection Time   06/04/12  8:40 PM      Component Value Range Status Comment   Specimen Description BLOOD LEFT HAND  5 ML IN [facility] BOTTLE   Final    Special Requests NONE   Final    Culture  Setup Time 06/05/2012 17:10   Final    Culture NO GROWTH 5 DAYS   Final    Report Status 06/11/2012 FINAL   Final   URINE CULTURE     Status: Normal   Collection Time   06/04/12  8:47 PM      Component Value Range Status Comment   Specimen Description URINE, CLEAN CATCH   Final    Special Requests NONE   Final    Culture  Setup Time 06/05/2012 14:03   Final    Colony Count NO GROWTH   Final    Culture NO GROWTH   Final    Report Status 06/06/2012 FINAL   Final   CULTURE, BLOOD (ROUTINE X 2)     Status: Normal   Collection Time   06/04/12  9:02 PM      Component Value Range Status Comment   Specimen Description BLOOD LEFT ARM  2.5 ML IN AEROBIC ONLY   Final    Special Requests NONE   Final    Culture  Setup Time 06/05/2012 17:10   Final    Culture NO GROWTH 5 DAYS   Final    Report Status 06/11/2012 FINAL   Final   URINE CULTURE     Status: Normal   Collection Time   06/10/12 12:55 PM      Component Value Range Status Comment   Specimen Description URINE, CLEAN CATCH   Final    Special Requests doxycycline   Final    Culture  Setup  Time 06/11/2012 01:23   Final    Colony Count NO GROWTH   Final    Culture NO GROWTH   Final    Report Status 06/12/2012 FINAL   Final      Studies: No results found.  Scheduled Meds:    . albuterol  2.5 mg Nebulization Q4H  . doxycycline  100 mg Oral Q12H  . enoxaparin (LOVENOX) injection  40 mg Subcutaneous Q24H  . ferrous fumarate  1 tablet Oral BID  . furosemide  20 mg Oral Daily  . metoCLOPramide  5 mg Oral TID AC  . pantoprazole  40 mg Oral Q1200  .  sodium chloride  3 mL Intravenous Q12H  . DISCONTD: enoxaparin (LOVENOX) injection  30 mg Subcutaneous Q24H   Continuous Infusions:    . sodium chloride 20 mL/hr (06/10/12 1959)    Time spent: > 30 minutes    Tiffany Calmes  [facility] Pager [phone]. If 8PM-8AM, please contact night-coverage at [email or web address], password [facility] 06/12/2012, 9:22 PM  LOS: 8 days

## 2012-06-13 LAB — BASIC METABOLIC PANEL
BUN: 24 mg/dL — ABNORMAL HIGH (ref 6–23)
Chloride: 103 mEq/L (ref 96–112)
GFR calc Af Amer: 49 mL/min — ABNORMAL LOW (ref >90)
Potassium: 4.2 mEq/L (ref 3.5–5.1)

## 2012-06-13 MED ORDER — TAMSULOSIN HCL 0.4 MG PO CAPS: 0.4000 mg | ORAL_CAPSULE | Freq: Every day | ORAL | Status: DC

## 2012-06-13 MED ORDER — ALBUTEROL SULFATE (5 MG/ML) 0.5% IN NEBU
2.5000 mg | INHALATION_SOLUTION | Freq: Four times a day (QID) | RESPIRATORY_TRACT | Status: DC
  Administered 2012-06-13: 2.5 mg via RESPIRATORY_TRACT
  Filled 2012-06-13: qty 0.5

## 2012-06-13 MED ORDER — ENSURE COMPLETE PO LIQD: 237.0000 mL | Freq: Two times a day (BID) | ORAL | Status: DC

## 2012-06-13 MED ORDER — PREDNISONE 5 MG PO TABS: 5.0000 mg | ORAL_TABLET | Freq: Every day | ORAL | Status: DC

## 2012-06-13 MED ORDER — TAMSULOSIN HCL 0.4 MG PO CAPS
0.4000 mg | ORAL_CAPSULE | Freq: Every day | ORAL | Status: DC
  Filled 2012-06-13: qty 1

## 2012-06-13 MED ORDER — DOXYCYCLINE HYCLATE 100 MG PO TABS: 100.0000 mg | ORAL_TABLET | Freq: Two times a day (BID) | ORAL | Status: AC

## 2012-06-13 MED ORDER — METOCLOPRAMIDE HCL 5 MG PO TABS: 5.0000 mg | ORAL_TABLET | Freq: Three times a day (TID) | ORAL | Status: DC

## 2012-06-13 MED ORDER — TRAMADOL HCL 50 MG PO TABS: 50.0000 mg | ORAL_TABLET | Freq: Four times a day (QID) | ORAL | Status: DC | PRN

## 2012-06-13 MED ORDER — LEFLUNOMIDE 20 MG PO TABS: 10.0000 mg | ORAL_TABLET | Freq: Every day | ORAL | Status: DC

## 2012-06-13 MED ORDER — FUROSEMIDE 40 MG PO TABS: 20.0000 mg | ORAL_TABLET | Freq: Every day | ORAL | Status: DC

## 2012-06-13 MED ORDER — FERROUS FUMARATE 325 (106 FE) MG PO TABS: 1.0000 | ORAL_TABLET | Freq: Two times a day (BID) | ORAL | Status: DC

## 2012-06-13 NOTE — Progress Notes (Signed)
06/13/12---No void since foley d/c'd per MD order at 23:40 on 06/12/12--Bladder scanned for >200cc at 05:30am, Pt up to [facility] with max assist to attempt to void, but was unable to urinate. I/O cath done to retrieve 400cc clear yellow urine.

## 2012-06-13 NOTE — Discharge Summary (Signed)
Physician Discharge Summary  [name]:[id number] DOB: 02-Mar-1928 DOA: 06/04/2012  PCP: [name], MD  Admit date: 06/04/2012 Discharge date: 06/13/2012  Recommendations for Outpatient Follow-up:  1- PCP follow up in 2 weeks (follow renal function with a BMET; reevaluate BP and restart antihypertensive drugs if appropriate; Follow CBC for anemia; continue providing treatment of chronic conditions)  Discharge Diagnoses:  Principal Problem:  *Bilateral cellulitis of lower leg Active Problems:  Fever  ARF (acute renal failure)  Anemia  Tachycardia  Leukocytosis  Physical deconditioning   Discharge Condition: Stable and improved; still with significant weakness and fatigue with minimal exertion; will discharge to SNF for physical rehab.  Diet recommendation: heart healthy diet  Filed Weights   06/04/12 2201  Weight: 79.833 kg (176 lb)    History of present illness:  [name] is a 78 y.o. male who presented to the ED with complaints of worsening pain redness and swelling in both legs especially the left lower leg over the past week. He developed fevers and chills over the past 24 hours. He denies having any chest pain of SOB.    Hospital Course:  Bilateral lower extremity cellulitis  -Started on broad-spectrum antibiotics (vancomycin and aztreonam) on admission; after 5 days of IV treatment infection was significant improved and his antibiotics were change to PO.   -Denies any fever or chills today.  -will continue PO doxycycline to complete antibiotic regimen. Day 9/10  -cellulitis continue improving.   Acute renal failure  -Likely secondary to dehydration and volume depletion.  -Vancomycin probably helping to worse his renal function.  -after providing fluids again and discontinue vancomycin; patient Cr has now improved. Currently 1.48  -Follow BMET in 1 week   Abdominal pain  -CT scan no demonstrating any acute abnormality.  -Will continue low dose reglan  to help with chronic nausea  -Continue BID PPI. -Patient tolerating full diet and currently without any further complaints.   Leukocytosis  -Secondary to his lower extremity cellulitis, this is resolved after antibiotics instituted and IVF's given. Has remains WNL.   Anemia  -Hemoglobin trending down since June of 2013.  -Hemoglobin now is 8.4 and stable; No transfusion required at this point.  -Anemia panel showed anemia of chronic disease with low iron, will continue hemocyte.   Acute delirium  -resolved.  -attributed to sundowning  -Patient now AAOX3 and appropriate; continue monitoring   Deconditioning  -SNF for rehab at discharge.   Mild hematuria  -most likely secondary to first foley placement.   -urine cx without growth  -No further blood appreciated on patient's Foley bag; making traumatic hematuria the most likely explanation.   Urinary retention  -Renal US w/o hydronephrosis.  -patient failed voiding trials and after discussing with urology, the plan is for replaced foley and to start flomax daily. -Patient will follow with urology in 4 days as an outpatient for follow up.  SOB  -Most likely due to his underlying lung disease along with extra fluids.  -Continue PRN albuterol and IS  -Resume prednisone -continue daily lasix.  RA -Continue ARAVA and prednisone.  BPH -started on flomax  HTN -Continue lasix -Diovan stop due to acute renal failure -BP well controlled with current regimen -Advised to follow a low sodium heart healthy diet.   Consultations:  Urology curbside for urinary retention  Discharge Exam: Filed Vitals:   06/12/12 2006 06/12/12 2159 06/13/12 0615 06/13/12 0811  BP:  130/58 133/64   Pulse:  80 62   Temp:  98 F (36.7 C) 98.6 F (37 C)   TempSrc:  Oral Oral   Resp:  20 16   Height:      Weight:      SpO2: 95% 94% 96% 95%    General: NAD; feeling weak and fatigue Cardiovascular: RRR, no rubs or gallops Respiratory:  minimally wheezing, no rales or crackles Abdomen: soft, NT, ND, positive BS Extremities: pretty much complete resolution of patient LE erythema and swelling from his cellulitis; no cyanosis or clubbing; trace to +1 edema bilaterally. Neuro: non focal deficit   Discharge Instructions  Discharge Orders    Future Orders Please Complete By Expires   Diet - low sodium heart healthy      Discharge instructions      Comments:   -Keep patient hydrated -Take medications as prescribed -Arrange follow up over the next 4-5 days to be seen by [facility] for voiding trials and urinary retention -Low sodium diet       Medication List     As of 06/13/2012 11:58 AM    STOP taking these medications         valsartan 160 MG tablet   Commonly known as: DIOVAN      TAKE these medications         albuterol (2.5 MG/3ML) 0.083% nebulizer solution   Commonly known as: PROVENTIL   Take 2.5 mg by nebulization every 4 (four) hours as needed. For shortness of breath      alendronate 70 MG tablet   Commonly known as: FOSAMAX   Take 70 mg by mouth every 7 (seven) days. Take with a full glass of water on an empty stomach.  On Friday      aspirin EC 81 MG tablet   Take 81 mg by mouth daily.      doxycycline 100 MG tablet   Commonly known as: VIBRA-TABS   Take 1 tablet (100 mg total) by mouth every 12 (twelve) hours.      ferrous fumarate 325 (106 FE) MG Tabs   Commonly known as: HEMOCYTE - 106 mg FE   Take 1 tablet (106 mg of iron total) by mouth 2 (two) times daily.      flunisolide 29 MCG/ACT (0.025%) nasal spray   Commonly known as: NASAREL   Place 2 sprays into the nose 2 (two) times daily. Dose is for each nostril.      furosemide 40 MG tablet   Commonly known as: LASIX   Take 0.5 tablets (20 mg total) by mouth daily.      gabapentin 100 MG capsule   Commonly known as: NEURONTIN   Take 200 mg by mouth 2 (two) times daily.      HALLS COUGH DROPS 9.1 MG Lozg   Generic drug:  Menthol   Use as directed 1 lozenge in the mouth or throat as needed. For cough or sore throught      leflunomide 20 MG tablet   Commonly known as: ARAVA   Take 0.5 tablets (10 mg total) by mouth daily.      loperamide 2 MG capsule   Commonly known as: IMODIUM   Take 2 mg by mouth 4 (four) times daily as needed. For diarrhea      metoCLOPramide 5 MG tablet   Commonly known as: REGLAN   Take 1 tablet (5 mg total) by mouth 3 (three) times daily before meals.      omeprazole 20 MG capsule   Commonly known as: PRILOSEC  Take 20 mg by mouth 2 (two) times daily.      predniSONE 5 MG tablet   Commonly known as: DELTASONE   Take 1 tablet (5 mg total) by mouth daily. Take with food      silver sulfADIAZINE 1 % cream   Commonly known as: SILVADENE   Apply 1 application topically daily. To legs and feet      SUPER B COMPLEX PO   Take 1 tablet by mouth daily.      Tamsulosin HCl 0.4 MG Caps   Commonly known as: FLOMAX   Take 1 capsule (0.4 mg total) by mouth daily after supper.      traMADol 50 MG tablet   Commonly known as: ULTRAM   Take 1 tablet (50 mg total) by mouth every 6 (six) hours as needed. For pain      Vitamin D 2000 UNITS tablet   Take 2,000 Units by mouth daily.           Follow-up Information    Follow up with PANG,RICHARD, MD. Schedule an appointment as soon as possible for a visit in 2 weeks.   Contact information:   [address] [place], [address] [place] [address] [phone]           The results of significant diagnostics from this hospitalization (including imaging, microbiology, ancillary and laboratory) are listed below for reference.    Significant Diagnostic Studies: Ct Abdomen Pelvis W Contrast  06/07/2012  *RADIOLOGY REPORT*  Clinical Data: Lower abdominal pain.  CT ABDOMEN AND PELVIS WITH CONTRAST  Technique:  Multidetector CT imaging of the abdomen and pelvis was performed following the standard protocol during bolus administration  of intravenous contrast.  Contrast: OMNIPAQUE IOHEXOL 300 MG/ML  SOLN  Comparison: Abdominal CT 04/26/2009 and chest CT 10/12/2011  Findings: Again noted are small scattered pulmonary nodules in the lower lungs.  Etiology of these nodules is unknown.  There is volume loss at the posterior left lung base. There is a small amount of pleural fluid.  No evidence for free air.  Study is limited due to excessive patient motion.  There may be prominent vessels or calcifications  involving the gallbladder wall.  There is no significant biliary dilatation.  No gross abnormality to the liver or spleen.  Fluid within the urinary bladder and there may be a few bladder diverticula.  No significant free fluid.  Fullness of the renal collecting systems bilaterally and this may be related to urinary bladder distention.  Evidence for small renal cortical cysts.  There is no significant contrast within the kidneys on the delayed images.  The patient has a ventral hernia that extends towards the right side.  There are bowel structures that extend to the ventral hernia.  No evidence for bowel obstruction.  A small calcification near the left ureterovesical junction but this is probably not causing obstruction and unclear if it is actually within the bladder or collecting system.  Patient has had previous back surgery.  There is enlargement of a calcified extruded disc along the right side of this central canal at L1 and L2.  This appears to be causing significant narrowing of the central canal.  This disc has enlarged since the MRI in 2011. There are marked degenerative changes in the right hip. Anterolisthesis at L5-S1.  IMPRESSION:  Ventral hernia containing bowel but no evidence for obstruction.  Fullness in the renal collecting system bilaterally and fullness of the urinary bladder.  Findings may represent bladder  outlet obstruction.  Cannot exclude a small stone near the left ureterovesical junction as described.  The study  is limited due to excessive patient motion.  Again noted are small pulmonary nodules of unknown etiology.  Enlargement of a calcified protruding disc at L1-L2 which is causing significant narrowing of the central spinal canal.   Original Report Authenticated By: Richarda Overlie, M.D.    US Renal  06/10/2012  *RADIOLOGY REPORT*  Clinical Data:  Acute renal failure.  Evaluate for hydronephrosis.  RENAL/URINARY TRACT ULTRASOUND COMPLETE  Comparison:  06/13/2010 ultrasound.  CT 06/07/2012.  Findings:  Right Kidney:  Normal in size and parenchymal echogenicity.  No evidence of mass or hydronephrosis.  Left Kidney:  Normal in size and parenchymal echogenicity.  No evidence of mass or hydronephrosis.  Bladder:  Decompressed by Foley catheter.  Incidental finding:  Small right pleural effusion.  IMPRESSION: No evidence for hydronephrosis.  Similar appearance to priors.   Original Report Authenticated By: Elsie Stain, M.D.    Dg Chest Port 1 View  06/06/2012  *RADIOLOGY REPORT*  Clinical Data: Respiratory difficulty, wheezing.  PORTABLE CHEST - 1 VIEW  Comparison: 06/04/2012  Findings: Heart mildly enlarged. Bibasilar densities, right greater than left, likely atelectasis.  Suspect small effusions. Degenerative changes in the shoulders.  IMPRESSION: Borderline heart size.  Bibasilar atelectasis.  Probable small effusions.   Original Report Authenticated By: Cyndie Chime, M.D.    Dg Chest Port 1 View  06/04/2012  *RADIOLOGY REPORT*  Clinical Data: 78 year old male fever bilateral lower extremity redness.  Hypertension.  PORTABLE CHEST - 1 VIEW  Comparison: 04/30/2012 and earlier.  Findings: Semi upright AP portable view at 2020 hours.  Stable lung volumes.  Stable cardiac size and mediastinal contours.  Visualized tracheal air column is within normal limits.  No pneumothorax or edema.  No pleural effusion or acute pulmonary opacity identified. Stable elevation right hemidiaphragm.  IMPRESSION: No acute cardiopulmonary  abnormality.   Original Report Authenticated By: Harley Hallmark, M.D.     Microbiology: Recent Results (from the past 240 hour(s))  CULTURE, BLOOD (ROUTINE X 2)     Status: Normal   Collection Time   06/04/12  8:40 PM      Component Value Range Status Comment   Specimen Description BLOOD LEFT HAND  5 ML IN [facility] BOTTLE   Final    Special Requests NONE   Final    Culture  Setup Time 06/05/2012 17:10   Final    Culture NO GROWTH 5 DAYS   Final    Report Status 06/11/2012 FINAL   Final   URINE CULTURE     Status: Normal   Collection Time   06/04/12  8:47 PM      Component Value Range Status Comment   Specimen Description URINE, CLEAN CATCH   Final    Special Requests NONE   Final    Culture  Setup Time 06/05/2012 14:03   Final    Colony Count NO GROWTH   Final    Culture NO GROWTH   Final    Report Status 06/06/2012 FINAL   Final   CULTURE, BLOOD (ROUTINE X 2)     Status: Normal   Collection Time   06/04/12  9:02 PM      Component Value Range Status Comment   Specimen Description BLOOD LEFT ARM  2.5 ML IN AEROBIC ONLY   Final    Special Requests NONE   Final    Culture  Setup Time  06/05/2012 17:10   Final    Culture NO GROWTH 5 DAYS   Final    Report Status 06/11/2012 FINAL   Final   URINE CULTURE     Status: Normal   Collection Time   06/10/12 12:55 PM      Component Value Range Status Comment   Specimen Description URINE, CLEAN CATCH   Final    Special Requests doxycycline   Final    Culture  Setup Time 06/11/2012 01:23   Final    Colony Count NO GROWTH   Final    Culture NO GROWTH   Final    Report Status 06/12/2012 FINAL   Final      Labs: Basic Metabolic Panel:  Lab 06/13/12 4540 06/12/12 0432 06/11/12 0456 06/10/12 0410 06/09/12 0426 06/08/12 0355  NA 141 140 141 138 141 --  K 4.2 3.9 4.2 4.1 4.1 --  CL 103 106 109 108 110 --  CO2 27 25 21  17* 19 --  GLUCOSE 89 93 96 93 101* --  BUN 24* 31* 41* 41* 35* --  CREATININE 1.48* 1.99* 3.10* 4.04* 3.13* --  CALCIUM 8.1*  8.6 8.7 8.2* 8.2* --  MG -- 1.1* -- -- -- 1.2*  PHOS -- -- -- -- -- --   CBC:  Lab 06/11/12 0456 06/08/12 0355  WBC 7.0 8.0  NEUTROABS -- --  HGB 8.4* 9.6*  HCT 25.9* 29.4*  MCV 87.5 87.8  PLT 255 297   BNP: BNP (last 3 results)  Basename 06/05/12 0524 03/06/12 1505  PROBNP 506.6* 173.7    Time coordinating discharge: > 30 minutes  Signed:  Neleh Muldoon  [facility] 06/13/2012, 11:58 AM

## 2012-06-13 NOTE — Progress Notes (Signed)
Patient cleared for discharge. Packet copied and placed in [place]. ptar called for transportation. Patient aware and daughter agreeable.  Ticara Waner C. Ankita Newcomer MSW, LCSW [phone]

## 2012-06-13 NOTE — Progress Notes (Signed)
INITIAL ADULT NUTRITION ASSESSMENT Date: 06/13/2012   Time: 1:44 PM Reason for Assessment: nutrition risk   ASSESSMENT: Male 78 y.o.  Dx: Bilateral cellulitis of lower leg  Hx:  Past Medical History  Diagnosis Date  . Hypertension   . Arthritis   . Ex-smoker   . Rheumatoid arthritis   . Pulmonary nodule   . DDD (degenerative disc disease)     Related Meds:  Scheduled Meds:   . albuterol  2.5 mg Nebulization Q6H  . doxycycline  100 mg Oral Q12H  . enoxaparin (LOVENOX) injection  40 mg Subcutaneous Q24H  . ferrous fumarate  1 tablet Oral BID  . furosemide  20 mg Oral Daily  . metoCLOPramide  5 mg Oral TID AC  . pantoprazole  40 mg Oral Q1200  . sodium chloride  3 mL Intravenous Q12H  . Tamsulosin HCl  0.4 mg Oral QPC supper  . DISCONTD: albuterol  2.5 mg Nebulization Q4H   Continuous Infusions:   . sodium chloride 20 mL/hr (06/10/12 1959)   PRN Meds:.acetaminophen, albuterol, alum & mag hydroxide-simeth, HYDROmorphone (DILAUDID) injection, ondansetron (ZOFRAN) IV, ondansetron, sodium chloride, zolpidem   Ht: 5\' 9"  (175.3 cm)  Wt: 176 lb (79.833 kg)  Ideal Wt: 73 kg % Ideal Wt: 110% Wt Readings from Last 10 Encounters:  06/04/12 176 lb (79.833 kg)  10/13/11 163 lb 2.3 oz (74 kg)  05/15/09 176 lb 4 oz (79.946 kg)    Body mass index is 25.99 kg/(m^2). (Overweight)  Food/Nutrition Related Hx: Patient reported his appetite fluctuates and it has been poor lately. His PO intake varies at meals between 25-100%. Noted patient with bilateral lower extremity cellulitis and +1 edema.   Labs:  CMP     Component Value Date/Time   NA 141 06/13/2012 0439   K 4.2 06/13/2012 0439   CL 103 06/13/2012 0439   CO2 27 06/13/2012 0439   GLUCOSE 89 06/13/2012 0439   BUN 24* 06/13/2012 0439   CREATININE 1.48* 06/13/2012 0439   CALCIUM 8.1* 06/13/2012 0439   PROT 6.3 06/04/2012 2040   ALBUMIN 2.3* 06/04/2012 2040   AST 30 06/04/2012 2040   ALT 23 06/04/2012 2040   ALKPHOS 114 06/04/2012  2040   BILITOT 0.4 06/04/2012 2040   GFRNONAA 42* 06/13/2012 0439   GFRAA 49* 06/13/2012 0439    Intake/Output Summary (Last 24 hours) at 06/13/12 1347 Last data filed at 06/13/12 1318  Gross per 24 hour  Intake 906.67 ml  Output   1101 ml  Net -194.33 ml     Diet Order: General  Supplements/Tube Feeding: none at this time   IVF:    sodium chloride Last Rate: 20 mL/hr (06/10/12 1959)    Estimated Nutritional Needs:   Kcal: 1600-1830 Protein: 96-120 grams  Fluid: 1 ml per kcal intake   NUTRITION DIAGNOSIS: -Inadequate oral intake (NI-2.1).  Status: Ongoing  RELATED TO: poor appetite  AS EVIDENCE BY: varied PO intake documented 25-100% at meals.   MONITORING/EVALUATION(Goals): PO intake, weights, labs, skin 1. PO intake > 75% at meals and supplements.   EDUCATION NEEDS: -No education needs identified at this time  INTERVENTION: 1. Will order patient chocolate Ensure BID, provides 500 kcal and 18 grams of protein.  2. RD to follow for nutrition plan of care.   Dietitian [phone]  DOCUMENTATION CODES Per approved criteria  -Not Applicable    Iven [facility] 06/13/2012, 1:44 PM

## 2012-06-13 NOTE — Progress Notes (Signed)
Occupational Therapy Treatment Patient Details Name: [name] MRN: [id number] DOB: 1928-04-14 Today's Date: 06/13/2012 Time: 4132-4401 OT Time Calculation (min): 13 min  OT Assessment / Plan / Recommendation Comments on Treatment Session Pt reports no pain, just feeling very weak and fatigued with minimal activity. Agreeable to rehab.    Follow Up Recommendations  Skilled nursing facility    Barriers to Discharge       Equipment Recommendations  3 in 1 bedside comode    Recommendations for Other Services    Frequency Min 2X/week   Plan Discharge plan remains appropriate    Precautions / Restrictions Precautions Precautions: Fall Restrictions Weight Bearing Restrictions: No        ADL  Toilet Transfer: Simulated;Moderate assistance Toilet Transfer Method: Stand pivot ADL Comments: pt is slow to rise to standing and forward flexed in standing. some difficulty with stepping around to chair. States legs "are weak."     OT Diagnosis:    OT Problem List:   OT Treatment Interventions:     OT Goals ADL Goals ADL Goal: Toilet Transfer - Progress: Progressing toward goals  Visit Information  Last OT Received On: 06/13/12 Assistance Needed: +1    Subjective Data  Subjective: I am alright Patient Stated Goal: agreeable to rehab to get stronger   Prior Functioning       Cognition  Overall Cognitive Status: Appears within functional limits for tasks assessed/performed Arousal/Alertness: Awake/alert Orientation Level: Appears intact for tasks assessed Behavior During Session: [facility] for tasks performed    Mobility  Shoulder Instructions Bed Mobility Bed Mobility: Supine to Sit Supine to Sit: 5: Supervision;HOB elevated;With rails Transfers Transfers: Sit to Stand;Stand to Sit Sit to Stand: 3: Mod assist;With upper extremity assist;From bed Stand to Sit: 4: Min assist;To chair/3-in-1;With upper extremity assist Details for Transfer Assistance: verbal cues for  safety with hand placement, posture. tends to forward flex in standing. pt reports feeling fatigued after transfer only.       Exercises      Balance     End of Session OT - End of Session Activity Tolerance: Patient limited by fatigue Patient left: in chair;with call bell/phone within reach;with chair alarm set  GO     Lennox Laity [phone] 06/13/2012, 9:03 AM

## 2012-06-13 NOTE — Plan of Care (Signed)
Problem: Phase III Progression Outcomes Goal: Voiding independently Outcome: Not Applicable Date Met:  06/13/12 Foley Cath placed prior to discharge per MD order

## 2012-06-13 NOTE — Progress Notes (Signed)
Physical Therapy Treatment Patient Details Name: [name] MRN: [id number] DOB: 06-26-1928 Today's Date: 06/13/2012 Time: 0865-7846 PT Time Calculation (min): 21 min  PT Assessment / Plan / Recommendation Comments on Treatment Session  Pt on RA when PT arrived with O2 sats at 89-90%, therefore applied 2LO2 for ambulation with sats at 95% following ambulation.  RN made aware.      Follow Up Recommendations  Skilled nursing facility    Barriers to Discharge        Equipment Recommendations  3 in 1 bedside comode    Recommendations for Other Services    Frequency Min 3X/week   Plan Discharge plan remains appropriate;Frequency remains appropriate    Precautions / Restrictions Precautions Precautions: Fall Restrictions Weight Bearing Restrictions: No   Pertinent Vitals/Pain 5/10, pain in L foot, pt states it doesn't hurt as bad when he is up walking on it.     Mobility  Bed Mobility Bed Mobility: Supine to Sit Supine to Sit: 5: Supervision;HOB elevated;With rails Details for Bed Mobility Assistance: Pt able to get LEs and trunk to EOB.  cues for hand placement and safety.  Transfers Transfers: Sit to Stand;Stand to Sit;Stand Pivot Transfers Sit to Stand: 3: Mod assist;From elevated surface;With upper extremity assist;From bed Stand to Sit: 4: Min assist;To chair/3-in-1;With upper extremity assist Details for Transfer Assistance: Assist to rise and steady with cues for hand placement and safety when sitting/standing.  Ambulation/Gait Ambulation/Gait Assistance: 3: Mod assist Ambulation Distance (Feet): 40 Feet Assistive device: Rolling walker Ambulation/Gait Assistance Details: Cues for maintaining upright posture throughout, sequencing/technique with RW.  Gait Pattern: Step-to pattern;Decreased stride length;Trunk flexed Gait velocity: decreased    Exercises     PT Diagnosis:    PT Problem List:   PT Treatment Interventions:     PT Goals Acute Rehab PT Goals PT  Goal Formulation: With patient Time For Goal Achievement: 06/13/12 Potential to Achieve Goals: Good Pt will go Supine/Side to Sit: with supervision PT Goal: Supine/Side to Sit - Progress: Met Pt will go Sit to Stand: with supervision PT Goal: Sit to Stand - Progress: Progressing toward goal Pt will go Stand to Sit: with supervision PT Goal: Stand to Sit - Progress: Progressing toward goal Pt will Ambulate: 16 - 50 feet;with min assist;with least restrictive assistive device PT Goal: Ambulate - Progress: Progressing toward goal  Visit Information  Last PT Received On: 06/13/12 Assistance Needed: +1    Subjective Data  Subjective: [name] [name]   Cognition  Overall Cognitive Status: Appears within functional limits for tasks assessed/performed Arousal/Alertness: Awake/alert Orientation Level: Appears intact for tasks assessed Behavior During Session: [name] for tasks performed    Balance     End of Session PT - End of Session Equipment Utilized During Treatment: Gait belt;Oxygen Activity Tolerance: Patient limited by fatigue;Other (comment) Patient left: in chair;with call bell/phone within reach Nurse Communication: Mobility status;Other (comment) (O2 sats)   GP     Page, Meribeth Mattes 06/13/2012, 3:47 PM

## 2012-06-13 NOTE — Progress Notes (Signed)
Patient discharge to [facility], report given to Drema Halon p/u by transport personal, no complaint of  any pain or discomfort, upon discharge. PIV removed no s/s of swelling no infiltration noted.

## 2012-07-12 ENCOUNTER — Encounter (HOSPITAL_BASED_OUTPATIENT_CLINIC_OR_DEPARTMENT_OTHER): Payer: Medicare Other | Attending: General Surgery

## 2012-07-12 DIAGNOSIS — Z79899 Other long term (current) drug therapy: Secondary | ICD-10-CM | POA: Insufficient documentation

## 2012-07-12 DIAGNOSIS — L02419 Cutaneous abscess of limb, unspecified: Secondary | ICD-10-CM | POA: Insufficient documentation

## 2012-07-12 DIAGNOSIS — Z96659 Presence of unspecified artificial knee joint: Secondary | ICD-10-CM | POA: Insufficient documentation

## 2012-07-12 DIAGNOSIS — I872 Venous insufficiency (chronic) (peripheral): Secondary | ICD-10-CM | POA: Insufficient documentation

## 2012-07-12 DIAGNOSIS — L97809 Non-pressure chronic ulcer of other part of unspecified lower leg with unspecified severity: Secondary | ICD-10-CM | POA: Insufficient documentation

## 2012-07-12 NOTE — H&P (Signed)
NAME:  Bowers, Jon NO.:  [id number]  MEDICAL RECORD NO.:  [id number]  LOCATION:  FOOT                         FACILITY:  [facility]  PHYSICIAN:  Joanne Gavel, M.D.        DATE OF BIRTH:  08-10-1928  DATE OF ADMISSION:  07/12/2012 DATE OF DISCHARGE:                             HISTORY & PHYSICAL   CHIEF COMPLAINT:  Ulceration on the right lower leg.  HISTORY OF PRESENT ILLNESS:  This is an 78 year old male who fell 6 or 8 months ago and has been gradually going downhill since.  He was admitted to the hospital with bilateral cellulitis with severe swelling.  He also had abdominal pain at that time.  He was treated with intravenous antibiotics and oral antibiotics and was sent to [facility] for rehab.  In the last few days, the redness has returned, and he has developed a small area of drainage in the right posterior leg.  He is essentially wheelchair bound at present.  PAST MEDICAL HISTORY:  Had acute renal failure at the time of his cellulitis, anemia, hypertension, BPH, leukocytosis, tachycardia.  SURGICAL HISTORY:  Knee replacement, back surgery x3 and appendectomy.  Cigarettes, none.  Alcohol, none.  ALLERGIES:  PENICILLIN and CODEINE.  MEDICATIONS:  Albuterol, Fosamax, doxycycline, which has been stopped now.  Iron, furosemide, gabapentin, Arava,  Reglan, Prilosec, Deltasone, various vitamins.  REVIEW OF SYSTEMS:  Essentially as above.  PHYSICAL EXAMINATION:  VITAL SIGNS:  Temperature 97.9, pulse 97, respirations 18, blood pressure 98/63. GENERAL:  The patient is lethargic but arousable.  He has normal symmetrical strength.  He has a ventral hernia by x-ray, but his abdomen is not examined. CHEST:  Clear. HEART:  Regular rhythm. EXTREMITIES:  Reveals peripheral pulses are not palpable.  An ABI done on the right side was 1.30.  Both legs are swollen and red, although his daughter tells me they were read at the time of hospital admission,  but they have been less red previous to this particular exam.  There is a 1.2 x 1.4 very superficial wound, posterior right leg.  Both legs have evidence of chronic venous stasis.  IMPRESSION:  Evidence of chronic venous stasis.  He appears to be having cellulitis at the present, and has a tiny chronic venous ulcer.  PLAN OF TREATMENT:  We will use Keflex empirically for now, ordered vascular studies, put silver alginate on the wound, and wrap both wrap both legs with Kerlix and Coban.  We will see him in 7 days.     Joanne Gavel, M.D.     RA/MEDQ  D:  07/12/2012  T:  07/12/2012  Job:  [id number]

## 2012-08-16 ENCOUNTER — Encounter (HOSPITAL_BASED_OUTPATIENT_CLINIC_OR_DEPARTMENT_OTHER): Payer: Medicare Other

## 2012-10-24 ENCOUNTER — Other Ambulatory Visit: Payer: Self-pay | Admitting: Dermatology

## 2013-01-03 ENCOUNTER — Inpatient Hospital Stay (HOSPITAL_COMMUNITY)
Admission: EM | Admit: 2013-01-03 | Discharge: 2013-01-06 | DRG: 191 | Disposition: A | Discharge status: Skilled Nursing Facility | Payer: Medicare Other | Attending: Internal Medicine | Admitting: Internal Medicine

## 2013-01-03 ENCOUNTER — Encounter (HOSPITAL_COMMUNITY): Payer: Self-pay

## 2013-01-03 ENCOUNTER — Emergency Department (HOSPITAL_COMMUNITY): Payer: Medicare Other

## 2013-01-03 DIAGNOSIS — L02419 Cutaneous abscess of limb, unspecified: Secondary | ICD-10-CM | POA: Diagnosis present

## 2013-01-03 DIAGNOSIS — R0600 Dyspnea, unspecified: Secondary | ICD-10-CM | POA: Diagnosis present

## 2013-01-03 DIAGNOSIS — L03116 Cellulitis of left lower limb: Secondary | ICD-10-CM

## 2013-01-03 DIAGNOSIS — L03115 Cellulitis of right lower limb: Secondary | ICD-10-CM | POA: Diagnosis present

## 2013-01-03 DIAGNOSIS — R5381 Other malaise: Secondary | ICD-10-CM | POA: Diagnosis present

## 2013-01-03 DIAGNOSIS — Z79899 Other long term (current) drug therapy: Secondary | ICD-10-CM

## 2013-01-03 DIAGNOSIS — R300 Dysuria: Secondary | ICD-10-CM | POA: Diagnosis present

## 2013-01-03 DIAGNOSIS — M069 Rheumatoid arthritis, unspecified: Secondary | ICD-10-CM

## 2013-01-03 DIAGNOSIS — Z880 Allergy status to penicillin: Secondary | ICD-10-CM

## 2013-01-03 DIAGNOSIS — J449 Chronic obstructive pulmonary disease, unspecified: Secondary | ICD-10-CM | POA: Diagnosis present

## 2013-01-03 DIAGNOSIS — R0902 Hypoxemia: Secondary | ICD-10-CM | POA: Diagnosis present

## 2013-01-03 DIAGNOSIS — I10 Essential (primary) hypertension: Secondary | ICD-10-CM | POA: Diagnosis present

## 2013-01-03 DIAGNOSIS — R627 Adult failure to thrive: Secondary | ICD-10-CM | POA: Diagnosis present

## 2013-01-03 DIAGNOSIS — N179 Acute kidney failure, unspecified: Secondary | ICD-10-CM

## 2013-01-03 DIAGNOSIS — Z96659 Presence of unspecified artificial knee joint: Secondary | ICD-10-CM

## 2013-01-03 DIAGNOSIS — E860 Dehydration: Secondary | ICD-10-CM | POA: Diagnosis present

## 2013-01-03 DIAGNOSIS — J9859 Other diseases of mediastinum, not elsewhere classified: Secondary | ICD-10-CM

## 2013-01-03 DIAGNOSIS — D72829 Elevated white blood cell count, unspecified: Secondary | ICD-10-CM

## 2013-01-03 DIAGNOSIS — Z87891 Personal history of nicotine dependence: Secondary | ICD-10-CM

## 2013-01-03 DIAGNOSIS — R0989 Other specified symptoms and signs involving the circulatory and respiratory systems: Secondary | ICD-10-CM

## 2013-01-03 DIAGNOSIS — R000 Tachycardia, unspecified: Secondary | ICD-10-CM

## 2013-01-03 DIAGNOSIS — IMO0002 Reserved for concepts with insufficient information to code with codable children: Secondary | ICD-10-CM

## 2013-01-03 DIAGNOSIS — Z882 Allergy status to sulfonamides status: Secondary | ICD-10-CM

## 2013-01-03 DIAGNOSIS — D649 Anemia, unspecified: Secondary | ICD-10-CM | POA: Diagnosis present

## 2013-01-03 DIAGNOSIS — N183 Chronic kidney disease, stage 3 unspecified: Secondary | ICD-10-CM

## 2013-01-03 DIAGNOSIS — B965 Pseudomonas (aeruginosa) (mallei) (pseudomallei) as the cause of diseases classified elsewhere: Secondary | ICD-10-CM | POA: Diagnosis present

## 2013-01-03 DIAGNOSIS — J984 Other disorders of lung: Secondary | ICD-10-CM

## 2013-01-03 DIAGNOSIS — Z888 Allergy status to other drugs, medicaments and biological substances status: Secondary | ICD-10-CM

## 2013-01-03 DIAGNOSIS — I129 Hypertensive chronic kidney disease with stage 1 through stage 4 chronic kidney disease, or unspecified chronic kidney disease: Secondary | ICD-10-CM | POA: Diagnosis present

## 2013-01-03 DIAGNOSIS — J441 Chronic obstructive pulmonary disease with (acute) exacerbation: Principal | ICD-10-CM

## 2013-01-03 DIAGNOSIS — N390 Urinary tract infection, site not specified: Secondary | ICD-10-CM | POA: Diagnosis present

## 2013-01-03 DIAGNOSIS — R509 Fever, unspecified: Secondary | ICD-10-CM

## 2013-01-03 DIAGNOSIS — Z8249 Family history of ischemic heart disease and other diseases of the circulatory system: Secondary | ICD-10-CM

## 2013-01-03 DIAGNOSIS — E876 Hypokalemia: Secondary | ICD-10-CM | POA: Diagnosis present

## 2013-01-03 HISTORY — DX: Chronic kidney disease, unspecified: N18.9

## 2013-01-03 HISTORY — DX: Adverse effect of unspecified anesthetic, initial encounter: T41.45XA

## 2013-01-03 HISTORY — DX: Other complications of anesthesia, initial encounter: T88.59XA

## 2013-01-03 LAB — POCT I-STAT, CHEM 8
BUN: 53 mg/dL — ABNORMAL HIGH (ref 6–23)
Calcium, Ion: 1.05 mmol/L — ABNORMAL LOW (ref 1.13–1.30)
Creatinine, Ser: 2.5 mg/dL — ABNORMAL HIGH (ref 0.50–1.35)
Hemoglobin: 12.2 g/dL — ABNORMAL LOW (ref 13.0–17.0)
TCO2: 40 mmol/L (ref 0–100)

## 2013-01-03 LAB — CBC
HCT: 36.7 % — ABNORMAL LOW (ref 39.0–52.0)
HCT: 34.2 % — ABNORMAL LOW (ref 39.0–52.0)
Hemoglobin: 12.1 g/dL — ABNORMAL LOW (ref 13.0–17.0)
Hemoglobin: 11.3 g/dL — ABNORMAL LOW (ref 13.0–17.0)
MCH: 29.4 pg (ref 26.0–34.0)
MCHC: 33 g/dL (ref 30.0–36.0)
MCV: 88.4 fL (ref 78.0–100.0)
RBC: 4.15 MIL/uL — ABNORMAL LOW (ref 4.22–5.81)
RBC: 3.84 MIL/uL — ABNORMAL LOW (ref 4.22–5.81)
RDW: 16 % — ABNORMAL HIGH (ref 11.5–15.5)
WBC: 9.1 10*3/uL (ref 4.0–10.5)

## 2013-01-03 LAB — COMPREHENSIVE METABOLIC PANEL
ALT: 14 U/L (ref 0–53)
Albumin: 2.9 g/dL — ABNORMAL LOW (ref 3.5–5.2)
Alkaline Phosphatase: 97 U/L (ref 39–117)
BUN: 52 mg/dL — ABNORMAL HIGH (ref 6–23)
Chloride: 94 mEq/L — ABNORMAL LOW (ref 96–112)
Glucose, Bld: 107 mg/dL — ABNORMAL HIGH (ref 70–99)
Potassium: 3.1 mEq/L — ABNORMAL LOW (ref 3.5–5.1)
Sodium: 141 mEq/L (ref 135–145)
Total Bilirubin: 0.3 mg/dL (ref 0.3–1.2)
Total Protein: 6.7 g/dL (ref 6.0–8.3)

## 2013-01-03 LAB — URINE MICROSCOPIC-ADD ON

## 2013-01-03 LAB — URINALYSIS, ROUTINE W REFLEX MICROSCOPIC
Bilirubin Urine: NEGATIVE
Specific Gravity, Urine: 1.014 (ref 1.005–1.030)
Urobilinogen, UA: 0.2 mg/dL (ref 0.0–1.0)
pH: 5.5 (ref 5.0–8.0)

## 2013-01-03 LAB — CREATININE, SERUM: Creatinine, Ser: 2.44 mg/dL — ABNORMAL HIGH (ref 0.50–1.35)

## 2013-01-03 MED ORDER — QUETIAPINE FUMARATE 25 MG PO TABS
25.0000 mg | ORAL_TABLET | Freq: Every day | ORAL | Status: DC
  Administered 2013-01-03 – 2013-01-05 (×3): 25 mg via ORAL
  Filled 2013-01-03 (×4): qty 1

## 2013-01-03 MED ORDER — TAMSULOSIN HCL 0.4 MG PO CAPS
0.4000 mg | ORAL_CAPSULE | Freq: Every day | ORAL | Status: DC
  Administered 2013-01-03 – 2013-01-05 (×3): 0.4 mg via ORAL
  Filled 2013-01-03 (×4): qty 1

## 2013-01-03 MED ORDER — ACETAMINOPHEN 325 MG PO TABS
650.0000 mg | ORAL_TABLET | Freq: Four times a day (QID) | ORAL | Status: DC | PRN
  Administered 2013-01-03 – 2013-01-05 (×2): 650 mg via ORAL
  Filled 2013-01-03 (×2): qty 2

## 2013-01-03 MED ORDER — SODIUM CHLORIDE 0.9 % IV SOLN
INTRAVENOUS | Status: DC
  Administered 2013-01-03 – 2013-01-06 (×5): via INTRAVENOUS

## 2013-01-03 MED ORDER — HEPARIN SODIUM (PORCINE) 5000 UNIT/ML IJ SOLN
5000.0000 [IU] | Freq: Three times a day (TID) | INTRAMUSCULAR | Status: DC
  Administered 2013-01-03 – 2013-01-06 (×9): 5000 [IU] via SUBCUTANEOUS
  Filled 2013-01-03 (×12): qty 1

## 2013-01-03 MED ORDER — IPRATROPIUM BROMIDE 0.02 % IN SOLN
0.5000 mg | Freq: Two times a day (BID) | RESPIRATORY_TRACT | Status: DC
  Administered 2013-01-04: 0.5 mg via RESPIRATORY_TRACT
  Filled 2013-01-03: qty 2.5

## 2013-01-03 MED ORDER — DOXYCYCLINE HYCLATE 100 MG IV SOLR
100.0000 mg | Freq: Two times a day (BID) | INTRAVENOUS | Status: DC
  Administered 2013-01-03 (×2): 100 mg via INTRAVENOUS
  Filled 2013-01-03 (×4): qty 100

## 2013-01-03 MED ORDER — METOPROLOL TARTRATE 25 MG PO TABS
25.0000 mg | ORAL_TABLET | Freq: Two times a day (BID) | ORAL | Status: DC
  Administered 2013-01-03 – 2013-01-06 (×7): 25 mg via ORAL
  Filled 2013-01-03 (×8): qty 1

## 2013-01-03 MED ORDER — TIOTROPIUM BROMIDE MONOHYDRATE 18 MCG IN CAPS
18.0000 ug | ORAL_CAPSULE | Freq: Every day | RESPIRATORY_TRACT | Status: DC
  Administered 2013-01-03 – 2013-01-06 (×4): 18 ug via RESPIRATORY_TRACT
  Filled 2013-01-03: qty 5

## 2013-01-03 MED ORDER — IPRATROPIUM BROMIDE 0.02 % IN SOLN: 0.5000 mg | Freq: Two times a day (BID) | RESPIRATORY_TRACT | Status: DC

## 2013-01-03 MED ORDER — ZOLPIDEM TARTRATE 5 MG PO TABS
5.0000 mg | ORAL_TABLET | Freq: Every evening | ORAL | Status: DC | PRN
  Administered 2013-01-03: 5 mg via ORAL
  Filled 2013-01-03: qty 1

## 2013-01-03 MED ORDER — ZOLPIDEM TARTRATE 5 MG PO TABS: 10.0000 mg | ORAL_TABLET | Freq: Every evening | ORAL | Status: DC | PRN

## 2013-01-03 MED ORDER — SODIUM CHLORIDE 0.9 % IJ SOLN
3.0000 mL | Freq: Two times a day (BID) | INTRAMUSCULAR | Status: DC
  Administered 2013-01-03 – 2013-01-06 (×5): 3 mL via INTRAVENOUS

## 2013-01-03 MED ORDER — POLYETHYLENE GLYCOL 3350 17 G PO PACK
17.0000 g | PACK | Freq: Every day | ORAL | Status: DC | PRN
  Filled 2013-01-03: qty 1

## 2013-01-03 MED ORDER — POTASSIUM CHLORIDE CRYS ER 20 MEQ PO TBCR
40.0000 meq | EXTENDED_RELEASE_TABLET | Freq: Once | ORAL | Status: AC
  Administered 2013-01-03: 40 meq via ORAL
  Filled 2013-01-03: qty 2

## 2013-01-03 MED ORDER — ALBUTEROL SULFATE (5 MG/ML) 0.5% IN NEBU
2.5000 mg | INHALATION_SOLUTION | Freq: Two times a day (BID) | RESPIRATORY_TRACT | Status: DC
  Administered 2013-01-04 – 2013-01-06 (×5): 2.5 mg via RESPIRATORY_TRACT
  Filled 2013-01-03 (×5): qty 0.5

## 2013-01-03 MED ORDER — ALENDRONATE SODIUM 70 MG PO TABS: 70.0000 mg | ORAL_TABLET | ORAL | Status: DC

## 2013-01-03 MED ORDER — LEFLUNOMIDE 20 MG PO TABS
10.0000 mg | ORAL_TABLET | Freq: Every morning | ORAL | Status: DC
  Administered 2013-01-03 – 2013-01-05 (×3): 10 mg via ORAL
  Filled 2013-01-03 (×5): qty 1

## 2013-01-03 MED ORDER — ONDANSETRON HCL 4 MG/2ML IJ SOLN: 4.0000 mg | Freq: Four times a day (QID) | INTRAMUSCULAR | Status: DC | PRN

## 2013-01-03 MED ORDER — ALBUTEROL SULFATE (5 MG/ML) 0.5% IN NEBU: 2.5000 mg | INHALATION_SOLUTION | RESPIRATORY_TRACT | Status: DC | PRN

## 2013-01-03 MED ORDER — PANTOPRAZOLE SODIUM 40 MG PO TBEC
40.0000 mg | DELAYED_RELEASE_TABLET | Freq: Every day | ORAL | Status: DC
  Administered 2013-01-03 – 2013-01-06 (×4): 40 mg via ORAL
  Filled 2013-01-03 (×4): qty 1

## 2013-01-03 MED ORDER — ACETAMINOPHEN 650 MG RE SUPP: 650.0000 mg | Freq: Four times a day (QID) | RECTAL | Status: DC | PRN

## 2013-01-03 MED ORDER — ASPIRIN EC 81 MG PO TBEC
81.0000 mg | DELAYED_RELEASE_TABLET | Freq: Every morning | ORAL | Status: DC
  Administered 2013-01-03 – 2013-01-06 (×4): 81 mg via ORAL
  Filled 2013-01-03 (×4): qty 1

## 2013-01-03 MED ORDER — LEVOFLOXACIN IN D5W 500 MG/100ML IV SOLN
500.0000 mg | Freq: Once | INTRAVENOUS | Status: AC
  Administered 2013-01-03: 500 mg via INTRAVENOUS
  Filled 2013-01-03: qty 100

## 2013-01-03 MED ORDER — ALBUTEROL SULFATE (5 MG/ML) 0.5% IN NEBU: 2.5000 mg | INHALATION_SOLUTION | Freq: Four times a day (QID) | RESPIRATORY_TRACT | Status: DC | PRN

## 2013-01-03 MED ORDER — METHYLPREDNISOLONE SODIUM SUCC 125 MG IJ SOLR
60.0000 mg | Freq: Two times a day (BID) | INTRAMUSCULAR | Status: DC
  Administered 2013-01-03 – 2013-01-04 (×3): 60 mg via INTRAVENOUS
  Filled 2013-01-03 (×4): qty 0.96

## 2013-01-03 MED ORDER — METHYLPREDNISOLONE SODIUM SUCC 125 MG IJ SOLR
125.0000 mg | Freq: Once | INTRAMUSCULAR | Status: AC
  Administered 2013-01-03: 125 mg via INTRAVENOUS
  Filled 2013-01-03: qty 2

## 2013-01-03 MED ORDER — ALBUTEROL SULFATE (5 MG/ML) 0.5% IN NEBU
2.5000 mg | INHALATION_SOLUTION | Freq: Four times a day (QID) | RESPIRATORY_TRACT | Status: DC
  Administered 2013-01-03 (×2): 2.5 mg via RESPIRATORY_TRACT
  Filled 2013-01-03 (×2): qty 0.5

## 2013-01-03 MED ORDER — ONDANSETRON HCL 4 MG PO TABS: 4.0000 mg | ORAL_TABLET | Freq: Four times a day (QID) | ORAL | Status: DC | PRN

## 2013-01-03 NOTE — Evaluation (Signed)
Physical Therapy Evaluation Patient Details Name: [name] MRN: [id number] DOB: 01-12-1928 Today's Date: 01/03/2013 Time: 4098-1191 PT Time Calculation (min): 32 min  PT Assessment / Plan / Recommendation Clinical Impression  79 y.o. male admitted to [facility] for weakness, leg pain, leg swelling, inability to walk.  Dx with dyspnea/COPD exacerbation, acute renal failure, hypokalemia, and deconditioning.  Also, of note recent h/o UTI and urine still has some positive cultures.  He presents today with decreased leg strength left > right, pain upon sitting and standing at EOB and inability to walk even with RW and two person assist.  O2 sats and HR are stable on 4 L O2 [place].  Pt reports he and his daughter live together, but that his daughter works days.  He is open to persuing SNF for rehab and has been to [facility] for rehab in the past (although did not have much good to say about it and may want to pursue another venue).      PT Assessment  Patient needs continued PT services    Follow Up Recommendations  SNF    Does the patient have the potential to tolerate intense rehabilitation     NA  Barriers to Discharge Decreased caregiver support daughter works days    Equipment Recommendations  None recommended by PT    Recommendations for Other Services   none  Frequency Min 3X/week    Precautions / Restrictions Precautions Precautions: Fall Restrictions Weight Bearing Restrictions: No   Pertinent Vitals/Pain VSS on 4 L O2 [place], DOE 2/4 with mobility (pt reports this is normal for him).  Pain increased with sitting and standing in bil legs.  Only comfortable lying down flat.        Mobility  Bed Mobility Rolling Left: 4: Min assist;With rail Left Sidelying to Sit: 3: Mod assist;With rails;HOB elevated Sitting - Scoot to Edge of Bed: 3: Mod assist;With rail Sit to Sidelying Left: With rail;HOB flat;1: +2 Total assist Sit to Sidelying Left: Patient Percentage: 50% Scooting to HOB:  4: Min assist;With rail (bed in trendelenberg) Details for Bed Mobility Assistance: min assist of legs and trunk to roll and mod assist of trunk to get to sitting.  Mod assist for pt to pull against this therapist to scoot to EOB.  2 Person assist to get both legs and trunk back into bed.   Transfers Transfers: Sit to Stand;Stand to Sit Sit to Stand: 1: +2 Total assist;From elevated surface;With upper extremity assist;From bed Sit to Stand: Patient Percentage: 50% Stand to Sit: 1: +2 Total assist;With upper extremity assist;To elevated surface;To bed Stand to Sit: Patient Percentage: 50% Details for Transfer Assistance: two person assist to support trunk over painful and  buring legs in standing.  Knees blocked and facilitation needed for erect trunk and extended hips.  Pt unable to take steps up in bed or away from bed due to pain and weakness in legs.   Ambulation/Gait Ambulation/Gait Assistance:  (attempted, but unable)        PT Diagnosis: Difficulty walking;Abnormality of gait;Generalized weakness;Acute pain  PT Problem List: Decreased activity tolerance;Decreased strength;Decreased range of motion;Decreased balance;Decreased mobility;Decreased knowledge of use of DME;Cardiopulmonary status limiting activity;Obesity;Pain PT Treatment Interventions: DME instruction;Gait training;Functional mobility training;Therapeutic activities;Therapeutic exercise;Balance training;Neuromuscular re-education;Patient/family education   PT Goals Acute Rehab PT Goals PT Goal Formulation: With patient Time For Goal Achievement: 01/17/13 Potential to Achieve Goals: Good Pt will go Supine/Side to Sit: with modified independence;with HOB 0 degrees PT Goal: Supine/Side to Sit -  Progress: Goal set today Pt will go Sit to Stand: with modified independence PT Goal: Sit to Stand - Progress: Goal set today Pt will go Stand to Sit: with modified independence;with upper extremity assist PT Goal: Stand to Sit -  Progress: Goal set today Pt will Transfer Bed to Chair/Chair to Bed: with modified independence PT Transfer Goal: Bed to Chair/Chair to Bed - Progress: Goal set today Pt will Ambulate: 51 - 150 feet;with modified independence;with rolling walker PT Goal: Ambulate - Progress: Goal set today  Visit Information  Last PT Received On: 01/03/13 Assistance Needed: +2    Subjective Data  Subjective: Pt reports that he has not been able to walk for 2 days Patient Stated Goal: to be independent again, to walk   Prior Functioning  Home Living Lives With: Daughter Available Help at Discharge: Available PRN/intermittently;Family (daughter works days) Type of Home: House Home Access: Ramped entrance Home Layout: One level;Other (Comment) (with basement, but pt doesn't have to go into basement) Bathroom Shower/Tub: Walk-in shower;Door Bathroom Toilet: Standard Home Adaptive Equipment: Bedside commode/3-in-1;Grab bars around toilet;Grab bars in shower;Hand-held shower hose;Walker - rolling;Wheelchair - manual Additional Comments: normally, pt would use RW for gait when needed, but not all of the time.   Prior Function Level of Independence: Needs assistance Needs Assistance: Meal Prep;Light Housekeeping Meal Prep: Total Light Housekeeping: Total Able to Take Stairs?: No Driving: Yes Communication Communication: No difficulties    Cognition  Cognition Overall Cognitive Status: Appears within functional limits for tasks assessed/performed Arousal/Alertness: Awake/alert Orientation Level: Appears intact for tasks assessed Behavior During Session: [facility] for tasks performed Cognition - Other Comments: not specifically tested, but conversation normal     Extremity/Trunk Assessment Right Lower Extremity Assessment RLE ROM/Strength/Tone: Deficits RLE ROM/Strength/Tone Deficits: 5/5 ankle, 4/5 knee, 4/5 hip RLE Sensation: WFL - Light Touch Left Lower Extremity Assessment LLE ROM/Strength/Tone:  Deficits LLE ROM/Strength/Tone Deficits: 5/5 ankle, 3+/5 knee, 3+/5 hip LLE Sensation: WFL - Light Touch;Deficits (burning pain at rest in left leg)      End of Session PT - End of Session Equipment Utilized During Treatment: Gait belt Activity Tolerance: Patient limited by pain Patient left: in bed;with call bell/phone within reach    [place] B. Arlo Butt, PT, DPT [phone]   01/03/2013, 4:50 PM

## 2013-01-03 NOTE — ED Provider Notes (Signed)
History     CSN: [id number]  Arrival date & time 01/03/13  0423   First MD Initiated Contact with Patient 01/03/13 0502      Chief Complaint  Patient presents with  . Shortness of Breath  . Urinary Tract Infection    (Consider location/radiation/quality/duration/timing/severity/associated sxs/prior treatment) HPI History provided by patient and family bedside. Patient mostly concerned about persistent urinary tract infection and frequency of urination. He takes Lasix and reports he has been on and about it is continuously for last 3 months for ongoing UTI. Tonight feeling short of breath with bilateral leg swelling. No history of CHF. No chest pain. No fevers. No cough. States she has not been feeling well for some time. He denies any abdominal pain or dysuria or hematuria. Symptoms moderate in severity. EMS reports room air pulse ox in the 80s improved with oxygen. No trauma. No recent travel. No history of DVT or PE. Past Medical History  Diagnosis Date  . Hypertension   . Arthritis   . Ex-smoker   . Rheumatoid arthritis   . Pulmonary nodule   . DDD (degenerative disc disease)     Past Surgical History  Procedure Laterality Date  . Joint replacement      bilateral knee replacement  . Hernia repair    . Back surgery      X 2  . Appendectomy      Family History  Problem Relation Age of Onset  . Coronary artery disease Brother   . Hypertension Father   . Hypertension Mother     History  Substance Use Topics  . Smoking status: Former Smoker -- 0.50 packs/day for 30 years    Types: Cigarettes    Quit date: 10/12/1973  . Smokeless tobacco: Never Used  . Alcohol Use: No      Review of Systems  Constitutional: Negative for fever and chills.  HENT: Negative for neck pain and neck stiffness.   Eyes: Negative for visual disturbance.  Respiratory: Positive for shortness of breath.   Cardiovascular: Positive for leg swelling. Negative for chest pain.   Gastrointestinal: Negative for abdominal pain.  Genitourinary: Positive for frequency. Negative for dysuria and flank pain.  Musculoskeletal: Negative for back pain.  Skin: Negative for color change.       Has had some ongoing erythema to bilateral lower extremities  Neurological: Negative for headaches.  All other systems reviewed and are negative.    Allergies  Codeine; Hydrocodone; Oxycodone; Penicillins; and Sulfa antibiotics  Home Medications   Current Outpatient Rx  Name  Route  Sig  Dispense  Refill  . albuterol (PROVENTIL) (2.5 MG/3ML) 0.083% nebulizer solution   Nebulization   Take 2.5 mg by nebulization every 4 (four) hours as needed. For shortness of breath         . alendronate (FOSAMAX) 70 MG tablet   Oral   Take 70 mg by mouth every 7 (seven) days. Take with a full glass of water on an empty stomach.  On Friday         . aspirin EC 81 MG tablet   Oral   Take 81 mg by mouth every morning.          . B Complex-C (SUPER B COMPLEX PO)   Oral   Take 1 tablet by mouth every morning.          . Cholecalciferol (VITAMIN D) 2000 UNITS tablet   Oral   Take 2,000 Units by mouth every morning.          Marland Kitchen  Fe Fum-FA-B Cmp-C-Zn-Mg-Mn-Cu (HEMATINIC PLUS VIT/MINERALS) 106-1 MG TABS   Oral   Take 1 tablet by mouth every morning.         . flunisolide (NASAREL) 29 MCG/ACT (0.025%) nasal spray   Nasal   Place 2 sprays into the nose 2 (two) times daily. Dose is for each nostril.         . furosemide (LASIX) 40 MG tablet   Oral   Take 20 mg by mouth 2 (two) times daily.         Marland Kitchen gabapentin (NEURONTIN) 100 MG capsule   Oral   Take 200 mg by mouth 2 (two) times daily.         Marland Kitchen leflunomide (ARAVA) 20 MG tablet   Oral   Take 10 mg by mouth every morning.         . metoprolol (LOPRESSOR) 50 MG tablet   Oral   Take 25 mg by mouth 2 (two) times daily.         Marland Kitchen omeprazole (PRILOSEC) 20 MG capsule   Oral   Take 20 mg by mouth every morning.           . predniSONE (DELTASONE) 5 MG tablet   Oral   Take 5 mg by mouth daily.         . Probiotic Product (ALIGN PO)   Oral   Take 1 tablet by mouth every morning.         Marland Kitchen QUEtiapine (SEROQUEL) 25 MG tablet   Oral   Take 25 mg by mouth at bedtime.         . silver sulfADIAZINE (SILVADENE) 1 % cream   Topical   Apply 1 application topically daily. To legs and feet         . Tamsulosin HCl (FLOMAX) 0.4 MG CAPS   Oral   Take 1 capsule (0.4 mg total) by mouth daily after supper.         . traMADol (ULTRAM) 50 MG tablet   Oral   Take 1 tablet (50 mg total) by mouth every 6 (six) hours as needed. For pain   30 tablet   0   . zolpidem (AMBIEN) 10 MG tablet   Oral   Take 10 mg by mouth at bedtime as needed for sleep.         Marland Kitchen loperamide (IMODIUM) 2 MG capsule   Oral   Take 2 mg by mouth 4 (four) times daily as needed. For diarrhea         . Menthol (HALLS COUGH DROPS) 9.1 MG LOZG   Mouth/Throat   Use as directed 1 lozenge in the mouth or throat as needed. For cough or sore throught           BP 112/58  Pulse 80  Temp(Src) 97.6 F (36.4 C) (Rectal)  Resp 11  SpO2 93%  Physical Exam  Constitutional: He is oriented to person, place, and time. He appears well-developed and well-nourished.  HENT:  Head: Normocephalic and atraumatic.  Eyes: EOM are normal. Pupils are equal, round, and reactive to light.  Neck: Normal range of motion. Neck supple.  Cardiovascular: Normal rate, regular rhythm and intact distal pulses.   Pulmonary/Chest: Effort normal. No stridor. He exhibits no tenderness.  Mildly decreased bilateral breath sounds. No respiratory distress  Abdominal: Soft. Bowel sounds are normal. He exhibits no distension. There is no tenderness.  Musculoskeletal: Normal range of motion.  Bilateral pretibial edema with mild erythema bilat.  Distal neurovascular intact x4  Neurological: He is alert and oriented to person, place, and time.  Skin: Skin  is warm and dry.    ED Course  Procedures (including critical care time)  Labs Reviewed  CBC - Abnormal; Notable for the following:    RBC 4.15 (*)    Hemoglobin 12.1 (*)    HCT 36.7 (*)    RDW 16.0 (*)    All other components within normal limits  COMPREHENSIVE METABOLIC PANEL - Abnormal; Notable for the following:    Potassium 3.1 (*)    Chloride 94 (*)    CO2 35 (*)    Glucose, Bld 107 (*)    BUN 52 (*)    Creatinine, Ser 2.45 (*)    Albumin 2.9 (*)    GFR calc non Af Amer 23 (*)    GFR calc Af Amer 27 (*)    All other components within normal limits  POCT I-STAT, CHEM 8 - Abnormal; Notable for the following:    Potassium 3.2 (*)    BUN 53 (*)    Creatinine, Ser 2.50 (*)    Glucose, Bld 109 (*)    Calcium, Ion 1.05 (*)    Hemoglobin 12.2 (*)    HCT 36.0 (*)    All other components within normal limits  URINALYSIS, ROUTINE W REFLEX MICROSCOPIC  PRO B NATRIURETIC PEPTIDE  POCT I-STAT TROPONIN I   Dg Chest Portable 1 View  01/03/2013  *RADIOLOGY REPORT*  Clinical Data: Shortness of breath.  PORTABLE CHEST - 1 VIEW  Comparison: Chest radiograph performed 06/06/2012  Findings: The lungs are mildly hypoexpanded.  Mild bibasilar airspace opacities may reflect atelectasis or possibly mild pneumonia.  The cardiomediastinal silhouette is borderline normal in size.  The patient is status post median sternotomy.  No acute osseous abnormalities are seen.  IMPRESSION: Lungs mildly hypoexpanded; mild bibasilar airspace opacities may reflect atelectasis or possibly mild pneumonia.   Original Report Authenticated By: Tonia Ghent, M.D.     Date: 01/03/2013  Rate: 82  Rhythm: normal sinus rhythm  QRS Axis: left  Intervals: PR prolonged  ST/T Wave abnormalities: nonspecific ST changes  Conduction Disutrbances:first-degree A-V block   Narrative Interpretation:   Old EKG Reviewed: none available   1. Hypoxia     6:33 AM discussed with Dr. Kerry Hough - plan medical admission. IV  antibiotics. Oxygen for hypoxia. Potassium for her hypokalemia.  MDM  Shortness of breath and hypoxia with new O2 requirement  EKG. Chest x-ray. Labs. Urinalysis.  IV Levaquin. Potassium provided  Medical admission      Sunnie Nielsen, MD 01/03/13 [phone]

## 2013-01-03 NOTE — H&P (Signed)
[facility] History and Physical  [name] EAV:[id number] DOB: 1928-02-03 DOA: 01/03/2013  Referring physician: Dr. Theodoro Kalata PCP: [name], MD  Specialists: none  Chief Complaint: SOB and Generalized weakness  HPI: [name] is a 79 y.o. male  Past medical history of hypertension, ex-smoker history of rheumatoid arthritis that comes in for generalized weakness 2 days prior to admission, shortness of breath bilateral leg swelling. The patient relates he was recently treated for urinary tract infection she has urine cultures grew greater than 100 K. Pseudomonas and was treated with Bactrim just finished treatment 2 days prior to admission. Since then he has been feeling weak with leg swelling. The daughter which is at bedside relate that he has not been able to get up as he is so weak. He relates his weakness is not made worse or better by anything. His lower extremity erythema progressively is getting worse.  Here in the emergency room:  Chest x-ray was done mildly hypo-expansion of the lung with some atelectasis at the bases, was given one dose of Solu-Medrol and started on empiric antibiotics. We are consulted for further evaluation. Also UA showing 21-50 white blood cells and a few bacteria.  Review of Systems: The patient denies anorexia, fever, weight loss,, vision loss, decreased hearing, hoarseness, chest pain, syncope, dyspnea on exertion,  balance deficits, hemoptysis, abdominal pain, melena, hematochezia, severe indigestion/heartburn, hematuria, incontinence, genital sores, muscle weakness, suspicious skin lesions, transient blindness, difficulty walking, depression, unusual weight change, abnormal bleeding, enlarged lymph nodes, angioedema, and breast masses.    Past Medical History  Diagnosis Date  . Hypertension   . Arthritis   . Ex-smoker   . Rheumatoid arthritis   . Pulmonary nodule   . DDD (degenerative disc disease)    Past Surgical History  Procedure  Laterality Date  . Joint replacement      bilateral knee replacement  . Hernia repair    . Back surgery      X 2  . Appendectomy     Social History:  reports that he quit smoking about 39 years ago. His smoking use included Cigarettes. He has a 15 pack-year smoking history. He has never used smokeless tobacco. He reports that he does not drink alcohol or use illicit drugs. Live with daughter at home  Allergies  Allergen Reactions  . Codeine Other (See Comments)     drives me crazy  . Hydrocodone Other (See Comments)    Makes patient hallucinate, cannot sleep  . Oxycodone Other (See Comments)    Makes patient hallucinate, cannot sleep  . Penicillins Hives  . Sulfa Antibiotics     Family History  Problem Relation Age of Onset  . Coronary artery disease Brother   . Hypertension Father   . Hypertension Mother      Prior to Admission medications   Medication Sig Start Date End Date Taking? Authorizing Provider  albuterol (PROVENTIL) (2.5 MG/3ML) 0.083% nebulizer solution Take 2.5 mg by nebulization every 4 (four) hours as needed. For shortness of breath   Yes Historical Provider, MD  alendronate (FOSAMAX) 70 MG tablet Take 70 mg by mouth every 7 (seven) days. Take with a full glass of water on an empty stomach.  On Friday   Yes Historical Provider, MD  aspirin EC 81 MG tablet Take 81 mg by mouth every morning.    Yes Historical Provider, MD  B Complex-C (SUPER B COMPLEX PO) Take 1 tablet by mouth every morning.    Yes Historical Provider,  MD  Cholecalciferol (VITAMIN D) 2000 UNITS tablet Take 2,000 Units by mouth every morning.    Yes Historical Provider, MD  Fe Fum-FA-B Cmp-C-Zn-Mg-Mn-Cu (HEMATINIC PLUS VIT/MINERALS) 106-1 MG TABS Take 1 tablet by mouth every morning.   Yes Historical Provider, MD  flunisolide (NASAREL) 29 MCG/ACT (0.025%) nasal spray Place 2 sprays into the nose 2 (two) times daily. Dose is for each nostril.   Yes Historical Provider, MD  furosemide (LASIX) 40 MG  tablet Take 20 mg by mouth 2 (two) times daily. 06/13/12 06/13/13 Yes Vassie Loll, MD  gabapentin (NEURONTIN) 100 MG capsule Take 200 mg by mouth 2 (two) times daily.   Yes Historical Provider, MD  leflunomide (ARAVA) 20 MG tablet Take 10 mg by mouth every morning. 06/13/12  Yes Vassie Loll, MD  metoprolol (LOPRESSOR) 50 MG tablet Take 25 mg by mouth 2 (two) times daily.   Yes Historical Provider, MD  omeprazole (PRILOSEC) 20 MG capsule Take 20 mg by mouth every morning.    Yes Historical Provider, MD  predniSONE (DELTASONE) 5 MG tablet Take 5 mg by mouth daily.   Yes Historical Provider, MD  Probiotic Product (ALIGN PO) Take 1 tablet by mouth every morning.   Yes Historical Provider, MD  QUEtiapine (SEROQUEL) 25 MG tablet Take 25 mg by mouth at bedtime.   Yes Historical Provider, MD  silver sulfADIAZINE (SILVADENE) 1 % cream Apply 1 application topically daily. To legs and feet   Yes Historical Provider, MD  Tamsulosin HCl (FLOMAX) 0.4 MG CAPS Take 1 capsule (0.4 mg total) by mouth daily after supper. 06/13/12  Yes Vassie Loll, MD  traMADol (ULTRAM) 50 MG tablet Take 1 tablet (50 mg total) by mouth every 6 (six) hours as needed. For pain 06/13/12  Yes Vassie Loll, MD  zolpidem (AMBIEN) 10 MG tablet Take 10 mg by mouth at bedtime as needed for sleep.   Yes Historical Provider, MD  loperamide (IMODIUM) 2 MG capsule Take 2 mg by mouth 4 (four) times daily as needed. For diarrhea    Historical Provider, MD  Menthol (HALLS COUGH DROPS) 9.1 MG LOZG Use as directed 1 lozenge in the mouth or throat as needed. For cough or sore throught    Historical Provider, MD   Physical Exam: Filed Vitals:   01/03/13 0433 01/03/13 0500 01/03/13 0555  BP: 130/81 112/58   Pulse: 90 80   Temp: 97.9 F (36.6 C)  97.6 F (36.4 C)  TempSrc: Oral  Rectal  Resp: 18 11   SpO2: 91% 93%    BP 112/58  Pulse 80  Temp(Src) 97.6 F (36.4 C) (Rectal)  Resp 11  SpO2 93%  General Appearance:    Alert, cooperative, no  distress, appears stated age  Head:    Normocephalic, without obvious abnormality, atraumatic  Eyes:    PERRL, conjunctiva/corneas clear, EOM's intact, fundi    benign, both eyes , has bilateral crusting of secretions            Throat:   oral hygiene dry mucous membrane   Neck:   Supple, symmetrical, trachea midline, no adenopathy;       thyroid:  No enlargement/tenderness/nodules; no carotid   bruit or JVD  Back:     Symmetric, no curvature, ROM normal, no CVA tenderness  Lungs:     moderate air movement, with wheezing bilaterally   Chest wall:    No tenderness or deformity  Heart:    Regular rate and rhythm, S1 and S2 normal, no  murmur, rub   or gallop  Abdomen:     Soft, non-tender, bowel sounds active all four quadrants,    no masses, no organomegaly        Extremities:   Extremities normal, atraumatic, no cyanosis or edema  Pulses:   2+ and symmetric all extremities  Skin:   he has bilateral lower extremity edema, with scabs bilaterally. It's warm to touch .  Lymph nodes:   Cervical, supraclavicular, and axillary nodes normal  Neurologic:   CNII-XII intact. Normal strength, sensation and reflexes      throughout     Labs on Admission:  Basic Metabolic Panel:  Recent Labs Lab 01/03/13 0507 01/03/13 0515  NA 141 139  K 3.1* 3.2*  CL 94* 97  CO2 35*  --   GLUCOSE 107* 109*  BUN 52* 53*  CREATININE 2.45* 2.50*  CALCIUM 10.3  --    Liver Function Tests:  Recent Labs Lab 01/03/13 0507  AST 21  ALT 14  ALKPHOS 97  BILITOT 0.3  PROT 6.7  ALBUMIN 2.9*   No results found for this basename: LIPASE, AMYLASE,  in the last 168 hours No results found for this basename: AMMONIA,  in the last 168 hours CBC:  Recent Labs Lab 01/03/13 0507 01/03/13 0515  WBC 9.1  --   HGB 12.1* 12.2*  HCT 36.7* 36.0*  MCV 88.4  --   PLT 201  --    Cardiac Enzymes: No results found for this basename: CKTOTAL, CKMB, CKMBINDEX, TROPONINI,  in the last 168 hours  BNP (last 3  results)  Recent Labs  03/06/12 1505 06/05/12 0524 01/03/13 0552  PROBNP 173.7 506.6* 602.9*   CBG: No results found for this basename: GLUCAP,  in the last 168 hours  Radiological Exams on Admission: Dg Chest Portable 1 View  01/03/2013  *RADIOLOGY REPORT*  Clinical Data: Shortness of breath.  PORTABLE CHEST - 1 VIEW  Comparison: Chest radiograph performed 06/06/2012  Findings: The lungs are mildly hypoexpanded.  Mild bibasilar airspace opacities may reflect atelectasis or possibly mild pneumonia.  The cardiomediastinal silhouette is borderline normal in size.  The patient is status post median sternotomy.  No acute osseous abnormalities are seen.  IMPRESSION: Lungs mildly hypoexpanded; mild bibasilar airspace opacities may reflect atelectasis or possibly mild pneumonia.   Original Report Authenticated By: Tonia Ghent, M.D.     EKG: Sinus rhythm with first degree AV block LVH by aVL criteria no ST segment changes  Assessment/Plan Dyspnea/  COPD exacerbation/generalizded weakness: - Steroids, albuterol and empiric antibiotics. Start him on doxycycline, IV Solu-Medrol. We'll continue his home dose per your, we'll schedule albuterol every 4 hours and every 2 hours when necessary.  - I will go ahead and admit him to the telemetry units as his sats have been above 90% on 2 L. We'll monitor his saturations.  Bilateral cellulitis of lower leg: - New bilateral extremity cellulitis warm to touch mild edematous and erythema. Doxy should cover.  Dysuria: - UA shows significant white blood cells and a few bacteria. He was recently treated with Bactrim double ahead and get urine cultures. He is complaining of some dysuria. But his urine cultures come back positive. I will not start him on empiric antibiotics. Continue Flomax.  HYPERTENSION: - Actually well controlled. We'll hold his Lasix , continue his beta blocker.   ARF (acute renal failure) - Start on gentle IV fluids continue to measure  strict I.'s and os. Also check urinary sodium and urinary  creatinine. His baseline creatinine 0.9, today is 2.5. - He seems to be intravascularly depleted as his mouth is dry. We'll go ahead and check orthostatics. He was also on Bactrim which can be contributing to his acute renal failure. He has completed this treatment 2 days prior to admission.  Physical deconditioning: - PT and OT.   Hypokalemia: - Mostly secondary to decreased intravascular volume. We'll give him one dose of potassium. He does have new acute renal failure. We'll check a basic metabolic panel in the morning.   Anemia: - Continue iron replacement therapy.  \Code Status: FULL CODE  Family Communication:  Disposition Plan: Return to Home   Time spent: 70 minutes  Marinda Elk [facility] Pager [phone]  If 7PM-7AM, please contact night-coverage [email or web address] Password [name] 01/03/2013, 8:39 AM

## 2013-01-03 NOTE — ED Notes (Signed)
Per EMS, pt diagnosed with UTI in august and continues to have problems. Upon arrival o2 sats in 80s, given breathing treatment. Sats rose to 94%. Pt. Also c/o pain in right leg with swelling.

## 2013-01-03 NOTE — ED Notes (Signed)
Per daughter, pt diagnosed with UTI in august and haven't been able to get rid of it. Also states left leg pain, tender to touch. Bilaterally edema in lower legs. Pt. Wheezing with EMS given albuterol treatment. Daughter states pt has home nebulizer that he uses.

## 2013-01-03 NOTE — Progress Notes (Signed)
Called by Dr. Dierdre Highman regarding admission for this patient.  Patient is an elderly 79 year old gentleman with multiple medical problems who is coming to the ER from home. He is noted to be short of breath and hypoxic. Chest x-ray indicates possible developing pneumonia. He's been started on Levaquin by EDP. Oxygen saturations improved with oxygen. He's also noted to be dehydrated and in acute on chronic renal failure. Per conversation with EDP, telemetry bed request has been made,: Orders have been requested. We'll defer to a.m. team regarding further admission.

## 2013-01-03 NOTE — Progress Notes (Signed)
PHARMACIST - PHYSICIAN COMMUNICATION  CONCERNING: P&T Medication Policy Regarding Oral Bisphosphonates  RECOMMENDATION: Your order for alendronate (Fosamax) has been discontinued at this time.  If the patient's post-hospital medical condition warrants safe use of this class of drugs, please resume the pre-hospital regimen upon discharge.  DESCRIPTION:  Alendronate (Fosamax) can cause severe esophageal erosions in patients who are unable to remain upright at least 30 minutes after taking this medication.   Since brief interruptions in therapy are thought to have minimal impact on bone mineral density, the Pharmacy & Therapeutics Committee has established that bisphosphonate orders should be routinely discontinued during hospitalization.   To override this safety policy and permit administration of Fosamax in the hospital, prescribers must write "DO NOT HOLD" in the comments section when placing the order for this class of medications.  Dorna Leitz, PharmD, BCPS

## 2013-01-03 NOTE — Progress Notes (Signed)
Utilization Review Completed Seairra Otani J. Trenda Corliss, RN, BSN, NCM [phone]  

## 2013-01-04 DIAGNOSIS — R0902 Hypoxemia: Secondary | ICD-10-CM

## 2013-01-04 LAB — URINE CULTURE
Colony Count: NO GROWTH
Culture: NO GROWTH

## 2013-01-04 LAB — COMPREHENSIVE METABOLIC PANEL
Alkaline Phosphatase: 80 U/L (ref 39–117)
BUN: 57 mg/dL — ABNORMAL HIGH (ref 6–23)
Chloride: 102 mEq/L (ref 96–112)
Creatinine, Ser: 2.22 mg/dL — ABNORMAL HIGH (ref 0.50–1.35)
GFR calc Af Amer: 30 mL/min — ABNORMAL LOW (ref >90)
GFR calc non Af Amer: 26 mL/min — ABNORMAL LOW (ref >90)
Glucose, Bld: 133 mg/dL — ABNORMAL HIGH (ref 70–99)
Potassium: 4.4 mEq/L (ref 3.5–5.1)
Total Bilirubin: 0.2 mg/dL — ABNORMAL LOW (ref 0.3–1.2)

## 2013-01-04 LAB — CBC
Platelets: 204 10*3/uL (ref 150–400)
RBC: 3.66 MIL/uL — ABNORMAL LOW (ref 4.22–5.81)
RDW: 16.1 % — ABNORMAL HIGH (ref 11.5–15.5)
WBC: 8 10*3/uL (ref 4.0–10.5)

## 2013-01-04 MED ORDER — METHYLPREDNISOLONE SODIUM SUCC 125 MG IJ SOLR
60.0000 mg | INTRAMUSCULAR | Status: DC
  Administered 2013-01-05 – 2013-01-06 (×2): 60 mg via INTRAVENOUS
  Filled 2013-01-04 (×2): qty 0.96

## 2013-01-04 MED ORDER — DEXTROSE 5 % IV SOLN
INTRAVENOUS | Status: AC
  Filled 2013-01-04: qty 50

## 2013-01-04 MED ORDER — TRAMADOL HCL 50 MG PO TABS
50.0000 mg | ORAL_TABLET | Freq: Four times a day (QID) | ORAL | Status: DC
  Filled 2013-01-04 (×4): qty 1

## 2013-01-04 MED ORDER — CEFUROXIME SODIUM 1.5 G IJ SOLR
INTRAMUSCULAR | Status: AC
  Filled 2013-01-04: qty 1.5

## 2013-01-04 MED ORDER — TRAMADOL HCL 50 MG PO TABS
50.0000 mg | ORAL_TABLET | Freq: Four times a day (QID) | ORAL | Status: DC | PRN
  Administered 2013-01-04 – 2013-01-05 (×4): 50 mg via ORAL
  Filled 2013-01-04 (×3): qty 1

## 2013-01-04 MED ORDER — DOXYCYCLINE HYCLATE 100 MG PO TABS
100.0000 mg | ORAL_TABLET | Freq: Two times a day (BID) | ORAL | Status: DC
  Administered 2013-01-04 – 2013-01-06 (×5): 100 mg via ORAL
  Filled 2013-01-04 (×7): qty 1

## 2013-01-04 NOTE — Progress Notes (Deleted)
Physical Therapy Treatment Patient Details Name: [name] MRN: [id number] DOB: 17-May-1928 Today's Date: 01/04/2013 Time: 4098-1191 PT Time Calculation (min): 19 min  PT Assessment / Plan / Recommendation Comments on Treatment Session  Pt making progress with mobility at this date but still limited by pain & weakness.  Pt required decreased (A) for bed mobility & sit<>stand + Stand pivot transfers.       Follow Up Recommendations  SNF     Does the patient have the potential to tolerate intense rehabilitation     Barriers to Discharge        Equipment Recommendations  None recommended by PT    Recommendations for Other Services    Frequency Min 3X/week   Plan Discharge plan remains appropriate    Precautions / Restrictions Precautions Precautions: Fall Restrictions Weight Bearing Restrictions: No   Pertinent Vitals/Pain C/o pain in hips & knees.  RN notified.      Mobility  Bed Mobility Bed Mobility: Supine to Sit Supine to Sit: 4: Min guard;With rails;HOB elevated Sitting - Scoot to Edge of Bed: 4: Min guard;With rail Details for Bed Mobility Assistance: S for pt to scoot laterally in bed while still supine to get closer to EOB before sitting up. Transfers Transfers: Sit to Stand;Stand to Sit;Stand Pivot Transfers Sit to Stand: 3: Mod assist;With upper extremity assist;From bed Stand to Sit: 3: Mod assist;With upper extremity assist;With armrests;To chair/3-in-1 Stand Pivot Transfers: 1: +2 Total assist Stand Pivot Transfers: Patient Percentage: 70% Details for Transfer Assistance: VCs for safe hand placement    Exercises       PT Goals Acute Rehab PT Goals Time For Goal Achievement: 01/17/13 Potential to Achieve Goals: Good Pt will go Supine/Side to Sit: with modified independence;with HOB 0 degrees PT Goal: Supine/Side to Sit - Progress: Progressing toward goal Pt will go Sit to Stand: with modified independence PT Goal: Sit to Stand - Progress:  Progressing toward goal Pt will go Stand to Sit: with modified independence;with upper extremity assist PT Goal: Stand to Sit - Progress: Progressing toward goal Pt will Transfer Bed to Chair/Chair to Bed: with modified independence PT Transfer Goal: Bed to Chair/Chair to Bed - Progress: Progressing toward goal Pt will Ambulate: 51 - 150 feet;with modified independence;with rolling walker  Visit Information  Last PT Received On: 01/04/13 Assistance Needed: +2    Subjective Data      Cognition  Cognition Overall Cognitive Status: Impaired (Simultaneous filing. User may not have seen previous data.) Arousal/Alertness: Awake/alert (Simultaneous filing. User may not have seen previous data.) Orientation Level: Appears intact for tasks assessed Behavior During Session: [facility] for tasks performed    Balance     End of Session PT - End of Session Equipment Utilized During Treatment: Gait belt Patient left: in chair;with call bell/phone within reach Nurse Communication: Mobility status     Verdell Face, [place] [phone] 01/04/2013

## 2013-01-04 NOTE — Progress Notes (Signed)
PT Progress Note:     01/04/13 1002  PT Visit Information  Last PT Received On 01/04/13  Assistance Needed +2  PT Time Calculation  PT Start Time 0844  PT Stop Time 0903  PT Time Calculation (min) 19 min  Precautions  Precautions Fall  Restrictions  Weight Bearing Restrictions No  Cognition  Overall Cognitive Status Impaired (Simultaneous filing. User may not have seen previous data.)  Arousal/Alertness Awake/alert (Simultaneous filing. User may not have seen previous data.)  Orientation Level Appears intact for tasks assessed  Behavior During Session [facility] for tasks performed  Bed Mobility  Bed Mobility Supine to Sit;Sitting - Scoot to Edge of Bed  Supine to Sit 4: Min guard;HOB flat;With rails  Sitting - Scoot to Edge of Bed 4: Min guard  Details for Bed Mobility Assistance No physical (A) needed.    Transfers  Transfers Sit to Stand;Stand to [employer] Transfers  Sit to Stand 3: Mod assist  Stand to Sit 3: Mod assist  Stand Pivot Transfers 1: +2 Total assist  Stand Pivot Transfers: Patient Percentage 70%  Details for Transfer Assistance Cues for hand placement & technique.  (A) to achieve standing, balance, RW management with pivot from bed>recliner, & controlled descent.  Pt able to take pivotal steps from bed>recliner but unable to achieve complete upright posture due to pain in hips & knees.    PT - End of Session  Equipment Utilized During Treatment Gait belt  Patient left in chair;with call bell/phone within reach  Nurse Communication Mobility status  PT - Assessment/Plan  Comments on Treatment Session Pt making progress with mobility at this date but still limited by pain & weakness.  Pt required decreased (A) for bed mobility & sit<>stand + Stand pivot transfers.     PT Plan Discharge plan remains appropriate  PT Frequency Min 3X/week  Follow Up Recommendations SNF  PT equipment None recommended by PT  Acute Rehab PT Goals  Time For Goal Achievement 01/17/13   Potential to Achieve Goals Good  Pt will go Supine/Side to Sit with modified independence;with HOB 0 degrees  PT Goal: Supine/Side to Sit - Progress Progressing toward goal  Pt will go Sit to Stand with modified independence  PT Goal: Sit to Stand - Progress Progressing toward goal  Pt will go Stand to Sit with modified independence;with upper extremity assist  PT Goal: Stand to Sit - Progress Progressing toward goal  Pt will Transfer Bed to Chair/Chair to Bed with modified independence  PT Transfer Goal: Bed to Chair/Chair to Bed - Progress Progressing toward goal  Pt will Ambulate 51 - 150 feet;with modified independence;with rolling walker  PT General Charges  $$ ACUTE PT VISIT 1 Procedure  PT Treatments  $Therapeutic Activity 8-22 mins    Verdell Face, [place] [phone] 01/04/2013

## 2013-01-04 NOTE — Progress Notes (Signed)
[facility]             Progress Note   Subjective: Complains of bilateral lower extremity pain. No family present at bedside.  Objective: Vital signs in last 24 hours: Temp:  [97.4 F (36.3 C)-98.7 F (37.1 C)] 98.7 F (37.1 C) (04/09 1300) Pulse Rate:  [73-97] 78 (04/09 1300) Resp:  [20] 20 (04/09 1300) BP: (120-150)/(52-86) 133/52 mmHg (04/09 1300) SpO2:  [89 %-98 %] 98 % (04/09 1300) Weight:  [79.561 kg (175 lb 6.4 oz)] 79.561 kg (175 lb 6.4 oz) (04/09 0657) Weight change:  Last BM Date: 01/02/13  Intake/Output from previous day: 04/08 0701 - 04/09 0700 In: 2356.8 [P.O.:720; I.V.:1136.8; IV Piggyback:500] Out: 400 [Urine:400] Total I/O In: 360 [P.O.:360] Out: 200 [Urine:200]   Physical Exam: General: Alert, awake, oriented x3. HEENT: No bruits, no goiter., Poor dentition. Heart: Regular rate and rhythm, without murmurs, rubs, gallops. Lungs: Clear to auscultation bilaterally. Abdomen: Soft, nontender, nondistended, positive bowel sounds. Extremities: 1-2+ edema bilaterally, mild erythema. Neuro: Grossly intact, nonfocal.    Lab Results: Basic Metabolic Panel:  Recent Labs  78/29/56 0507 01/03/13 0515 01/03/13 1115 01/04/13 0505  NA 141 139  --  140  K 3.1* 3.2*  --  4.4  CL 94* 97  --  102  CO2 35*  --   --  30  GLUCOSE 107* 109*  --  133*  BUN 52* 53*  --  57*  CREATININE 2.45* 2.50* 2.44* 2.22*  CALCIUM 10.3  --   --  9.3   Liver Function Tests:  Recent Labs  01/03/13 0507 01/04/13 0505  AST 21 15  ALT 14 12  ALKPHOS 97 80  BILITOT 0.3 0.2*  PROT 6.7 5.7*  ALBUMIN 2.9* 2.4*   CBC:  Recent Labs  01/03/13 1115 01/04/13 0505  WBC 6.8 8.0  HGB 11.3* 10.8*  HCT 34.2* 32.3*  MCV 89.1 88.3  PLT 199 204   BNP:  Recent Labs  01/03/13 0552  PROBNP 602.9*   Urinalysis:  Recent Labs  01/03/13 0605  COLORURINE YELLOW  LABSPEC 1.014  PHURINE 5.5  GLUCOSEU NEGATIVE  HGBUR MODERATE*  BILIRUBINUR NEGATIVE   KETONESUR NEGATIVE  PROTEINUR NEGATIVE  UROBILINOGEN 0.2  NITRITE NEGATIVE  LEUKOCYTESUR LARGE*    Recent Results (from the past 240 hour(s))  URINE CULTURE     Status: None   Collection Time    01/03/13  6:05 AM      Result Value Range Status   Specimen Description URINE, CATHETERIZED   Final   Special Requests ADDED 0648   Final   Culture  Setup Time 01/03/2013 14:32   Final   Colony Count NO GROWTH   Final   Culture NO GROWTH   Final   Report Status 01/04/2013 FINAL   Final  CULTURE, BLOOD (ROUTINE X 2)     Status: None   Collection Time    01/03/13 11:15 AM      Result Value Range Status   Specimen Description BLOOD LEFT HAND   Final   Special Requests     Final   Value: BOTTLES DRAWN AEROBIC AND ANAEROBIC 10CC BLUE  5CC RED   Culture  Setup Time 01/03/2013 13:35   Final   Culture     Final   Value:        BLOOD CULTURE RECEIVED NO GROWTH TO DATE CULTURE WILL BE HELD FOR 5 DAYS BEFORE ISSUING A FINAL NEGATIVE REPORT   Report Status PENDING  Incomplete  CULTURE, BLOOD (ROUTINE X 2)     Status: None   Collection Time    01/03/13 11:26 AM      Result Value Range Status   Specimen Description BLOOD RIGHT HAND   Final   Special Requests     Final   Value: BOTTLES DRAWN AEROBIC AND ANAEROBIC 10CC BLUE  5CC RED   Culture  Setup Time 01/03/2013 13:35   Final   Culture     Final   Value:        BLOOD CULTURE RECEIVED NO GROWTH TO DATE CULTURE WILL BE HELD FOR 5 DAYS BEFORE ISSUING A FINAL NEGATIVE REPORT   Report Status PENDING   Incomplete  URINE CULTURE     Status: None   Collection Time    01/03/13  6:31 PM      Result Value Range Status   Specimen Description URINE, RANDOM   Final   Special Requests NONE   Final   Culture  Setup Time 01/03/2013 19:02   Final   Colony Count 9,000 COLONIES/ML   Final   Culture INSIGNIFICANT GROWTH   Final   Report Status 01/04/2013 FINAL   Final    Studies/Results: Dg Chest Portable 1 View  01/03/2013  *RADIOLOGY REPORT*  Clinical  Data: Shortness of breath.  PORTABLE CHEST - 1 VIEW  Comparison: Chest radiograph performed 06/06/2012  Findings: The lungs are mildly hypoexpanded.  Mild bibasilar airspace opacities may reflect atelectasis or possibly mild pneumonia.  The cardiomediastinal silhouette is borderline normal in size.  The patient is status post median sternotomy.  No acute osseous abnormalities are seen.  IMPRESSION: Lungs mildly hypoexpanded; mild bibasilar airspace opacities may reflect atelectasis or possibly mild pneumonia.   Original Report Authenticated By: Tonia Ghent, M.D.     Medications: Scheduled Meds: . albuterol  2.5 mg Nebulization BID  . aspirin EC  81 mg Oral q morning - 10a  . cefUROXime      . dextrose      . doxycycline  100 mg Oral Q12H  . heparin  5,000 Units Subcutaneous Q8H  . ipratropium  0.5 mg Nebulization BID  . leflunomide  10 mg Oral q morning - 10a  . methylPREDNISolone (SOLU-MEDROL) injection  60 mg Intravenous Q12H  . metoprolol  25 mg Oral BID  . pantoprazole  40 mg Oral Daily  . QUEtiapine  25 mg Oral QHS  . sodium chloride  3 mL Intravenous Q12H  . tamsulosin  0.4 mg Oral QPC supper  . tiotropium  18 mcg Inhalation Daily   Continuous Infusions: . sodium chloride 75 mL/hr at 01/04/13 1355   PRN Meds:.acetaminophen, acetaminophen, albuterol, ondansetron (ZOFRAN) IV, ondansetron, polyethylene glycol, traMADol, zolpidem  Assessment/Plan:  Active Problems:   HYPERTENSION   Bilateral cellulitis of lower leg   ARF (acute renal failure)   Anemia   Physical deconditioning   Dyspnea   COPD exacerbation   Dysuria   Hypokalemia   CKD (chronic kidney disease) stage 3, GFR 30-59 ml/min   Generalized weakness/adult failure to thrive -Etiology remains unclear at this point. -He does have a possible urinary tract infection, possible COPD exacerbation, possibly lower extremity cellulitis as well as a history of COPD and recurrent arthritis. -He has been seen by physical  therapy who is recommending skilled nursing facility placement. -Social work has been alerted.  Acute on chronic kidney disease stage III -Baseline creatinine appears to be around 1.5-1.8. -Creatinine initially was 2.4 and is down to 2.2  today. -He does appear clinically dry, we'll continue saline at 75 cc an hour.  Bilateral lower extremity cellulitis -He does have some mild lower extremity erythema that could possibly represent cellulitis. -Blood cultures have been negative to date. -Continue doxycycline.  COPD with acute exacerbation -No current wheezing, does not feel short of breath. -We'll continue to titrate steroids today.  Questionable UTI -He was recently treated with a course of Bactrim. UA does show some bacteria and white cells. -Will await culture data prior to treating given he has been on multiple courses of antibiotics recently.  Disposition -PT is recommending SNF. -Social work is aware.    Time spent coordinating care: 35 minutes.   LOS: 1 day   [facility] Pager: [phone] 01/04/2013, 3:32 PM

## 2013-01-04 NOTE — Evaluation (Signed)
Occupational Therapy Evaluation Patient Details Name: [name] MRN: [id number] DOB: 12/02/1927 Today's Date: 01/04/2013 Time: 8469-6295 OT Time Calculation (min): 16 min  OT Assessment / Plan / Recommendation Clinical Impression  This 79 yo male admitted to [facility] for weakness, leg pain, leg swelling, inability to walk.  Dx with dyspnea/COPD exacerbation, acute renal failure, hypokalemia, and deconditioning.  Also, of note recent h/o UTI and urine still has some positive cultures presents to acute OT with problems below. Will benefit from OT at [facility].    OT Assessment  Patient needs continued OT Services    Follow Up Recommendations  SNF    Barriers to Discharge Decreased caregiver support    Equipment Recommendations  None recommended by OT       Frequency  Min 2X/week    Precautions / Restrictions Precautions Precautions: Fall Restrictions Weight Bearing Restrictions: No   Pertinent Vitals/Pain Bil hips with sitting up EOB and standing; repositioned and made RN aware    ADL  Eating/Feeding: Simulated;Set up Where Assessed - Eating/Feeding: Chair Grooming: Simulated;Set up Where Assessed - Grooming: Supported sitting Upper Body Bathing: Simulated;Set up Where Assessed - Upper Body Bathing: Supported sitting Lower Body Bathing: Simulated;+1 Total assistance Where Assessed - Lower Body Bathing: Supported sit to stand Upper Body Dressing: Simulated;Moderate assistance Where Assessed - Upper Body Dressing: Supported sitting Lower Body Dressing: Simulated;+1 Total assistance Where Assessed - Lower Body Dressing: Supported sit to Pharmacist, hospital: Chief of Staff: Patient Percentage: 70% Statistician Method: Surveyor, minerals:  (Bed to recliner going to pt's right) Toileting - Clothing Manipulation and Hygiene: Simulated;+1 Total assistance Where Assessed - Engineer, mining and Hygiene:  Standing Equipment Used: Rolling walker;Gait belt Transfers/Ambulation Related to ADLs: Mod A sit to stand and stand to sit, total A +2 (pt=70%) ambulation in room    OT Diagnosis: Generalized weakness;Acute pain  OT Problem List: Decreased strength;Pain;Impaired balance (sitting and/or standing);Decreased activity tolerance;Decreased knowledge of use of DME or AE;Cardiopulmonary status limiting activity OT Treatment Interventions: Self-care/ADL training;Therapeutic activities;DME and/or AE instruction;Patient/family education;Balance training   OT Goals Acute Rehab OT Goals OT Goal Formulation: With patient Time For Goal Achievement: 01/18/13 Potential to Achieve Goals: Good ADL Goals Pt Will Transfer to Toilet: with min assist;Ambulation;3-in-1;Grab bars;Regular height toilet ADL Goal: Toilet Transfer - Progress: Goal set today Miscellaneous OT Goals Miscellaneous OT Goal #1: Pt will be Mod I to get up to EOB and get back in bed for BADLs OT Goal: Miscellaneous Goal #1 - Progress: Goal set today Miscellaneous OT Goal #2: Pt will be able to stand with min guard A while someone A him with LB ADLs OT Goal: Miscellaneous Goal #2 - Progress: Goal set today  Visit Information  Last OT Received On: 01/04/13 Assistance Needed: +2 PT/OT Co-Evaluation/Treatment: Yes       Prior Functioning     Home Living Lives With: Daughter Available Help at Discharge: Available PRN/intermittently;Family Type of Home: House Home Access: Ramped entrance Home Layout: One level;Other (Comment) Bathroom Shower/Tub: Walk-in shower;Door Bathroom Toilet: Standard Home Adaptive Equipment: Bedside commode/3-in-1;Grab bars around toilet;Grab bars in shower;Hand-held shower hose;Walker - rolling;Wheelchair - manual Additional Comments: normally, pt would use RW for gait when needed, but not all of the time.   Prior Function Level of Independence: Needs assistance Needs Assistance: Meal Prep;Light  Housekeeping Meal Prep: Total Light Housekeeping: Total Able to Take Stairs?: No Driving: Yes Vocation: Retired Musician: No difficulties Dominant Hand: Right  Vision/Perception Vision - History Baseline Vision: No visual deficits Patient Visual Report: No change from baseline   Cognition  Cognition Overall Cognitive Status: Impaired (Simultaneous filing. User may not have seen previous data.) Arousal/Alertness: Awake/alert (Simultaneous filing. User may not have seen previous data.) Orientation Level: Appears intact for tasks assessed Behavior During Session: [facility] for tasks performed    Extremity/Trunk Assessment Right Upper Extremity Assessment RUE ROM/Strength/Tone: Within functional levels Left Upper Extremity Assessment LUE ROM/Strength/Tone: Within functional levels     Mobility Bed Mobility Bed Mobility: Supine to Sit Supine to Sit: 4: Min guard;With rails;HOB elevated Sitting - Scoot to Edge of Bed: 4: Min guard;With rail Details for Bed Mobility Assistance: S for pt to scoot laterally in bed while still supine to get closer to EOB before sitting up. Transfers Transfers: Sit to Stand;Not assessed Sit to Stand: 3: Mod assist;With upper extremity assist;From bed Stand to Sit: 3: Mod assist;With upper extremity assist;With armrests;To chair/3-in-1 Details for Transfer Assistance: VCs for safe hand placement           End of Session OT - End of Session Equipment Utilized During Treatment: Gait belt Activity Tolerance: Patient limited by pain Patient left: in chair;with call bell/phone within reach Nurse Communication:  (NT: how to get pt back to bed, and condom cath off)    Evette Georges [phone] 01/04/2013, 10:11 AM

## 2013-01-05 LAB — BASIC METABOLIC PANEL
CO2: 31 mEq/L (ref 19–32)
Chloride: 108 mEq/L (ref 96–112)
Glucose, Bld: 100 mg/dL — ABNORMAL HIGH (ref 70–99)
Potassium: 3.9 mEq/L (ref 3.5–5.1)
Sodium: 145 mEq/L (ref 135–145)

## 2013-01-05 LAB — CBC
Hemoglobin: 10.8 g/dL — ABNORMAL LOW (ref 13.0–17.0)
MCH: 29.2 pg (ref 26.0–34.0)
MCV: 89.2 fL (ref 78.0–100.0)
RBC: 3.7 MIL/uL — ABNORMAL LOW (ref 4.22–5.81)
WBC: 8.3 10*3/uL (ref 4.0–10.5)

## 2013-01-05 LAB — RPR: RPR Ser Ql: NONREACTIVE

## 2013-01-05 MED ORDER — DOXYCYCLINE HYCLATE 100 MG PO TABS: 100.0000 mg | ORAL_TABLET | Freq: Two times a day (BID) | ORAL | Status: DC

## 2013-01-05 MED ORDER — PREDNISONE 10 MG PO TABS: 10.0000 mg | ORAL_TABLET | Freq: Every day | ORAL | Status: DC

## 2013-01-05 NOTE — Progress Notes (Signed)
Pt with 14 bt burst of SVT. Pt asymptomatic. Pt just received PO scheduled metoprolol. MD notified. WIll continue to monitor. Baron Hamper, RN 01/05/2013 10:37 PM

## 2013-01-05 NOTE — Discharge Summary (Signed)
Physician Discharge Summary  Patient ID: [name] MRN: [id number] DOB/AGE: Nov 05, 1927 79 y.o.  Admit date: 01/03/2013 Discharge date: 01/05/2013  Primary Care Physician:  [name], MD   Discharge Diagnoses:    Active Problems:   HYPERTENSION   Bilateral cellulitis of lower leg   ARF (acute renal failure)   Anemia   Physical deconditioning   Dyspnea   COPD exacerbation   Dysuria   Hypokalemia   CKD (chronic kidney disease) stage 3, GFR 30-59 ml/min      Medication List    STOP taking these medications       zolpidem 10 MG tablet  Commonly known as:  AMBIEN      TAKE these medications       albuterol (2.5 MG/3ML) 0.083% nebulizer solution  Commonly known as:  PROVENTIL  Take 2.5 mg by nebulization every 4 (four) hours as needed. For shortness of breath     alendronate 70 MG tablet  Commonly known as:  FOSAMAX  Take 70 mg by mouth every 7 (seven) days. Take with a full glass of water on an empty stomach.  On Friday     ALIGN PO  Take 1 tablet by mouth every morning.     aspirin EC 81 MG tablet  Take 81 mg by mouth every morning.     doxycycline 100 MG tablet  Commonly known as:  VIBRA-TABS  Take 1 tablet (100 mg total) by mouth every 12 (twelve) hours.     flunisolide 29 MCG/ACT (0.025%) nasal spray  Commonly known as:  NASAREL  Place 2 sprays into the nose 2 (two) times daily. Dose is for each nostril.     furosemide 40 MG tablet  Commonly known as:  LASIX  Take 20 mg by mouth 2 (two) times daily.     gabapentin 100 MG capsule  Commonly known as:  NEURONTIN  Take 200 mg by mouth 2 (two) times daily.     HALLS COUGH DROPS 9.1 MG Lozg  Generic drug:  Menthol  Use as directed 1 lozenge in the mouth or throat as needed. For cough or sore throught     Hematinic Plus Vit/Minerals 106-1 MG Tabs  Take 1 tablet by mouth every morning.     leflunomide 20 MG tablet  Commonly known as:  ARAVA  Take 10 mg by mouth every morning.     loperamide 2  MG capsule  Commonly known as:  IMODIUM  Take 2 mg by mouth 4 (four) times daily as needed. For diarrhea     metoprolol 50 MG tablet  Commonly known as:  LOPRESSOR  Take 25 mg by mouth 2 (two) times daily.     omeprazole 20 MG capsule  Commonly known as:  PRILOSEC  Take 20 mg by mouth every morning.     predniSONE 10 MG tablet  Commonly known as:  DELTASONE  Take 1 tablet (10 mg total) by mouth daily. Take 6 tablets today and decrease by 1 tablet daily; then maintain at home dose of 5 mg daily.     predniSONE 5 MG tablet  Commonly known as:  DELTASONE  Take 5 mg by mouth daily.     QUEtiapine 25 MG tablet  Commonly known as:  SEROQUEL  Take 25 mg by mouth at bedtime.     silver sulfADIAZINE 1 % cream  Commonly known as:  SILVADENE  Apply 1 application topically daily. To legs and feet     SUPER B COMPLEX PO  Take 1 tablet by mouth every morning.     tamsulosin 0.4 MG Caps  Commonly known as:  FLOMAX  Take 1 capsule (0.4 mg total) by mouth daily after supper.     traMADol 50 MG tablet  Commonly known as:  ULTRAM  Take 1 tablet (50 mg total) by mouth every 6 (six) hours as needed. For pain     Vitamin D 2000 UNITS tablet  Take 2,000 Units by mouth every morning.         Disposition and Follow-up:  Patient will be discharged to skilled nursing facility in stable condition.  Consults:  None   Significant Diagnostic Studies:  No results found.  Brief H and P: For complete details please refer to admission H and P, but in brief patient is an 79 year old man with a history of hypertension rheumatoid arthritis and an ex-smoker, he came into the hospital for generalized weakness that began a few days prior to admission, shortness of breath as well as bilateral lower extremity edema and erythema. Patient relates that he just recently was treated for urinary tract infection and cultures that grew Pseudomonas. He finished a treatment with Bactrim 2 days prior to admission.  The weakness is not made better or worse by anything. His lower extremity erythema is progressively getting worse. Because of these issues we were asked to admit him for further evaluation and management.    Hospital Course:  Active Problems:   HYPERTENSION   Bilateral cellulitis of lower leg   ARF (acute renal failure)   Anemia   Physical deconditioning   Dyspnea   COPD exacerbation   Dysuria   Hypokalemia   CKD (chronic kidney disease) stage 3, GFR 30-59 ml/min   Generalized weakness/adult failure to thrive -Etiology remains unclear at this point. -Possibly related to his COPD exacerbation or lower [place] cellulitis as well as his history of rheumatoid arthritis. -Physical therapy is recommending short-term skilled nursing facility placement for rehabilitation purposes. He will be leaving [place] today.  Acute on chronic kidney disease stage III -His baseline creatinine is 1.5-1.8. -Creatinine was 2.4 on admission and is down to his baseline of 1.6 at time of discharge. -I suspect he certainly had some degree of prerenal azotemia as he did appear dehydrated on exam.  Bilateral lower extremity cellulitis -Blood cultures have been negative to date. -Will be discharged on 14 days of doxycycline.  COPD with acute exacerbation -Shortness of breath has improved significantly. -He is currently not wheezing. -We'll continue to titrate steroids. He does take 5 mg of prednisone on a daily basis.  Questionable UTI -Culture returned without growth despite UA showing some bacteria and white cells. -I have elected not to treat him at this time given he recently completed a course of Bactrim for Pseudomonas UTI.    Time spent on Discharge: Greater than 30 minutes  Signed: Chaya Jan [facility] Pager: [phone] 01/05/2013, 10:18 AM

## 2013-01-06 NOTE — Progress Notes (Signed)
Physical Therapy Treatment Patient Details Name: [name] MRN: [id number] DOB: 02/19/1928 Today's Date: 01/06/2013 Time: 4098-1191 PT Time Calculation (min): 40 min  PT Assessment / Plan / Recommendation Comments on Treatment Session  Pt making slow progress still significantly limited by the pain at 9-10/10 in L>R LE    Follow Up Recommendations  SNF     Does the patient have the potential to tolerate intense rehabilitation     Barriers to Discharge        Equipment Recommendations  None recommended by PT    Recommendations for Other Services    Frequency Min 3X/week   Plan Discharge plan remains appropriate    Precautions / Restrictions Precautions Precautions: Fall   Pertinent Vitals/Pain 9/10 pain or more    Mobility  Bed Mobility Bed Mobility: Not assessed Transfers Transfers: Sit to Stand;Stand to Sit;Stand Pivot Transfers Sit to Stand: 1: +2 Total assist;With upper extremity assist;From chair/3-in-1;From bed Sit to Stand: Patient Percentage: 50% ( ) Stand to Sit: 1: +2 Total assist;With upper extremity assist;To chair/3-in-1 Stand to Sit: Patient Percentage: 50% Stand Pivot Transfers: 1: +2 Total assist Stand Pivot Transfers: Patient Percentage: 50% Details for Transfer Assistance: VCs for safe hand placement; assist to come forward Ambulation/Gait Ambulation/Gait Assistance: 1: +2 Total assist Ambulation/Gait: Patient Percentage: 50% Ambulation Distance (Feet): 7 Feet (forward and back x3 with RW ) Assistive device: Rolling walker Ambulation/Gait Assistance Details: effortful gait with consistently flexed knees and back.  Improved with each trial. Gait Pattern: Step-through pattern;Decreased step length - right;Decreased step length - left;Decreased stride length;Trunk flexed Stairs: No    Exercises     PT Diagnosis:    PT Problem List:   PT Treatment Interventions:     PT Goals Acute Rehab PT Goals Potential to Achieve Goals: Good Pt will go  Sit to Stand: with modified independence PT Goal: Sit to Stand - Progress: Not met Pt will go Stand to Sit: with modified independence;with upper extremity assist PT Goal: Stand to Sit - Progress: Not met Pt will Transfer Bed to Chair/Chair to Bed: with modified independence PT Transfer Goal: Bed to Chair/Chair to Bed - Progress: Not met Pt will Ambulate: 51 - 150 feet;with modified independence;with rolling walker PT Goal: Ambulate - Progress: Progressing toward goal  Visit Information  Last PT Received On: 01/06/13 Assistance Needed: +2    Subjective Data  Subjective: My legs hurt at about a 9/10,  I know it's somewhat my back, but it was some cellulosis   Cognition  Cognition Overall Cognitive Status: Appears within functional limits for tasks assessed/performed Arousal/Alertness: Awake/alert Orientation Level: Appears intact for tasks assessed Behavior During Session: [facility] for tasks performed    Balance  Balance Balance Assessed: Yes Static Standing Balance Static Standing - Balance Support: During functional activity;Bilateral upper extremity supported Static Standing - Level of Assistance: 3: Mod assist  End of Session PT - End of Session Equipment Utilized During Treatment: Gait belt Activity Tolerance: Patient limited by pain;Patient limited by fatigue Patient left: Other (comment) (assist to stretcher for transport to [facility]) Nurse Communication: Mobility status   GP     [name], [name] 01/06/2013, 3:33 PM 01/06/2013  [place], PT [phone] (pager)

## 2013-01-06 NOTE — Progress Notes (Signed)
Pt d/c to [facility]/ Report called message left. D/c instructions and medications sent to [facility]

## 2013-01-06 NOTE — Progress Notes (Signed)
Patient seen and examined. Discharge was delayed 1 day as he had not yet met the 3 night qualifying stay for medicare. Will be going to SNF today. DC diagnoses and medications have not changed from DC Summary dictated 01/05/13.  Peggye Pitt, MD [facility] Pager: [phone]

## 2013-01-09 ENCOUNTER — Encounter: Payer: Self-pay | Admitting: Nurse Practitioner

## 2013-01-09 ENCOUNTER — Non-Acute Institutional Stay (SKILLED_NURSING_FACILITY): Payer: Medicare Other | Admitting: Nurse Practitioner

## 2013-01-09 DIAGNOSIS — J441 Chronic obstructive pulmonary disease with (acute) exacerbation: Secondary | ICD-10-CM

## 2013-01-09 DIAGNOSIS — I10 Essential (primary) hypertension: Secondary | ICD-10-CM

## 2013-01-09 DIAGNOSIS — L03119 Cellulitis of unspecified part of limb: Secondary | ICD-10-CM

## 2013-01-09 DIAGNOSIS — M069 Rheumatoid arthritis, unspecified: Secondary | ICD-10-CM

## 2013-01-09 DIAGNOSIS — L03115 Cellulitis of right lower limb: Secondary | ICD-10-CM

## 2013-01-09 DIAGNOSIS — D649 Anemia, unspecified: Secondary | ICD-10-CM

## 2013-01-09 DIAGNOSIS — R5381 Other malaise: Secondary | ICD-10-CM

## 2013-01-09 LAB — CULTURE, BLOOD (ROUTINE X 2): Culture: NO GROWTH

## 2013-01-09 NOTE — Clinical Social Work Placement (Addendum)
    Clinical Social Work Department CLINICAL SOCIAL WORK PLACEMENT NOTE 01/09/2013  Patient:  [name], [name]  Account Number:  [id number] Admit date:  01/03/2013  Clinical Social Worker:  Lupita Leash Cordale Manera, LCSWA  Date/time:  01/04/2013 06:00 PM  Clinical Social Work is seeking post-discharge placement for this patient at the following level of care:   SKILLED NURSING   (*CSW will update this form in Epic as items are completed)   01/04/2013  Patient/family provided with [facility] Department of Clinical Social Work's list of facilities offering this level of care within the geographic area requested by the patient (or if unable, by the patient's family).  01/04/2013  Patient/family informed of their freedom to choose among providers that offer the needed level of care, that participate in Medicare, Medicaid or managed care program needed by the patient, have an available bed and are willing to accept the patient.  01/04/2013  Patient/family informed of [facility]' ownership interest in [facility], as well as of the fact that they are under no obligation to receive care at this facility.  PASARR submitted to EDS on  PASARR number received from EDS on   FL2 transmitted to all facilities in geographic area requested by pt/family on 01/05/13   FL2 transmitted to all facilities within larger geographic area on   Patient informed that his/her managed care company has contracts with or will negotiate with  certain facilities, including the following:   Has existing PASARR     Patient/family informed of bed offers received: 01/05/13  Patient chooses bed at [facility] Physician recommends and patient chooses bed at    Patient to be transferred to [facility]  On 01/06/13   Patient to be transferred to facility by Ambulance  [facility])  The following physician request were entered in Epic:   Additional Comments: Patient and daughter were very disappointed that patient could not get a  bed at [facility] of [address].  Facility stated that they did not have a vacancy to accommodate patient.  Notified SNF and patient's nurse of d/c plan.  No further CSW needs identified. CSW signing off.  Lorri Frederick. [place]  [phone]

## 2013-01-09 NOTE — Clinical Social Work Psychosocial (Addendum)
    Clinical Social Work Department BRIEF PSYCHOSOCIAL ASSESSMENT 01/09/2013  Patient:  [name], [name]     Account Number:  [id number]     Admit date:  01/03/2013  Clinical Social Worker:  Tiburcio Pea  Date/Time:  01/04/2013 03:00 PM  Referred by:  Physician  Date Referred:  01/03/2013 Referred for  SNF Placement   Other Referral:   Interview type:  Patient Other interview type:    PSYCHOSOCIAL DATA Living Status:  ALONE Admitted from facility:   Level of care:   Primary support name:  [name]  [phone] Primary support relationship to patient:  CHILD, ADULT Degree of support available:   Strong support    CURRENT CONCERNS Current Concerns  Post-Acute Placement   Other Concerns:    SOCIAL WORK ASSESSMENT / PLAN CSW referred to assist this 79 year old male with short term SNF. He currently lives alone but has supportive daughters.  Patient has been having some intermittent confusion per nursing. CSW attempting to reach patien'ts daughter [name] regarding d/c disposition. Patient agrees to short term SNF. Fl2 placed on chart for MD's signature.   Assessment/plan status:  Psychosocial Support/Ongoing Assessment of Needs Other assessment/ plan:   Information/referral to community resources:   SNF bed list given to patient    PATIENT'S/FAMILY'S RESPONSE TO PLAN OF CARE: Patient agrees to short term SNF; wants his daughter [name] involved as well. Patient states that he has been a resident at [facility] in the past and he would like to return there if possible. CSW will talk to admissions at [facility] to see if this can be facilitated.

## 2013-01-09 NOTE — Progress Notes (Signed)
Patient ID: [name], male   DOB: 04/21/1928, 79 y.o.   MRN: [id number]  Chief Complaint: AV: follow up hospitalization   HPI:  Patient is a 79 year old man with a history of hypertension rheumatoid arthritis, recurrent UTIs, COPD, anemia who came into the hospital for generalized weakness that began a few days prior to admission, shortness of breath as well as bilateral lower extremity edema and erythema. Patient relates that he just recently was treated for urinary tract infection and cultures that grew Pseudomonas. He finished a treatment with Bactrim 2 days prior to admission. His lower extremity erythema is progressively getting worse with increased weakness because of these issues he was admitted to the hospital. During hospitalization he was treated for Acute on chronic kidney disease stage III (Cr 2.4 on admission and back to baseline around 1.6 upon discharge), bilateral lower extremity cellulitis (which has improved), COPD exacerbation (in which is steroids were increased and has now improved), and generalized weakness/adult failure to thrive in which the etiology was unclear but possibly related to his COPD exacerbation or lower extremity cellulitis as well as his history of rheumatoid arthritis. Physical therapy recommended short-term skilled nursing facility placement for rehabilitation purposes.  Therefore he was discharge to [facility] on Friday 01/06/13. Pt should have been discharged on the following medications: albuterol (2.5 MG/3ML) 0.083% nebulizer solution   Commonly known as: PROVENTIL   Take 2.5 mg by nebulization every 4 (four) hours as needed. For shortness of breath   alendronate 70 MG tablet   Commonly known as: FOSAMAX  Take 70 mg by mouth every 7 (seven) days. Take with a full glass of water on an empty stomach. On Friday   ALIGN PO   Take 1 tablet by mouth every morning.   aspirin EC 81 MG tablet   Take 81 mg by mouth every morning.   doxycycline 100 MG tablet    Commonly known as: VIBRA-TABS   Take 1 tablet (100 mg total) by mouth every 12 (twelve) hours.   flunisolide 29 MCG/ACT (0.025%) nasal spray   Commonly known as: NASAREL   Place 2 sprays into the nose 2 (two) times daily. Dose is for each nostril.   furosemide 40 MG tablet   Commonly known as: LASIX   Take 20 mg by mouth 2 (two) times daily.   gabapentin 100 MG capsule   Commonly known as: NEURONTIN   Take 200 mg by mouth 2 (two) times daily.   HALLS COUGH DROPS 9.1 MG Lozg   Generic drug: Menthol   Use as directed 1 lozenge in the mouth or throat as needed. For cough or sore throught   Hematinic Plus Vit/Minerals 106-1 MG Tabs   Take 1 tablet by mouth every morning.   leflunomide 20 MG tablet   Commonly known as: ARAVA   Take 10 mg by mouth every morning.   loperamide 2 MG capsule   Commonly known as: IMODIUM   Take 2 mg by mouth 4 (four) times daily as needed. For diarrhea   metoprolol 50 MG tablet   Commonly known as: LOPRESSOR   Take 25 mg by mouth 2 (two) times daily.   omeprazole 20 MG capsule   Commonly known as: PRILOSEC   Take 20 mg by mouth every morning.   predniSONE 10 MG tablet   Commonly known as: DELTASONE   Take 1 tablet (10 mg total) by mouth daily. Take 6 tablets today and decrease by 1 tablet daily;  then maintain at  home dose of 5 mg daily.   predniSONE 5 MG tablet   Commonly known as: DELTASONE   Take 5 mg by mouth daily.   QUEtiapine 25 MG tablet   Commonly known as: SEROQUEL   Take 25 mg by mouth at bedtime.   silver sulfADIAZINE 1 % cream   Commonly known as: SILVADENE   Apply 1 application topically daily. To legs and feet   SUPER B COMPLEX PO   Take 1 tablet by mouth every morning.   tamsulosin 0.4 MG Caps   Commonly known as: FLOMAX   Take 1 capsule (0.4 mg total) by mouth daily after supper.   traMADol 50 MG tablet   Commonly known as: ULTRAM   Take 1 tablet (50 mg total) by mouth every 6 (six) hours as needed. For pain   Vitamin D 2000  UNITS tablet   Take 2,000 Units by mouth every morning.   However This was the medication list sent over from the hospital.  ALBUTEROL (PROVENTIL) (2.5 MG/3ML) 0.083% NEBULIZER SOLUTION    Take 2.5 mg by nebulization every 4 (four) hours as needed. For shortness of breath   ASPIRIN EC 81 MG TABLET    Take 81 mg by mouth every morning.   BENZTROPINE (COGENTIN) 1 MG TABLET    Take 1 mg by mouth daily. BUDESONIDE-FORMOTEROL (SYMBICORT) 160-4.5 MCG/ACT INHALER    Inhale 2 puffs into the lungs 2 (two) times daily. CALCIUM CARBONATE (TUMS - DOSED IN MG ELEMENTAL CALCIUM) 500 MG CHEWABLE TABLET    Chew 1 tablet by mouth 2 (two) times daily. CLONIDINE (CATAPRES - DOSED IN MG/24 HR) 0.2 MG/24HR PATCH    Place 1 patch onto the skin once a week. DIVALPROEX (DEPAKOTE SPRINKLE) 125 MG CAPSULE    Take 125 mg by mouth 2 (two) times daily. Take 6 capsules (750mg ) BID NYSTATIN (MYCOSTATIN) 100000 UNIT/ML SUSPENSION    Take 500,000 Units by mouth 4 (four) times daily. For 14 days due to thrush OLANZAPINE (ZYPREXA) 10 MG TABLET    Take 5 mg by mouth 3 (three) times daily. SIMVASTATIN (ZOCOR) 10 MG TABLET    Take 10 mg by mouth at bedtime. TORSEMIDE (DEMADEX PO)    Take 80 mg by mouth 2 (two) times daily. It was discovered today that the wrong medications and information was sent from the hospital with this pts name on them.  So correct pt and MRN but wrong discharge summary  Pts daughter is currently at the bedside and has been informed of this. She does report the patient has been more loopy and confused than normal. Pt reports his pain has been worse than normal which I would expect since he has not been on his regular medications.  Pt denies any fevers, chills, shortness of breath, chest pain, redness and swelling in lower extremities.     Review of Systems:  Review of Systems  Constitutional: Positive for malaise/fatigue. Negative for fever and chills.  Eyes: Negative for blurred vision.  Respiratory:  Negative for cough, shortness of breath and wheezing.   Cardiovascular: Negative for chest pain, palpitations and leg swelling.  Gastrointestinal: Positive for diarrhea. Negative for heartburn, nausea, vomiting, abdominal pain and constipation.  Genitourinary: Positive for frequency. Negative for dysuria and hematuria.  Musculoskeletal: Positive for myalgias and joint pain.  Skin: Negative.  Negative for itching and rash.       No erythema or edema to lower extermities  Neurological: Positive for weakness. Negative for dizziness, tremors and headaches.  Psychiatric/Behavioral: Negative for depression. The patient is not nervous/anxious and does not have insomnia.    Past Medical History  Diagnosis Date  . Hypertension   . Arthritis   . Ex-smoker   . Rheumatoid arthritis   . Pulmonary nodule   . DDD (degenerative disc disease)   . Complication of anesthesia     " I am difficult to wake up "  . Chronic kidney disease     acute on chronic   Past Surgical History  Procedure Laterality Date  . Joint replacement      bilateral knee replacement  . Hernia repair    . Back surgery      X 2  . Appendectomy     Family History  Problem Relation Age of Onset  . Coronary artery disease Brother   . Hypertension Father   . Hypertension Mother    History  Substance Use Topics  . Smoking status: Former Smoker -- 0.50 packs/day for 30 years    Types: Cigarettes    Quit date: 10/12/1973  . Smokeless tobacco: Never Used  . Alcohol Use: No      Physical Exam:  Filed Vitals:   01/09/13 1636  BP: 133/74  Pulse: 80  Temp: 97.9 F (36.6 C)  Resp: 20   Physical Exam  Nursing note and vitals reviewed. Constitutional: He is oriented to person, place, and time and well-developed, well-nourished, and in no distress. No distress.  HENT:  Head: Normocephalic and atraumatic.  Eyes: Conjunctivae and EOM are normal. Pupils are equal, round, and reactive to light.  Neck: Normal range of  motion. Neck supple.  Cardiovascular: Normal rate, regular rhythm and normal heart sounds.   Pulmonary/Chest: Effort normal and breath sounds normal.  Abdominal: Soft. Bowel sounds are normal.  Musculoskeletal: Normal range of motion. He exhibits no edema and no tenderness.  No edema or erythema to lower extremities   Neurological: He is alert and oriented to person, place, and time.  Skin: Skin is warm and dry. He is not diaphoretic.  Psychiatric:  Pt is sleepy but responds appropriately to question        Labs reviewed: Basic Metabolic Panel:  Recent Labs  04/54/09 0355  06/12/12 0432  01/03/13 0507 01/03/13 0515 01/03/13 1115 01/04/13 0505 01/05/13 0455  NA 140  < > 140  < > 141 139  --  140 145  K 4.1  < > 3.9  < > 3.1* 3.2*  --  4.4 3.9  CL 108  < > 106  < > 94* 97  --  102 108  CO2 20  < > 25  < > 35*  --   --  30 31  GLUCOSE 88  < > 93  < > 107* 109*  --  133* 100*  BUN 26*  < > 31*  < > 52* 53*  --  57* 47*  CREATININE 1.85*  < > 1.99*  < > 2.45* 2.50* 2.44* 2.22* 1.63*  CALCIUM 8.3*  < > 8.6  < > 10.3  --   --  9.3 9.0  MG 1.2*  --  1.1*  --   --   --   --   --   --   < > = values in this interval not displayed.  Liver Function Tests:  Recent Labs  06/04/12 2040 01/03/13 0507 01/04/13 0505  AST 30 21 15   ALT 23 14 12   ALKPHOS 114 97 80  BILITOT 0.4 0.3  0.2*  PROT 6.3 6.7 5.7*  ALBUMIN 2.3* 2.9* 2.4*    CBC:  Recent Labs  03/06/12 1352 05/08/12 1638 06/04/12 2040  01/03/13 1115 01/04/13 0505 01/05/13 0455  WBC 8.4 9.1 12.5*  < > 6.8 8.0 8.3  NEUTROABS 6.4 6.8 10.3*  --   --   --   --   HGB 13.6 11.8* 9.7*  < > 11.3* 10.8* 10.8*  HCT 40.7 37.2* 29.6*  < > 34.2* 32.3* 33.0*  MCV 87.5 90.3 87.1  < > 89.1 88.3 89.2  PLT 205 263 328  < > 199 204 200  < > = values in this interval not displayed.   Assessment/Plan  HYPERTENSION  Rheumatoid arthritis  Bilateral cellulitis of lower leg  Anemia  Tachycardia  Leukocytosis  Physical  deconditioning  Dyspnea  COPD exacerbation  Dysuria  CKD (chronic kidney disease) stage 3, GFR 30-59 ml/min    previous medications were all discontinued and incorrect discharge summary removed from the chart. medications orders as below and correct discharge summary given to facility.  albuterol (2.5 MG/3ML) 0.083% nebulizer solution   Commonly known as: PROVENTIL   Take 2.5 mg by nebulization every 4 (four) hours as needed. For shortness of breath   alendronate 70 MG tablet   Commonly known as: FOSAMAX  Take 70 mg by mouth every 7 (seven) days. Take with a full glass of water on an empty stomach. On Friday   ALIGN PO   Take 1 tablet by mouth every morning.   aspirin EC 81 MG tablet   Take 81 mg by mouth every morning.   doxycycline 100 MG tablet   Commonly known as: VIBRA-TABS   Take 1 tablet (100 mg total) by mouth every 12 (twelve) hours.   flunisolide 29 MCG/ACT (0.025%) nasal spray   Commonly known as: NASAREL   Place 2 sprays into the nose 2 (two) times daily. Dose is for each nostril.   furosemide 40 MG tablet   Commonly known as: LASIX   Take 20 mg by mouth 2 (two) times daily.   gabapentin 100 MG capsule   Commonly known as: NEURONTIN   Take 200 mg by mouth 2 (two) times daily.   HALLS COUGH DROPS 9.1 MG Lozg   Generic drug: Menthol   Use as directed 1 lozenge in the mouth or throat as needed. For cough or sore throught   Hematinic Plus Vit/Minerals 106-1 MG Tabs   Take 1 tablet by mouth every morning.   leflunomide 20 MG tablet   Commonly known as: ARAVA   Take 10 mg by mouth every morning.   loperamide 2 MG capsule   Commonly known as: IMODIUM   Take 2 mg by mouth 4 (four) times daily as needed. For diarrhea   metoprolol 50 MG tablet   Commonly known as: LOPRESSOR   Take 25 mg by mouth 2 (two) times daily.   omeprazole 20 MG capsule   Commonly known as: PRILOSEC   Take 20 mg by mouth every morning.   predniSONE 10 MG tablet   Commonly known as: DELTASONE    Take 1 tablet (10 mg total) by mouth daily. Take 6 tablets today and decrease by 1 tablet daily;  then maintain at home dose of 5 mg daily.   predniSONE 5 MG tablet   Commonly known as: DELTASONE   Take 5 mg by mouth daily.   QUEtiapine 25 MG tablet   Commonly known as: SEROQUEL   Take 25  mg by mouth at bedtime.   silver sulfADIAZINE 1 % cream   Commonly known as: SILVADENE   Apply 1 application topically daily. To legs and feet   SUPER B COMPLEX PO   Take 1 tablet by mouth every morning.   tamsulosin 0.4 MG Caps   Commonly known as: FLOMAX   Take 1 capsule (0.4 mg total) by mouth daily after supper.   traMADol 50 MG tablet   Commonly known as: ULTRAM   Take 1 tablet (50 mg total) by mouth every 6 (six) hours as needed. For pain   Vitamin D 2000 UNITS tablet   Take 2,000 Units by mouth every morning.   Director of Nursing and Wellsite geologist notified all medication pulled from drawer. CBC, BMP and EKG ordered stat. Will have nursing do AIMS test weekly times 2.   Cellulitis has resolved so will dc doxycyline.

## 2013-01-10 ENCOUNTER — Other Ambulatory Visit: Payer: Self-pay | Admitting: Nurse Practitioner

## 2013-01-10 MED ORDER — PREDNISONE 10 MG PO TABS: 5.0000 mg | ORAL_TABLET | Freq: Every day | ORAL | Status: DC

## 2013-01-10 MED ORDER — LEFLUNOMIDE 20 MG PO TABS: 10.0000 mg | ORAL_TABLET | Freq: Every morning | ORAL | Status: DC

## 2013-01-12 ENCOUNTER — Non-Acute Institutional Stay (SKILLED_NURSING_FACILITY): Payer: Medicare Other | Admitting: Internal Medicine

## 2013-01-12 ENCOUNTER — Encounter: Payer: Self-pay | Admitting: Internal Medicine

## 2013-01-12 DIAGNOSIS — R5381 Other malaise: Secondary | ICD-10-CM

## 2013-01-12 DIAGNOSIS — D649 Anemia, unspecified: Secondary | ICD-10-CM

## 2013-01-12 DIAGNOSIS — J449 Chronic obstructive pulmonary disease, unspecified: Secondary | ICD-10-CM

## 2013-01-12 NOTE — Progress Notes (Signed)
Patient ID: [name], male   DOB: Jan 17, 1928, 79 y.o.   MRN: [id number]   [name] WUJ:[id number] DOB: 1928-03-19 DOA: (Not on file)    Patient seen on 01/11/2013  PCP: Jon Patch, MD   Chief Complaint: post hospitalization follow up in SNF  HPI:  It to 79-year-old male with history of hypertension, rheumatoid arthritis, COPD CKD stage 3 who was admitted to was: Hospital from 4/8- 4/10 for shortness of breath, generalized weakness and bilateral lower extremity cellulitis. Patient had a generalized weakness likely secondary to COPD exacerbation and lower extremity cellulitis. He was started on doxycycline and his symptoms improved. For his COPD he was started on steroids and nebulizers with improvement in symptoms. Patient was seen by physical therapy for generalized weakness and failure to thrive and recommended for short-term skilled nursing facility for strength training and transfers. Patient on evaluation today denies any symptoms including headache, bloody vision, dizziness, fever, chills, shortness of breath, cough, chest pain, palpitations, abdominal pain, nausea, vomiting, bowel or urinary symptoms. He does have generalized weakness but feels to be improving. He informs using a walker prior to hospitalization and was living with his daughter.  Review of Systems:  Constitutional: Denies fever, chills, diaphoresis, appetite change complains of fatigue HEENT : Denies  photophobia, eye pain, redness, hearing loss, ear pain, congestion, sore throat, rhinorrhea, sneezing, mouth sores, trouble swallowing, neck pain, neck stiffness and tinnitus.   Respiratory: Denies SOB, DOE, cough, chest tightness,  and wheezing.   Cardiovascular: Denies chest pain, palpitations and leg swelling.  Gastrointestinal: Denies nausea, vomiting, abdominal pain, diarrhea, constipation, blood in stool and abdominal distention.  Genitourinary: Denies dysuria, urgency, frequency, hematuria, flank pain and  difficulty urinating.  Musculoskeletal: Denies myalgias, back pain, joint swelling, arthralgias and gait problem.  Skin: Denies pallor, rash and wound.  Neurological: Denies dizziness, seizures, syncope, weakness, light-headedness, numbness and headaches.  Hematological: Denies adenopathy. Easy bruising, personal or family bleeding history  Psychiatric/Behavioral: Denies suicidal ideation, mood changes, confusion, nervousness, sleep disturbance and agitation   Past Medical History  Diagnosis Date  . Hypertension   . Arthritis   . Ex-smoker   . Rheumatoid arthritis   . Pulmonary nodule   . DDD (degenerative disc disease)   . Complication of anesthesia     " I am difficult to wake up "  . Chronic kidney disease     acute on chronic   Past Surgical History  Procedure Laterality Date  . Joint replacement      bilateral knee replacement  . Hernia repair    . Back surgery      X 2  . Appendectomy     Social History:  reports that he quit smoking about 39 years ago. His smoking use included Cigarettes. He has a 15 pack-year smoking history. He has never used smokeless tobacco. He reports that he does not drink alcohol or use illicit drugs.  Allergies  Allergen Reactions  . Codeine Other (See Comments)     drives me crazy  . Hydrocodone Other (See Comments)    Makes patient hallucinate, cannot sleep  . Oxycodone Other (See Comments)    Makes patient hallucinate, cannot sleep  . Penicillins Hives  . Sulfa Antibiotics     Family History  Problem Relation Age of Onset  . Coronary artery disease Brother   . Hypertension Father   . Hypertension Mother     Prior to Admission medications   Medication Sig Start Date End Date Taking? Authorizing  Provider  albuterol (PROVENTIL) (2.5 MG/3ML) 0.083% nebulizer solution Take 2.5 mg by nebulization every 4 (four) hours as needed. For shortness of breath    Historical Provider, MD  alendronate (FOSAMAX) 70 MG tablet Take 70 mg by mouth  every 7 (seven) days. Take with a full glass of water on an empty stomach.  On Friday    Historical Provider, MD  aspirin EC 81 MG tablet Take 81 mg by mouth every morning.     Historical Provider, MD  Cholecalciferol (VITAMIN D) 2000 UNITS tablet Take 2,000 Units by mouth every morning.     Historical Provider, MD  flunisolide (NASAREL) 29 MCG/ACT (0.025%) nasal spray Place 2 sprays into the nose 2 (two) times daily. Dose is for each nostril.    Historical Provider, MD  gabapentin (NEURONTIN) 100 MG capsule Take 200 mg by mouth 2 (two) times daily.    Historical Provider, MD  leflunomide (ARAVA) 20 MG tablet Take 0.5 tablets (10 mg total) by mouth every morning. 01/10/13   Claudie Revering, NP  metoprolol (LOPRESSOR) 50 MG tablet Take 25 mg by mouth 2 (two) times daily.    Historical Provider, MD  omeprazole (PRILOSEC) 20 MG capsule Take 20 mg by mouth every morning.     Historical Provider, MD  predniSONE (DELTASONE) 10 MG tablet Take 0.5 tablets (5 mg total) by mouth daily. 01/10/13   Claudie Revering, NP  Probiotic Product (ALIGN PO) Take 1 tablet by mouth every morning.    Historical Provider, MD  QUEtiapine (SEROQUEL) 25 MG tablet Take 25 mg by mouth at bedtime.    Historical Provider, MD  silver sulfADIAZINE (SILVADENE) 1 % cream Apply 1 application topically daily. To legs and feet    Historical Provider, MD  Tamsulosin HCl (FLOMAX) 0.4 MG CAPS Take 1 capsule (0.4 mg total) by mouth daily after supper. 06/13/12   Vassie Loll, MD  traMADol (ULTRAM) 50 MG tablet Take 1 tablet (50 mg total) by mouth every 6 (six) hours as needed. For pain 06/13/12   Vassie Loll, MD    Physical Exam:  Filed Vitals:   01/11/13 1100  BP: 120/62  Pulse: 90  Temp: 97 F (36.1 C)  Resp: 20    Constitutional: Vital signs reviewed.  Patient is an elderly male lying in bed in no acute distress.  Head: Normocephalic and atraumatic Mouth: no erythema or exudates, MMM Eyes: PERRL, EOMI, conjunctivae normal, No  scleral icterus.  Neck: Supple, Trachea midline normal ROM, No JVD, mass, thyromegaly Cardiovascular: RRR, S1 normal, S2 normal, no MRG, pulses symmetric and intact bilaterally Pulmonary/Chest: CTAB, no wheezes, rales, or rhonchi Abdominal: Soft. Non-tender, non-distended, bowel sounds are normal, no masses, organomegaly, or guarding present.  GU: no CVA tenderness Musculoskeletal: No joint deformities, erythema, or stiffness, ROM full and no nontender Ext: no edema and no cyanosis, pulses palpable bilaterally  Hematology: no cervical adenopathy.  Neurological: A&O x3, normal motor and sensory function. Skin: Warm, dry and intact. No rash, cyanosis, or clubbing.  Psychiatric: Normal mood and affect. speech and behavior is normal. Judgment and thought content normal. Cognition and memory are normal.   Labs : Recent labs from the hospital is reviewed.     Assessment/Plan   generalized weakness with failure to thrive Possibly in the setting of bilateral lower extremity cellulitis and underlying COPD. Patient discharged on a course of oral doxycycline and he is currently on it.  cellulitis have now resolved clinically. Patient has started to participate with physical  therapy and encouraged to do so  COPD Was treated for acute exacerbation in the hospital and discharged on a short course of oral prednisone. He is clinically stable at this time without any symptoms.  CKD stage III Patient had acute on chronic kidney disease in the hospital with creatinine of 2.4 . This is possibly in the setting of some dehydration and improved upon discharge to 1.6.  History of BPH Continue daily Flomax  Rheumatoid arthritis Symptoms are stable. Continue Leflunamide. Continue tramadol for pain  Hypertension Continue metoprolol  GERD Continue omeprazole    activities of daily living Feeding independently Dressing independently Need some assistance with toileting and bathing Needs assistance  with transfer and ambulation  Rehabilitation potential: Good  Labs: No labs needed at this time  follow up: Patient will be followed up as needed  Counseling: Patient counseled on medications and dietary adherence  Code Status: full code Family Communication:  none    total time spent: 60 minutes    [name]  01/12/2013, 10:01 AM

## 2013-02-06 ENCOUNTER — Non-Acute Institutional Stay (SKILLED_NURSING_FACILITY): Payer: Medicare Other | Admitting: Nurse Practitioner

## 2013-02-06 ENCOUNTER — Encounter: Payer: Self-pay | Admitting: Nurse Practitioner

## 2013-02-06 DIAGNOSIS — D649 Anemia, unspecified: Secondary | ICD-10-CM

## 2013-02-06 DIAGNOSIS — J449 Chronic obstructive pulmonary disease, unspecified: Secondary | ICD-10-CM

## 2013-02-06 DIAGNOSIS — N390 Urinary tract infection, site not specified: Secondary | ICD-10-CM | POA: Insufficient documentation

## 2013-02-06 DIAGNOSIS — I10 Essential (primary) hypertension: Secondary | ICD-10-CM

## 2013-02-06 NOTE — Assessment & Plan Note (Signed)
Patient is stable; continue current regimen. Will monitor and make changes as necessary.  

## 2013-02-06 NOTE — Assessment & Plan Note (Signed)
Recently been treated with gentamycin now on trimethoprim 100 mg daily

## 2013-02-06 NOTE — Assessment & Plan Note (Signed)
Patients hypertension is stable; continue current regimen. Will monitor and make changes as necessary.  

## 2013-02-06 NOTE — Progress Notes (Signed)
Patient ID: [name], male   DOB: December 10, 1927, 79 y.o.   MRN: [id number]  Chief Complaint: medical management of chronic conditions   HPI:  Patient is a 79 year old man with a history of hypertension rheumatoid arthritis, recurrent UTIs, COPD, anemia who is at [facility] for STR is seen today for routine follow up. Pt reported he has cough and some nasal congestion 2 days ago that has progressively been improving. Denies shortness of breath, fevers, chills, chest pain or signification cough or congestion.  Review of Systems:  Review of Systems  Constitutional: Negative for fever, chills and malaise/fatigue.  HENT: Negative for nosebleeds, congestion and sore throat.   Respiratory: Positive for cough. Negative for sputum production and shortness of breath.   Cardiovascular: Negative for chest pain, palpitations and leg swelling.  Gastrointestinal: Negative for abdominal pain, diarrhea and constipation.  Genitourinary: Negative for dysuria, urgency and frequency.  Musculoskeletal: Negative for myalgias.  Skin: Negative.   Neurological: Negative for weakness and headaches.  Psychiatric/Behavioral: Negative for depression. The patient does not have insomnia.      Medications: Patient's Medications  New Prescriptions   No medications on file  Previous Medications   ALBUTEROL (PROVENTIL) (2.5 MG/3ML) 0.083% NEBULIZER SOLUTION    Take 2.5 mg by nebulization every 4 (four) hours as needed. For shortness of breath   ALENDRONATE (FOSAMAX) 70 MG TABLET    Take 70 mg by mouth every 7 (seven) days. Take with a full glass of water on an empty stomach.  On Friday   ASPIRIN EC 81 MG TABLET    Take 81 mg by mouth every morning.    CHOLECALCIFEROL (VITAMIN D) 2000 UNITS TABLET    Take 2,000 Units by mouth every morning.    ESCITALOPRAM (LEXAPRO) 10 MG TABLET    Take 10 mg by mouth daily.   FLUNISOLIDE (NASAREL) 29 MCG/ACT (0.025%) NASAL SPRAY    Place 2 sprays into the nose 2 (two) times daily. Dose  is for each nostril.   GABAPENTIN (NEURONTIN) 100 MG CAPSULE    Take 200 mg by mouth 2 (two) times daily.   LEFLUNOMIDE (ARAVA) 20 MG TABLET    Take 0.5 tablets (10 mg total) by mouth every morning.   METOPROLOL (LOPRESSOR) 50 MG TABLET    Take 25 mg by mouth 2 (two) times daily.   OMEPRAZOLE (PRILOSEC) 20 MG CAPSULE    Take 20 mg by mouth every morning.    PREDNISONE (DELTASONE) 10 MG TABLET    Take 0.5 tablets (5 mg total) by mouth daily.   PROBIOTIC PRODUCT (ALIGN PO)    Take 1 tablet by mouth every morning.   SILVER SULFADIAZINE (SILVADENE) 1 % CREAM    Apply 1 application topically daily. To legs and feet   TAMSULOSIN HCL (FLOMAX) 0.4 MG CAPS    Take 1 capsule (0.4 mg total) by mouth daily after supper.   TRAMADOL (ULTRAM) 50 MG TABLET    Take 1 tablet (50 mg total) by mouth every 6 (six) hours as needed. For pain   TRIMIPRAMINE (SURMONTIL) 100 MG CAPSULE    Take 100 mg by mouth at bedtime.  Modified Medications   No medications on file  Discontinued Medications   QUETIAPINE (SEROQUEL) 25 MG TABLET    Take 25 mg by mouth at bedtime.     Physical Exam: Physical Exam  Constitutional: He is oriented to person, place, and time. No distress.  HENT:  Head: Normocephalic and atraumatic.  Nose: Nose normal.  Mouth/Throat:  Oropharynx is clear and moist. No oropharyngeal exudate.  Neck: Normal range of motion. Neck supple.  Cardiovascular: Normal rate and normal heart sounds.   Pulmonary/Chest: Effort normal and breath sounds normal. No respiratory distress. He has no wheezes.  Abdominal: Soft. Bowel sounds are normal.  Musculoskeletal: He exhibits no edema and no tenderness.  Self propels in wc  Lymphadenopathy:    He has no cervical adenopathy.  Neurological: He is alert and oriented to person, place, and time.  Skin: Skin is warm and dry. He is not diaphoretic.     Filed Vitals:   02/06/13 1635  BP: 129/78  Pulse: 74  Temp: 97.1 F (36.2 C)  Resp: 20  Weight: 168 lb 6.4 oz  (76.386 kg)  SpO2: 93%      Labs reviewed: Basic Metabolic Panel:  Recent Labs  54/09/81 0355  06/12/12 0432  01/03/13 0507 01/03/13 0515 01/03/13 1115 01/04/13 0505 01/05/13 0455  NA 140  < > 140  < > 141 139  --  140 145  K 4.1  < > 3.9  < > 3.1* 3.2*  --  4.4 3.9  CL 108  < > 106  < > 94* 97  --  102 108  CO2 20  < > 25  < > 35*  --   --  30 31  GLUCOSE 88  < > 93  < > 107* 109*  --  133* 100*  BUN 26*  < > 31*  < > 52* 53*  --  57* 47*  CREATININE 1.85*  < > 1.99*  < > 2.45* 2.50* 2.44* 2.22* 1.63*  CALCIUM 8.3*  < > 8.6  < > 10.3  --   --  9.3 9.0  MG 1.2*  --  1.1*  --   --   --   --   --   --   < > = values in this interval not displayed.  Liver Function Tests:  Recent Labs  06/04/12 2040 01/03/13 0507 01/04/13 0505  AST 30 21 15   ALT 23 14 12   ALKPHOS 114 97 80  BILITOT 0.4 0.3 0.2*  PROT 6.3 6.7 5.7*  ALBUMIN 2.3* 2.9* 2.4*    CBC:  Recent Labs  03/06/12 1352 05/08/12 1638 06/04/12 2040  01/03/13 1115 01/04/13 0505 01/05/13 0455  WBC 8.4 9.1 12.5*  < > 6.8 8.0 8.3  NEUTROABS 6.4 6.8 10.3*  --   --   --   --   HGB 13.6 11.8* 9.7*  < > 11.3* 10.8* 10.8*  HCT 40.7 37.2* 29.6*  < > 34.2* 32.3* 33.0*  MCV 87.5 90.3 87.1  < > 89.1 88.3 89.2  PLT 205 263 328  < > 199 204 200  < > = values in this interval not displayed.  01/11/13  WBC  9.4        4.0-10.5  K/uL  WML       RBC  4.05        3.87-5.11  MIL/uL  WML       Hemoglobin  12.0        12.0-15.0  g/dL  WML       Hematocrit  36.0        36.0-46.0  %  WML       MCV  89.0        78.0-100.0  fL  WML       MCH  29.6        26.0-34.0  pg  WML       MCHC  33.5        30.0-36.0  g/dL  WML       RDW  16.1        11.5-15.5  %  WML       Platelet Count  154        150-400  K/uL     Sodium  145        135-145  mEq/L  SLN       Potassium  3.9        3.5-5.3  mEq/L  SLN       Chloride  107        96-112  mEq/L  SLN       CO2  29        19-32  mEq/L  SLN       Glucose  116     H  70-99  mg/dL  SLN        BUN  43     H  6-23  mg/dL  SLN       Creatinine  1.84     H  0.50-1.35  mg/dL  SLN       Calcium  9.3        8.4-10.5  mg/dL  SLN    0/96/04 Basic Metabolic Panel       Sodium  141        135-145  mEq/L  SLN       Potassium  4.5        3.5-5.3  mEq/L  SLN       Chloride  98        96-112  mEq/L  SLN       CO2  29        19-32  mEq/L  SLN       Glucose  92        70-99  mg/dL  SLN       BUN  47     H  6-23  mg/dL  SLN       Creatinine  1.76     H  0.50-1.35  mg/dL  SLN       Calcium  9.8        8.4-10.5  mg/dL  SLN      TSH, Ultrasensitive             TSH  2.440         Assessment/Plan CKD (chronic kidney disease) stage 3, GFR 30-59 ml/min Currently being followed by nephology   UTI (urinary tract infection) Recently been treated with gentamycin now on trimethoprim 100 mg daily   Anemia Patient is stable; continue current regimen. Will monitor and make changes as necessary.   COPD (chronic obstructive pulmonary disease) Patient is stable; continue current regimen. Will monitor and make changes as necessary.   HYPERTENSION Patients hypertension is stable; continue current regimen. Will monitor and make changes as necessary.

## 2013-02-06 NOTE — Assessment & Plan Note (Signed)
Currently being followed by nephology

## 2013-02-13 ENCOUNTER — Non-Acute Institutional Stay (SKILLED_NURSING_FACILITY): Payer: Medicare Other | Admitting: Nurse Practitioner

## 2013-02-13 ENCOUNTER — Encounter: Payer: Self-pay | Admitting: Nurse Practitioner

## 2013-02-13 DIAGNOSIS — J441 Chronic obstructive pulmonary disease with (acute) exacerbation: Secondary | ICD-10-CM

## 2013-02-13 NOTE — Progress Notes (Signed)
Patient ID: [name], male   DOB: [birth year], 79 y.o.   MRN: [id number]  Nursing Home Location:  [facility]   Place of Service: SNF (31)   Chief Complaint: AV: cough and congestion  HPI:  Pt seen today due to worsening cough and congestion. Pt with history of smoking and COPD. Staff reports he was asking for breathing treatments due to not breathing good. Oxygen was applied to help with shortness of breath and desaturation on room air. Has productive cough. No chest pain or fevers or chills Pt reports he has no appetite or energy at this time.   Review of Systems:  Review of Systems  Constitutional: Positive for malaise/fatigue. Negative for fever and chills.  Respiratory: Positive for cough, sputum production and shortness of breath. Negative for wheezing.   Cardiovascular: Negative for chest pain, palpitations and leg swelling.  Gastrointestinal: Negative for abdominal pain, diarrhea and constipation.  Genitourinary: Negative for dysuria, urgency and frequency.  Musculoskeletal: Positive for myalgias.  Neurological: Positive for weakness.     Medications: Patient's Medications  New Prescriptions   No medications on file  Previous Medications   ALBUTEROL (PROVENTIL) (2.5 MG/3ML) 0.083% NEBULIZER SOLUTION    Take 2.5 mg by nebulization every 4 (four) hours as needed. For shortness of breath   ALENDRONATE (FOSAMAX) 70 MG TABLET    Take 70 mg by mouth every 7 (seven) days. Take with a full glass of water on an empty stomach.  On Friday   ASPIRIN EC 81 MG TABLET    Take 81 mg by mouth every morning.    CHOLECALCIFEROL (VITAMIN D) 2000 UNITS TABLET    Take 2,000 Units by mouth every morning.    ESCITALOPRAM (LEXAPRO) 10 MG TABLET    Take 10 mg by mouth daily.   FLUNISOLIDE (NASAREL) 29 MCG/ACT (0.025%) NASAL SPRAY    Place 2 sprays into the nose 2 (two) times daily. Dose is for each nostril.   GABAPENTIN (NEURONTIN) 100 MG CAPSULE    Take 200 mg by mouth 2 (two)  times daily.   LEFLUNOMIDE (ARAVA) 20 MG TABLET    Take 0.5 tablets (10 mg total) by mouth every morning.   METOPROLOL (LOPRESSOR) 50 MG TABLET    Take 25 mg by mouth 2 (two) times daily.   OMEPRAZOLE (PRILOSEC) 20 MG CAPSULE    Take 20 mg by mouth every morning.    PREDNISONE (DELTASONE) 10 MG TABLET    Take 0.5 tablets (5 mg total) by mouth daily.   PROBIOTIC PRODUCT (ALIGN PO)    Take 1 tablet by mouth every morning.   SILVER SULFADIAZINE (SILVADENE) 1 % CREAM    Apply 1 application topically daily. To legs and feet   TAMSULOSIN HCL (FLOMAX) 0.4 MG CAPS    Take 1 capsule (0.4 mg total) by mouth daily after supper.   TRAMADOL (ULTRAM) 50 MG TABLET    Take 1 tablet (50 mg total) by mouth every 6 (six) hours as needed. For pain   TRIMIPRAMINE (SURMONTIL) 100 MG CAPSULE    Take 100 mg by mouth at bedtime.  Modified Medications   No medications on file  Discontinued Medications   No medications on file     Physical Exam:  Filed Vitals:   02/13/13 1810  BP: 136/87  Pulse: 72  Temp: 96.9 F (36.1 C)  Resp: 20  SpO2: 95%    Physical Exam  Constitutional: He appears well-developed and well-nourished. No distress.  Cardiovascular: Normal  rate, regular rhythm and normal heart sounds.   Pulmonary/Chest: Effort normal. No respiratory distress. He has wheezes (left upper lobe). He has rales (chest congestion throughout).  Abdominal: Soft. Bowel sounds are normal.  Skin: Skin is warm and dry. He is not diaphoretic.     Assessment/Plan  COPD-now with increased shortness of breath; cough and congestion Will get chest xray; duonebs q 6 for 1 weeks mucinex DM 1 tablet twice daily Avelox 400mg  PO daily for 7 days

## 2013-02-21 ENCOUNTER — Other Ambulatory Visit: Payer: Self-pay | Admitting: Dermatology

## 2013-02-26 DIAGNOSIS — A4159 Other Gram-negative sepsis: Secondary | ICD-10-CM

## 2013-02-26 DIAGNOSIS — G9341 Metabolic encephalopathy: Secondary | ICD-10-CM

## 2013-02-26 HISTORY — DX: Metabolic encephalopathy: G93.41

## 2013-02-26 HISTORY — DX: Other gram-negative sepsis: A41.59

## 2013-02-27 ENCOUNTER — Emergency Department (HOSPITAL_COMMUNITY): Payer: Medicare Other

## 2013-02-27 ENCOUNTER — Inpatient Hospital Stay (HOSPITAL_COMMUNITY): Payer: Medicare Other

## 2013-02-27 ENCOUNTER — Inpatient Hospital Stay (HOSPITAL_COMMUNITY)
Admission: EM | Admit: 2013-02-27 | Discharge: 2013-03-02 | DRG: 871 | Disposition: A | Discharge status: Skilled Nursing Facility | Payer: Medicare Other | Attending: Internal Medicine | Admitting: Internal Medicine

## 2013-02-27 ENCOUNTER — Encounter (HOSPITAL_COMMUNITY): Payer: Self-pay | Admitting: *Deleted

## 2013-02-27 DIAGNOSIS — R4182 Altered mental status, unspecified: Secondary | ICD-10-CM

## 2013-02-27 DIAGNOSIS — N179 Acute kidney failure, unspecified: Secondary | ICD-10-CM

## 2013-02-27 DIAGNOSIS — Z8249 Family history of ischemic heart disease and other diseases of the circulatory system: Secondary | ICD-10-CM

## 2013-02-27 DIAGNOSIS — G9340 Encephalopathy, unspecified: Secondary | ICD-10-CM

## 2013-02-27 DIAGNOSIS — A419 Sepsis, unspecified organism: Secondary | ICD-10-CM | POA: Diagnosis present

## 2013-02-27 DIAGNOSIS — Z79899 Other long term (current) drug therapy: Secondary | ICD-10-CM

## 2013-02-27 DIAGNOSIS — N390 Urinary tract infection, site not specified: Secondary | ICD-10-CM

## 2013-02-27 DIAGNOSIS — Z87440 Personal history of urinary (tract) infections: Secondary | ICD-10-CM

## 2013-02-27 DIAGNOSIS — Z880 Allergy status to penicillin: Secondary | ICD-10-CM

## 2013-02-27 DIAGNOSIS — N183 Chronic kidney disease, stage 3 unspecified: Secondary | ICD-10-CM

## 2013-02-27 DIAGNOSIS — Z87891 Personal history of nicotine dependence: Secondary | ICD-10-CM

## 2013-02-27 DIAGNOSIS — A4159 Other Gram-negative sepsis: Principal | ICD-10-CM

## 2013-02-27 DIAGNOSIS — J4489 Other specified chronic obstructive pulmonary disease: Secondary | ICD-10-CM | POA: Diagnosis present

## 2013-02-27 DIAGNOSIS — J449 Chronic obstructive pulmonary disease, unspecified: Secondary | ICD-10-CM | POA: Diagnosis present

## 2013-02-27 DIAGNOSIS — R6520 Severe sepsis without septic shock: Secondary | ICD-10-CM | POA: Diagnosis present

## 2013-02-27 DIAGNOSIS — R300 Dysuria: Secondary | ICD-10-CM

## 2013-02-27 DIAGNOSIS — D72829 Elevated white blood cell count, unspecified: Secondary | ICD-10-CM

## 2013-02-27 DIAGNOSIS — G9341 Metabolic encephalopathy: Secondary | ICD-10-CM | POA: Diagnosis present

## 2013-02-27 DIAGNOSIS — Z8701 Personal history of pneumonia (recurrent): Secondary | ICD-10-CM

## 2013-02-27 DIAGNOSIS — D649 Anemia, unspecified: Secondary | ICD-10-CM

## 2013-02-27 DIAGNOSIS — M069 Rheumatoid arthritis, unspecified: Secondary | ICD-10-CM | POA: Diagnosis present

## 2013-02-27 DIAGNOSIS — Z96659 Presence of unspecified artificial knee joint: Secondary | ICD-10-CM

## 2013-02-27 DIAGNOSIS — I129 Hypertensive chronic kidney disease with stage 1 through stage 4 chronic kidney disease, or unspecified chronic kidney disease: Secondary | ICD-10-CM | POA: Diagnosis present

## 2013-02-27 DIAGNOSIS — IMO0002 Reserved for concepts with insufficient information to code with codable children: Secondary | ICD-10-CM | POA: Diagnosis present

## 2013-02-27 HISTORY — DX: Anemia, unspecified: D64.9

## 2013-02-27 LAB — COMPREHENSIVE METABOLIC PANEL
ALT: 13 U/L (ref 0–53)
AST: 19 U/L (ref 0–37)
Alkaline Phosphatase: 109 U/L (ref 39–117)
CO2: 27 mEq/L (ref 19–32)
Calcium: 10.9 mg/dL — ABNORMAL HIGH (ref 8.4–10.5)
Chloride: 96 mEq/L (ref 96–112)
GFR calc Af Amer: 18 mL/min — ABNORMAL LOW (ref >90)
GFR calc non Af Amer: 16 mL/min — ABNORMAL LOW (ref >90)
Glucose, Bld: 150 mg/dL — ABNORMAL HIGH (ref 70–99)
Sodium: 138 mEq/L (ref 135–145)
Total Bilirubin: 0.5 mg/dL (ref 0.3–1.2)

## 2013-02-27 LAB — CBC WITH DIFFERENTIAL/PLATELET
Basophils Absolute: 0.1 10*3/uL (ref 0.0–0.1)
Eosinophils Relative: 0 % (ref 0–5)
HCT: 34 % — ABNORMAL LOW (ref 39.0–52.0)
Lymphocytes Relative: 11 % — ABNORMAL LOW (ref 12–46)
Lymphs Abs: 1.5 10*3/uL (ref 0.7–4.0)
MCV: 89.9 fL (ref 78.0–100.0)
Neutro Abs: 10.7 10*3/uL — ABNORMAL HIGH (ref 1.7–7.7)
Platelets: 181 10*3/uL (ref 150–400)
RBC: 3.78 MIL/uL — ABNORMAL LOW (ref 4.22–5.81)
RDW: 15.6 % — ABNORMAL HIGH (ref 11.5–15.5)
WBC: 13.7 10*3/uL — ABNORMAL HIGH (ref 4.0–10.5)

## 2013-02-27 LAB — URINALYSIS, ROUTINE W REFLEX MICROSCOPIC
Glucose, UA: NEGATIVE mg/dL
Specific Gravity, Urine: 1.01 (ref 1.005–1.030)
pH: 6.5 (ref 5.0–8.0)

## 2013-02-27 LAB — CG4 I-STAT (LACTIC ACID): Lactic Acid, Venous: 2.1 mmol/L (ref 0.5–2.2)

## 2013-02-27 LAB — URINE MICROSCOPIC-ADD ON

## 2013-02-27 LAB — POCT I-STAT TROPONIN I

## 2013-02-27 MED ORDER — SODIUM CHLORIDE 0.9 % IV BOLUS (SEPSIS)
1000.0000 mL | Freq: Once | INTRAVENOUS | Status: AC
  Administered 2013-02-27: 1000 mL via INTRAVENOUS

## 2013-02-27 MED ORDER — DEXTROSE 5 % IV SOLN
1.0000 g | Freq: Once | INTRAVENOUS | Status: AC
  Administered 2013-02-27: 1 g via INTRAVENOUS
  Filled 2013-02-27: qty 1

## 2013-02-27 MED ORDER — DEXTROSE 5 % IV SOLN: 1.0000 g | INTRAVENOUS | Status: DC

## 2013-02-27 NOTE — H&P (Signed)
History and Physical       Hospital Admission Note Date: 02/27/2013  Patient name: [name] Medical record number: [id number] Date of birth: [birth year] Age: 79 y.o. Gender: male PCP: PANG,RICHARD, MD    Chief Complaint:  Worsening Confusion with generalized weakness  HPI: Patient is 79 year old male with history of hypertension, chronic kidney disease, history of recurrent UTIs, recent pneumonia who is currently in a skilled nursing facility presented to ED with confusion. History was obtained from the patient's daughter present in the room. Patient is awake but unable to provide much history due to confusion. Per daughter, patient was noticed to be more confused, sleepy since Saturday. Patient usually gets confused when he has urinary tract infection. Otherwise he did not voice any complaints. Per daughter patient's facility did not mention any fevers or any other symptoms to her. He recently had pneumonia about a week and a half ago and has some residual nonproductive coughing for last several days.  Review of Systems:  Unable to obtain review of systems from the patient due to his acute encephalopathy  Past Medical History: Past Medical History  Diagnosis Date  . Hypertension   . Arthritis   . Ex-smoker   . Rheumatoid arthritis(714.0)   . Pulmonary nodule   . DDD (degenerative disc disease)   . Complication of anesthesia     " I am difficult to wake up "  . Chronic kidney disease     acute on chronic  . Anemia    Past Surgical History  Procedure Laterality Date  . Joint replacement      bilateral knee replacement  . Hernia repair    . Back surgery      X 2  . Appendectomy      Medications: Prior to Admission medications   Medication Sig Start Date End Date Taking? Authorizing Provider  albuterol (PROVENTIL) (2.5 MG/3ML) 0.083% nebulizer solution Take 2.5 mg by nebulization every 4 (four) hours as needed. For  shortness of breath   Yes Historical Provider, MD  alendronate (FOSAMAX) 70 MG tablet Take 70 mg by mouth every 7 (seven) days. Take with a full glass of water on an empty stomach.  On Friday   Yes Historical Provider, MD  aspirin EC 81 MG tablet Take 81 mg by mouth every morning.    Yes Historical Provider, MD  calcium citrate (CALCITRATE - DOSED IN MG ELEMENTAL CALCIUM) 950 MG tablet Take 2 tablets by mouth daily.   Yes Historical Provider, MD  Cholecalciferol (VITAMIN D) 2000 UNITS tablet Take 2,000 Units by mouth every morning.    Yes Historical Provider, MD  escitalopram (LEXAPRO) 10 MG tablet Take 10 mg by mouth daily.   Yes Historical Provider, MD  Fe Fum-FA-B Cmp-C-Zn-Mg-Mn-Cu (HEMATINIC PLUS VIT/MINERALS PO) Take 1 tablet by mouth daily.   Yes Historical Provider, MD  flunisolide (NASAREL) 29 MCG/ACT (0.025%) nasal spray Place 2 sprays into the nose 2 (two) times daily. Dose is for each nostril.   Yes Historical Provider, MD  furosemide (LASIX) 20 MG tablet Take 20 mg by mouth 2 (two) times daily.   Yes Historical Provider, MD  gabapentin (NEURONTIN) 100 MG capsule Take 200 mg by mouth 2 (two) times daily.   Yes Historical Provider, MD  leflunomide (ARAVA) 10 MG tablet Take 10 mg by mouth daily.   Yes Historical Provider, MD  metoprolol tartrate (LOPRESSOR) 25 MG tablet Take 25 mg by mouth 2 (two) times daily.   Yes Historical Provider, MD  omeprazole (PRILOSEC) 20 MG capsule Take 20 mg by mouth daily.    Yes Historical Provider, MD  predniSONE (DELTASONE) 5 MG tablet Take 5 mg by mouth daily.   Yes Historical Provider, MD  Probiotic Product (ALIGN) 4 MG CAPS Take 1 capsule by mouth every morning.   Yes Historical Provider, MD  SUPER B COMPLEX/C CAPS Take 1 capsule by mouth daily.   Yes Historical Provider, MD  Tamsulosin HCl (FLOMAX) 0.4 MG CAPS Take 1 capsule (0.4 mg total) by mouth daily after supper. 06/13/12  Yes Vassie Loll, MD  traMADol (ULTRAM) 50 MG tablet Take 1 tablet (50 mg  total) by mouth every 6 (six) hours as needed. For pain 06/13/12  Yes Vassie Loll, MD  trimethoprim (TRIMPEX) 100 MG tablet Take 100 mg by mouth daily.   Yes Historical Provider, MD    Allergies:   Allergies  Allergen Reactions  . Codeine Other (See Comments)     drives me crazy  . Hydrocodone Other (See Comments)    Makes patient hallucinate, cannot sleep  . Oxycodone Other (See Comments)    Makes patient hallucinate, cannot sleep  . Penicillins Hives  . Sulfa Antibiotics Other (See Comments)    Per MAR    Social History:  reports that he quit smoking about 39 years ago. His smoking use included Cigarettes. He has a 15 pack-year smoking history. He has never used smokeless tobacco. He reports that he does not drink alcohol or use illicit drugs.  Family History: Family History  Problem Relation Age of Onset  . Coronary artery disease Brother   . Hypertension Father   . Hypertension Mother     Physical Exam: Blood pressure 113/30, pulse 94, temperature 99.7 F (37.6 C), resp. rate 23, SpO2 98.00%. General: Somnolent, opens eyes to voice, in no acute distress. HEENT: normocephalic, atraumatic, anicteric sclera, pink conjunctiva, pupils equal and reactive to light and accomodation, oropharynx clear Neck: supple, no masses or lymphadenopathy, no goiter, no bruits  Heart: Regular rate and rhythm, without murmurs, rubs or gallops. Lungs: Clear to auscultation bilaterally, no wheezing, rales or rhonchi. Abdomen: Soft, nontender, nondistended, positive bowel sounds, no masses. Extremities: No clubbing, cyanosis or edema with positive pedal pulses. Neuro: Grossly intact, no focal neurological deficits, able to lift both legs up against gravity Psych: Somewhat somnolent , confused  Skin: no rashes or lesions, warm and dry   LABS on Admission:  Basic Metabolic Panel:  Recent Labs Lab 02/27/13 1939  NA 138  K 4.4  CL 96  CO2 27  GLUCOSE 150*  BUN 57*  CREATININE 3.39*   CALCIUM 10.9*   Liver Function Tests:  Recent Labs Lab 02/27/13 1939  AST 19  ALT 13  ALKPHOS 109  BILITOT 0.5  PROT 7.0  ALBUMIN 2.9*   No results found for this basename: LIPASE, AMYLASE,  in the last 168 hours No results found for this basename: AMMONIA,  in the last 168 hours CBC:  Recent Labs Lab 02/27/13 1939  WBC 13.7*  NEUTROABS 10.7*  HGB 11.0*  HCT 34.0*  MCV 89.9  PLT 181   Cardiac Enzymes: No results found for this basename: CKTOTAL, CKMB, CKMBINDEX, TROPONINI,  in the last 168 hours BNP: No components found with this basename: POCBNP,  CBG: No results found for this basename: GLUCAP,  in the last 168 hours   Radiological Exams on Admission: Dg Chest Port 1 View  02/27/2013   *RADIOLOGY REPORT*  Clinical Data: Altered mental status.  PORTABLE CHEST - 1 VIEW  Comparison: Chest x-ray 01/03/2013.  Findings: The lung volumes are low.  Bibasilar opacities favored to reflect subsegmental atelectasis.  No definite acute consolidative airspace disease.  No definite pleural effusions.  Crowding of the pulmonary vasculature, accentuated by low lung volumes.  No definite pulmonary edema.  Heart size is within normal limits. The patient is rotated to the left on today's exam, resulting in distortion of the mediastinal contours and reduced diagnostic sensitivity and specificity for mediastinal pathology. Atherosclerosis in the thoracic aorta.  Status post median sternotomy.  IMPRESSION: 1.  Low lung volumes with probable bibasilar subsegmental atelectasis. 2.  Atherosclerosis.   Original Report Authenticated By: Trudie Reed, M.D.    Assessment/Plan Principal Problem:   Acute encephalopathy: Likely secondary to UTI - Obtain blood cultures, urine cultures, placed on cefepime, and adjust antibiotics according to the culture results  Active Problems:   COPD (chronic obstructive pulmonary disease): - Currently stable, continue nebs as needed   Acute on CKD (chronic  kidney disease) stage 3, GFR 30-59 ml/min: - Will continue gentle hydration, treat UTI  UTI  (lower urinary tract infection) - Continue cefepime, follow urine cultures   Leukocytosis: Patient is also on prednisone daily at home, follow blood cultures and urine cultures  Generalized deconditioning: PT, OT evaluation  DVT prophylaxis: Lovenox   CODE STATUS: Discussed in detail with patient's daughter at the bedside, opted to be full CODE STATUS   Further plan will depend as patient's clinical course evolves and further radiologic and laboratory data become available.   Time Spent on Admission: 1 hour  RAI,RIPUDEEP M.D. [facility] 02/27/2013, 11:10 PM Pager: [phone]  If 7PM-7AM, please contact night-coverage [email or web address] Password [email or web address]

## 2013-02-27 NOTE — ED Provider Notes (Signed)
History     CSN: [id number]  Arrival date & time 02/27/13  1929   First MD Initiated Contact with Patient 02/27/13 1941      Chief Complaint  Patient presents with  . Altered Mental Status  . Atrial Fibrillation    HPI 79 year old male with a history of chronic kidney disease presents with altered mental status. His daughter is here with him today and she reports that normally he is awake, alert, not confused and he is able to carry on normal conversation. However the last few days she has noted that he has been more somnolent and has been very confused at times, has not known where he was in his been unaware of circumstances in general, has been repeating questions and has been difficult to arouse from time to time.  His daughter reports that he normally only get confused when he has a urinary tract infection. He has not voiced any complaints. Has not complained of pain anywhere. She has not noted that he has a fever. She does feel that he has had a nonproductive cough for several days. He has not had any vomiting or diarrhea.  Review of records demonstrates that the patient was admitted here from approximately April 5 through April 8 with acute on chronic kidney failure and bilateral lower extremity cellulitis. His daughter reports that he also had a urinary tract infection days prior to that admission. She thinks that these infections are what ultimately led to his admission at that time.   Past Medical History  Diagnosis Date  . Hypertension   . Arthritis   . Ex-smoker   . Rheumatoid arthritis(714.0)   . Pulmonary nodule   . DDD (degenerative disc disease)   . Complication of anesthesia     " I am difficult to wake up "  . Chronic kidney disease     acute on chronic  . Anemia     Past Surgical History  Procedure Laterality Date  . Joint replacement      bilateral knee replacement  . Hernia repair    . Back surgery      X 2  . Appendectomy      Family History   Problem Relation Age of Onset  . Coronary artery disease Brother   . Hypertension Father   . Hypertension Mother     History  Substance Use Topics  . Smoking status: Former Smoker -- 0.50 packs/day for 30 years    Types: Cigarettes    Quit date: 10/12/1973  . Smokeless tobacco: Never Used  . Alcohol Use: No      Review of Systems  Unable to perform ROS: Mental status change    Allergies  Codeine; Hydrocodone; Oxycodone; Penicillins; and Sulfa antibiotics  Home Medications   Current Outpatient Rx  Name  Route  Sig  Dispense  Refill  . albuterol (PROVENTIL) (2.5 MG/3ML) 0.083% nebulizer solution   Nebulization   Take 2.5 mg by nebulization every 4 (four) hours as needed. For shortness of breath         . alendronate (FOSAMAX) 70 MG tablet   Oral   Take 70 mg by mouth every 7 (seven) days. Take with a full glass of water on an empty stomach.  On Friday         . aspirin EC 81 MG tablet   Oral   Take 81 mg by mouth every morning.          . calcium citrate (CALCITRATE -  DOSED IN MG ELEMENTAL CALCIUM) 950 MG tablet   Oral   Take 2 tablets by mouth daily.         . Cholecalciferol (VITAMIN D) 2000 UNITS tablet   Oral   Take 2,000 Units by mouth every morning.          . escitalopram (LEXAPRO) 10 MG tablet   Oral   Take 10 mg by mouth daily.         . Fe Fum-FA-B Cmp-C-Zn-Mg-Mn-Cu (HEMATINIC PLUS VIT/MINERALS PO)   Oral   Take 1 tablet by mouth daily.         . flunisolide (NASAREL) 29 MCG/ACT (0.025%) nasal spray   Nasal   Place 2 sprays into the nose 2 (two) times daily. Dose is for each nostril.         . furosemide (LASIX) 20 MG tablet   Oral   Take 20 mg by mouth 2 (two) times daily.         Marland Kitchen gabapentin (NEURONTIN) 100 MG capsule   Oral   Take 200 mg by mouth 2 (two) times daily.         Marland Kitchen leflunomide (ARAVA) 10 MG tablet   Oral   Take 10 mg by mouth daily.         . metoprolol tartrate (LOPRESSOR) 25 MG tablet   Oral    Take 25 mg by mouth 2 (two) times daily.         Marland Kitchen omeprazole (PRILOSEC) 20 MG capsule   Oral   Take 20 mg by mouth daily.          . predniSONE (DELTASONE) 5 MG tablet   Oral   Take 5 mg by mouth daily.         . Probiotic Product (ALIGN) 4 MG CAPS   Oral   Take 1 capsule by mouth every morning.         Kristin Bruins B COMPLEX/C CAPS   Oral   Take 1 capsule by mouth daily.         . Tamsulosin HCl (FLOMAX) 0.4 MG CAPS   Oral   Take 1 capsule (0.4 mg total) by mouth daily after supper.         . traMADol (ULTRAM) 50 MG tablet   Oral   Take 1 tablet (50 mg total) by mouth every 6 (six) hours as needed. For pain   30 tablet   0   . trimethoprim (TRIMPEX) 100 MG tablet   Oral   Take 100 mg by mouth daily.           BP 113/30  Pulse 94  Temp(Src) 99.7 F (37.6 C)  Resp 23  SpO2 98%  Physical Exam  Nursing note and vitals reviewed. Constitutional: He is oriented to person, place, and time. He appears well-developed and well-nourished. No distress.  Somnolent, eyes open to voice, follows commands intermittently, oriented to person but not place, time, or situation  HENT:  Head: Normocephalic and atraumatic.  Mouth/Throat: Oropharynx is clear and moist.  Eyes: Conjunctivae and EOM are normal. Pupils are equal, round, and reactive to light. No scleral icterus.  Neck: Normal range of motion. Neck supple. No JVD present.  Cardiovascular: Regular rhythm, normal heart sounds and intact distal pulses.  Tachycardia present.  Exam reveals no gallop and no friction rub.   No murmur heard. Pulmonary/Chest: Breath sounds normal. Tachypnea noted. No respiratory distress. He has no wheezes. He has no rales.  Abdominal:  Soft. Bowel sounds are normal. He exhibits no distension. There is no tenderness. There is no rebound and no guarding.  Musculoskeletal: He exhibits no edema.  Neurological: He is alert and oriented to person, place, and time. No cranial nerve deficit. He  exhibits normal muscle tone. Coordination normal.  Skin: Skin is warm and dry. He is not diaphoretic.    ED Course  Procedures (including critical care time)  Labs Reviewed  CBC WITH DIFFERENTIAL - Abnormal; Notable for the following:    WBC 13.7 (*)    RBC 3.78 (*)    Hemoglobin 11.0 (*)    HCT 34.0 (*)    RDW 15.6 (*)    Neutrophils Relative % 78 (*)    Neutro Abs 10.7 (*)    Lymphocytes Relative 11 (*)    Monocytes Absolute 1.4 (*)    All other components within normal limits  COMPREHENSIVE METABOLIC PANEL - Abnormal; Notable for the following:    Glucose, Bld 150 (*)    BUN 57 (*)    Creatinine, Ser 3.39 (*)    Calcium 10.9 (*)    Albumin 2.9 (*)    GFR calc non Af Amer 16 (*)    GFR calc Af Amer 18 (*)    All other components within normal limits  URINALYSIS, ROUTINE W REFLEX MICROSCOPIC - Abnormal; Notable for the following:    APPearance TURBID (*)    Hgb urine dipstick LARGE (*)    Protein, ur 100 (*)    Leukocytes, UA LARGE (*)    All other components within normal limits  URINE MICROSCOPIC-ADD ON - Abnormal; Notable for the following:    Bacteria, UA MANY (*)    All other components within normal limits  CULTURE, BLOOD (ROUTINE X 2)  CULTURE, BLOOD (ROUTINE X 2)  URINE CULTURE  CG4 I-STAT (LACTIC ACID)  POCT I-STAT TROPONIN I   Ct Head Wo Contrast  02/27/2013   *RADIOLOGY REPORT*  Clinical Data: Altered mental status.  CT HEAD WITHOUT CONTRAST  Technique:  Contiguous axial images were obtained from the base of the skull through the vertex without contrast.  Comparison: No priors.  Findings: Moderate cerebral and mild cerebellar atrophy.  Patchy areas of decreased attenuation throughout the deep and periventricular white matter of the cerebral hemispheres bilaterally are compatible with chronic microvascular ischemic disease. No acute intracranial abnormalities.  Specifically, no evidence of acute intracranial hemorrhage, no definite findings of acute/subacute  cerebral ischemia, no mass, mass effect, hydrocephalus or abnormal intra or extra-axial fluid collections. Visualized paranasal sinuses and mastoids are generally well pneumatized, with the exception of some mild mucosal thickening throughout the inferior aspect of the maxillary sinuses bilaterally.  No acute displaced skull fractures are identified.  IMPRESSION: 1.  No acute intracranial abnormalities. 2.  Moderate cerebral and mild cerebellar atrophy with chronic microvascular ischemic disease in the cerebral white matter, as above. 3.  Mild paranasal sinus disease, as above.   Original Report Authenticated By: Trudie Reed, M.D.   Dg Chest Port 1 View  02/27/2013   *RADIOLOGY REPORT*  Clinical Data: Altered mental status.  PORTABLE CHEST - 1 VIEW  Comparison: Chest x-ray 01/03/2013.  Findings: The lung volumes are low.  Bibasilar opacities favored to reflect subsegmental atelectasis.  No definite acute consolidative airspace disease.  No definite pleural effusions.  Crowding of the pulmonary vasculature, accentuated by low lung volumes.  No definite pulmonary edema.  Heart size is within normal limits. The patient is rotated to the left  on today's exam, resulting in distortion of the mediastinal contours and reduced diagnostic sensitivity and specificity for mediastinal pathology. Atherosclerosis in the thoracic aorta.  Status post median sternotomy.  IMPRESSION: 1.  Low lung volumes with probable bibasilar subsegmental atelectasis. 2.  Atherosclerosis.   Original Report Authenticated By: Trudie Reed, M.D.     1. UTI (lower urinary tract infection)   2. Altered mental status   3. Acute kidney failure   4. Acute encephalopathy   5. Anemia       MDM  D82-year-old male here with altered metal status, tachycardia to 118 at presentation. No localizing exam findings to suggest infection. He voiced no complaints prior but family noticed is not biting himself and has become more and more somnolent  and confused over the last few days.  Tachycardia 118 on arrival, vitals otherwise within normal limits, there may be nursing documentation suggesting that he was hypoxic, however he had been placed on a rebreather several times and when I reevaluated him at bedside and took off nonrebreather and placed him on 1 L by nasal cannula he maintained sats in the 95% range. he is confused, oriented to person but not place, time, or situation. He is tachypneic to low 20s, but breath sounds are clear. Exam otherwise unremarkable.  Workup pertinent for Acute kidney injury and UTI.  creatinine is approximately 1.5-1.8 at baseline, currently 3.39, BUN 57, white blood cell 13.7, hemoglobin 11, chest x-ray demonstrates bibasilar atelectasis, no pneumonia, CT head demonstrates NAICA  UA with many bacteria, large leukocytes  Treated UTI with Cefepime; last UTI grew Pseudomonas; has severe PCN allergy   Admitted to the hospitalist for altered metal status, urinary tract infection, and acute kidney injury        Toney Sang, MD 02/28/13 0004

## 2013-02-27 NOTE — ED Notes (Signed)
Pts daughter reports she is going to go home. Her contact number is: Cell: [phone] Home: [phone]

## 2013-02-27 NOTE — ED Notes (Signed)
Patient resides at [facility],  Reported to be altered when the staff came to deliver his tray.    EMs called to scene,  Patient found to be in chair, responding to staff when spoken to loudly.   Responses are slower than usual.  Patient also found to be in afib, uncontrolled rate of 130.  Patient is on continuous oxygen per chart.  Patient with no s/sx of resp distress.  98 percent on nonrebreather.  cbg 134.

## 2013-02-28 ENCOUNTER — Encounter (HOSPITAL_COMMUNITY): Payer: Self-pay | Admitting: General Practice

## 2013-02-28 LAB — BASIC METABOLIC PANEL
CO2: 25 mEq/L (ref 19–32)
GFR calc non Af Amer: 18 mL/min — ABNORMAL LOW (ref >90)
Glucose, Bld: 108 mg/dL — ABNORMAL HIGH (ref 70–99)
Potassium: 4 mEq/L (ref 3.5–5.1)
Sodium: 140 mEq/L (ref 135–145)

## 2013-02-28 LAB — MRSA PCR SCREENING: MRSA by PCR: NEGATIVE

## 2013-02-28 LAB — CBC
Hemoglobin: 9.3 g/dL — ABNORMAL LOW (ref 13.0–17.0)
MCH: 29.7 pg (ref 26.0–34.0)
RBC: 3.13 MIL/uL — ABNORMAL LOW (ref 4.22–5.81)

## 2013-02-28 MED ORDER — SODIUM CHLORIDE 0.9 % IV SOLN: INTRAVENOUS | Status: DC

## 2013-02-28 MED ORDER — LEFLUNOMIDE 10 MG PO TABS
10.0000 mg | ORAL_TABLET | Freq: Every day | ORAL | Status: DC
  Administered 2013-02-28 – 2013-03-02 (×3): 10 mg via ORAL
  Filled 2013-02-28 (×3): qty 1

## 2013-02-28 MED ORDER — VITAMIN D 50 MCG (2000 UT) PO TABS: 2000.0000 [IU] | ORAL_TABLET | Freq: Every morning | ORAL | Status: DC

## 2013-02-28 MED ORDER — VITAMIN D3 25 MCG (1000 UNIT) PO TABS
2000.0000 [IU] | ORAL_TABLET | Freq: Every day | ORAL | Status: DC
  Administered 2013-02-28 – 2013-03-02 (×3): 2000 [IU] via ORAL
  Filled 2013-02-28 (×3): qty 2

## 2013-02-28 MED ORDER — CALCIUM CITRATE 950 (200 CA) MG PO TABS
2.0000 | ORAL_TABLET | Freq: Every day | ORAL | Status: DC
  Administered 2013-02-28 – 2013-03-02 (×3): 400 mg via ORAL
  Filled 2013-02-28 (×3): qty 2

## 2013-02-28 MED ORDER — CIPROFLOXACIN IN D5W 400 MG/200ML IV SOLN
400.0000 mg | INTRAVENOUS | Status: DC
  Administered 2013-02-28 – 2013-03-01 (×2): 400 mg via INTRAVENOUS
  Filled 2013-02-28 (×3): qty 200

## 2013-02-28 MED ORDER — ALBUTEROL SULFATE (5 MG/ML) 0.5% IN NEBU: 2.5000 mg | INHALATION_SOLUTION | RESPIRATORY_TRACT | Status: DC | PRN

## 2013-02-28 MED ORDER — SODIUM CHLORIDE 0.9 % IV SOLN
INTRAVENOUS | Status: DC
  Administered 2013-02-28: 16:00:00 via INTRAVENOUS
  Administered 2013-03-01: 75 mL via INTRAVENOUS
  Administered 2013-03-01: 1000 mL via INTRAVENOUS
  Administered 2013-03-01 (×2): via INTRAVENOUS
  Administered 2013-03-01: 75 mL via INTRAVENOUS

## 2013-02-28 MED ORDER — PANTOPRAZOLE SODIUM 40 MG PO TBEC
40.0000 mg | DELAYED_RELEASE_TABLET | Freq: Every day | ORAL | Status: DC
  Administered 2013-02-28 – 2013-03-02 (×3): 40 mg via ORAL
  Filled 2013-02-28 (×2): qty 1

## 2013-02-28 MED ORDER — ENOXAPARIN SODIUM 30 MG/0.3ML ~~LOC~~ SOLN
30.0000 mg | SUBCUTANEOUS | Status: DC
  Administered 2013-02-28 – 2013-03-02 (×3): 30 mg via SUBCUTANEOUS
  Filled 2013-02-28 (×3): qty 0.3

## 2013-02-28 MED ORDER — TAMSULOSIN HCL 0.4 MG PO CAPS
0.4000 mg | ORAL_CAPSULE | Freq: Every day | ORAL | Status: DC
  Administered 2013-02-28 – 2013-03-02 (×3): 0.4 mg via ORAL
  Filled 2013-02-28 (×3): qty 1

## 2013-02-28 MED ORDER — ESCITALOPRAM OXALATE 10 MG PO TABS
10.0000 mg | ORAL_TABLET | Freq: Every day | ORAL | Status: DC
  Administered 2013-02-28 – 2013-03-02 (×3): 10 mg via ORAL
  Filled 2013-02-28 (×3): qty 1

## 2013-02-28 MED ORDER — ASPIRIN EC 81 MG PO TBEC
81.0000 mg | DELAYED_RELEASE_TABLET | Freq: Every morning | ORAL | Status: DC
  Administered 2013-02-28 – 2013-03-02 (×3): 81 mg via ORAL
  Filled 2013-02-28 (×3): qty 1

## 2013-02-28 MED ORDER — HYDROMORPHONE HCL PF 1 MG/ML IJ SOLN
1.0000 mg | INTRAMUSCULAR | Status: DC | PRN
  Administered 2013-03-01 – 2013-03-02 (×3): 1 mg via INTRAVENOUS
  Filled 2013-02-28 (×3): qty 1

## 2013-02-28 MED ORDER — SODIUM CHLORIDE 0.9 % IJ SOLN
3.0000 mL | Freq: Two times a day (BID) | INTRAMUSCULAR | Status: DC
  Administered 2013-02-28: 3 mL via INTRAVENOUS

## 2013-02-28 MED ORDER — FLUTICASONE PROPIONATE 50 MCG/ACT NA SUSP
2.0000 | Freq: Every day | NASAL | Status: DC
  Administered 2013-02-28 – 2013-03-02 (×3): 2 via NASAL
  Filled 2013-02-28: qty 16

## 2013-02-28 MED ORDER — PREDNISONE 5 MG PO TABS
5.0000 mg | ORAL_TABLET | Freq: Every day | ORAL | Status: DC
  Administered 2013-02-28 – 2013-03-02 (×3): 5 mg via ORAL
  Filled 2013-02-28 (×4): qty 1

## 2013-02-28 MED ORDER — ONDANSETRON HCL 4 MG/2ML IJ SOLN: 4.0000 mg | Freq: Four times a day (QID) | INTRAMUSCULAR | Status: DC | PRN

## 2013-02-28 MED ORDER — ACETAMINOPHEN 650 MG RE SUPP: 650.0000 mg | Freq: Four times a day (QID) | RECTAL | Status: DC | PRN

## 2013-02-28 MED ORDER — ONDANSETRON HCL 4 MG PO TABS: 4.0000 mg | ORAL_TABLET | Freq: Four times a day (QID) | ORAL | Status: DC | PRN

## 2013-02-28 MED ORDER — METOPROLOL TARTRATE 25 MG PO TABS
25.0000 mg | ORAL_TABLET | Freq: Two times a day (BID) | ORAL | Status: DC
  Administered 2013-02-28 – 2013-03-02 (×6): 25 mg via ORAL
  Filled 2013-02-28 (×7): qty 1

## 2013-02-28 MED ORDER — ACETAMINOPHEN 325 MG PO TABS: 650.0000 mg | ORAL_TABLET | Freq: Four times a day (QID) | ORAL | Status: DC | PRN

## 2013-02-28 NOTE — Progress Notes (Signed)
[facility] PROGRESS NOTE  [name] OZH:[id number] DOB: 07-Nov-1927 DOA: 02/27/2013 PCP: [name], MD  Assessment/Plan: Acute encephalopathy -Likely due to a UTI and acute on chronic renal failure -Continue IV fluids -Consult speech therapy for swallow evaluation--they feel that the patient will need to be reevaluated as he becomes more alert and more cooperative -Currently they recommended n.p.o. but medications may be administered with pure consistency and need MBS Pyuria -Discontinue cefepime as this may cause neurotoxicity in patients with renal failure -Start empiric ciprofloxacin pending culture data Acute on chronic renal failure -Continue IV fluids -Patient has had wide variations in his serum creatinine -Review of records shows recent serum creatinine as low as 1.4 Leukocytosis -Improved with fluids and antibiotics COPD -Compensated at this time without any exacerbation -Continue aerosolized albuterol  Family Communication:   Pt at beside Disposition Plan:   SNF when medically stable    Antibiotics:  Cefepime 6/2/14x1  cipro 02/28/13>>>    Procedures/Studies: Ct Head Wo Contrast  02/27/2013   *RADIOLOGY REPORT*  Clinical Data: Altered mental status.  CT HEAD WITHOUT CONTRAST  Technique:  Contiguous axial images were obtained from the base of the skull through the vertex without contrast.  Comparison: No priors.  Findings: Moderate cerebral and mild cerebellar atrophy.  Patchy areas of decreased attenuation throughout the deep and periventricular white matter of the cerebral hemispheres bilaterally are compatible with chronic microvascular ischemic disease. No acute intracranial abnormalities.  Specifically, no evidence of acute intracranial hemorrhage, no definite findings of acute/subacute cerebral ischemia, no mass, mass effect, hydrocephalus or abnormal intra or extra-axial fluid collections. Visualized paranasal sinuses and mastoids are generally well  pneumatized, with the exception of some mild mucosal thickening throughout the inferior aspect of the maxillary sinuses bilaterally.  No acute displaced skull fractures are identified.  IMPRESSION: 1.  No acute intracranial abnormalities. 2.  Moderate cerebral and mild cerebellar atrophy with chronic microvascular ischemic disease in the cerebral white matter, as above. 3.  Mild paranasal sinus disease, as above.   Original Report Authenticated By: Trudie Reed, M.D.   Dg Chest Port 1 View  02/27/2013   *RADIOLOGY REPORT*  Clinical Data: Altered mental status.  PORTABLE CHEST - 1 VIEW  Comparison: Chest x-ray 01/03/2013.  Findings: The lung volumes are low.  Bibasilar opacities favored to reflect subsegmental atelectasis.  No definite acute consolidative airspace disease.  No definite pleural effusions.  Crowding of the pulmonary vasculature, accentuated by low lung volumes.  No definite pulmonary edema.  Heart size is within normal limits. The patient is rotated to the left on today's exam, resulting in distortion of the mediastinal contours and reduced diagnostic sensitivity and specificity for mediastinal pathology. Atherosclerosis in the thoracic aorta.  Status post median sternotomy.  IMPRESSION: 1.  Low lung volumes with probable bibasilar subsegmental atelectasis. 2.  Atherosclerosis.   Original Report Authenticated By: Trudie Reed, M.D.         Subjective: Patient is awake but pleasantly confused. He denies any shortness of breath or pain at this time. No reports of respiratory distress, vomiting, diarrhea.  Objective: Filed Vitals:   02/27/13 2230 02/28/13 0012 02/28/13 0125 02/28/13 0523  BP: 113/30 106/43  106/61  Pulse: 94 100 115 101  Temp: 99.7 F (37.6 C) 99.8 F (37.7 C)  99.6 F (37.6 C)  TempSrc:  Oral  Oral  Resp: 23 23  21   Height:  5' 6.14" (1.68 m)    Weight:  75.467 kg (166 lb 6 oz)  SpO2: 98% 99%  96%    Intake/Output Summary (Last 24 hours) at 02/28/13  0905 Last data filed at 02/28/13 0600  Gross per 24 hour  Intake    280 ml  Output    900 ml  Net   -620 ml   Weight change:  Exam:   General:  Pt is alert, follows commands appropriately, not in acute distress  HEENT: No icterus, No thrush, [place]/AT  Cardiovascular: RRR, S1/S2, no rubs, no gallops  Respiratory: CTA bilaterally, no wheezing, no crackles, no rhonchi  Abdomen: Soft/+BS, non tender, non distended, no guarding  Extremities: No edema, No lymphangitis, No petechiae, No rashes, no synovitis  Data Reviewed: Basic Metabolic Panel:  Recent Labs Lab 02/27/13 1939 02/28/13 0600  NA 138 140  K 4.4 4.0  CL 96 105  CO2 27 25  GLUCOSE 150* 108*  BUN 57* 49*  CREATININE 3.39* 2.97*  CALCIUM 10.9* 9.0   Liver Function Tests:  Recent Labs Lab 02/27/13 1939  AST 19  ALT 13  ALKPHOS 109  BILITOT 0.5  PROT 7.0  ALBUMIN 2.9*   No results found for this basename: LIPASE, AMYLASE,  in the last 168 hours No results found for this basename: AMMONIA,  in the last 168 hours CBC:  Recent Labs Lab 02/27/13 1939 02/28/13 0600  WBC 13.7* 10.0  NEUTROABS 10.7*  --   HGB 11.0* 9.3*  HCT 34.0* 27.9*  MCV 89.9 89.1  PLT 181 171   Cardiac Enzymes: No results found for this basename: CKTOTAL, CKMB, CKMBINDEX, TROPONINI,  in the last 168 hours BNP: No components found with this basename: POCBNP,  CBG: No results found for this basename: GLUCAP,  in the last 168 hours  Recent Results (from the past 240 hour(s))  MRSA PCR SCREENING     Status: None   Collection Time    02/28/13 12:25 AM      Result Value Range Status   MRSA by PCR NEGATIVE  NEGATIVE Final   Comment:            The GeneXpert MRSA Assay (FDA     approved for NASAL specimens     only), is one component of a     comprehensive MRSA colonization     surveillance program. It is not     intended to diagnose MRSA     infection nor to guide or     monitor treatment for     MRSA infections.      Scheduled Meds: . aspirin EC  81 mg Oral q morning - 10a  . calcium citrate  2 tablet Oral Daily  . cholecalciferol  2,000 Units Oral Daily  . ciprofloxacin  400 mg Intravenous Q24H  . enoxaparin (LOVENOX) injection  30 mg Subcutaneous Q24H  . escitalopram  10 mg Oral Daily  . fluticasone  2 spray Each Nare Daily  . leflunomide  10 mg Oral Daily  . metoprolol tartrate  25 mg Oral BID  . pantoprazole  40 mg Oral Daily  . predniSONE  5 mg Oral Q breakfast  . sodium chloride  3 mL Intravenous Q12H  . tamsulosin  0.4 mg Oral QPC supper   Continuous Infusions: . sodium chloride       Bran Aldridge, DO  [facility] Pager [phone]  If 7PM-7AM, please contact night-coverage [email or web address] Password [email or web address] 02/28/2013, 9:05 AM   LOS: 1 day

## 2013-02-28 NOTE — Progress Notes (Signed)
UR COMPLETED  

## 2013-02-28 NOTE — ED Notes (Signed)
Pt returned from rad. dept and pt transferred to [id number] by EMT

## 2013-02-28 NOTE — Evaluation (Signed)
Clinical/Bedside Swallow Evaluation Patient Details  Name: [name] MRN: [id number] Date of Birth: [birth year]  Today's Date: 02/28/2013 Time: 1030-     Past Medical History:  Past Medical History  Diagnosis Date  . Hypertension   . Arthritis   . Ex-smoker   . Rheumatoid arthritis(714.0)   . Pulmonary nodule   . DDD (degenerative disc disease)   . Complication of anesthesia     " I am difficult to wake up "  . Chronic kidney disease     acute on chronic  . Anemia    Past Surgical History:  Past Surgical History  Procedure Laterality Date  . Joint replacement      bilateral knee replacement  . Hernia repair    . Back surgery      X 2  . Appendectomy     HPI:  Patient is 79 year old male with history of hypertension, chronic kidney disease, history of recurrent UTIs, recent pneumonia who is currently in a skilled nursing facility presented to ED with confusion. History was obtained from the patient's daughter present in the room. Patient is awake but unable to provide much history due to confusion. Per daughter, patient was noticed to be more confused, sleepy since Saturday. Patient usually gets confused when he has urinary tract infection. Otherwise he did not voice any complaints. Per daughter patient's facility did not mention any fevers or any other symptoms to her. He recently had pneumonia about a week and a half ago and has some residual nonproductive coughing for last several days.   Assessment / Plan / Recommendation Clinical Impression  Pt. poorly cooperative, stating, "take your applesauce and cram it."  Pt. very with congested cough prior to any po given, and currently running fever.  Pt. took one ice chip and one sip of water via spoon, but refused all other consistencies.  No overt s/s of aspiration was observed with water and ice.  Pt. will need further evaluation when he is more cooperative, prior to diet initiation.  May need MBS for objective swallow  evaluation, as this is a recurrent pneumonia, if/when he is able to cooperate and take barium.    Aspiration Risk  Mild    Diet Recommendation NPO except meds;Ice chips PRN after oral care   Liquid Administration via: Spoon Medication Administration: Whole meds with puree (crush if needed) Compensations: Slow rate;Small sips/bites Postural Changes and/or Swallow Maneuvers: Seated upright 90 degrees    Other  Recommendations Oral Care Recommendations: Oral care BID Other Recommendations: Have oral suction available   Follow Up Recommendations  Skilled Nursing facility;24 hour supervision/assistance    Frequency and Duration min 3x week  2 weeks   Pertinent Vitals/Pain n/a    SLP Swallow Goals Goal #3: Pt. will cooperate in further assessment at bedside (and possibly with MBS) with various consistencies for diet to be initiated.   Swallow Study Prior Functional Status       General HPI: Patient is 79 year old male with history of hypertension, chronic kidney disease, history of recurrent UTIs, recent pneumonia who is currently in a skilled nursing facility presented to ED with confusion. History was obtained from the patient's daughter present in the room. Patient is awake but unable to provide much history due to confusion. Per daughter, patient was noticed to be more confused, sleepy since Saturday. Patient usually gets confused when he has urinary tract infection. Otherwise he did not voice any complaints. Per daughter patient's facility did not mention any fevers or  any other symptoms to her. He recently had pneumonia about a week and a half ago and has some residual nonproductive coughing for last several days. Type of Study: Bedside swallow evaluation Previous Swallow Assessment: None found in [facility] system Diet Prior to this Study: NPO Temperature Spikes Noted: Yes Respiratory Status: Supplemental O2 delivered via (comment) History of Recent Intubation: No Behavior/Cognition:  Alert;Confused;Uncooperative;Distractible;Requires cueing;Doesn't follow directions;Decreased sustained attention Oral Cavity - Dentition: Adequate natural dentition Self-Feeding Abilities: Total assist Patient Positioning: Upright in bed Baseline Vocal Quality: Clear Volitional Cough: Cognitively unable to elicit Volitional Swallow: Unable to elicit    Oral/Motor/Sensory Function Overall Oral Motor/Sensory Function: Appears within functional limits for tasks assessed (Pt. would not f/c for Oral-motor exam)   Ice Chips Ice chips: Within functional limits Presentation: Spoon   Thin Liquid Thin Liquid: Within functional limits Presentation: Spoon    Nectar Thick Nectar Thick Liquid: Not tested   Honey Thick Honey Thick Liquid: Not tested   Puree Puree: Not tested (Pt. refused.)   Solid   GO    Solid: Not tested (pt. refused)       Anne Hahn, Dimetri Armitage T 02/28/2013,11:12 AM

## 2013-02-28 NOTE — Progress Notes (Signed)
OT Cancellation Note  Patient Details Name: [name] MRN: [id number] DOB: [birth year]   Cancelled Treatment:    Reason Eval/Treat Not Completed: Other (comment) (note pt from SNF. Will defer OT eval to SNF)  Lennox Laity [phone] 02/28/2013, 12:08 PM

## 2013-02-28 NOTE — Progress Notes (Signed)
PT Cancellation Note  Patient Details Name: [name] MRN: [id number] DOB: 09-Nov-1927   Cancelled Treatment:    Reason Eval/Treat Not Completed: Medical issues which prohibited therapy (Per RN pt was tachycardic and agitated this AM, now asleep.) RN requested pt not be disturbed at this time. Will re-attempt PT eval tomorrow.    Ralene Bathe Kistler 02/28/2013, 1:02 PM [phone]

## 2013-03-01 DIAGNOSIS — D72829 Elevated white blood cell count, unspecified: Secondary | ICD-10-CM

## 2013-03-01 DIAGNOSIS — R300 Dysuria: Secondary | ICD-10-CM

## 2013-03-01 LAB — URINE CULTURE: Colony Count: >=100000

## 2013-03-01 LAB — BASIC METABOLIC PANEL
CO2: 25 mEq/L (ref 19–32)
Chloride: 109 mEq/L (ref 96–112)
Creatinine, Ser: 2.65 mg/dL — ABNORMAL HIGH (ref 0.50–1.35)
GFR calc Af Amer: 24 mL/min — ABNORMAL LOW (ref >90)
Potassium: 3.6 mEq/L (ref 3.5–5.1)
Sodium: 145 mEq/L (ref 135–145)

## 2013-03-01 LAB — MAGNESIUM: Magnesium: 1.7 mg/dL (ref 1.5–2.5)

## 2013-03-01 MED ORDER — DEXTROSE 5 % IV SOLN
1.0000 g | INTRAVENOUS | Status: DC
  Administered 2013-03-01: 1 g via INTRAVENOUS
  Filled 2013-03-01 (×2): qty 1

## 2013-03-01 MED ORDER — DEXTROSE 5 % IV SOLN
1.0000 g | INTRAVENOUS | Status: DC
  Administered 2013-03-01: 1 g via INTRAVENOUS
  Filled 2013-03-01 (×2): qty 10

## 2013-03-01 NOTE — Progress Notes (Addendum)
[facility] PROGRESS NOTE  [name] ZOX:[id number] DOB: 1928/07/18 DOA: 02/27/2013 PCP: [name], MD  Assessment/Plan: Acute encephalopathy  -Likely due to a UTI and acute on chronic renal failure  -Continue IV fluids  -speech therapy consulted for swallow evaluation--they have recommended dysphagia 3 diet.  Pyuria  - Urine culture growing > 100,000 cfu of proteus mirabilis - Organism is MDR as such will order picc line for planned prolonged treatment of complicated uti.  Would treat for a total of 7-10 days depending on hospital condition. - Addendum: will discontinue current antibiotics fortaz and cipro and place on rocephin based on urine sensitivities.  Acute on chronic renal failure  -Continue IV fluids  -improving on iv fluids.  Creatinine 2.6 today - will plan on reassessing next am.  Leukocytosis  -Improved with fluids and antibiotics   COPD  -Compensated at this time without any exacerbation  -Continue aerosolized albuterol   Code: full Family Communication: Pt at beside  Disposition Plan: SNF when medically stable    Consultants:  none  Procedures:  Placed order for Picc line: pending  Antibiotics:  cipro and fortaz d/c'd 6/4  On rocephin  HPI/Subjective: No new complaints. No acute issues reported overnight. Speech therapy feels patient can try to eat a dysphagia diet  Objective: Filed Vitals:   02/28/13 1500 02/28/13 2027 03/01/13 0553 03/01/13 0901  BP: 113/56 127/74 152/66 164/83  Pulse: 79 68 90 92  Temp: 99.5 F (37.5 C) 99.8 F (37.7 C) 99.2 F (37.3 C) 99.3 F (37.4 C)  TempSrc: Axillary Oral Oral Oral  Resp: 17 18 18 18   Height:  5' 6.14" (1.68 m)    Weight:  75.467 kg (166 lb 6 oz)    SpO2: 91% 95% 97% 90%    Intake/Output Summary (Last 24 hours) at 03/01/13 1455 Last data filed at 03/01/13 0700  Gross per 24 hour  Intake   3520 ml  Output   1625 ml  Net   1895 ml   Filed Weights   02/28/13 0012 02/28/13 2027   Weight: 75.467 kg (166 lb 6 oz) 75.467 kg (166 lb 6 oz)    Exam:   General:  Alert and Awake in NAD  Cardiovascular: RRR, no mrg  Respiratory: CTA BL, no wheezes  Abdomen: soft, NT, ND  Musculoskeletal: no cyanosis   Data Reviewed: Basic Metabolic Panel:  Recent Labs Lab 02/27/13 1939 02/28/13 0600 03/01/13 0548  NA 138 140 145  K 4.4 4.0 3.6  CL 96 105 109  CO2 27 25 25   GLUCOSE 150* 108* 84  BUN 57* 49* 42*  CREATININE 3.39* 2.97* 2.65*  CALCIUM 10.9* 9.0 9.3  MG  --   --  1.7   Liver Function Tests:  Recent Labs Lab 02/27/13 1939  AST 19  ALT 13  ALKPHOS 109  BILITOT 0.5  PROT 7.0  ALBUMIN 2.9*   No results found for this basename: LIPASE, AMYLASE,  in the last 168 hours No results found for this basename: AMMONIA,  in the last 168 hours CBC:  Recent Labs Lab 02/27/13 1939 02/28/13 0600  WBC 13.7* 10.0  NEUTROABS 10.7*  --   HGB 11.0* 9.3*  HCT 34.0* 27.9*  MCV 89.9 89.1  PLT 181 171   Cardiac Enzymes: No results found for this basename: CKTOTAL, CKMB, CKMBINDEX, TROPONINI,  in the last 168 hours BNP (last 3 results)  Recent Labs  03/06/12 1505 06/05/12 0524 01/03/13 0552  PROBNP 173.7 506.6* 602.9*  CBG: No results found for this basename: GLUCAP,  in the last 168 hours  Recent Results (from the past 240 hour(s))  CULTURE, BLOOD (ROUTINE X 2)     Status: None   Collection Time    02/27/13  8:45 PM      Result Value Range Status   Specimen Description BLOOD ARM RIGHT   Final   Special Requests BOTTLES DRAWN AEROBIC AND ANAEROBIC 10CC   Final   Culture  Setup Time 02/28/2013 02:14   Final   Culture     Final   Value: PROTEUS SPECIES     Note: Gram Stain Report Called to,Read Back By and Verified With: CASSIE WICKER ON 03/01/2013 AT 12:07A BY WILEJ   Report Status PENDING   Incomplete  CULTURE, BLOOD (ROUTINE X 2)     Status: None   Collection Time    02/27/13  8:53 PM      Result Value Range Status   Specimen Description  BLOOD ARM LEFT   Final   Special Requests BOTTLES DRAWN AEROBIC ONLY 10CC   Final   Culture  Setup Time 02/28/2013 02:15   Final   Culture     Final   Value: PROTEUS SPECIES     Note: Gram Stain Report Called to,Read Back By and Verified With: CASSIE WICKER ON 03/01/2013 AT 12:07A BY WILEJ   Report Status PENDING   Incomplete  URINE CULTURE     Status: None   Collection Time    02/27/13  9:25 PM      Result Value Range Status   Specimen Description URINE, CATHETERIZED   Final   Special Requests NONE   Final   Culture  Setup Time 02/27/2013 22:02   Final   Colony Count >=100,000 COLONIES/ML   Final   Culture PROTEUS MIRABILIS   Final   Report Status 03/01/2013 FINAL   Final   Organism ID, Bacteria PROTEUS MIRABILIS   Final  MRSA PCR SCREENING     Status: None   Collection Time    02/28/13 12:25 AM      Result Value Range Status   MRSA by PCR NEGATIVE  NEGATIVE Final   Comment:            The GeneXpert MRSA Assay (FDA     approved for NASAL specimens     only), is one component of a     comprehensive MRSA colonization     surveillance program. It is not     intended to diagnose MRSA     infection nor to guide or     monitor treatment for     MRSA infections.     Studies: Ct Head Wo Contrast  02/27/2013   *RADIOLOGY REPORT*  Clinical Data: Altered mental status.  CT HEAD WITHOUT CONTRAST  Technique:  Contiguous axial images were obtained from the base of the skull through the vertex without contrast.  Comparison: No priors.  Findings: Moderate cerebral and mild cerebellar atrophy.  Patchy areas of decreased attenuation throughout the deep and periventricular white matter of the cerebral hemispheres bilaterally are compatible with chronic microvascular ischemic disease. No acute intracranial abnormalities.  Specifically, no evidence of acute intracranial hemorrhage, no definite findings of acute/subacute cerebral ischemia, no mass, mass effect, hydrocephalus or abnormal intra or  extra-axial fluid collections. Visualized paranasal sinuses and mastoids are generally well pneumatized, with the exception of some mild mucosal thickening throughout the inferior aspect of the maxillary sinuses bilaterally.  No acute displaced  skull fractures are identified.  IMPRESSION: 1.  No acute intracranial abnormalities. 2.  Moderate cerebral and mild cerebellar atrophy with chronic microvascular ischemic disease in the cerebral white matter, as above. 3.  Mild paranasal sinus disease, as above.   Original Report Authenticated By: Trudie Reed, M.D.   Dg Chest Port 1 View  02/27/2013   *RADIOLOGY REPORT*  Clinical Data: Altered mental status.  PORTABLE CHEST - 1 VIEW  Comparison: Chest x-ray 01/03/2013.  Findings: The lung volumes are low.  Bibasilar opacities favored to reflect subsegmental atelectasis.  No definite acute consolidative airspace disease.  No definite pleural effusions.  Crowding of the pulmonary vasculature, accentuated by low lung volumes.  No definite pulmonary edema.  Heart size is within normal limits. The patient is rotated to the left on today's exam, resulting in distortion of the mediastinal contours and reduced diagnostic sensitivity and specificity for mediastinal pathology. Atherosclerosis in the thoracic aorta.  Status post median sternotomy.  IMPRESSION: 1.  Low lung volumes with probable bibasilar subsegmental atelectasis. 2.  Atherosclerosis.   Original Report Authenticated By: Trudie Reed, M.D.    Scheduled Meds: . aspirin EC  81 mg Oral q morning - 10a  . calcium citrate  2 tablet Oral Daily  . cefTAZidime (FORTAZ)  IV  1 g Intravenous Q24H  . cholecalciferol  2,000 Units Oral Daily  . enoxaparin (LOVENOX) injection  30 mg Subcutaneous Q24H  . escitalopram  10 mg Oral Daily  . fluticasone  2 spray Each Nare Daily  . leflunomide  10 mg Oral Daily  . metoprolol tartrate  25 mg Oral BID  . pantoprazole  40 mg Oral Daily  . predniSONE  5 mg Oral Q  breakfast  . sodium chloride  3 mL Intravenous Q12H  . tamsulosin  0.4 mg Oral QPC supper   Continuous Infusions: . sodium chloride 75 mL/hr at 03/01/13 1015    Principal Problem:   Acute encephalopathy Active Problems:   COPD (chronic obstructive pulmonary disease)   CKD (chronic kidney disease) stage 3, GFR 30-59 ml/min   UTI (lower urinary tract infection)    Time spent: >35 minutes    Penny Pia  [facility] Pager [phone]. If 7PM-7AM, please contact night-coverage at [email or web address], password [facility] 03/01/2013, 2:55 PM  LOS: 2 days

## 2013-03-01 NOTE — Progress Notes (Signed)
Speech Language Pathology Dysphagia Treatment Patient Details Name: [name] MRN: [id number] DOB: 1927/10/26 Today's Date: 03/01/2013 Time: 4098-1191 SLP Time Calculation (min): 15 min  Assessment / Plan / Recommendation Clinical Impression  Pt seen for diagnostic treatment given limited participation in yesterday's assessment. Today pt is willing to accept sips and large straws sips and does not demonstrate any evidence of aspiration, though respiraiton does sound congested. Pt has a history of recurrent pna. but otherwise there is no evidence suggesting it may be due tto aspiration. Pt refused solids and said "i just couldnt stand it". For now would recommend starting a Dys 3 (mech soft diet) and thin liquids with aspiration precautions. SLP will f/u to monitor for tolerance and possible MBS if pt will cooperate.     Diet Recommendation  Initiate / Change Diet: Dysphagia 3 (mechanical soft);Thin liquid    SLP Plan Continue with current plan of care   Pertinent Vitals/Pain NA   Swallowing Goals  SLP Swallowing Goals Patient will utilize recommended strategies during swallow to increase swallowing safety with: Moderate cueing Swallow Study Goal #2 - Progress: Progressing toward goal Goal #3: Pt. will cooperate in further assessment at bedside (and possibly with MBS) with various consistencies for diet to be initiated. Swallow Study Goal #3 - Progress: Progressing toward goal  General Temperature Spikes Noted: No Respiratory Status: Supplemental O2 delivered via (comment) Behavior/Cognition: Alert;Requires cueing;Uncooperative Oral Cavity - Dentition: Adequate natural dentition Patient Positioning: Upright in bed  Oral Cavity - Oral Hygiene Does patient have any of the following "at risk" factors?: Oxygen therapy - cannula, mask, simple oxygen devices Patient is AT RISK - Oral Care Protocol followed (see row info): Yes   Dysphagia Treatment Treatment focused on: Upgraded PO  texture trials;Patient/family/caregiver education Treatment Methods/Modalities: Skilled observation Patient observed directly with PO's: Yes Type of PO's observed: Dysphagia 3 (soft);Thin liquids Feeding: Needs assist Liquids provided via: Cup;Straw Type of cueing: Verbal Amount of cueing: Minimal   GO    Harlon Ditty, MA CCC-SLP [phone]  Claudine Mouton 03/01/2013, 10:43 AM

## 2013-03-01 NOTE — ED Provider Notes (Signed)
I saw and evaluated the patient, reviewed the resident's note and I agree with the findings and plan. Symptoms concerning for developing urosepsis.  Altered mental status.  Acute kidney injury.  Chest x-ray without pneumonia.  CT head without acute abnormality.  Patient with a severe penicillin allergy and therefore he'll be treated with cefepime.  This will need to be admitted likely to the step down unit.  Hospitalist consult.   Lyanne Co, MD 03/01/13 [phone]

## 2013-03-01 NOTE — Progress Notes (Signed)
ANTIBIOTIC CONSULT NOTE - INITIAL- Late Entry  Pharmacy Consult for Ceftaz Indication: UTI/Bacteremia  Allergies  Allergen Reactions  . Codeine Other (See Comments)     drives me crazy  . Hydrocodone Other (See Comments)    Makes patient hallucinate, cannot sleep  . Oxycodone Other (See Comments)    Makes patient hallucinate, cannot sleep  . Penicillins Hives  . Sulfa Antibiotics Other (See Comments)    Per [facility]    Patient Measurements: Height: 5' 6.14" (168 cm) Weight: 166 lb 6 oz (75.467 kg) IBW/kg (Calculated) : 64.12 Adjusted Body Weight: n/a  Vital Signs: Temp: 99.3 F (37.4 C) (06/04 0901) Temp src: Oral (06/04 0901) BP: 164/83 mmHg (06/04 0901) Pulse Rate: 92 (06/04 0901) Intake/Output from previous day: 06/03 0701 - 06/04 0700 In: 3520 [I.V.:3470; IV Piggyback:50] Out: 1625 [Urine:1625] Intake/Output from this shift:    Labs:  Recent Labs  02/27/13 1939 02/28/13 0600 03/01/13 0548  WBC 13.7* 10.0  --   HGB 11.0* 9.3*  --   PLT 181 171  --   CREATININE 3.39* 2.97* 2.65*   Estimated Creatinine Clearance: 19.5 ml/min (by C-G formula based on Cr of 2.65). No results found for this basename: VANCOTROUGH, VANCOPEAK, VANCORANDOM, GENTTROUGH, GENTPEAK, GENTRANDOM, TOBRATROUGH, TOBRAPEAK, TOBRARND, AMIKACINPEAK, AMIKACINTROU, AMIKACIN,  in the last 72 hours   Microbiology: Recent Results (from the past 720 hour(s))  CULTURE, BLOOD (ROUTINE X 2)     Status: None   Collection Time    02/27/13  8:45 PM      Result Value Range Status   Specimen Description BLOOD ARM RIGHT   Final   Special Requests BOTTLES DRAWN AEROBIC AND ANAEROBIC 10CC   Final   Culture  Setup Time 02/28/2013 02:14   Final   Culture     Final   Value: PROTEUS SPECIES     Note: Gram Stain Report Called to,Read Back By and Verified With: CASSIE WICKER ON 03/01/2013 AT 12:07A BY WILEJ   Report Status PENDING   Incomplete  CULTURE, BLOOD (ROUTINE X 2)     Status: None   Collection Time   02/27/13  8:53 PM      Result Value Range Status   Specimen Description BLOOD ARM LEFT   Final   Special Requests BOTTLES DRAWN AEROBIC ONLY 10CC   Final   Culture  Setup Time 02/28/2013 02:15   Final   Culture     Final   Value: PROTEUS SPECIES     Note: Gram Stain Report Called to,Read Back By and Verified With: CASSIE WICKER ON 03/01/2013 AT 12:07A BY WILEJ   Report Status PENDING   Incomplete  URINE CULTURE     Status: None   Collection Time    02/27/13  9:25 PM      Result Value Range Status   Specimen Description URINE, CATHETERIZED   Final   Special Requests NONE   Final   Culture  Setup Time 02/27/2013 22:02   Final   Colony Count >=100,000 COLONIES/ML   Final   Culture PROTEUS MIRABILIS   Final   Report Status PENDING   Incomplete  MRSA PCR SCREENING     Status: None   Collection Time    02/28/13 12:25 AM      Result Value Range Status   MRSA by PCR NEGATIVE  NEGATIVE Final   Comment:            The GeneXpert MRSA Assay (FDA     approved for NASAL  specimens     only), is one component of a     comprehensive MRSA colonization     surveillance program. It is not     intended to diagnose MRSA     infection nor to guide or     monitor treatment for     MRSA infections.    Medical History: Past Medical History  Diagnosis Date  . Hypertension   . Arthritis   . Ex-smoker   . Rheumatoid arthritis(714.0)   . Pulmonary nodule   . DDD (degenerative disc disease)   . Complication of anesthesia     " I am difficult to wake up "  . Chronic kidney disease     acute on chronic  . Anemia     Medications:  Scheduled:  . aspirin EC  81 mg Oral q morning - 10a  . calcium citrate  2 tablet Oral Daily  . cefTAZidime (FORTAZ)  IV  1 g Intravenous Q24H  . cholecalciferol  2,000 Units Oral Daily  . ciprofloxacin  400 mg Intravenous Q24H  . enoxaparin (LOVENOX) injection  30 mg Subcutaneous Q24H  . escitalopram  10 mg Oral Daily  . fluticasone  2 spray Each Nare Daily  .  leflunomide  10 mg Oral Daily  . metoprolol tartrate  25 mg Oral BID  . pantoprazole  40 mg Oral Daily  . predniSONE  5 mg Oral Q breakfast  . sodium chloride  3 mL Intravenous Q12H  . tamsulosin  0.4 mg Oral QPC supper   Assessment: 79 yo male with history of chronic UTI.  Now with blood cultures and UCx positive for proteus, sensitivites pending.  Started on ceftazidime and cipro.  Doses are appropriate for his estimated CrCl ~ 20 ml/min.  Goal of Therapy:  resolution of infection  Plan:  1. Continue Cipro and Ceftaz at current doses. 2. Will f/u culture results and renal function.  Tad Moore, BCPS  Clinical Pharmacist Pager [phone]  03/01/2013 1:26 PM

## 2013-03-01 NOTE — Clinical Social Work Note (Signed)
Consult received regarding patient from a skilled nursing facility - [facility]. CSW will follow-up with patient to confirm return to facility and will advise facility when patient ready for discharge.  Genelle Bal, MSW, LCSW [phone]

## 2013-03-01 NOTE — Progress Notes (Signed)
ANTIBIOTIC CONSULT NOTE  Pharmacy Consult for Ceftriaxone Indication: UTI/Bacteremia  Allergies  Allergen Reactions  . Codeine Other (See Comments)     drives me crazy  . Hydrocodone Other (See Comments)    Makes patient hallucinate, cannot sleep  . Oxycodone Other (See Comments)    Makes patient hallucinate, cannot sleep  . Penicillins Hives  . Sulfa Antibiotics Other (See Comments)    Per [facility]   Patient Measurements: Height: 5' 6.14" (168 cm) Weight: 166 lb 6 oz (75.467 kg) IBW/kg (Calculated) : 64.12  Vital Signs: Temp: 99.3 F (37.4 C) (06/04 0901) Temp src: Oral (06/04 0901) BP: 164/83 mmHg (06/04 0901) Pulse Rate: 92 (06/04 0901) Intake/Output from previous day: 06/03 0701 - 06/04 0700 In: 3520 [I.V.:3470; IV Piggyback:50] Out: 1625 [Urine:1625] Intake/Output from this shift:  Recent Labs  02/27/13 1939 02/28/13 0600 03/01/13 0548  WBC 13.7* 10.0  --   HGB 11.0* 9.3*  --   PLT 181 171  --   CREATININE 3.39* 2.97* 2.65*   Estimated Creatinine Clearance: 19.5 ml/min (by C-G formula based on Cr of 2.65).  Microbiology: Recent Results (from the past 720 hour(s))  CULTURE, BLOOD (ROUTINE X 2)     Status: None   Collection Time    02/27/13  8:45 PM      Result Value Range Status   Specimen Description BLOOD ARM RIGHT   Final   Special Requests BOTTLES DRAWN AEROBIC AND ANAEROBIC 10CC   Final   Culture  Setup Time 02/28/2013 02:14   Final   Culture     Final   Value: PROTEUS SPECIES     Note: Gram Stain Report Called to,Read Back By and Verified With: CASSIE WICKER ON 03/01/2013 AT 12:07A BY WILEJ   Report Status PENDING   Incomplete  CULTURE, BLOOD (ROUTINE X 2)     Status: None   Collection Time    02/27/13  8:53 PM      Result Value Range Status   Specimen Description BLOOD ARM LEFT   Final   Special Requests BOTTLES DRAWN AEROBIC ONLY 10CC   Final   Culture  Setup Time 02/28/2013 02:15   Final   Culture     Final   Value: PROTEUS SPECIES     Note:  Gram Stain Report Called to,Read Back By and Verified With: CASSIE WICKER ON 03/01/2013 AT 12:07A BY WILEJ   Report Status PENDING   Incomplete  URINE CULTURE     Status: None   Collection Time    02/27/13  9:25 PM      Result Value Range Status   Specimen Description URINE, CATHETERIZED   Final   Special Requests NONE   Final   Culture  Setup Time 02/27/2013 22:02   Final   Colony Count >=100,000 COLONIES/ML   Final   Culture PROTEUS MIRABILIS   Final   Report Status 03/01/2013 FINAL   Final   Organism ID, Bacteria PROTEUS MIRABILIS   Final  MRSA PCR SCREENING     Status: None   Collection Time    02/28/13 12:25 AM      Result Value Range Status   MRSA by PCR NEGATIVE  NEGATIVE Final   Comment:            The GeneXpert MRSA Assay (FDA     approved for NASAL specimens     only), is one component of a     comprehensive MRSA colonization     surveillance program. It  is not     intended to diagnose MRSA     infection nor to guide or     monitor treatment for     MRSA infections.   Medical History: Past Medical History  Diagnosis Date  . Hypertension   . Arthritis   . Ex-smoker   . Rheumatoid arthritis(714.0)   . Pulmonary nodule   . DDD (degenerative disc disease)   . Complication of anesthesia     " I am difficult to wake up "  . Chronic kidney disease     acute on chronic  . Anemia    Medications:  Scheduled:  . aspirin EC  81 mg Oral q morning - 10a  . calcium citrate  2 tablet Oral Daily  . cholecalciferol  2,000 Units Oral Daily  . enoxaparin (LOVENOX) injection  30 mg Subcutaneous Q24H  . escitalopram  10 mg Oral Daily  . fluticasone  2 spray Each Nare Daily  . leflunomide  10 mg Oral Daily  . metoprolol tartrate  25 mg Oral BID  . pantoprazole  40 mg Oral Daily  . predniSONE  5 mg Oral Q breakfast  . sodium chloride  3 mL Intravenous Q12H  . tamsulosin  0.4 mg Oral QPC supper   Assessment: 79 yo male with history of chronic UTI.  Now with blood cultures  and UCx positive for proteus, sensitive to Ceftriaxone.  We have been asked to de-escalate his IV antibiotics.  He has no noted allergy to this antibiotic and we will use standard dosing for him.    Goal of Therapy:  resolution of infection  Plan:  1. Change IV antibiotics to Ceftriaxone 1gm IV q24 hours. 2. Monitor s/s of infection and resolution.  Nadara Mustard, PharmD., MS Clinical Pharmacist Pager:  [phone] Thank you for allowing pharmacy to be part of this patients care team.  03/01/2013 4:10 PM

## 2013-03-01 NOTE — Progress Notes (Signed)
Physical Therapy Evaluation Patient Details Name: [name] MRN: [id number] DOB: 1928-02-08 Today's Date: 03/01/2013 Time: 1530-1600 PT Time Calculation (min): 30 min  PT Assessment / Plan / Recommendation Clinical Impression  Pt is 79 yo male with encephalopathy who presents with generalized weakness and increased confusion but was able to ambulate 5' with RW and min A. PT will follow acutely to advance mobility and help pt gain strength and independence. Recommend return to SNF for rehab at d/c.    PT Assessment  Patient needs continued PT services    Follow Up Recommendations  Supervision/Assistance - 24 hour;SNF    Does the patient have the potential to tolerate intense rehabilitation      Barriers to Discharge None      Equipment Recommendations  None recommended by PT    Recommendations for Other Services OT consult   Frequency Min 2X/week    Precautions / Restrictions Precautions Precautions: Fall Precaution Comments: left eye almost fully closed, little to no vision Restrictions Weight Bearing Restrictions: No   Pertinent Vitals/Pain No c/o pain, O2 sats 88% on RA, O2 reapplied      Mobility  Bed Mobility Bed Mobility: Supine to Sit;Sitting - Scoot to Edge of Bed Supine to Sit: 4: Min assist;With rails Sitting - Scoot to [place] of Bed: 4: Min assist Details for Bed Mobility Assistance: first cued pt to get up on his own and he thought he could but could not problem solve the task. With vc's he was able to roll onto his left side and sit up with min A to transfer wt from left hip to right Transfers Transfers: Sit to Stand;Stand to Sit Sit to Stand: From bed;From chair/3-in-1;With upper extremity assist;3: Mod assist;4: Min assist Stand to Sit: 4: Min assist;To chair/3-in-1;With upper extremity assist Details for Transfer Assistance: first sit to stand needed mod A and had difficulty achieving erect posture. Second transfer from chair, pt was able to stand with  min A to steady Ambulation/Gait Ambulation/Gait Assistance: 4: Min assist Ambulation Distance (Feet): 5 Feet Assistive device: Rolling walker Ambulation/Gait Assistance Details: when first attempted ambulation, pt could not lift left foot to step at all. After sitting and doing some ROM exercises with the LLE, stood back up and pt was able to step the left foot with vc's for sequencing mvmt of RW and stepping each foot. Pt ambulated 5' fwd and 3' bkwd Gait Pattern: Step-to pattern;Decreased stride length;Trunk flexed;Left foot flat;Decreased weight shift to left Gait velocity: very slow Stairs: No Wheelchair Mobility Wheelchair Mobility: No    Exercises General Exercises - Lower Extremity Long Arc Quad: AROM;Left;5 reps;Seated Hip Flexion/Marching: AROM;Left;5 reps;Seated   PT Diagnosis: Difficulty walking;Abnormality of gait;Generalized weakness  PT Problem List: Decreased strength;Decreased activity tolerance;Decreased balance;Decreased mobility;Decreased coordination;Decreased range of motion;Decreased cognition;Decreased knowledge of use of DME;Decreased safety awareness;Decreased knowledge of precautions PT Treatment Interventions: DME instruction;Gait training;Functional mobility training;Therapeutic activities;Therapeutic exercise;Balance training;Cognitive remediation;Neuromuscular re-education;Patient/family education   PT Goals Acute Rehab PT Goals PT Goal Formulation: With patient Time For Goal Achievement: 03/15/13 Potential to Achieve Goals: Good Pt will go Supine/Side to Sit: with supervision PT Goal: Supine/Side to Sit - Progress: Goal set today Pt will go Sit to Supine/Side: with supervision PT Goal: Sit to Supine/Side - Progress: Goal set today Pt will go Sit to Stand: with supervision PT Goal: Sit to Stand - Progress: Goal set today Pt will Ambulate: 51 - 150 feet;with supervision;with rolling walker PT Goal: Ambulate - Progress: Goal set today  Visit  Information   Last PT Received On: 03/01/13 Assistance Needed: +1    Subjective Data  Subjective: pt says he doesn't think he had a stroke but left side is weak Patient Stated Goal: none stated   Prior Functioning  Home Living Lives With: Other (Comment) (SNF) Available Help at Discharge: Skilled Nursing Facility Type of Home: Skilled Nursing Facility Home Adaptive Equipment: Walker - rolling;Other (comment) (oxygen) Additional Comments: pt reports he has been on oxygen at SNF since he had PNA, he says he has not been walking or receiving therapy but is not a reliable historian Prior Function Level of Independence: Needs assistance Able to Take Stairs?: No Driving: No Vocation: Retired Musician: HOH;Expressive difficulties    Cognition  Cognition Arousal/Alertness: Awake/alert Behavior During Therapy: WFL for tasks assessed/performed Overall Cognitive Status: Impaired/Different from baseline Area of Impairment: Memory Memory: Decreased short-term memory General Comments: per chart, ot is more confused than his baseline and cognition has been fluctuating. He is able to give me some history but with no specific timeline or details    Extremity/Trunk Assessment Right Upper Extremity Assessment RUE ROM/Strength/Tone: [facility] for tasks assessed RUE Sensation: WFL - Light Touch RUE Coordination: WFL - gross motor Left Upper Extremity Assessment LUE ROM/Strength/Tone: Deficits LUE ROM/Strength/Tone Deficits: >3/5, decreased grip strength and difficulty releasing with the left hand LUE Sensation: Deficits LUE Sensation Deficits: decreased sensation since an event in Dec  LUE Coordination: Deficits Right Lower Extremity Assessment RLE ROM/Strength/Tone: Deficits RLE ROM/Strength/Tone Deficits: generalized weakness noted, grossly 4-/5 RLE Sensation: WFL - Light Touch;WFL - Proprioception RLE Coordination: WFL - gross motor Left Lower Extremity Assessment LLE ROM/Strength/Tone:  Deficits LLE ROM/Strength/Tone Deficits: considerably weaker than right LE, hip flex 2/5, knee ext 3/5 LLE Sensation: Deficits LLE Sensation Deficits: decreased to lt touch LLE Coordination: Deficits LLE Coordination Deficits: poor coordination of left side Trunk Assessment Trunk Assessment: Kyphotic   Balance Balance Balance Assessed: Yes Static Standing Balance Static Standing - Balance Support: Bilateral upper extremity supported;During functional activity Static Standing - Level of Assistance: 4: Min assist  End of Session PT - End of Session Equipment Utilized During Treatment: Gait belt Activity Tolerance: Patient tolerated treatment well Patient left: in chair;with call bell/phone within reach;with chair alarm set Nurse Communication: Mobility status  GP   Lyanne Co, PT  Acute Rehab Services  [phone]   Lyanne Co 03/01/2013, 4:54 PM

## 2013-03-02 DIAGNOSIS — A4159 Other Gram-negative sepsis: Principal | ICD-10-CM | POA: Diagnosis present

## 2013-03-02 LAB — CULTURE, BLOOD (ROUTINE X 2)

## 2013-03-02 LAB — BASIC METABOLIC PANEL
CO2: 29 mEq/L (ref 19–32)
Chloride: 106 mEq/L (ref 96–112)
GFR calc Af Amer: 32 mL/min — ABNORMAL LOW (ref >90)
Potassium: 3.5 mEq/L (ref 3.5–5.1)
Sodium: 142 mEq/L (ref 135–145)

## 2013-03-02 MED ORDER — TRAMADOL HCL 50 MG PO TABS: 50.0000 mg | ORAL_TABLET | Freq: Four times a day (QID) | ORAL | Status: DC | PRN

## 2013-03-02 MED ORDER — CEFUROXIME AXETIL 500 MG PO TABS: 500.0000 mg | ORAL_TABLET | Freq: Two times a day (BID) | ORAL | Status: DC

## 2013-03-02 NOTE — Progress Notes (Signed)
Speech Language Pathology Dysphagia Treatment Patient Details Name: [name] MRN: [id number] DOB: 1928-01-16 Today's Date: 03/02/2013 Time: 7829-5621 SLP Time Calculation (min): 24 min  Assessment / Plan / Recommendation Clinical Impression  Pt with improved mood and willingness to participate today.  Consumed regular, pureed, and thin liquids with occasional, intermittent cough noted thoughout trials, but also at baseline and after POs.   Cough is congested. DIscussed breathing rate and coordination of swallow/respiration, the importance of taking rest breaks when eating and the potential for aspiration.  Pt verbalizes understanding but has difficulty recalling details five min after discussion. Recommend continuing Dysphagia 3 diet with thin liquids and aspiration precautions.  WIll follow for toleration and to determine if further instrumental testing would be of value.    Diet Recommendation  Continue with Current Diet: Dysphagia 3 (mechanical soft);Thin liquid    SLP Plan Continue with current plan of care   Pertinent Vitals/Pain No pain   Swallowing Goals  SLP Swallowing Goals Swallow Study Goal #2 - Progress: Progressing toward goal Goal #3: Pt. will cooperate in further assessment at bedside (and possibly with MBS) with various consistencies for diet to be initiated. Swallow Study Goal #3 - Progress: Progressing toward goal  General Temperature Spikes Noted: No Respiratory Status: Supplemental O2 delivered via (comment) Behavior/Cognition: Alert;Cooperative Oral Cavity - Dentition: Adequate natural dentition Patient Positioning: Upright in bed  Oral Cavity - Oral Hygiene Does patient have any of the following "at risk" factors?: Oxygen therapy - cannula, mask, simple oxygen devices Patient is AT RISK - Oral Care Protocol followed (see row info): Yes   Dysphagia Treatment Treatment focused on: Skilled observation of diet tolerance;Patient/family/caregiver  education Treatment Methods/Modalities: Skilled observation Patient observed directly with PO's: Yes Type of PO's observed: Dysphagia 3 (soft);Thin liquids Feeding: Needs assist Liquids provided via: Cup;Straw Pharyngeal Phase Signs & Symptoms: Delayed cough Type of cueing: Verbal Amount of cueing: Minimal   GO     Carolan Shiver 03/02/2013, 12:05 PM

## 2013-03-02 NOTE — Progress Notes (Signed)
Pt had a few episodes of non sustain vtach with heart rate in the 140's-150's while pt complaining of pain in his bilateral lower ext's. Vital signs were stable.  MD was on the unit and notified. No new orders received. Will cont to monitor.

## 2013-03-02 NOTE — Clinical Social Work Note (Addendum)
3:35PM- CSW received a call from [facility], daughter who states she does wish for pt to return to [place].  CSW is handing off to covering CSW, [place].  Nelva Bush will assist with contacting [facility] for permission for pt to return today and with transportation.  D/c packet on chart with PTAR form.   3:16PM- CSW called Marylu Lund, daughter and left message.    3:13PM - CSW received a cell phone number for daughter, [name].  CSW called and left message on cell phone for daughter, [name] to confirm that pt will be returning back to [place] upon d/c.  CSW awaiting a return call.  CSW called [facility] to confirm pt can return today.  CSW left message with Bjorn Loser.  CSW needs to confirm   11:59AM CSW received consult for pt to return to [place].  CSW attempted to confirm this with pt daughter, [name] [phone].  No message was left no voice mail picked up.  CSW will continue attempts.  FL2 has been signed by MD.  Vickii Penna, LCSWA [phone]  Clinical Social Work

## 2013-03-02 NOTE — Discharge Summary (Signed)
Physician Discharge Summary  [name] ZOX:[id number] DOB: Jul 13, 1928 DOA: 02/27/2013  PCP: [name], MD  Admit date: 02/27/2013 Discharge date: 03/02/2013  Time spent: 40 minutes  Recommendations for Outpatient Follow-up:  1. Followup with primary care physician in 2 weeks  Discharge Diagnoses:  Principal Problem:   Proteus septicemia Active Problems:   COPD (chronic obstructive pulmonary disease)   CKD (chronic kidney disease) stage 3, GFR 30-59 ml/min   Acute encephalopathy   UTI (lower urinary tract infection)   Discharge Condition: Stable  Diet recommendation: Dysphagia 3 diet with thin liquids  Filed Weights   02/28/13 0012 02/28/13 2027 03/01/13 2027  Weight: 75.467 kg (166 lb 6 oz) 75.467 kg (166 lb 6 oz) 73.074 kg (161 lb 1.6 oz)    History of present illness:  Patient is 79 year old male with history of hypertension, chronic kidney disease, history of recurrent UTIs, recent pneumonia who is currently in a skilled nursing facility presented to ED with confusion. History was obtained from the patient's daughter present in the room. Patient is awake but unable to provide much history due to confusion. Per daughter, patient was noticed to be more confused, sleepy since Saturday. Patient usually gets confused when he has urinary tract infection. Otherwise he did not voice any complaints. Per daughter patient's facility did not mention any fevers or any other symptoms to her. He recently had pneumonia about a week and a half ago and has some residual nonproductive coughing for last several days.  Hospital Course:   1. Sepsis/septicemia: Proteus mirabilis septicemia secondary to UTI, patient initially after admitted to the hospital started on broad-spectrum antibiotics on Fortaz and ciprofloxacin. Patient has allergic reaction to penicillins. That did control his fever and leukocytosis gone down. His urine culture as well as blood culture showed Proteus mirabilis. Susceptible  to ampicillin and ceftriaxone. Patient discharged home on Ceftin for 14 more days.  2. UTI: Causing #1, as mentioned above Ceftin for 14 more days.  3. Acute metabolic encephalopathy: His acute confusional state and lethargy is secondary to his septicemia/UTI. This is resolved now, patient awake and alert but I think he is back to his baseline.  4. Acute on chronic renal failure: His baseline creatinine is about 1.6, patient presented to the hospital with creatinine of 3.4. Hydration with IV fluids his creatinine went down to 2.1. The creatinine is trending downwards, his BMP needs to be checked in one week.  5. Leukocytosis: Presented with WBC of 13.7, this is resolved after initiation of antibiotics. The leukocytosis likely secondary to his septicemia/UTI.  6. Disposition: Patient is a nursing home secondary to recent pneumonia, patient seen by PT/OT and SLP N. recommended to go back to his SNF  Procedures:  None  Consultations:  None  Discharge Exam: Filed Vitals:   03/01/13 2027 03/01/13 2238 03/02/13 0423 03/02/13 1000  BP: 173/69  148/86 142/68  Pulse: 103 82 90 73  Temp: 98.2 F (36.8 C)  98.2 F (36.8 C) 98 F (36.7 C)  TempSrc: Oral  Oral Oral  Resp: 22  20 20   Height: 5' 6.14" (1.68 m)     Weight: 73.074 kg (161 lb 1.6 oz)     SpO2: 97% 97% 94% 97%   General: Alert and awake, oriented x3, not in any acute distress. HEENT: anicteric sclera, pupils reactive to light and accommodation, EOMI CVS: S1-S2 clear, no murmur rubs or gallops Chest: clear to auscultation bilaterally, no wheezing, rales or rhonchi Abdomen: soft nontender, nondistended, normal bowel sounds,  no organomegaly Extremities: no cyanosis, clubbing or edema noted bilaterally Neuro: Cranial nerves II-XII intact, no focal neurological deficits  Discharge Instructions  Discharge Orders   Future Orders Complete By Expires     Diet - low sodium heart healthy  As directed     Increase activity slowly   As directed         Medication List    TAKE these medications       albuterol (2.5 MG/3ML) 0.083% nebulizer solution  Commonly known as:  PROVENTIL  Take 2.5 mg by nebulization every 4 (four) hours as needed. For shortness of breath     alendronate 70 MG tablet  Commonly known as:  FOSAMAX  Take 70 mg by mouth every 7 (seven) days. Take with a full glass of water on an empty stomach.  On Friday     ALIGN 4 MG Caps  Take 1 capsule by mouth every morning.     aspirin EC 81 MG tablet  Take 81 mg by mouth every morning.     calcium citrate 950 MG tablet  Commonly known as:  CALCITRATE - dosed in mg elemental calcium  Take 2 tablets by mouth daily.     cefUROXime 500 MG tablet  Commonly known as:  CEFTIN  Take 1 tablet (500 mg total) by mouth 2 (two) times daily.     escitalopram 10 MG tablet  Commonly known as:  LEXAPRO  Take 10 mg by mouth daily.     flunisolide 29 MCG/ACT (0.025%) nasal spray  Commonly known as:  NASAREL  Place 2 sprays into the nose 2 (two) times daily. Dose is for each nostril.     furosemide 20 MG tablet  Commonly known as:  LASIX  Take 20 mg by mouth 2 (two) times daily.     gabapentin 100 MG capsule  Commonly known as:  NEURONTIN  Take 200 mg by mouth 2 (two) times daily.     HEMATINIC PLUS VIT/MINERALS PO  Take 1 tablet by mouth daily.     leflunomide 10 MG tablet  Commonly known as:  ARAVA  Take 10 mg by mouth daily.     metoprolol tartrate 25 MG tablet  Commonly known as:  LOPRESSOR  Take 25 mg by mouth 2 (two) times daily.     omeprazole 20 MG capsule  Commonly known as:  PRILOSEC  Take 20 mg by mouth daily.     predniSONE 5 MG tablet  Commonly known as:  DELTASONE  Take 5 mg by mouth daily.     SUPER B COMPLEX/C Caps  Take 1 capsule by mouth daily.     tamsulosin 0.4 MG Caps  Commonly known as:  FLOMAX  Take 1 capsule (0.4 mg total) by mouth daily after supper.     traMADol 50 MG tablet  Commonly known as:  ULTRAM   Take 1 tablet (50 mg total) by mouth every 6 (six) hours as needed for pain. For pain     trimethoprim 100 MG tablet  Commonly known as:  TRIMPEX  Take 100 mg by mouth daily.     Vitamin D 2000 UNITS tablet  Take 2,000 Units by mouth every morning.       Allergies  Allergen Reactions  . Codeine Other (See Comments)     drives me crazy  . Hydrocodone Other (See Comments)    Makes patient hallucinate, cannot sleep  . Oxycodone Other (See Comments)    Makes patient hallucinate, cannot sleep  .  Penicillins Hives  . Sulfa Antibiotics Other (See Comments)    Per MAR      The results of significant diagnostics from this hospitalization (including imaging, microbiology, ancillary and laboratory) are listed below for reference.    Significant Diagnostic Studies: Ct Head Wo Contrast  02/27/2013   *RADIOLOGY REPORT*  Clinical Data: Altered mental status.  CT HEAD WITHOUT CONTRAST  Technique:  Contiguous axial images were obtained from the base of the skull through the vertex without contrast.  Comparison: No priors.  Findings: Moderate cerebral and mild cerebellar atrophy.  Patchy areas of decreased attenuation throughout the deep and periventricular white matter of the cerebral hemispheres bilaterally are compatible with chronic microvascular ischemic disease. No acute intracranial abnormalities.  Specifically, no evidence of acute intracranial hemorrhage, no definite findings of acute/subacute cerebral ischemia, no mass, mass effect, hydrocephalus or abnormal intra or extra-axial fluid collections. Visualized paranasal sinuses and mastoids are generally well pneumatized, with the exception of some mild mucosal thickening throughout the inferior aspect of the maxillary sinuses bilaterally.  No acute displaced skull fractures are identified.  IMPRESSION: 1.  No acute intracranial abnormalities. 2.  Moderate cerebral and mild cerebellar atrophy with chronic microvascular ischemic disease in the  cerebral white matter, as above. 3.  Mild paranasal sinus disease, as above.   Original Report Authenticated By: Trudie Reed, M.D.   Dg Chest Port 1 View  02/27/2013   *RADIOLOGY REPORT*  Clinical Data: Altered mental status.  PORTABLE CHEST - 1 VIEW  Comparison: Chest x-ray 01/03/2013.  Findings: The lung volumes are low.  Bibasilar opacities favored to reflect subsegmental atelectasis.  No definite acute consolidative airspace disease.  No definite pleural effusions.  Crowding of the pulmonary vasculature, accentuated by low lung volumes.  No definite pulmonary edema.  Heart size is within normal limits. The patient is rotated to the left on today's exam, resulting in distortion of the mediastinal contours and reduced diagnostic sensitivity and specificity for mediastinal pathology. Atherosclerosis in the thoracic aorta.  Status post median sternotomy.  IMPRESSION: 1.  Low lung volumes with probable bibasilar subsegmental atelectasis. 2.  Atherosclerosis.   Original Report Authenticated By: Trudie Reed, M.D.    Microbiology: Recent Results (from the past 240 hour(s))  CULTURE, BLOOD (ROUTINE X 2)     Status: None   Collection Time    02/27/13  8:45 PM      Result Value Range Status   Specimen Description BLOOD ARM RIGHT   Final   Special Requests BOTTLES DRAWN AEROBIC AND ANAEROBIC 10CC   Final   Culture  Setup Time 02/28/2013 02:14   Final   Culture     Final   Value: PROTEUS MIRABILIS     Note: Gram Stain Report Called to,Read Back By and Verified With: CASSIE WICKER ON 03/01/2013 AT 12:07A BY WILEJ   Report Status 03/02/2013 FINAL   Final   Organism ID, Bacteria PROTEUS MIRABILIS   Final  CULTURE, BLOOD (ROUTINE X 2)     Status: None   Collection Time    02/27/13  8:53 PM      Result Value Range Status   Specimen Description BLOOD ARM LEFT   Final   Special Requests BOTTLES DRAWN AEROBIC ONLY 10CC   Final   Culture  Setup Time 02/28/2013 02:15   Final   Culture     Final    Value: PROTEUS MIRABILIS     Note: SUSCEPTIBILITIES PERFORMED ON PREVIOUS CULTURE WITHIN THE LAST 5 DAYS.  Note: Gram Stain Report Called to,Read Back By and Verified With: CASSIE WICKER ON 03/01/2013 AT 12:07A BY WILEJ   Report Status 03/02/2013 FINAL   Final  URINE CULTURE     Status: None   Collection Time    02/27/13  9:25 PM      Result Value Range Status   Specimen Description URINE, CATHETERIZED   Final   Special Requests NONE   Final   Culture  Setup Time 02/27/2013 22:02   Final   Colony Count >=100,000 COLONIES/ML   Final   Culture PROTEUS MIRABILIS   Final   Report Status 03/01/2013 FINAL   Final   Organism ID, Bacteria PROTEUS MIRABILIS   Final  MRSA PCR SCREENING     Status: None   Collection Time    02/28/13 12:25 AM      Result Value Range Status   MRSA by PCR NEGATIVE  NEGATIVE Final   Comment:            The GeneXpert MRSA Assay (FDA     approved for NASAL specimens     only), is one component of a     comprehensive MRSA colonization     surveillance program. It is not     intended to diagnose MRSA     infection nor to guide or     monitor treatment for     MRSA infections.     Labs: Basic Metabolic Panel:  Recent Labs Lab 02/27/13 1939 02/28/13 0600 03/01/13 0548 03/02/13 0640  NA 138 140 145 142  K 4.4 4.0 3.6 3.5  CL 96 105 109 106  CO2 27 25 25 29   GLUCOSE 150* 108* 84 103*  BUN 57* 49* 42* 35*  CREATININE 3.39* 2.97* 2.65* 2.12*  CALCIUM 10.9* 9.0 9.3 8.9  MG  --   --  1.7  --    Liver Function Tests:  Recent Labs Lab 02/27/13 1939  AST 19  ALT 13  ALKPHOS 109  BILITOT 0.5  PROT 7.0  ALBUMIN 2.9*   No results found for this basename: LIPASE, AMYLASE,  in the last 168 hours No results found for this basename: AMMONIA,  in the last 168 hours CBC:  Recent Labs Lab 02/27/13 1939 02/28/13 0600  WBC 13.7* 10.0  NEUTROABS 10.7*  --   HGB 11.0* 9.3*  HCT 34.0* 27.9*  MCV 89.9 89.1  PLT 181 171   Cardiac Enzymes: No results  found for this basename: CKTOTAL, CKMB, CKMBINDEX, TROPONINI,  in the last 168 hours BNP: BNP (last 3 results)  Recent Labs  03/06/12 1505 06/05/12 0524 01/03/13 0552  PROBNP 173.7 506.6* 602.9*   CBG: No results found for this basename: GLUCAP,  in the last 168 hours     Signed:  Loyd Salvador A  [facility] 03/02/2013, 12:35 PM

## 2013-03-02 NOTE — Progress Notes (Signed)
Clinical Child psychotherapist (CSW) informed that pt ready for discharge back to [place]. CSW received confirmation from [facility] that pt is okay to transfer. Prepared dc packet has been placed in pt shadow chart, [facility] has been called for a non emergency ambulance and pt daughter [name] has been notified of dc. No concerns addressed, CSW signing off.  Theresia Bough, MSW, Theresia Majors [phone]

## 2013-03-03 NOTE — Progress Notes (Signed)
Date: 03/03/2013  MRN:  [id number] Name:  [name] Sex:  male Age:  79 y.o. DOB:1927/12/30                      Facility/Room; [facility] Level Of Care: Provider: Dr. Murray Hodgkins  Emergency Contacts: Contact Information   Name Relation Home Work Mobile   Gutierrez,[name] Daughter [phone]  [phone]   Jordan,Janet Daughter [phone]        Code Status:Full MOST Form:  Allergies: Allergies  Allergen Reactions  . Codeine Other (See Comments)     drives me crazy  . Hydrocodone Other (See Comments)    Makes patient hallucinate, cannot sleep  . Oxycodone Other (See Comments)    Makes patient hallucinate, cannot sleep  . Penicillins Hives  . Sulfa Antibiotics Other (See Comments)    Per [facility]     Chief Complaint  Patient presents with  . Medical Managment of Chronic Issues    Re-admit to SNF following hospitlalization for proteus septicemia     HPI: Patient is 79 year old male with history of hypertension, chronic kidney disease, history of recurrent UTIs, recent pneumonia who is currently in a skilled nursing facility presented to ED 02/27/13, with confusion. History was obtained from the patient's daughter present in the room. Patient is awake but unable to provide much history due to confusion. Per daughter, patient was noticed to be more confused, sleepy since Saturday. Patient usually gets confused when he has urinary tract infection. Otherwise he did not voice any complaints. Per daughter patient's facility did not mention any fevers or any other symptoms to her. He recently had pneumonia about a week and a half ago and has some residual nonproductive coughing for last several days. Proteus mirabilis septicemia secondary to UTI, patient initially after admitted to the hospital started on broad-spectrum antibiotics on Fortaz and ciprofloxacin. Patient has allergic reaction to penicillins. That did control his fever and leukocytosis gone down. His urine culture as well  as blood culture showed Proteus mirabilis. Susceptible to ampicillin and ceftriaxone. Patient discharged to nursing home on Ceftin for 14 more days. His acute confusional state and lethargy is secondary to his septicemia/UTI. This is resolved now, patient awake and alert but I think he is back to his baseline. 03/02/2013 was discharged back to [facility].    Past Medical History  Diagnosis Date  . Hypertension   . Arthritis   . Ex-smoker   . Rheumatoid arthritis(714.0)   . Pulmonary nodule   . DDD (degenerative disc disease)   . Complication of anesthesia     " I am difficult to wake up "  . Chronic kidney disease     acute on chronic  . Anemia     Past Surgical History  Procedure Laterality Date  . Joint replacement      bilateral knee replacement  . Hernia repair    . Back surgery      X 2  . Appendectomy       Procedures: 02/27/2013  CT HEAD WITHOUT CONTRAST IMPRESSION: 1. No acute intracranial abnormalities. 2. Moderate cerebral and mild cerebellar atrophy with chronic microvascular ischemic disease in the cerebral white matter, as above. 3. Mild paranasal sinus disease, as above..  02/27/2013 . PORTABLE CHEST - 1 VIEW Comparison: Chest x-ray 01/03/2013 . IMPRESSION: 1. Low lung volumes with probable bibasilar subsegmental atelectasis. 2. Atherosclerosis. .   Consultants:  Dr. [name] PCP  Current Outpatient Prescriptions  Medication Sig Dispense  Refill  . albuterol (PROVENTIL) (2.5 MG/3ML) 0.083% nebulizer solution Take 2.5 mg by nebulization every 4 (four) hours as needed. For shortness of breath      . alendronate (FOSAMAX) 70 MG tablet Take 70 mg by mouth every 7 (seven) days. Take with a full glass of water on an empty stomach.  On Friday      . aspirin EC 81 MG tablet Take 81 mg by mouth every morning.       . calcium citrate (CALCITRATE - DOSED IN MG ELEMENTAL CALCIUM) 950 MG tablet Take 2 tablets by mouth daily.      . cefUROXime (CEFTIN) 500 MG tablet Take 1  tablet (500 mg total) by mouth 2 (two) times daily.  28 tablet  0  . Cholecalciferol (VITAMIN D) 2000 UNITS tablet Take 2,000 Units by mouth every morning.       . escitalopram (LEXAPRO) 10 MG tablet Take 10 mg by mouth daily.      . Fe Fum-FA-B Cmp-C-Zn-Mg-Mn-Cu (HEMATINIC PLUS VIT/MINERALS PO) Take 1 tablet by mouth daily.      . flunisolide (NASAREL) 29 MCG/ACT (0.025%) nasal spray Place 2 sprays into the nose 2 (two) times daily. Dose is for each nostril.      . furosemide (LASIX) 20 MG tablet Take 20 mg by mouth 2 (two) times daily.      Marland Kitchen gabapentin (NEURONTIN) 100 MG capsule Take 200 mg by mouth 2 (two) times daily.      Marland Kitchen leflunomide (ARAVA) 10 MG tablet Take 10 mg by mouth daily.      . metoprolol tartrate (LOPRESSOR) 25 MG tablet Take 25 mg by mouth 2 (two) times daily.      Marland Kitchen omeprazole (PRILOSEC) 20 MG capsule Take 20 mg by mouth daily.       . predniSONE (DELTASONE) 5 MG tablet Take 5 mg by mouth daily.      . Probiotic Product (ALIGN) 4 MG CAPS Take 1 capsule by mouth every morning.      . SUPER B COMPLEX/C CAPS Take 1 capsule by mouth daily.      . Tamsulosin HCl (FLOMAX) 0.4 MG CAPS Take 1 capsule (0.4 mg total) by mouth daily after supper.      . traMADol (ULTRAM) 50 MG tablet Take 1 tablet (50 mg total) by mouth every 6 (six) hours as needed for pain. For pain  10 tablet  0  . trimethoprim (TRIMPEX) 100 MG tablet Take 100 mg by mouth daily.       No current facility-administered medications for this visit.    Immunization History  Administered Date(s) Administered  . Influenza Split 06/09/2012     Diet:  History  Substance Use Topics  . Smoking status: Former Smoker -- 0.50 packs/day for 30 years    Types: Cigarettes    Quit date: 10/12/1973  . Smokeless tobacco: Never Used  . Alcohol Use: No    Family History  Problem Relation Age of Onset  . Coronary artery disease Brother   . Hypertension Father   . Hypertension Mother        Vital signs: BP 104/73   Pulse 101  Resp 20  Ht 5\' 6"  (1.676 m)  Wt 172 lb 12.8 oz (78.382 kg)  BMI 27.9 kg/m2  General Appearance:    Alert, cooperative, no distress, appears stated age  Head:    Normocephalic, without obvious abnormality, atraumatic  Eyes:    PERRL, conjunctiva/corneas clear, EOM's intact, fundi  benign, both eyes       Ears:    Normal TM's and external ear canals, both ears  Nose:   Nares normal, septum midline, mucosa normal, no drainage   or sinus tenderness  Throat:   Lips, mucosa, and tongue normal; teeth and gums normal  Neck:   Supple, symmetrical, trachea midline, no adenopathy;       thyroid:  No enlargement/tenderness/nodules; no carotid   bruit or JVD  Back:     Symmetric, no curvature, ROM normal, no CVA tenderness  Lungs:     Clear to auscultation bilaterally, respirations unlabored  Chest wall:    No tenderness or deformity  Heart:    Regular rate and rhythm, S1 and S2 normal, no murmur, rub   or gallop  Abdomen:     Soft, non-tender, bowel sounds active all four quadrants,    no masses, no organomegaly  Genitalia:    Normal male without lesion, discharge or tenderness  Rectal:    Normal tone, normal prostate, no masses or tenderness;   guaiac negative stool  Extremities:   Extremities normal, atraumatic, no cyanosis or edema  Pulses:   2+ and symmetric all extremities  Skin:   Skin color, texture, turgor normal, no rashes or lesions  Lymph nodes:   Cervical, supraclavicular, and axillary nodes normal  Neurologic:   CNII-XII intact. Normal strength, sensation and reflexes      throughout    Screening Score  MMS    PHQ2    PHQ9     Fall Risk    BIMS    Admission on 02/27/2013, Discharged on 03/02/2013  Component Date Value Range Status  . WBC 02/27/2013 13.7* 4.0 - 10.5 K/uL Final  . RBC 02/27/2013 3.78* 4.22 - 5.81 MIL/uL Final  . Hemoglobin 02/27/2013 11.0* 13.0 - 17.0 g/dL Final  . HCT 08/65/7846 34.0* 39.0 - 52.0 % Final  . MCV 02/27/2013 89.9  78.0 -  100.0 fL Final  . MCH 02/27/2013 29.1  26.0 - 34.0 pg Final  . MCHC 02/27/2013 32.4  30.0 - 36.0 g/dL Final  . RDW 96/29/5284 15.6* 11.5 - 15.5 % Final  . Platelets 02/27/2013 181  150 - 400 K/uL Final  . Neutrophils Relative % 02/27/2013 78* 43 - 77 % Final  . Neutro Abs 02/27/2013 10.7* 1.7 - 7.7 K/uL Final  . Lymphocytes Relative 02/27/2013 11* 12 - 46 % Final  . Lymphs Abs 02/27/2013 1.5  0.7 - 4.0 K/uL Final  . Monocytes Relative 02/27/2013 10  3 - 12 % Final  . Monocytes Absolute 02/27/2013 1.4* 0.1 - 1.0 K/uL Final  . Eosinophils Relative 02/27/2013 0  0 - 5 % Final  . Eosinophils Absolute 02/27/2013 0.0  0.0 - 0.7 K/uL Final  . Basophils Relative 02/27/2013 0  0 - 1 % Final  . Basophils Absolute 02/27/2013 0.1  0.0 - 0.1 K/uL Final  . Sodium 02/27/2013 138  135 - 145 mEq/L Final  . Potassium 02/27/2013 4.4  3.5 - 5.1 mEq/L Final  . Chloride 02/27/2013 96  96 - 112 mEq/L Final  . CO2 02/27/2013 27  19 - 32 mEq/L Final  . Glucose, Bld 02/27/2013 150* 70 - 99 mg/dL Final  . BUN 13/24/4010 57* 6 - 23 mg/dL Final  . Creatinine, Ser 02/27/2013 3.39* 0.50 - 1.35 mg/dL Final  . Calcium 27/25/3664 10.9* 8.4 - 10.5 mg/dL Final  . Total Protein 02/27/2013 7.0  6.0 - 8.3 g/dL Final  . Albumin 40/34/7425  2.9* 3.5 - 5.2 g/dL Final  . AST 16/06/9603 19  0 - 37 U/L Final  . ALT 02/27/2013 13  0 - 53 U/L Final  . Alkaline Phosphatase 02/27/2013 109  39 - 117 U/L Final  . Total Bilirubin 02/27/2013 0.5  0.3 - 1.2 mg/dL Final  . GFR calc non Af Amer 02/27/2013 16* >90 mL/min Final  . GFR calc Af Amer 02/27/2013 18* >90 mL/min Final   Comment:                                 The eGFR has been calculated                          using the CKD EPI equation.                          This calculation has not been                          validated in all clinical                          situations.                          eGFR's persistently                          <90 mL/min signify                           possible Chronic Kidney Disease.  Marland Kitchen Specimen Description 02/27/2013 BLOOD ARM RIGHT   Final  . Special Requests 02/27/2013 BOTTLES DRAWN AEROBIC AND ANAEROBIC 10CC   Final  . Culture  Setup Time 02/27/2013 02/28/2013 02:14   Final  . Culture 02/27/2013    Final                   Value:PROTEUS MIRABILIS                         Note: Gram Stain Report Called to,Read Back By and Verified With: CASSIE WICKER ON 03/01/2013 AT 12:07A BY WILEJ  . Report Status 02/27/2013 03/02/2013 FINAL   Final  . Organism ID, Bacteria 02/27/2013 PROTEUS MIRABILIS   Final  . Specimen Description 02/27/2013 BLOOD ARM LEFT   Final  . Special Requests 02/27/2013 BOTTLES DRAWN AEROBIC ONLY 10CC   Final  . Culture  Setup Time 02/27/2013 02/28/2013 02:15   Final  . Culture 02/27/2013    Final                   Value:PROTEUS MIRABILIS                         Note: SUSCEPTIBILITIES PERFORMED ON PREVIOUS CULTURE WITHIN THE LAST 5 DAYS.                         Note: Gram Stain Report Called to,Read Back By and Verified With: CASSIE WICKER ON 03/01/2013 AT 12:07A BY WILEJ  . Report Status 02/27/2013 03/02/2013 FINAL   Final  . Color, Urine  02/27/2013 YELLOW  YELLOW Final  . APPearance 02/27/2013 TURBID* CLEAR Final  . Specific Gravity, Urine 02/27/2013 1.010  1.005 - 1.030 Final  . pH 02/27/2013 6.5  5.0 - 8.0 Final  . Glucose, UA 02/27/2013 NEGATIVE  NEGATIVE mg/dL Final  . Hgb urine dipstick 02/27/2013 LARGE* NEGATIVE Final  . Bilirubin Urine 02/27/2013 NEGATIVE  NEGATIVE Final  . Ketones, ur 02/27/2013 NEGATIVE  NEGATIVE mg/dL Final  . Protein, ur 62/13/0865 100* NEGATIVE mg/dL Final  . Urobilinogen, UA 02/27/2013 0.2  0.0 - 1.0 mg/dL Final  . Nitrite 78/46/9629 NEGATIVE  NEGATIVE Final  . Leukocytes, UA 02/27/2013 LARGE* NEGATIVE Final  . Specimen Description 02/27/2013 URINE, CATHETERIZED   Final  . Special Requests 02/27/2013 NONE   Final  . Culture  Setup Time 02/27/2013 02/27/2013 22:02   Final   . Colony Count 02/27/2013 >=100,000 COLONIES/ML   Final  . Culture 02/27/2013 PROTEUS MIRABILIS   Final  . Report Status 02/27/2013 03/01/2013 FINAL   Final  . Organism ID, Bacteria 02/27/2013 PROTEUS MIRABILIS   Final  . Lactic Acid, Venous 02/27/2013 2.10  0.5 - 2.2 mmol/L Final  . Troponin i, poc 02/27/2013 0.00  0.00 - 0.08 ng/mL Final  . Comment 3 02/27/2013          Final   Comment: Due to the release kinetics of cTnI,                          a negative result within the first hours                          of the onset of symptoms does not rule out                          myocardial infarction with certainty.                          If myocardial infarction is still suspected,                          repeat the test at appropriate intervals.  . Squamous Epithelial / LPF 02/27/2013 RARE  RARE Final  . WBC, UA 02/27/2013 TOO NUMEROUS TO COUNT  <3 WBC/hpf Final  . RBC / HPF 02/27/2013 7-10  <3 RBC/hpf Final  . Bacteria, UA 02/27/2013 MANY* RARE Final  . Sodium 02/28/2013 140  135 - 145 mEq/L Final  . Potassium 02/28/2013 4.0  3.5 - 5.1 mEq/L Final  . Chloride 02/28/2013 105  96 - 112 mEq/L Final  . CO2 02/28/2013 25  19 - 32 mEq/L Final  . Glucose, Bld 02/28/2013 108* 70 - 99 mg/dL Final  . BUN 52/84/1324 49* 6 - 23 mg/dL Final  . Creatinine, Ser 02/28/2013 2.97* 0.50 - 1.35 mg/dL Final  . Calcium 40/06/2724 9.0  8.4 - 10.5 mg/dL Final  . GFR calc non Af Amer 02/28/2013 18* >90 mL/min Final  . GFR calc Af Amer 02/28/2013 21* >90 mL/min Final   Comment:                                 The eGFR has been calculated  using the CKD EPI equation.                          This calculation has not been                          validated in all clinical                          situations.                          eGFR's persistently                          <90 mL/min signify                          possible Chronic Kidney Disease.  . WBC 02/28/2013 10.0   4.0 - 10.5 K/uL Final  . RBC 02/28/2013 3.13* 4.22 - 5.81 MIL/uL Final  . Hemoglobin 02/28/2013 9.3* 13.0 - 17.0 g/dL Final  . HCT 56/21/3086 27.9* 39.0 - 52.0 % Final  . MCV 02/28/2013 89.1  78.0 - 100.0 fL Final  . MCH 02/28/2013 29.7  26.0 - 34.0 pg Final  . MCHC 02/28/2013 33.3  30.0 - 36.0 g/dL Final  . RDW 57/84/6962 15.8* 11.5 - 15.5 % Final  . Platelets 02/28/2013 171  150 - 400 K/uL Final  . MRSA by PCR 02/28/2013 NEGATIVE  NEGATIVE Final   Comment:                                 The GeneXpert MRSA Assay (FDA                          approved for NASAL specimens                          only), is one component of a                          comprehensive MRSA colonization                          surveillance program. It is not                          intended to diagnose MRSA                          infection nor to guide or                          monitor treatment for                          MRSA infections.  . Sodium 03/01/2013 145  135 - 145 mEq/L Final  . Potassium 03/01/2013 3.6  3.5 - 5.1 mEq/L Final  . Chloride 03/01/2013 109  96 - 112 mEq/L Final  . CO2 03/01/2013 25  19 - 32 mEq/L Final  . Glucose, Bld  03/01/2013 84  70 - 99 mg/dL Final  . BUN 16/06/9603 42* 6 - 23 mg/dL Final  . Creatinine, Ser 03/01/2013 2.65* 0.50 - 1.35 mg/dL Final  . Calcium 54/05/8118 9.3  8.4 - 10.5 mg/dL Final  . GFR calc non Af Amer 03/01/2013 21* >90 mL/min Final  . GFR calc Af Amer 03/01/2013 24* >90 mL/min Final   Comment:                                 The eGFR has been calculated                          using the CKD EPI equation.                          This calculation has not been                          validated in all clinical                          situations.                          eGFR's persistently                          <90 mL/min signify                          possible Chronic Kidney Disease.  . Magnesium 03/01/2013 1.7  1.5 - 2.5 mg/dL Final  .  Sodium 14/78/2956 142  135 - 145 mEq/L Final  . Potassium 03/02/2013 3.5  3.5 - 5.1 mEq/L Final  . Chloride 03/02/2013 106  96 - 112 mEq/L Final  . CO2 03/02/2013 29  19 - 32 mEq/L Final  . Glucose, Bld 03/02/2013 103* 70 - 99 mg/dL Final  . BUN 21/30/8657 35* 6 - 23 mg/dL Final  . Creatinine, Ser 03/02/2013 2.12* 0.50 - 1.35 mg/dL Final  . Calcium 84/69/6295 8.9  8.4 - 10.5 mg/dL Final  . GFR calc non Af Amer 03/02/2013 27* >90 mL/min Final  . GFR calc Af Amer 03/02/2013 32* >90 mL/min Final   Comment:                                 The eGFR has been calculated                          using the CKD EPI equation.                          This calculation has not been                          validated in all clinical                          situations.  eGFR's persistently                          <90 mL/min signify                          possible Chronic Kidney Disease.      Annual summary: Hospitalizations:    Infection History:  Functional assessment: Areas of potential improvement: Rehabilitation Potential: Prognosis for survival: Plan:  This encounter was created in error - please disregard.

## 2013-03-21 ENCOUNTER — Non-Acute Institutional Stay (SKILLED_NURSING_FACILITY): Payer: Medicare Other | Admitting: Nurse Practitioner

## 2013-03-21 DIAGNOSIS — D649 Anemia, unspecified: Secondary | ICD-10-CM

## 2013-03-21 DIAGNOSIS — I10 Essential (primary) hypertension: Secondary | ICD-10-CM

## 2013-03-21 DIAGNOSIS — N179 Acute kidney failure, unspecified: Secondary | ICD-10-CM

## 2013-03-21 DIAGNOSIS — R300 Dysuria: Secondary | ICD-10-CM

## 2013-03-21 NOTE — Progress Notes (Signed)
Patient ID: [name], male   DOB: August 02, 1928, 79 y.o.   MRN: [id number]  Nursing Home Location:  [facility]   Place of Service: SNF (31)   Chief Complaint: medical management of chronic conditions HPI:  Patient is an 79 year old man with a history of hypertension rheumatoid arthritis, recurrent UTIs, COPD, anemia who is at [facility] for STR is seen today for routine follow up. Within the last month pt was sent to the hospital with increased confusion and found his urine culture as well as blood culture showed Proteus mirabilis. Susceptible to ampicillin and ceftriaxone. Patient discharged to nursing home on Ceftin for 14 more days. His acute confusional state and lethargy is secondary to his septicemia/UTI. This is resolved now, patient awake and alert but I think he is back to his baseline. 03/02/2013 was discharged back to [facility]. He still conts to complain of dysuria despite recent urine cultures being negative for UTI   Review of Systems:  Review of Systems  Constitutional: Negative for fever, chills and malaise/fatigue.  Respiratory: Positive for cough. Negative for sputum production and shortness of breath.   Cardiovascular: Negative for chest pain, palpitations and orthopnea.  Gastrointestinal: Negative for abdominal pain, diarrhea and constipation.  Genitourinary: Positive for dysuria.  Musculoskeletal: Negative for myalgias.  Neurological: Positive for headaches. Negative for dizziness and weakness.  Psychiatric/Behavioral: Negative for depression. The patient does not have insomnia.      Medications: Patient's Medications  New Prescriptions   No medications on file  Previous Medications   ALBUTEROL (PROVENTIL) (2.5 MG/3ML) 0.083% NEBULIZER SOLUTION    Take 2.5 mg by nebulization every 4 (four) hours as needed. For shortness of breath   ALENDRONATE (FOSAMAX) 70 MG TABLET    Take 70 mg by mouth every 7 (seven) days. Take with a full glass of water on an  empty stomach.  On Friday   ASPIRIN EC 81 MG TABLET    Take 81 mg by mouth every morning.    CALCIUM CITRATE (CALCITRATE - DOSED IN MG ELEMENTAL CALCIUM) 950 MG TABLET    Take 2 tablets by mouth daily.   CEFUROXIME (CEFTIN) 500 MG TABLET    Take 1 tablet (500 mg total) by mouth 2 (two) times daily.   CHOLECALCIFEROL (VITAMIN D) 2000 UNITS TABLET    Take 2,000 Units by mouth every morning.    ESCITALOPRAM (LEXAPRO) 10 MG TABLET    Take 10 mg by mouth daily.   FE FUM-FA-B CMP-C-ZN-MG-MN-CU (HEMATINIC PLUS VIT/MINERALS PO)    Take 1 tablet by mouth daily.   FLUNISOLIDE (NASAREL) 29 MCG/ACT (0.025%) NASAL SPRAY    Place 2 sprays into the nose 2 (two) times daily. Dose is for each nostril.   FUROSEMIDE (LASIX) 20 MG TABLET    Take 20 mg by mouth 2 (two) times daily.   GABAPENTIN (NEURONTIN) 100 MG CAPSULE    Take 200 mg by mouth 2 (two) times daily.   LEFLUNOMIDE (ARAVA) 10 MG TABLET    Take 10 mg by mouth daily.   METOPROLOL TARTRATE (LOPRESSOR) 25 MG TABLET    Take 25 mg by mouth 2 (two) times daily.   OMEPRAZOLE (PRILOSEC) 20 MG CAPSULE    Take 20 mg by mouth daily.    PREDNISONE (DELTASONE) 5 MG TABLET    Take 5 mg by mouth daily.   PROBIOTIC PRODUCT (ALIGN) 4 MG CAPS    Take 1 capsule by mouth every morning.   SUPER B COMPLEX/C CAPS  Take 1 capsule by mouth daily.   TAMSULOSIN HCL (FLOMAX) 0.4 MG CAPS    Take 1 capsule (0.4 mg total) by mouth daily after supper.   TRAMADOL (ULTRAM) 50 MG TABLET    Take 1 tablet (50 mg total) by mouth every 6 (six) hours as needed for pain. For pain   TRIMETHOPRIM (TRIMPEX) 100 MG TABLET    Take 100 mg by mouth daily.  Modified Medications   No medications on file  Discontinued Medications   No medications on file     Physical Exam:  Filed Vitals:   03/21/13 1639  BP: 110/77  Pulse: 79  Temp: 96.5 F (35.8 C)  Resp: 20    Physical Exam  Nursing note and vitals reviewed. Constitutional: He is well-developed, well-nourished, and in no distress.  No distress.  Neck: Normal range of motion. Neck supple.  Cardiovascular: Normal rate and regular rhythm.   Pulmonary/Chest: Effort normal and breath sounds normal. No respiratory distress.  Abdominal: Soft. Bowel sounds are normal. He exhibits no distension. There is no tenderness.  Musculoskeletal: He exhibits no edema and no tenderness.  Neurological: He is alert.  Skin: Skin is warm and dry. He is not diaphoretic.     Assessment/Plan  1.   Anemia 285.9     Will follow up cbc   2.   ARF (acute renal failure) 584.9     Follow up bmp   3.   Dysuria 788.1     Will add cranberry tablet BID and pt has upcoming urology appt due to freq dysuria and UTI- staff to confirm appt    4.   HYPERTENSION   Patient is stable; continue current regimen. Will monitor and make changes as necessary.     Labs/tests ordered

## 2013-03-27 ENCOUNTER — Encounter: Payer: Self-pay | Admitting: Nurse Practitioner

## 2013-03-27 ENCOUNTER — Non-Acute Institutional Stay (SKILLED_NURSING_FACILITY): Payer: Medicare Other | Admitting: Nurse Practitioner

## 2013-03-27 DIAGNOSIS — R300 Dysuria: Secondary | ICD-10-CM

## 2013-03-27 NOTE — Progress Notes (Signed)
Patient ID: [name], male   DOB: 1928-09-05, 79 y.o.   MRN: [id number]  Nursing Home Location:  [facility]   Place of Service: SNF (31)   Chief Complaint; AV; dysuria   HPI:  79 year old male with ongoing dysuria complaining of worsening symptoms was started on cranberry twice daily; pt reports this has not helped with symptoms; no fevers or chills; no increased fatigue or weakness   Review of Systems:  Review of Systems  Constitutional: Negative for fever, chills and malaise/fatigue.  Respiratory: Negative for shortness of breath.   Cardiovascular: Negative for chest pain.  Genitourinary: Positive for dysuria, urgency and frequency.  Neurological: Negative for weakness.     Medications: Patient's Medications  New Prescriptions   No medications on file  Previous Medications   ALBUTEROL (PROVENTIL) (2.5 MG/3ML) 0.083% NEBULIZER SOLUTION    Take 2.5 mg by nebulization every 4 (four) hours as needed. For shortness of breath   ALENDRONATE (FOSAMAX) 70 MG TABLET    Take 70 mg by mouth every 7 (seven) days. Take with a full glass of water on an empty stomach.  On Friday   ASPIRIN EC 81 MG TABLET    Take 81 mg by mouth every morning.    CALCIUM CITRATE (CALCITRATE - DOSED IN MG ELEMENTAL CALCIUM) 950 MG TABLET    Take 2 tablets by mouth daily.   CEFUROXIME (CEFTIN) 500 MG TABLET    Take 1 tablet (500 mg total) by mouth 2 (two) times daily.   CHOLECALCIFEROL (VITAMIN D) 2000 UNITS TABLET    Take 2,000 Units by mouth every morning.    ESCITALOPRAM (LEXAPRO) 10 MG TABLET    Take 10 mg by mouth daily.   FE FUM-FA-B CMP-C-ZN-MG-MN-CU (HEMATINIC PLUS VIT/MINERALS PO)    Take 1 tablet by mouth daily.   FLUNISOLIDE (NASAREL) 29 MCG/ACT (0.025%) NASAL SPRAY    Place 2 sprays into the nose 2 (two) times daily. Dose is for each nostril.   FUROSEMIDE (LASIX) 20 MG TABLET    Take 20 mg by mouth 2 (two) times daily.   GABAPENTIN (NEURONTIN) 100 MG CAPSULE    Take 200 mg by mouth  2 (two) times daily.   LEFLUNOMIDE (ARAVA) 10 MG TABLET    Take 10 mg by mouth daily.   METOPROLOL TARTRATE (LOPRESSOR) 25 MG TABLET    Take 25 mg by mouth 2 (two) times daily.   OMEPRAZOLE (PRILOSEC) 20 MG CAPSULE    Take 20 mg by mouth daily.    PREDNISONE (DELTASONE) 5 MG TABLET    Take 5 mg by mouth daily.   PROBIOTIC PRODUCT (ALIGN) 4 MG CAPS    Take 1 capsule by mouth every morning.   SUPER B COMPLEX/C CAPS    Take 1 capsule by mouth daily.   TAMSULOSIN HCL (FLOMAX) 0.4 MG CAPS    Take 1 capsule (0.4 mg total) by mouth daily after supper.   TRAMADOL (ULTRAM) 50 MG TABLET    Take 1 tablet (50 mg total) by mouth every 6 (six) hours as needed for pain. For pain   TRIMETHOPRIM (TRIMPEX) 100 MG TABLET    Take 100 mg by mouth daily.  Modified Medications   No medications on file  Discontinued Medications   No medications on file     Physical Exam:  Filed Vitals:   03/27/13 1327  BP: 98/75  Pulse: 72  Temp: 97.4 F (36.3 C)  Resp: 20    Physical Exam  Constitutional: He is well-developed, well-nourished, and in no distress. No distress.  Cardiovascular: Normal rate and regular rhythm.   Pulmonary/Chest: Effort normal and breath sounds normal.  Abdominal: Soft. Bowel sounds are normal. He exhibits no distension. There is tenderness (in lower abdomen associated with urination).  Skin: He is not diaphoretic.     Assessment/Plan  Dysuria- will send for UA C&S; to have staff offer additional 240 cc of beverage of choice q 6 hours while awake

## 2013-04-14 ENCOUNTER — Non-Acute Institutional Stay (SKILLED_NURSING_FACILITY): Payer: Medicare Other | Admitting: Nurse Practitioner

## 2013-04-14 DIAGNOSIS — L299 Pruritus, unspecified: Secondary | ICD-10-CM

## 2013-04-14 NOTE — Progress Notes (Signed)
Patient ID: [name], male   DOB: Jun 06, 1928, 79 y.o.   MRN: [id number]  Nursing Home Location:  [facility]   Place of Service: SNF (31)  Chief Complaint  Patient presents with  . Acute Visit    HPI:  79 year old male who has been complaining of itching for several weeks. Reports has itched his sides until they were raw last week- this has improved now but he would like something for itching. Reports dry, flaking skin. No open sores or rashes  Review of Systems:  Review of Systems  Constitutional: Negative for fever, chills and malaise/fatigue.  Respiratory: Negative for shortness of breath.   Cardiovascular: Negative for chest pain and leg swelling.  Gastrointestinal: Negative for heartburn, diarrhea and constipation.  Musculoskeletal: Negative for myalgias.  Skin: Positive for itching. Negative for rash.       With dry flaky spots on arms, legs, and face  Neurological: Negative for dizziness and weakness.    Medications: Patient's Medications  New Prescriptions   No medications on file  Previous Medications   ALBUTEROL (PROVENTIL) (2.5 MG/3ML) 0.083% NEBULIZER SOLUTION    Take 2.5 mg by nebulization every 4 (four) hours as needed. For shortness of breath   ALENDRONATE (FOSAMAX) 70 MG TABLET    Take 70 mg by mouth every 7 (seven) days. Take with a full glass of water on an empty stomach.  On Friday   ASPIRIN EC 81 MG TABLET    Take 81 mg by mouth every morning.    CALCIUM CITRATE (CALCITRATE - DOSED IN MG ELEMENTAL CALCIUM) 950 MG TABLET    Take 2 tablets by mouth daily.   CEFUROXIME (CEFTIN) 500 MG TABLET    Take 1 tablet (500 mg total) by mouth 2 (two) times daily.   CHOLECALCIFEROL (VITAMIN D) 2000 UNITS TABLET    Take 2,000 Units by mouth every morning.    ESCITALOPRAM (LEXAPRO) 10 MG TABLET    Take 10 mg by mouth daily.   FE FUM-FA-B CMP-C-ZN-MG-MN-CU (HEMATINIC PLUS VIT/MINERALS PO)    Take 1 tablet by mouth daily.   FLUNISOLIDE (NASAREL) 29 MCG/ACT  (0.025%) NASAL SPRAY    Place 2 sprays into the nose 2 (two) times daily. Dose is for each nostril.   FUROSEMIDE (LASIX) 20 MG TABLET    Take 20 mg by mouth 2 (two) times daily.   GABAPENTIN (NEURONTIN) 100 MG CAPSULE    Take 200 mg by mouth 2 (two) times daily.   LEFLUNOMIDE (ARAVA) 10 MG TABLET    Take 10 mg by mouth daily.   METOPROLOL TARTRATE (LOPRESSOR) 25 MG TABLET    Take 25 mg by mouth 2 (two) times daily.   OMEPRAZOLE (PRILOSEC) 20 MG CAPSULE    Take 20 mg by mouth daily.    PREDNISONE (DELTASONE) 5 MG TABLET    Take 5 mg by mouth daily.   PROBIOTIC PRODUCT (ALIGN) 4 MG CAPS    Take 1 capsule by mouth every morning.   SUPER B COMPLEX/C CAPS    Take 1 capsule by mouth daily.   TAMSULOSIN HCL (FLOMAX) 0.4 MG CAPS    Take 1 capsule (0.4 mg total) by mouth daily after supper.   TRAMADOL (ULTRAM) 50 MG TABLET    Take 1 tablet (50 mg total) by mouth every 6 (six) hours as needed for pain. For pain   TRIMETHOPRIM (TRIMPEX) 100 MG TABLET    Take 100 mg by mouth daily.  Modified Medications  No medications on file  Discontinued Medications   No medications on file     Physical Exam:  Filed Vitals:   04/14/13 1523  BP: 120/72  Pulse: 70  Temp: 97.5 F (36.4 C)  Resp: 20     Physical Exam  Constitutional: He is well-developed, well-nourished, and in no distress. No distress.  Cardiovascular: Normal rate, regular rhythm and normal heart sounds.   Pulmonary/Chest: Effort normal and breath sounds normal.  Abdominal: Soft. Bowel sounds are normal.  Skin: Skin is warm, dry and intact. He is not diaphoretic. No pallor.  Dry flaky skin on arms in patches and legs. Some areas on face      Labs reviewed/Significant Diagnostic Results: Basic Metabolic Panel       Result: 04/05/2013 4:06 PM    ( Status: F )            Sodium  140        135-145  mEq/L  SLN       Potassium  3.7        3.5-5.3  mEq/L  SLN       Chloride  101        96-112  mEq/L  SLN       CO2  31        19-32  mEq/L   SLN       Glucose  93        70-99  mg/dL  SLN       BUN  48     H  6-23  mg/dL  SLN       Creatinine  2.01     H  0.50-1.35  mg/dL  SLN       Calcium  9.8        8.4-10.5  mg/dL  SLN      Lipid Profile       Result: 04/05/2013 4:06 PM    ( Status: F )            Cholesterol  176        0-200  mg/dL  SLN  C     Triglyceride  94        <150  mg/dL  SLN       HDL Cholesterol  41        >39  mg/dL  SLN       Total Chol/HDL Ratio  4.3         Ratio  SLN       VLDL Cholesterol (Calc)  19        0-40  mg/dL  SLN       LDL Cholesterol (Calc)  116     H  0-99  mg/dL  SLN  C    Liver Profile       Result: 04/05/2013 4:06 PM    ( Status: F )            Bilirubin, Total  0.4        0.3-1.2  mg/dL  SLN       Bilirubin, Direct  0.1        0.0-0.3  mg/dL  SLN       Indirect Bilirubin  0.3        0.0-0.9  mg/dL  SLN       Alkaline Phosphatase  70        39-117  U/L  SLN       AST/SGOT  15  0-37  U/L  SLN       ALT/SGPT  12        0-53  U/L  SLN       Total Protein  5.2     L  6.0-8.3  g/dL  SLN       Albumin  3.0     L  3.5-5.2  g/dL  SLN        Assessment/Plan Pruritus- with dry skin- will have staff apply eurcein cream to skin twice daily and may have Vistaril 25 mg every 8 hours for itch

## 2013-04-17 ENCOUNTER — Encounter: Payer: Self-pay | Admitting: Nurse Practitioner

## 2013-04-17 ENCOUNTER — Non-Acute Institutional Stay (SKILLED_NURSING_FACILITY): Payer: Medicare Other | Admitting: Nurse Practitioner

## 2013-04-17 DIAGNOSIS — R5381 Other malaise: Secondary | ICD-10-CM

## 2013-04-17 DIAGNOSIS — J449 Chronic obstructive pulmonary disease, unspecified: Secondary | ICD-10-CM

## 2013-04-17 DIAGNOSIS — R300 Dysuria: Secondary | ICD-10-CM

## 2013-04-17 DIAGNOSIS — I10 Essential (primary) hypertension: Secondary | ICD-10-CM

## 2013-04-17 DIAGNOSIS — D649 Anemia, unspecified: Secondary | ICD-10-CM

## 2013-04-17 NOTE — Progress Notes (Signed)
Patient ID: [name], male   DOB: Jun 10, 1928, 79 y.o.   MRN: [id number]  Nursing Home Location:  [facility]   Place of Service: SNF (31)  Chief Complaint  Patient presents with  . Discharge Note  . Medical Managment of Chronic Issues    HPI:  79 year old male with history of hypertension, rheumatoid arthritis, COPD CKD stage 3 who was admitted to the Hospital from 4/8- 4/10 for shortness of breath, generalized weakness and bilateral lower extremity cellulitis. Patient had a generalized weakness likely secondary to COPD exacerbation and lower extremity cellulitis. He was started on doxycycline and his symptoms improved. For his COPD he was started on steroids and nebulizers with improvement in symptoms. Patient was seen by physical therapy for generalized weakness and failure to thrive and recommended for short-term skilled nursing facility for strength training and transfers.  Pt has been at [facility] since for strengthen. While at [facility] frequent complaints of pain with urination and he is following with urology due to this. Pt has been weaned off his oxygen and tolerating it well. denies any symptoms including headache, bloody vision, dizziness, fever, chills, shortness of breath, cough, chest pain, palpitations, abdominal pain, nausea, vomiting, diarrhea or constipation. Patient currently doing well with therapy, now stable to discharge home with home health to live with daughter. Will need DME upon discharge.   Review of Systems:  Review of Systems  Constitutional: Negative for fever and chills.  HENT: Negative for congestion, sore throat and neck pain.   Eyes: Negative.   Respiratory: Negative for cough, sputum production, shortness of breath and wheezing.   Cardiovascular: Negative for chest pain and palpitations.  Gastrointestinal: Negative for heartburn, abdominal pain, diarrhea and constipation.  Genitourinary: Positive for dysuria (ongoing complaints of dyuria-  stable ). Negative for urgency and frequency.  Musculoskeletal: Negative for myalgias and joint pain.  Skin: Positive for itching (improved with medication).  Neurological: Negative for dizziness and headaches.  Psychiatric/Behavioral: Negative for depression. The patient is not nervous/anxious and does not have insomnia.      Medications: Patient's Medications  New Prescriptions   No medications on file  Previous Medications   ALBUTEROL (PROVENTIL) (2.5 MG/3ML) 0.083% NEBULIZER SOLUTION    Take 2.5 mg by nebulization every 4 (four) hours as needed. For shortness of breath   ALENDRONATE (FOSAMAX) 70 MG TABLET    Take 70 mg by mouth every 7 (seven) days. Take with a full glass of water on an empty stomach.  On Friday   ASPIRIN EC 81 MG TABLET    Take 81 mg by mouth every morning.    CALCIUM CITRATE (CALCITRATE - DOSED IN MG ELEMENTAL CALCIUM) 950 MG TABLET    Take 2 tablets by mouth daily.   CEFUROXIME (CEFTIN) 500 MG TABLET    Take 1 tablet (500 mg total) by mouth 2 (two) times daily.   CHOLECALCIFEROL (VITAMIN D) 2000 UNITS TABLET    Take 2,000 Units by mouth every morning.    ESCITALOPRAM (LEXAPRO) 10 MG TABLET    Take 10 mg by mouth daily.   FE FUM-FA-B CMP-C-ZN-MG-MN-CU (HEMATINIC PLUS VIT/MINERALS PO)    Take 1 tablet by mouth daily.   FLUNISOLIDE (NASAREL) 29 MCG/ACT (0.025%) NASAL SPRAY    Place 2 sprays into the nose 2 (two) times daily. Dose is for each nostril.   FUROSEMIDE (LASIX) 20 MG TABLET    Take 20 mg by mouth 2 (two) times daily.   GABAPENTIN (NEURONTIN) 100 MG CAPSULE  Take 200 mg by mouth 2 (two) times daily.   LEFLUNOMIDE (ARAVA) 10 MG TABLET    Take 10 mg by mouth daily.   METOPROLOL TARTRATE (LOPRESSOR) 25 MG TABLET    Take 25 mg by mouth 2 (two) times daily.   OMEPRAZOLE (PRILOSEC) 20 MG CAPSULE    Take 20 mg by mouth daily.    PREDNISONE (DELTASONE) 5 MG TABLET    Take 5 mg by mouth daily.   PROBIOTIC PRODUCT (ALIGN) 4 MG CAPS    Take 1 capsule by mouth every  morning.   SUPER B COMPLEX/C CAPS    Take 1 capsule by mouth daily.   TAMSULOSIN HCL (FLOMAX) 0.4 MG CAPS    Take 1 capsule (0.4 mg total) by mouth daily after supper.   TRAMADOL (ULTRAM) 50 MG TABLET    Take 1 tablet (50 mg total) by mouth every 6 (six) hours as needed for pain. For pain   TRIMETHOPRIM (TRIMPEX) 100 MG TABLET    Take 100 mg by mouth daily.  Modified Medications   No medications on file  Discontinued Medications   No medications on file     Physical Exam:  Filed Vitals:   04/17/13 1141  BP: 122/64  Pulse: 69  Temp: 97.4 F (36.3 C)  Resp: 20    Physical Exam  Vitals reviewed. Constitutional: He is oriented to person, place, and time and well-developed, well-nourished, and in no distress. No distress.  HENT:  Head: Normocephalic and atraumatic.  Right Ear: External ear normal.  Left Ear: External ear normal.  Mouth/Throat: Oropharynx is clear and moist. No oropharyngeal exudate.  Eyes: Conjunctivae and EOM are normal. Pupils are equal, round, and reactive to light.  Neck: Normal range of motion. Neck supple. No tracheal deviation present. No thyromegaly present.  Cardiovascular: Normal rate, regular rhythm and normal heart sounds.   Pulmonary/Chest: Effort normal and breath sounds normal.  Abdominal: Soft. Bowel sounds are normal. He exhibits no distension. There is no tenderness.  Musculoskeletal: Normal range of motion. He exhibits no edema and no tenderness.  Lymphadenopathy:    He has no cervical adenopathy.  Neurological: He is alert and oriented to person, place, and time.  Skin: Skin is warm and dry. He is not diaphoretic. No erythema.     Labs reviewed/Significant Diagnostic Results: Urinalysis rflx Microscopic       Result: 04/07/2013 4:19 PM    ( Status: F )            Color  YELLOW        YELLOW   SLN       Appearance  CLEAR        CLEAR   SLN       Specific Gravity  1.014        1.005-1.030   SLN       pH  6.0        5.0-8.0   SLN        Glucose  NEG        NEG  mg/dL  SLN       Bilirubin  NEG        NEG   SLN       Ketone  NEG        NEG  mg/dL  SLN       Blood  TRACE     A  NEG   SLN       Protein  NEG  NEG  mg/dL  SLN       Urobilinogen  0.2        0.0-1.0  mg/dL  SLN       Nitrite  NEG        NEG   SLN       Leukocyte Esterase  LARGE     A  NEG   SLN      Urine Microscopic       Result: 04/07/2013 4:32 PM    ( Status: F )            Squamous Epithelial/ HPF  NONE SEEN        RARE   SLN       Crystals  NONE SEEN        NONE SEEN   SLN       Casts  NONE SEEN        NONE SEEN   SLN       WBC  >50     A  <3  WBC/hpf  SLN       RBC  0-2        <3  RBC/hpf  SLN       Bacteria/ HPF  NONE SEEN        RARE   SLN      Culture, Urine  Source: .      Result: 04/09/2013 6:05 PM    ( Status: F )                       COLONY COUNT:                      >=100,000 COLONIES/ML                                                       FINAL REPORT                      ENTEROCOCCUS SPECIES                                                                    COLONY COUNT:                      >=100,000 COLONIES/ML  FINAL REPORT                      ENTEROCOCCUS SPECIES   Sensitivity for: ENTEROCOCCUS SPECIES  Source: .      Result: 04/09/2013 6:05 PM    ( Status: F )            AMPICILLIN  R         LEVOFLOXACIN  R, >=8     >=8          NITROFURANTOIN  S, <=16     <=16          VANCOMYCIN  S, 1     1          TETRACYCLINE  R, >=16     >=  16     Basic Metabolic Panel       Result: 04/05/2013 4:06 PM    ( Status: F )            Sodium  140        135-145  mEq/L  SLN       Potassium  3.7        3.5-5.3  mEq/L  SLN       Chloride  101        96-112  mEq/L  SLN       CO2  31        19-32  mEq/L  SLN       Glucose  93        70-99  mg/dL  SLN       BUN  48     H  6-23  mg/dL  SLN       Creatinine  2.01     H  0.50-1.35  mg/dL  SLN       Calcium  9.8        8.4-10.5  mg/dL  SLN      Lipid Profile       Result: 04/05/2013 4:06 PM     ( Status: F )            Cholesterol  176        0-200  mg/dL  SLN  C     Triglyceride  94        <150  mg/dL  SLN       HDL Cholesterol  41        >39  mg/dL  SLN       Total Chol/HDL Ratio  4.3         Ratio  SLN       VLDL Cholesterol (Calc)  19        0-40  mg/dL  SLN       LDL Cholesterol (Calc)  116     H  0-99  mg/dL  SLN  C    Liver Profile       Result: 04/05/2013 4:06 PM    ( Status: F )            Bilirubin, Total  0.4        0.3-1.2  mg/dL  SLN       Bilirubin, Direct  0.1        0.0-0.3  mg/dL  SLN       Indirect Bilirubin  0.3        0.0-0.9  mg/dL  SLN       Alkaline Phosphatase  70        39-117  U/L  SLN       AST/SGOT  15        0-37  U/L  SLN       ALT/SGPT  12        0-53  U/L  SLN       Total Protein  5.2     L  6.0-8.3  g/dL  SLN       Albumin  3.0     L   Basic Metabolic Panel       Result: 04/03/2013 3:34 PM    ( Status: F )            Sodium  142        135-145  mEq/L  SLN  Potassium  3.4     L  3.5-5.3  mEq/L  SLN       Chloride  102        96-112  mEq/L  SLN       CO2  32        19-32  mEq/L  SLN       Glucose  84        70-99  mg/dL  SLN       BUN  55     H  6-23  mg/dL  SLN       Creatinine  2.26     H  0.50-1.35  mg/dL  SLN       Calcium  9.6   CBC with Diff       Result: 03/22/2013 2:26 PM    ( Status: F )            WBC  6.6        4.0-10.5  K/uL  SLN       RBC  3.21     L  4.22-5.81  MIL/uL  SLN       Hemoglobin  9.1     L  13.0-17.0  g/dL  SLN       Hematocrit  27.6     L  39.0-52.0  %  SLN       MCV  86.0        78.0-100.0  fL  SLN       MCH  28.3        26.0-34.0  pg  SLN       MCHC  33.0        30.0-36.0  g/dL  SLN       RDW  16.1     H  11.5-15.5  %  SLN       Platelet Count  220        150-400  K/uL  SLN       Granulocyte %  52        43-77  %  SLN       Absolute Gran  3.5        1.7-7.7  K/uL  SLN       Lymph %  31        12-46  %  SLN       Absolute Lymph  2.1        0.7-4.0  K/uL  SLN       Mono %  10        3-12  %  SLN        Absolute Mono  0.6        0.1-1.0  K/uL  SLN       Eos %  6     H  0-5  %  SLN       Absolute Eos  0.4        0.0-0.7  K/uL  SLN       Baso %  1        0-1  %  SLN       Absolute Baso  0.0        0.0-0.1  K/uL  SLN       Smear Review  Criteria for review not met   SLN      Basic Metabolic Panel       Result: 03/22/2013 3:58 PM    ( Status: F )  Sodium  137        135-145  mEq/L  SLN       Potassium  3.8        3.5-5.3  mEq/L  SLN       Chloride  97        96-112  mEq/L  SLN       CO2  31        19-32  mEq/L  SLN       Glucose  89        70-99  mg/dL  SLN       BUN  84     H  6-23  mg/dL  SLN       Creatinine  3.68     H  0.50-1.35  mg/dL  SLN       Calcium  9.7             Assessment/Plan  1. Anemia stable to cont supplements   2.   HYPERTENSION 401.9   Stable on current medications    3.   Dysuria 788.1   Ongoing- following with urology- recent UTI treated with macroBID   4.   CKD (chronic kidney disease) stage 3, GFR 30-59 ml/min stable    5.   COPD (chronic obstructive pulmonary disease) 496   Stable- off O2 at this time tolerating well   6.   Physical deconditioning pt is stable for discharge-will need therapies per home health. DME needed 3;1, tub bench, wheeled walker. Rx written.  will need to follow up with PCP within 2 weeks.

## 2013-04-27 ENCOUNTER — Observation Stay (HOSPITAL_COMMUNITY): Payer: Medicare Other

## 2013-04-27 ENCOUNTER — Inpatient Hospital Stay (HOSPITAL_COMMUNITY)
Admission: EM | Admit: 2013-04-27 | Discharge: 2013-05-05 | DRG: 690 | Disposition: A | Discharge status: Other Acute Inpatient Hospital | Payer: Medicare Other | Attending: Internal Medicine | Admitting: Internal Medicine

## 2013-04-27 ENCOUNTER — Encounter (HOSPITAL_COMMUNITY): Payer: Self-pay | Admitting: Emergency Medicine

## 2013-04-27 ENCOUNTER — Emergency Department (HOSPITAL_COMMUNITY): Payer: Medicare Other

## 2013-04-27 DIAGNOSIS — M25569 Pain in unspecified knee: Secondary | ICD-10-CM | POA: Diagnosis present

## 2013-04-27 DIAGNOSIS — B952 Enterococcus as the cause of diseases classified elsewhere: Secondary | ICD-10-CM | POA: Diagnosis present

## 2013-04-27 DIAGNOSIS — R509 Fever, unspecified: Secondary | ICD-10-CM

## 2013-04-27 DIAGNOSIS — J984 Other disorders of lung: Secondary | ICD-10-CM

## 2013-04-27 DIAGNOSIS — IMO0002 Reserved for concepts with insufficient information to code with codable children: Secondary | ICD-10-CM | POA: Diagnosis present

## 2013-04-27 DIAGNOSIS — I10 Essential (primary) hypertension: Secondary | ICD-10-CM

## 2013-04-27 DIAGNOSIS — L03119 Cellulitis of unspecified part of limb: Secondary | ICD-10-CM | POA: Diagnosis present

## 2013-04-27 DIAGNOSIS — D649 Anemia, unspecified: Secondary | ICD-10-CM

## 2013-04-27 DIAGNOSIS — Z96659 Presence of unspecified artificial knee joint: Secondary | ICD-10-CM

## 2013-04-27 DIAGNOSIS — J9859 Other diseases of mediastinum, not elsewhere classified: Secondary | ICD-10-CM

## 2013-04-27 DIAGNOSIS — L02419 Cutaneous abscess of limb, unspecified: Secondary | ICD-10-CM | POA: Diagnosis present

## 2013-04-27 DIAGNOSIS — J449 Chronic obstructive pulmonary disease, unspecified: Secondary | ICD-10-CM

## 2013-04-27 DIAGNOSIS — Z87440 Personal history of urinary (tract) infections: Secondary | ICD-10-CM

## 2013-04-27 DIAGNOSIS — Z87891 Personal history of nicotine dependence: Secondary | ICD-10-CM

## 2013-04-27 DIAGNOSIS — I129 Hypertensive chronic kidney disease with stage 1 through stage 4 chronic kidney disease, or unspecified chronic kidney disease: Secondary | ICD-10-CM | POA: Diagnosis present

## 2013-04-27 DIAGNOSIS — N289 Disorder of kidney and ureter, unspecified: Secondary | ICD-10-CM

## 2013-04-27 DIAGNOSIS — E860 Dehydration: Secondary | ICD-10-CM | POA: Diagnosis present

## 2013-04-27 DIAGNOSIS — R0600 Dyspnea, unspecified: Secondary | ICD-10-CM

## 2013-04-27 DIAGNOSIS — D72829 Elevated white blood cell count, unspecified: Secondary | ICD-10-CM

## 2013-04-27 DIAGNOSIS — G9340 Encephalopathy, unspecified: Secondary | ICD-10-CM

## 2013-04-27 DIAGNOSIS — J4489 Other specified chronic obstructive pulmonary disease: Secondary | ICD-10-CM | POA: Diagnosis present

## 2013-04-27 DIAGNOSIS — L0390 Cellulitis, unspecified: Secondary | ICD-10-CM

## 2013-04-27 DIAGNOSIS — N1330 Unspecified hydronephrosis: Secondary | ICD-10-CM | POA: Diagnosis present

## 2013-04-27 DIAGNOSIS — N179 Acute kidney failure, unspecified: Secondary | ICD-10-CM

## 2013-04-27 DIAGNOSIS — R300 Dysuria: Secondary | ICD-10-CM

## 2013-04-27 DIAGNOSIS — N139 Obstructive and reflux uropathy, unspecified: Secondary | ICD-10-CM

## 2013-04-27 DIAGNOSIS — R000 Tachycardia, unspecified: Secondary | ICD-10-CM

## 2013-04-27 DIAGNOSIS — N390 Urinary tract infection, site not specified: Principal | ICD-10-CM

## 2013-04-27 DIAGNOSIS — Z66 Do not resuscitate: Secondary | ICD-10-CM | POA: Diagnosis present

## 2013-04-27 DIAGNOSIS — N183 Chronic kidney disease, stage 3 unspecified: Secondary | ICD-10-CM

## 2013-04-27 DIAGNOSIS — M069 Rheumatoid arthritis, unspecified: Secondary | ICD-10-CM

## 2013-04-27 DIAGNOSIS — D638 Anemia in other chronic diseases classified elsewhere: Secondary | ICD-10-CM | POA: Diagnosis present

## 2013-04-27 DIAGNOSIS — A4159 Other Gram-negative sepsis: Secondary | ICD-10-CM

## 2013-04-27 DIAGNOSIS — L03115 Cellulitis of right lower limb: Secondary | ICD-10-CM

## 2013-04-27 DIAGNOSIS — R5381 Other malaise: Secondary | ICD-10-CM

## 2013-04-27 DIAGNOSIS — E876 Hypokalemia: Secondary | ICD-10-CM

## 2013-04-27 LAB — CBC
HCT: 29.3 % — ABNORMAL LOW (ref 39.0–52.0)
Hemoglobin: 9.5 g/dL — ABNORMAL LOW (ref 13.0–17.0)
MCV: 94.2 fL (ref 78.0–100.0)
RBC: 3.11 MIL/uL — ABNORMAL LOW (ref 4.22–5.81)
WBC: 9.5 10*3/uL (ref 4.0–10.5)

## 2013-04-27 LAB — URINALYSIS, ROUTINE W REFLEX MICROSCOPIC
Bilirubin Urine: NEGATIVE
Glucose, UA: NEGATIVE mg/dL
Ketones, ur: NEGATIVE mg/dL
Nitrite: NEGATIVE
Protein, ur: 30 mg/dL — AB
Specific Gravity, Urine: 1.014 (ref 1.005–1.030)
Urobilinogen, UA: 0.2 mg/dL (ref 0.0–1.0)
pH: 5.5 (ref 5.0–8.0)

## 2013-04-27 LAB — COMPREHENSIVE METABOLIC PANEL
Albumin: 2.8 g/dL — ABNORMAL LOW (ref 3.5–5.2)
Alkaline Phosphatase: 109 U/L (ref 39–117)
BUN: 48 mg/dL — ABNORMAL HIGH (ref 6–23)
CO2: 21 mEq/L (ref 19–32)
Chloride: 106 mEq/L (ref 96–112)
Creatinine, Ser: 3.03 mg/dL — ABNORMAL HIGH (ref 0.50–1.35)
GFR calc non Af Amer: 18 mL/min — ABNORMAL LOW (ref >90)
Potassium: 4.4 mEq/L (ref 3.5–5.1)
Total Bilirubin: 0.7 mg/dL (ref 0.3–1.2)

## 2013-04-27 LAB — CG4 I-STAT (LACTIC ACID): Lactic Acid, Venous: 0.79 mmol/L (ref 0.5–2.2)

## 2013-04-27 LAB — PRO B NATRIURETIC PEPTIDE: Pro B Natriuretic peptide (BNP): 3460 pg/mL — ABNORMAL HIGH (ref 0–450)

## 2013-04-27 LAB — TROPONIN I: Troponin I: <0.3 ng/mL (ref <0.30)

## 2013-04-27 LAB — URINE MICROSCOPIC-ADD ON

## 2013-04-27 MED ORDER — VANCOMYCIN HCL IN DEXTROSE 750-5 MG/150ML-% IV SOLN: 750.0000 mg | INTRAVENOUS | Status: DC

## 2013-04-27 MED ORDER — SODIUM CHLORIDE 0.9 % IV SOLN
1000.0000 mL | Freq: Once | INTRAVENOUS | Status: AC
  Administered 2013-04-27: 1000 mL via INTRAVENOUS

## 2013-04-27 MED ORDER — LEVOFLOXACIN IN D5W 250 MG/50ML IV SOLN
250.0000 mg | INTRAVENOUS | Status: DC
  Administered 2013-04-28: 250 mg via INTRAVENOUS
  Filled 2013-04-27: qty 50

## 2013-04-27 MED ORDER — MORPHINE SULFATE 2 MG/ML IJ SOLN
1.0000 mg | INTRAMUSCULAR | Status: DC | PRN
  Administered 2013-04-27 – 2013-05-02 (×6): 1 mg via INTRAVENOUS
  Filled 2013-04-27 (×6): qty 1

## 2013-04-27 MED ORDER — SODIUM CHLORIDE 0.9 % IV SOLN: INTRAVENOUS | Status: DC

## 2013-04-27 MED ORDER — LEVOFLOXACIN IN D5W 500 MG/100ML IV SOLN
500.0000 mg | Freq: Once | INTRAVENOUS | Status: AC
  Administered 2013-04-27: 500 mg via INTRAVENOUS
  Filled 2013-04-27 (×2): qty 100

## 2013-04-27 MED ORDER — DEXTROSE 5 % IV SOLN
2.0000 g | Freq: Once | INTRAVENOUS | Status: DC
  Filled 2013-04-27: qty 2

## 2013-04-27 MED ORDER — VANCOMYCIN HCL IN DEXTROSE 1-5 GM/200ML-% IV SOLN: 1000.0000 mg | Freq: Once | INTRAVENOUS | Status: DC

## 2013-04-27 MED ORDER — TRAMADOL HCL 50 MG PO TABS: 50.0000 mg | ORAL_TABLET | Freq: Four times a day (QID) | ORAL | Status: DC | PRN

## 2013-04-27 MED ORDER — SODIUM CHLORIDE 0.9 % IV SOLN: 1000.0000 mL | INTRAVENOUS | Status: DC

## 2013-04-27 MED ORDER — SODIUM CHLORIDE 0.9 % IV SOLN: 1000.0000 mL | Freq: Once | INTRAVENOUS | Status: DC

## 2013-04-27 MED ORDER — ONDANSETRON HCL 4 MG/2ML IJ SOLN: 4.0000 mg | Freq: Four times a day (QID) | INTRAMUSCULAR | Status: DC | PRN

## 2013-04-27 MED ORDER — SODIUM CHLORIDE 0.9 % IV SOLN
INTRAVENOUS | Status: DC
  Administered 2013-04-27: 22:00:00 via INTRAVENOUS

## 2013-04-27 MED ORDER — ACETAMINOPHEN 325 MG PO TABS: 650.0000 mg | ORAL_TABLET | Freq: Once | ORAL | Status: DC

## 2013-04-27 MED ORDER — METRONIDAZOLE IN NACL 5-0.79 MG/ML-% IV SOLN: 500.0000 mg | Freq: Once | INTRAVENOUS | Status: DC

## 2013-04-27 MED ORDER — ONDANSETRON HCL 4 MG PO TABS
4.0000 mg | ORAL_TABLET | Freq: Four times a day (QID) | ORAL | Status: DC | PRN
  Administered 2013-05-01: 4 mg via ORAL
  Filled 2013-04-27 (×2): qty 1

## 2013-04-27 MED ORDER — DEXTROSE 5 % IV SOLN
1.0000 g | Freq: Three times a day (TID) | INTRAVENOUS | Status: DC
  Filled 2013-04-27: qty 1

## 2013-04-27 MED ORDER — ACETAMINOPHEN 325 MG PO TABS
650.0000 mg | ORAL_TABLET | Freq: Four times a day (QID) | ORAL | Status: DC | PRN
  Administered 2013-05-01: 650 mg via ORAL
  Filled 2013-04-27: qty 2

## 2013-04-27 MED ORDER — DEXTROSE 5 % IV SOLN
1.0000 g | INTRAVENOUS | Status: AC
  Administered 2013-04-27: 1 g via INTRAVENOUS
  Filled 2013-04-27: qty 1

## 2013-04-27 MED ORDER — METRONIDAZOLE IN NACL 5-0.79 MG/ML-% IV SOLN: 500.0000 mg | Freq: Three times a day (TID) | INTRAVENOUS | Status: DC

## 2013-04-27 MED ORDER — ALBUTEROL SULFATE (5 MG/ML) 0.5% IN NEBU
2.5000 mg | INHALATION_SOLUTION | RESPIRATORY_TRACT | Status: DC | PRN
  Administered 2013-04-29 – 2013-05-03 (×4): 2.5 mg via RESPIRATORY_TRACT
  Filled 2013-04-27 (×4): qty 0.5

## 2013-04-27 MED ORDER — ENOXAPARIN SODIUM 30 MG/0.3ML ~~LOC~~ SOLN
30.0000 mg | SUBCUTANEOUS | Status: DC
  Administered 2013-04-27 – 2013-05-04 (×8): 30 mg via SUBCUTANEOUS
  Filled 2013-04-27 (×9): qty 0.3

## 2013-04-27 NOTE — ED Notes (Signed)
Report called to 6E-- will transfer after dry pad placed-- pt incontinent of urine

## 2013-04-27 NOTE — ED Provider Notes (Addendum)
CSN: [id number]     Arrival date & time 04/27/13  1351 History     First MD Initiated Contact with Patient 04/27/13 1458     Chief Complaint  Patient presents with  . Shortness of Breath  . Fever  . Recurrent Skin Infections   (Consider location/radiation/quality/duration/timing/severity/associated sxs/prior Treatment) HPI Comments: Reportedly EMS called out for SOB, however pt denies SOB, cough.  He has had a fever, has felt worse over the past several days.  Pt reports he is at home with family and home health.  He has had h/o pneumonia in the past, denies CP, back pain.  He has been treated for B cellulitis recently, he is not sure if it ever got completely resolved. His only complaint is not feeling well and pain more in RLE than left.  No N/V/D.  No abd pain.  No HA, stiff neck, rash except for redness in legs.   Patient is a 79 y.o. male presenting with fever and leg pain. The history is provided by the patient, medical records and the EMS personnel.  Fever Associated symptoms: no chest pain, no cough, no diarrhea, no dysuria, no headaches, no rash and no vomiting   Leg Pain Pain details:    Quality:  Dull and throbbing   Radiates to:  Does not radiate   Severity:  Moderate   Onset quality:  Gradual   Progression:  Unchanged Chronicity:  Recurrent Worsened by:  Activity Associated symptoms: fatigue and fever     Past Medical History  Diagnosis Date  . Hypertension   . Arthritis   . Ex-smoker   . Rheumatoid arthritis(714.0)   . Pulmonary nodule   . DDD (degenerative disc disease)   . Complication of anesthesia     " I am difficult to wake up "  . Chronic kidney disease     acute on chronic  . Anemia    Past Surgical History  Procedure Laterality Date  . Joint replacement      bilateral knee replacement  . Hernia repair    . Back surgery      X 2  . Appendectomy     Family History  Problem Relation Age of Onset  . Coronary artery disease Brother   .  Hypertension Father   . Hypertension Mother    History  Substance Use Topics  . Smoking status: Former Smoker -- 0.50 packs/day for 30 years    Types: Cigarettes    Quit date: 10/12/1973  . Smokeless tobacco: Never Used  . Alcohol Use: No    Review of Systems  Constitutional: Positive for fever and fatigue. Negative for appetite change.  HENT: Negative for neck stiffness.   Respiratory: Negative for cough, chest tightness and shortness of breath.   Cardiovascular: Positive for leg swelling. Negative for chest pain.  Gastrointestinal: Negative for vomiting, abdominal pain and diarrhea.  Genitourinary: Negative for dysuria and flank pain.  Musculoskeletal: Positive for arthralgias.  Skin: Positive for color change. Negative for rash and wound.  Neurological: Positive for weakness. Negative for syncope, numbness and headaches.  All other systems reviewed and are negative.    Allergies  Codeine; Hydrocodone; Oxycodone; Penicillins; and Sulfa antibiotics  Home Medications   Current Outpatient Rx  Name  Route  Sig  Dispense  Refill  . albuterol (PROVENTIL) (2.5 MG/3ML) 0.083% nebulizer solution   Nebulization   Take 2.5 mg by nebulization every 4 (four) hours as needed. For shortness of breath         .  aspirin EC 81 MG tablet   Oral   Take 81 mg by mouth every morning.          . Cholecalciferol (VITAMIN D) 2000 UNITS tablet   Oral   Take 2,000 Units by mouth every morning.          . escitalopram (LEXAPRO) 10 MG tablet   Oral   Take 10 mg by mouth daily.         . flunisolide (NASAREL) 29 MCG/ACT (0.025%) nasal spray   Nasal   Place 2 sprays into the nose 2 (two) times daily. Dose is for each nostril.         . furosemide (LASIX) 20 MG tablet   Oral   Take 20 mg by mouth 2 (two) times daily. One tablet in the morning and half tablet in the evening.         . gabapentin (NEURONTIN) 100 MG capsule   Oral   Take 300 mg by mouth 2 (two) times daily.           Marland Kitchen leflunomide (ARAVA) 20 MG tablet   Oral   Take 20 mg by mouth daily.         . metoprolol tartrate (LOPRESSOR) 25 MG tablet   Oral   Take 25 mg by mouth 2 (two) times daily.         Marland Kitchen omeprazole (PRILOSEC) 20 MG capsule   Oral   Take 20 mg by mouth daily.          . predniSONE (DELTASONE) 5 MG tablet   Oral   Take 5 mg by mouth daily.         . Probiotic Product (ALIGN) 4 MG CAPS   Oral   Take 1 capsule by mouth every morning.         Marland Kitchen QUEtiapine (SEROQUEL) 25 MG tablet   Oral   Take 25 mg by mouth at bedtime.         . Skin Protectants, Misc. (EUCERIN) cream   Topical   Apply 1 application topically as needed for dry skin.         Kristin Bruins B COMPLEX/C CAPS   Oral   Take 1 capsule by mouth daily.         . Tamsulosin HCl (FLOMAX) 0.4 MG CAPS   Oral   Take 1 capsule (0.4 mg total) by mouth daily after supper.         . traMADol (ULTRAM) 50 MG tablet   Oral   Take 1 tablet (50 mg total) by mouth every 6 (six) hours as needed for pain. For pain   10 tablet   0   . trimethoprim (TRIMPEX) 100 MG tablet   Oral   Take 100 mg by mouth daily.         Marland Kitchen alendronate (FOSAMAX) 70 MG tablet   Oral   Take 70 mg by mouth every 7 (seven) days. Take with a full glass of water on an empty stomach.  On Friday         . cefUROXime (CEFTIN) 500 MG tablet   Oral   Take 1 tablet (500 mg total) by mouth 2 (two) times daily.   28 tablet   0    BP 136/63  Pulse 113  Temp(Src) 99.2 F (37.3 C) (Rectal)  Resp 14  SpO2 97% Physical Exam  Nursing note and vitals reviewed. Constitutional: He appears well-developed and well-nourished.  HENT:  Head: Normocephalic  and atraumatic.  Eyes: EOM are normal.  Dried exudate around eyelids bilaterally  Neck: Normal range of motion. Neck supple.  Cardiovascular: Regular rhythm and intact distal pulses.   Occasional extrasystoles are present. Tachycardia present.   No murmur heard. Pulmonary/Chest: Effort  normal.  Abdominal: Soft. He exhibits no distension. There is no tenderness. There is no rebound and no guarding.  Neurological: He is alert.  Skin: Skin is warm. No rash noted.    ED Course   Procedures (including critical care time)  Labs Reviewed  CBC - Abnormal; Notable for the following:    RBC 3.11 (*)    Hemoglobin 9.5 (*)    HCT 29.3 (*)    RDW 16.6 (*)    All other components within normal limits  COMPREHENSIVE METABOLIC PANEL - Abnormal; Notable for the following:    BUN 48 (*)    Creatinine, Ser 3.03 (*)    Albumin 2.8 (*)    GFR calc non Af Amer 18 (*)    GFR calc Af Amer 21 (*)    All other components within normal limits  PRO B NATRIURETIC PEPTIDE - Abnormal; Notable for the following:    Pro B Natriuretic peptide (BNP) 3460.0 (*)    All other components within normal limits  URINALYSIS, ROUTINE W REFLEX MICROSCOPIC - Abnormal; Notable for the following:    APPearance TURBID (*)    Hgb urine dipstick LARGE (*)    Protein, ur 30 (*)    Leukocytes, UA LARGE (*)    All other components within normal limits  URINE MICROSCOPIC-ADD ON - Abnormal; Notable for the following:    Squamous Epithelial / LPF FEW (*)    Bacteria, UA MANY (*)    All other components within normal limits  CULTURE, BLOOD (ROUTINE X 2)  CULTURE, BLOOD (ROUTINE X 2)  URINE CULTURE  TROPONIN I  CG4 I-STAT (LACTIC ACID)   Dg Chest 1 View  04/27/2013   *RADIOLOGY REPORT*  Clinical Data: Shortness of breath, weakness.  Lower extremity and back pain.  Fever.  CHEST - 1 VIEW  Comparison: 02/27/2013  Findings: Elevation of the right hemidiaphragm, stable.  Right base atelectasis or scarring, stable.  Heart is mildly enlarged.  Mild diffuse interstitial prominence of the lungs.  Mild vascular congestion.  No visible effusions or acute bony abnormality.  IMPRESSION: Cardiomegaly, vascular congestion.  Cannot exclude early interstitial edema.  Stable elevation of the right hemidiaphragm with right base  atelectasis or scarring.   Original Report Authenticated By: Charlett Nose, M.D.   1. Cellulitis   2. Fever   3. Renal insufficiency    ECG at time 14:06 shows sinus tachycardia with occasional PVC, LAFB, poor R wave progression between leads V2-V3, Abn ECG.  No sig change from ECG on 02/27/13.  97% on RA, I interpret to be adequate  5:03 PM Lactic acid is normal.  Renal insuff is worse.  No sig edema and no obvious infiltrate on CXR.  Will treat for complicated cellulitis since pt is presumably worse clinically despite on oral abx for cellulitis as outpt.     Spoke to Dr. Rito Ehrlich who agrees on admission.    5:22 PM Per family, pt has had h/o recurring UTI's.  UA today suggests UTI as well.  They report his urologist was considering an outpt CT scan.  I asked them to inquire with Dr. Rito Ehrlich whether that could be addressed here or not.    MDM   3:15 PM Pt  is not altered, no resp distress, lungs clear, no wheeze or cough.  Reportedly low O2 sats at home, but none here.  Clinically, pt has lower leg cellulitis R>L.  Axillary fever, pt feels hot to touch, will check lactic acid.  Does not meet sepsis criteria at this time unless change in mentation, o labs reveal high lactic acid or renal failure worse than baseline.    Gavin Pound. Oletta Lamas, MD 04/27/13 1716  Gavin Pound. Oletta Lamas, MD 04/27/13 1723  Gavin Pound. Daxter Paule, MD 04/27/13 1730

## 2013-04-27 NOTE — Progress Notes (Addendum)
ANTIBIOTIC CONSULT NOTE - INITIAL  Pharmacy Consult for vancomycin, flagyl, aztreonam Indication: sepsis  Allergies  Allergen Reactions  . Codeine Other (See Comments)     drives me crazy  . Hydrocodone Other (See Comments)    Makes patient hallucinate, cannot sleep  . Oxycodone Other (See Comments)    Makes patient hallucinate, cannot sleep  . Penicillins Hives  . Sulfa Antibiotics Other (See Comments)    Per [facility]    Patient Measurements: weight= 78 kg, height= 66 inches    Vital Signs: Temp: 100.5 F (38.1 C) (07/31 1405) Temp src: Axillary (07/31 1405) BP: 136/63 mmHg (07/31 1405) Pulse Rate: 113 (07/31 1405) Intake/Output from previous day:   Intake/Output from this shift:    Labs:  Recent Labs  04/27/13 1455  WBC 9.5  HGB 9.5*  PLT 171  CREATININE 3.03*   The CrCl is unknown because both a height and weight (above a minimum accepted value) are required for this calculation. No results found for this basename: VANCOTROUGH, VANCOPEAK, VANCORANDOM, GENTTROUGH, GENTPEAK, GENTRANDOM, TOBRATROUGH, TOBRAPEAK, TOBRARND, AMIKACINPEAK, AMIKACINTROU, AMIKACIN,  in the last 72 hours   Microbiology: No results found for this or any previous visit (from the past 720 hour(s)).  Medical History: Past Medical History  Diagnosis Date  . Hypertension   . Arthritis   . Ex-smoker   . Rheumatoid arthritis(714.0)   . Pulmonary nodule   . DDD (degenerative disc disease)   . Complication of anesthesia     " I am difficult to wake up "  . Chronic kidney disease     acute on chronic  . Anemia     Medications:  See med rec  Assessment: Patient is an 79 y.o M with hx of CKD brought in by EMS secondary to SOB, fever, and recurrent LE cellulitis.  To start broad abx for r/o sepsis.  Goal of Therapy:  Vancomycin trough level 15-20 mcg/ml  Plan:  1) vancomycin 1000mg  IV x1 now as ordered by MD, then 750mg  IV q24h 2) flagyl 500mg  IV q8h 3) aztreonam 1gm IV q8h   Lucia Gaskins 04/27/2013,4:09 PM  Adden: Changing abx to levaquin per Dr. Rito Ehrlich.  Will start Levaquin 500mg  IV x1, then 250mg  IV q24h.

## 2013-04-27 NOTE — ED Notes (Addendum)
Per [place] [facility] ems they were called for sob but states he just has leg pain , home health nurse states pulse ox low this am which she called ems. Has hx of uti that was seen here for also has red areas from laying in bed refused albuteral tx from ems

## 2013-04-27 NOTE — ED Notes (Signed)
Dr. Krishnan at bedside. 

## 2013-04-27 NOTE — H&P (Signed)
[facility] History and Physical  [name] YQM:[id number] DOB: Jan 13, 1928 DOA: 04/27/2013  Referring physician: Lenny Pastel, ER physician PCP: [name], MD  Specialists: none  Chief Complaint: weakness  HPI: [name] is a 80 y.o. male  With past medical history of COPD, recurrent UTIs and recent treatment for cellulitis who had called the EMS reportedly for shortness of breath. However it is of note the patient denies ever having shortness of breath. His main complaint was of not feeling well overall with some pain in his lower extremities. He was brought into the emergency room where he was evaluated and noted to have a documented fever of 101.4. Lab work was done patient was found to have a BUN of 48 with a creatinine of 2.03-creatinine 2 months ago was 2.1. BNP was elevated at 3460 and chest x-ray noted cardiomegaly with vascular congestion. On exam, patient was noted to have signs of persistent cellulitis in his lower extremities. In addition, he was noted to have a very large urinary tract infection. Hospitals were called for further evaluation and admission for outpatient antibiotic failure for his cellulitis.  Review of Systems: patient seen down emergency room. Doing okay. Fatigued. Main complaint is of feeling very weak. Denies any headache, vision changes, dysphasia, chest pain, palpitations, shortness of breath, wheeze, cough, abdominal pain, hematuria, dysuria. Denies any constipation or diarrhea. Complains of right knee pain, which she said started this morning. He says he occasionally has problems with both his knees from arthritis. His review systems otherwise negative.  Past Medical History  Diagnosis Date  . Hypertension   . Arthritis   . Ex-smoker   . Rheumatoid arthritis(714.0)   . Pulmonary nodule   . DDD (degenerative disc disease)   . Complication of anesthesia     " I am difficult to wake up "  . Chronic kidney disease     acute on chronic  .  Anemia    Past Surgical History  Procedure Laterality Date  . Joint replacement      bilateral knee replacement  . Hernia repair    . Back surgery      X 2  . Appendectomy     Social History:  reports that he quit smoking about 39 years ago. His smoking use included Cigarettes. He has a 15 pack-year smoking history. He has never used smokeless tobacco. He reports that he does not drink alcohol or use illicit drugs. Patient lives at home with his wife. He is able to ambulate somewhat using a walker but does not get around much  Allergies  Allergen Reactions  . Codeine Other (See Comments)     drives me crazy  . Hydrocodone Other (See Comments)    Makes patient hallucinate, cannot sleep  . Oxycodone Other (See Comments)    Makes patient hallucinate, cannot sleep  . Penicillins Hives  . Sulfa Antibiotics Other (See Comments)    Per [facility]    Family History  Problem Relation Age of Onset  . Coronary artery disease Brother   . Hypertension Father   . Hypertension Mother     Prior to Admission medications   Medication Sig Start Date End Date Taking? Authorizing Provider  albuterol (PROVENTIL) (2.5 MG/3ML) 0.083% nebulizer solution Take 2.5 mg by nebulization every 4 (four) hours as needed. For shortness of breath   Yes Historical Provider, MD  aspirin EC 81 MG tablet Take 81 mg by mouth every morning.    Yes Historical Provider, MD  Cholecalciferol (VITAMIN D) 2000 UNITS tablet Take 2,000 Units by mouth every morning.    Yes Historical Provider, MD  escitalopram (LEXAPRO) 10 MG tablet Take 10 mg by mouth daily.   Yes Historical Provider, MD  flunisolide (NASAREL) 29 MCG/ACT (0.025%) nasal spray Place 2 sprays into the nose 2 (two) times daily. Dose is for each nostril.   Yes Historical Provider, MD  furosemide (LASIX) 20 MG tablet Take 20 mg by mouth 2 (two) times daily. One tablet in the morning and half tablet in the evening.   Yes Historical Provider, MD  gabapentin (NEURONTIN)  100 MG capsule Take 300 mg by mouth 2 (two) times daily.    Yes Historical Provider, MD  leflunomide (ARAVA) 20 MG tablet Take 20 mg by mouth daily.   Yes Historical Provider, MD  metoprolol tartrate (LOPRESSOR) 25 MG tablet Take 25 mg by mouth 2 (two) times daily.   Yes Historical Provider, MD  omeprazole (PRILOSEC) 20 MG capsule Take 20 mg by mouth daily.    Yes Historical Provider, MD  predniSONE (DELTASONE) 5 MG tablet Take 5 mg by mouth daily.   Yes Historical Provider, MD  Probiotic Product (ALIGN) 4 MG CAPS Take 1 capsule by mouth every morning.   Yes Historical Provider, MD  QUEtiapine (SEROQUEL) 25 MG tablet Take 25 mg by mouth at bedtime.   Yes Historical Provider, MD  Skin Protectants, Misc. (EUCERIN) cream Apply 1 application topically as needed for dry skin.   Yes Historical Provider, MD  SUPER B COMPLEX/C CAPS Take 1 capsule by mouth daily.   Yes Historical Provider, MD  Tamsulosin HCl (FLOMAX) 0.4 MG CAPS Take 1 capsule (0.4 mg total) by mouth daily after supper. 06/13/12  Yes Vassie Loll, MD  traMADol (ULTRAM) 50 MG tablet Take 1 tablet (50 mg total) by mouth every 6 (six) hours as needed for pain. For pain 03/02/13  Yes Clydia Llano, MD  trimethoprim (TRIMPEX) 100 MG tablet Take 100 mg by mouth daily.   Yes Historical Provider, MD  alendronate (FOSAMAX) 70 MG tablet Take 70 mg by mouth every 7 (seven) days. Take with a full glass of water on an empty stomach.  On Friday    Historical Provider, MD  cefUROXime (CEFTIN) 500 MG tablet Take 1 tablet (500 mg total) by mouth 2 (two) times daily. 03/02/13   Clydia Llano, MD   Physical Exam: Filed Vitals:   04/27/13 1715 04/27/13 1730 04/27/13 1745 04/27/13 1800  BP:      Pulse: 98 96 100 99  Temp:      TempSrc:      Resp: 13 17 19 15   SpO2: 98% 99% 95% 93%     General:  Alert and oriented x3, no acute distress, dehydrated, fatigue  Eyes: sclera nonicteric, subtle movements are intact  ENT: normocephalic, atraumatic, mucous  members are quite dry and  Neck: supple, no JVD  Cardiovascular: regular rate and rhythm, occasional ectopic beat  Respiratory: clear to auscultation bilaterally, decreased breath sounds bilaterally at the bases  Abdomen: soft, nontender, nondistended, positive bowel sounds  Skin: bilaterally extremities note moderate erythema below the knees. Right greater than left  Musculoskeletal: no clubbing or cyanosis, 1+ pitting edema from the knees down. Bilateral knee scars from previous history of knee replacements  Psychiatric: patient is appropriate, no evidence of psychoses  Neurologic: no overt deficits  Labs on Admission:  Basic Metabolic Panel:  Recent Labs Lab 04/27/13 1455  NA 143  K 4.4  CL  106  CO2 21  GLUCOSE 88  BUN 48*  CREATININE 3.03*  CALCIUM 8.7   Liver Function Tests:  Recent Labs Lab 04/27/13 1455  AST 19  ALT 14  ALKPHOS 109  BILITOT 0.7  PROT 6.3  ALBUMIN 2.8*   CBC:  Recent Labs Lab 04/27/13 1455  WBC 9.5  HGB 9.5*  HCT 29.3*  MCV 94.2  PLT 171   Cardiac Enzymes:  Recent Labs Lab 04/27/13 1455  TROPONINI <0.30    BNP (last 3 results)  Recent Labs  06/05/12 0524 01/03/13 0552 04/27/13 1455  PROBNP 506.6* 602.9* 3460.0*    Radiological Exams on Admission: IMPRESSION:  Cardiomegaly, vascular congestion. Cannot exclude early  interstitial edema.  Stable elevation of the right hemidiaphragm with right base  atelectasis or scarring.    Assessment/Plan Principal Problem:   UTI (lower urinary tract infection):most likely cause of patient's infection. In the past he is burnt out Proteus mirabilis sensitive to Rocephin. We'll plan to continue this.  Patient is followed by urology and there've been concerns for possible renal obstruction. Plan was for pelvic CT. We'll check a renal ultrasound looking for hydronephrosis. Given elevated creatinine, would not be able to get a CT with contrast. Active Problems:   HYPERTENSION:  Monitor blood pressure. Patient clinically dehydrated.   Bilateral cellulitis of lower leg: Still some signs of cellulitis present, right greater than left. Will use IV Levaquin.    ARF (acute renal failure) on CKD (chronic kidney disease) stage 3, GFR 30-59 ml/min:  Creatinine 2 months ago was 2.12. Start hydration. Elevated BNP more attributed to renal failure.echocardiogram done in January 2013 was normal.    Code Status: DO NOT RESUSCITATE  Family Communication: plan discussed with patient's wife and sister at the bedside.  Disposition Plan: home possibly tomorrow  Time spent: 30 minutes  Hollice Espy [facility] Pager [phone]  If 7PM-7AM, please contact night-coverage [email or web address] Password [facility] 04/27/2013, 6:59 PM

## 2013-04-28 ENCOUNTER — Inpatient Hospital Stay (HOSPITAL_COMMUNITY): Payer: Medicare Other

## 2013-04-28 DIAGNOSIS — N139 Obstructive and reflux uropathy, unspecified: Secondary | ICD-10-CM | POA: Diagnosis present

## 2013-04-28 LAB — BASIC METABOLIC PANEL
BUN: 46 mg/dL — ABNORMAL HIGH (ref 6–23)
Chloride: 110 mEq/L (ref 96–112)
Creatinine, Ser: 2.83 mg/dL — ABNORMAL HIGH (ref 0.50–1.35)
GFR calc Af Amer: 22 mL/min — ABNORMAL LOW (ref >90)
GFR calc non Af Amer: 19 mL/min — ABNORMAL LOW (ref >90)
Potassium: 4.3 mEq/L (ref 3.5–5.1)

## 2013-04-28 LAB — CBC
HCT: 26.1 % — ABNORMAL LOW (ref 39.0–52.0)
MCHC: 32.6 g/dL (ref 30.0–36.0)
RDW: 16.7 % — ABNORMAL HIGH (ref 11.5–15.5)
WBC: 8 10*3/uL (ref 4.0–10.5)

## 2013-04-28 LAB — GLUCOSE, CAPILLARY: Glucose-Capillary: 93 mg/dL (ref 70–99)

## 2013-04-28 MED ORDER — VANCOMYCIN HCL IN DEXTROSE 1-5 GM/200ML-% IV SOLN
1000.0000 mg | INTRAVENOUS | Status: DC
  Administered 2013-04-28: 1000 mg via INTRAVENOUS
  Filled 2013-04-28 (×2): qty 200

## 2013-04-28 MED ORDER — TRAMADOL-ACETAMINOPHEN 37.5-325 MG PO TABS
1.0000 | ORAL_TABLET | Freq: Four times a day (QID) | ORAL | Status: DC | PRN
  Administered 2013-04-29 – 2013-05-05 (×10): 1 via ORAL
  Filled 2013-04-28 (×10): qty 1

## 2013-04-28 MED ORDER — TAMSULOSIN HCL 0.4 MG PO CAPS
0.4000 mg | ORAL_CAPSULE | Freq: Every day | ORAL | Status: DC
  Administered 2013-04-28 – 2013-05-04 (×6): 0.4 mg via ORAL
  Filled 2013-04-28 (×8): qty 1

## 2013-04-28 NOTE — Progress Notes (Signed)
Repeat ultrasound this afternoon shows persistent right-sided hydronephrosis. Patient having good urine output. Discussed with Dr. Wynelle Link  Again. Recommends to check UOP and renal function in am . If worsened to call back in am and he will evaluate.

## 2013-04-28 NOTE — Evaluation (Signed)
Physical Therapy Evaluation Patient Details Name: [name] MRN: [id number] DOB: 1927/09/30 Today's Date: 04/28/2013 Time: 7829-5621 PT Time Calculation (min): 19 min  PT Assessment / Plan / Recommendation History of Present Illness  79 y.o. male with PMHx of COPD, recurrent UTIs and recent treatment for cellulitis who had called the EMS reportedly for shortness of breath. However it is of note the patient denies ever having shortness of breath. His main complaint was of not feeling well overall with some pain in his lower extremities  Clinical Impression  Pt admitted with above. Pt currently with functional limitations due to the deficits listed below (see PT Problem List).  Pt will benefit from skilled PT to increase their independence and safety with mobility to allow discharge to the venue listed below.  Pt reports one of his daughters is always home to provide 24/7 supervision.  Pt currently min assist for mobility.     PT Assessment  Patient needs continued PT services    Follow Up Recommendations  Home health PT;Supervision for mobility/OOB    Does the patient have the potential to tolerate intense rehabilitation      Barriers to Discharge        Equipment Recommendations  None recommended by PT    Recommendations for Other Services     Frequency Min 3X/week    Precautions / Restrictions Precautions Precautions: Fall Precaution Comments: monitor sats   Pertinent Vitals/Pain SaO2 at rest room air 96% SaO2 upon sitting after ambulation 87% room air 2L O2 [place] reapplied and SaO2 quickly increased to 95%     Mobility  Bed Mobility Bed Mobility: Supine to Sit Supine to Sit: 5: Supervision;HOB elevated;With rails Details for Bed Mobility Assistance: increased time and effort Transfers Transfers: Sit to Stand;Stand to Sit;Stand Pivot Transfers Sit to Stand: 4: Min assist;From bed;From chair/3-in-1;With upper extremity assist Stand to Sit: 4: Min assist;With upper  extremity assist;To chair/3-in-1 Stand Pivot Transfers: 4: Min assist Details for Transfer Assistance: verbal cues for safe technique Ambulation/Gait Ambulation/Gait Assistance: 4: Min assist Ambulation Distance (Feet): 60 Feet Assistive device: Rolling walker Ambulation/Gait Assistance Details: verbal cues for safety with RW, increased SOB end of ambulation, ambulated on room air and SaO2 87% upon return to sitting so reapplied 2L O2 [place] Gait Pattern: Shuffle;Trunk flexed;Decreased dorsiflexion - right;Decreased dorsiflexion - left;Step-through pattern;Decreased stride length Gait velocity: decreased    Exercises     PT Diagnosis: Difficulty walking  PT Problem List: Decreased strength;Decreased activity tolerance;Decreased knowledge of use of DME;Decreased mobility PT Treatment Interventions: DME instruction;Gait training;Functional mobility training;Therapeutic activities;Therapeutic exercise;Balance training;Patient/family education     PT Goals(Current goals can be found in the care plan section) Acute Rehab PT Goals PT Goal Formulation: With patient Time For Goal Achievement: 05/12/13 Potential to Achieve Goals: Good  Visit Information  Last PT Received On: 04/28/13 Assistance Needed: +1 History of Present Illness: 79 y.o. male with PMHx of COPD, recurrent UTIs and recent treatment for cellulitis who had called the EMS reportedly for shortness of breath. However it is of note the patient denies ever having shortness of breath. His main complaint was of not feeling well overall with some pain in his lower extremities       Prior Functioning  Home Living Family/patient expects to be discharged to:: Private residence Living Arrangements: Children Available Help at Discharge: Available 24 hours/day;Family Type of Home: House Home Access: Ramped entrance Home Layout: One level Home Equipment: Walker - 2 wheels Additional Comments: per pt he was  not on home oxygen Prior  Function Level of Independence: Needs assistance Gait / Transfers Assistance Needed: pt states mod independent with RW ADL's / Homemaking Assistance Needed: assist for homemaking, pt reports independent with dressing and bathing Communication Communication: HOH;Expressive difficulties    Cognition  Cognition Arousal/Alertness: Awake/alert Behavior During Therapy: WFL for tasks assessed/performed Overall Cognitive Status: No family/caregiver present to determine baseline cognitive functioning    Extremity/Trunk Assessment Lower Extremity Assessment Lower Extremity Assessment: Generalized weakness   Balance    End of Session PT - End of Session Equipment Utilized During Treatment: Gait belt;Oxygen Activity Tolerance: Patient limited by fatigue Patient left: in chair;with call bell/phone within reach;with chair alarm set Nurse Communication: Mobility status  GP Functional Assessment Tool Used: clinical judgement Functional Limitation: Mobility: Walking and moving around Mobility: Walking and Moving Around Current Status (W0981): At least 20 percent but less than 40 percent impaired, limited or restricted Mobility: Walking and Moving Around Goal Status [id number]): At least 1 percent but less than 20 percent impaired, limited or restricted   [name],[name] 04/28/2013, 10:53 AM Zenovia Jarred, PT, DPT 04/28/2013 Pager: [phone]

## 2013-04-28 NOTE — Progress Notes (Addendum)
[facility] PROGRESS NOTE  HAMLIN DEVINE WUJ:[id number] DOB: 1928-04-04 DOA: 04/27/2013 PCP: [name], MD  79 year old male with a history of COPD and recurrent UTIs presented with not feeling well for process or days and pain over his lower extremities. Patient noted to be febrile with temperature of 10 58F patient found to have acute on chronic kidney EGD with chest x-ray showing vascular congestion. He also found to have persistent cellulitis in his lower extremity and UTIs. Renal ultrasound showed bilateral hydronephrosis.  Assessment/Plan: UTI Likely the source of his infection and a fever. Patient on empiric levofloxacin. Follow urine culture. Tylenol when necessary for fever. Hx of recurrent UTIs with previous culture growing Proteus which was resistant to quinolone. Will   Acute on chronic kidney injury Likely in the setting of bilateral hydronephrosis with obstructive uropathy. Foley placed with good urine output. Repeat USG pending. Follow renal function closely. Will place on flomax.  Discussed with Dr Wynelle Link who recommended to follow [place] and if hydronephrosis improving recommend Patient follows with Dr. McDiarmid as outpatient.  Cellulitis RLE Will add IV vancomycin given failed outpt therapy   Elevated proBNP Chest x-ray shows vascular congestion. We will discontinue fluids. Will obtain 2 D echo. Echo from 2013 was normal.  COPD Continue albuterol nebs when necessary.  Code Status: DNR Family Communication: NONE Disposition Plan: home with HHPT   Consultants:  None  Procedures:  None  Antibiotics:  IV Levaquin and Flagyl  HPI/Subjective: Patient seen and examined this morning. Reports feeling  tired and pain over his right leg. A Foley was placed in overnight given bilateral hydronephrosis seen on ultrasound. Patient had good urine output after Foley placed .  Objective: Filed Vitals:   04/27/13 1845 04/27/13 2019 04/28/13 0516 04/28/13 1355   BP: 118/70 158/77 132/70 131/65  Pulse: 94 106 108 103  Temp:  98.6 F (37 C) 98.3 F (36.8 C) 97.8 F (36.6 C)  TempSrc:  Oral Oral Oral  Resp: 16 16 18 19   Height:  5\' 6"  (1.676 m)    Weight:  74.1 kg (163 lb 5.8 oz)    SpO2: 93% 93% 95% 94%    Intake/Output Summary (Last 24 hours) at 04/28/13 1625 Last data filed at 04/28/13 1424  Gross per 24 hour  Intake   1865 ml  Output      0 ml  Net   1865 ml   Filed Weights   04/27/13 2019  Weight: 74.1 kg (163 lb 5.8 oz)    Exam:   General:  Elderly male in NAD  HEENT: no pallor, moist oral mucosa  Chest: clear b/l, no added sounds  CVS: S1&S2 irregular, no added sounds  Abd: soft, NT, ND, BS+, foley in place  Ext: erythema and swelling over RLE  CNS: AAOX3    Data Reviewed: Basic Metabolic Panel:  Recent Labs Lab 04/27/13 1455 04/28/13 0450  NA 143 145  K 4.4 4.3  CL 106 110  CO2 21 21  GLUCOSE 88 73  BUN 48* 46*  CREATININE 3.03* 2.83*  CALCIUM 8.7 8.3*   Liver Function Tests:  Recent Labs Lab 04/27/13 1455  AST 19  ALT 14  ALKPHOS 109  BILITOT 0.7  PROT 6.3  ALBUMIN 2.8*   No results found for this basename: LIPASE, AMYLASE,  in the last 168 hours No results found for this basename: AMMONIA,  in the last 168 hours CBC:  Recent Labs Lab 04/27/13 1455 04/28/13 0450  WBC 9.5 8.0  HGB  9.5* 8.5*  HCT 29.3* 26.1*  MCV 94.2 94.9  PLT 171 164   Cardiac Enzymes:  Recent Labs Lab 04/27/13 1455  TROPONINI <0.30   BNP (last 3 results)  Recent Labs  06/05/12 0524 01/03/13 0552 04/27/13 1455  PROBNP 506.6* 602.9* 3460.0*   CBG:  Recent Labs Lab 04/28/13 1154  GLUCAP 93    No results found for this or any previous visit (from the past 240 hour(s)).   Studies: Dg Chest 1 View  04/27/2013   *RADIOLOGY REPORT*  Clinical Data: Shortness of breath, weakness.  Lower extremity and back pain.  Fever.  CHEST - 1 VIEW  Comparison: 02/27/2013  Findings: Elevation of the right  hemidiaphragm, stable.  Right base atelectasis or scarring, stable.  Heart is mildly enlarged.  Mild diffuse interstitial prominence of the lungs.  Mild vascular congestion.  No visible effusions or acute bony abnormality.  IMPRESSION: Cardiomegaly, vascular congestion.  Cannot exclude early interstitial edema.  Stable elevation of the right hemidiaphragm with right base atelectasis or scarring.   Original Report Authenticated By: Charlett Nose, M.D.   US Renal  04/28/2013   *RADIOLOGY REPORT*  Clinical Data: Recurrent urinary tract infections and renal failure.  RENAL/URINARY TRACT ULTRASOUND COMPLETE  Comparison:  06/10/2012  Findings:  Right Kidney:  12 cm in length.  Moderately severe hydronephrosis present.  The cortex is echogenic.  Left Kidney:  10.2 cm in length.  Moderately severe hydronephrosis present.  The cortex is echogenic.  Bladder:  The bladder is irregular in appearance with irregular wall thickening and potential internal areas of septation. Underlying tumor or debris in the bladder cannot be excluded by ultrasound.  IMPRESSION: Significant bilateral hydronephrosis present.  Hydronephrosis was not present on the prior renal ultrasound.  Level of obstruction may be at the bladder where irregular wall thickening is identified.  Findings were communicated by telephone to Maren Reamer with [facility].   Original Report Authenticated By: Irish Lack, M.D.    Scheduled Meds: . acetaminophen  650 mg Oral Once  . enoxaparin (LOVENOX) injection  30 mg Subcutaneous Q24H  . levofloxacin (LEVAQUIN) IV  250 mg Intravenous Q24H  . metronidazole  500 mg Intravenous Once  . vancomycin  1,000 mg Intravenous Once   Continuous Infusions: . sodium chloride 50 mL/hr at 04/27/13 2130      Time spent: 35 minutes    Era Parr  [facility] Pager [phone] If 7PM-7AM, please contact night-coverage at [email or web address], password [facility] 04/28/2013, 4:25 PM  LOS: 1 day

## 2013-04-28 NOTE — Progress Notes (Signed)
Utilization review completed.  

## 2013-04-28 NOTE — Progress Notes (Addendum)
Pt admitted 04/27/13 with UTI and cellulitis of LEs. Pt has recently failed outpt treatment for this UTI. Abx started here. Pt apparently has a hx of urinary retention and is followed by a urologist (? name). Per H&P, there was suspicion for a renal obstruction, so pt underwent renal US (couldn't have CT secondary to known CKD and creat of 3). Renal US revealed bilateral moderately severe hydronephrosis, right greater than left (per radiologist). Obstruction suspicious for level of the bladder on [place]. Discussed findings and need to call urology with Dr. Toniann Fail. Per discussion, will place Foley first to see if output occurs. If no output after one hour, will call urology. Follow BMPs/creat. RN told to call this NP with amount of output one hour after Foley placed.  Jon Norman, NP [facility] Addendum: 400cc in hour after Foley placement. Discussed with Dr. Toniann Fail. Will r/p renal US at 9am to reeval hydronephrosis and call urology if needed. Report given to oncoming attending. Addendum: 400cc more output tonight since Foley for a total of 800cc.  KJKG, NP

## 2013-04-28 NOTE — Progress Notes (Signed)
ANTIBIOTIC CONSULT NOTE - INITIAL  Pharmacy Consult for Vancomycin Indication: Cellulitis  Allergies  Allergen Reactions  . Codeine Other (See Comments)     drives me crazy  . Hydrocodone Other (See Comments)    Makes patient hallucinate, cannot sleep  . Oxycodone Other (See Comments)    Makes patient hallucinate, cannot sleep  . Penicillins Hives  . Sulfa Antibiotics Other (See Comments)    Per [facility]    Patient Measurements: Height: 5\' 6"  (167.6 cm) Weight: 163 lb 5.8 oz (74.1 kg) IBW/kg (Calculated) : 63.8   Vital Signs: Temp: 97.8 F (36.6 C) (08/01 1355) Temp src: Oral (08/01 1355) BP: 131/65 mmHg (08/01 1355) Pulse Rate: 103 (08/01 1355) Intake/Output from previous day: 07/31 0701 - 08/01 0700 In: 1225 [I.V.:425] Out: -  Intake/Output from this shift: Total I/O In: 640 [P.O.:220; I.V.:420] Out: -   Labs:  Recent Labs  04/27/13 1455 04/28/13 0450  WBC 9.5 8.0  HGB 9.5* 8.5*  PLT 171 164  CREATININE 3.03* 2.83*   Estimated Creatinine Clearance: 18.2 ml/min (by C-G formula based on Cr of 2.83). No results found for this basename: VANCOTROUGH, VANCOPEAK, VANCORANDOM, GENTTROUGH, GENTPEAK, GENTRANDOM, TOBRATROUGH, TOBRAPEAK, TOBRARND, AMIKACINPEAK, AMIKACINTROU, AMIKACIN,  in the last 72 hours   Microbiology: No results found for this or any previous visit (from the past 720 hour(s)).  Medical History: Past Medical History  Diagnosis Date  . Hypertension   . Arthritis   . Ex-smoker   . Rheumatoid arthritis(714.0)   . Pulmonary nodule   . DDD (degenerative disc disease)   . Complication of anesthesia     " I am difficult to wake up "  . Chronic kidney disease     acute on chronic  . Anemia      Assessment: 79 y.o M with hx of CKD brought in by EMS on 7/31 d/t SOB, fever, and recurrent LE cellulitis, and currently on empiric levaquin. Pharmacy is consulted to add vancomycin for cellulitis, given failed outpt therapy with ceftin. Tm 101.4, wbc 8.0.  Urine and blood cultures are pending. Pt. With CKD, scr 2.82, est. crcl ~ 18 ml/min. Vancomycin 1g IV x 1 was ordered yesterday in the ED, but not charted, likely not given, since initial vancomycin consult is d/c'd after admission.   7/31 levaquin >> 7/31 aztreonam x 1  7/31 Blood x 2>> 7/31 Urine>>   Goal of Therapy:  Vancomycin trough level 10-15 mcg/ml  Plan:  Vancomycin 1g IV Q 48 hrs F/u renal function and cultures Check vancomycin trough at steady state if indicated.  Bayard Hugger, PharmD, BCPS  Clinical Pharmacist  Pager: [phone]   04/28/2013,5:11 PM

## 2013-04-29 ENCOUNTER — Inpatient Hospital Stay (HOSPITAL_COMMUNITY): Payer: Medicare Other

## 2013-04-29 LAB — BASIC METABOLIC PANEL
Calcium: 8.3 mg/dL — ABNORMAL LOW (ref 8.4–10.5)
Creatinine, Ser: 2.43 mg/dL — ABNORMAL HIGH (ref 0.50–1.35)
Potassium: 3.5 mEq/L (ref 3.5–5.1)

## 2013-04-29 LAB — URINE CULTURE: Colony Count: >=100000

## 2013-04-29 MED ORDER — SODIUM CHLORIDE 0.9 % IV SOLN
250.0000 mg | Freq: Four times a day (QID) | INTRAVENOUS | Status: DC
  Administered 2013-04-29 – 2013-05-01 (×9): 250 mg via INTRAVENOUS
  Filled 2013-04-29 (×11): qty 250

## 2013-04-29 MED ORDER — DIPHENHYDRAMINE HCL 25 MG PO CAPS
25.0000 mg | ORAL_CAPSULE | Freq: Three times a day (TID) | ORAL | Status: DC | PRN
  Administered 2013-04-29 – 2013-05-02 (×4): 25 mg via ORAL
  Filled 2013-04-29 (×4): qty 1

## 2013-04-29 MED ORDER — FUROSEMIDE 10 MG/ML IJ SOLN
40.0000 mg | Freq: Once | INTRAMUSCULAR | Status: AC
  Administered 2013-04-29: 40 mg via INTRAVENOUS
  Filled 2013-04-29: qty 4

## 2013-04-29 NOTE — Progress Notes (Signed)
ANTIBIOTIC CONSULT NOTE - INITIAL  Pharmacy Consult for primaxin Indication: UTI, cellulitis  Allergies  Allergen Reactions  . Codeine Other (See Comments)     drives me crazy  . Hydrocodone Other (See Comments)    Makes patient hallucinate, cannot sleep  . Oxycodone Other (See Comments)    Makes patient hallucinate, cannot sleep  . Penicillins Hives  . Sulfa Antibiotics Other (See Comments)    Per [facility]    Patient Measurements: Height: 5\' 6"  (167.6 cm) Weight: 167 lb 15.9 oz (76.2 kg) IBW/kg (Calculated) : 63.8  Vital Signs: Temp: 99.3 F (37.4 C) (08/01 2157) Temp src: Oral (08/01 2157) BP: 139/70 mmHg (08/01 2157) Pulse Rate: 111 (08/01 2157) Intake/Output from previous day: 08/01 0701 - 08/02 0700 In: 1010 [P.O.:340; I.V.:420; IV Piggyback:250] Out: 675 [Urine:675] Intake/Output from this shift:    Labs:  Recent Labs  04/27/13 1455 04/28/13 0450 04/29/13 0515  WBC 9.5 8.0  --   HGB 9.5* 8.5*  --   PLT 171 164  --   CREATININE 3.03* 2.83* 2.43*   Estimated Creatinine Clearance: 21.1 ml/min (by C-G formula based on Cr of 2.43). No results found for this basename: VANCOTROUGH, VANCOPEAK, VANCORANDOM, GENTTROUGH, GENTPEAK, GENTRANDOM, TOBRATROUGH, TOBRAPEAK, TOBRARND, AMIKACINPEAK, AMIKACINTROU, AMIKACIN,  in the last 72 hours   Microbiology: Recent Results (from the past 720 hour(s))  URINE CULTURE     Status: None   Collection Time    04/27/13  4:02 PM      Result Value Range Status   Specimen Description URINE, CATHETERIZED   Final   Special Requests NONE   Final   Culture  Setup Time 04/27/2013 17:21   Final   Colony Count >=100,000 COLONIES/ML   Final   Culture ENTEROCOCCUS SPECIES   Final   Report Status PENDING   Incomplete  CULTURE, BLOOD (ROUTINE X 2)     Status: None   Collection Time    04/27/13  4:30 PM      Result Value Range Status   Specimen Description BLOOD HAND RIGHT   Final   Special Requests BOTTLES DRAWN AEROBIC ONLY 3CC   Final    Culture  Setup Time 04/28/2013 01:20   Final   Culture     Final   Value: GRAM POSITIVE COCCI IN CLUSTERS     Note: Gram Stain Report Called to,Read Back By and Verified With:  Ranelle Oyster [id number] BRMEL   Report Status PENDING   Incomplete    Medical History: Past Medical History  Diagnosis Date  . Hypertension   . Arthritis   . Ex-smoker   . Rheumatoid arthritis(714.0)   . Pulmonary nodule   . DDD (degenerative disc disease)   . Complication of anesthesia     " I am difficult to wake up "  . Chronic kidney disease     acute on chronic  . Anemia     Medications:  Anti-infectives   Start     Dose/Rate Route Frequency Ordered Stop   04/28/13 2000  Levofloxacin (LEVAQUIN) IVPB 250 mg  Status:  Discontinued     250 mg 50 mL/hr over 60 Minutes Intravenous Every 24 hours 04/27/13 1923 04/29/13 0802   04/28/13 1800  vancomycin (VANCOCIN) IVPB 1000 mg/200 mL premix     1,000 mg 200 mL/hr over 60 Minutes Intravenous Every 48 hours 04/28/13 1721     04/28/13 1700  vancomycin (VANCOCIN) IVPB 750 mg/150 ml premix  Status:  Discontinued  750 mg 150 mL/hr over 60 Minutes Intravenous Every 24 hours 04/27/13 1629 04/27/13 1858   04/28/13 0100  metroNIDAZOLE (FLAGYL) IVPB 500 mg  Status:  Discontinued     500 mg 100 mL/hr over 60 Minutes Intravenous Every 8 hours 04/27/13 1629 04/27/13 1858   04/28/13 0100  aztreonam (AZACTAM) 1 g in dextrose 5 % 50 mL IVPB  Status:  Discontinued     1 g 100 mL/hr over 30 Minutes Intravenous Every 8 hours 04/27/13 1629 04/27/13 1858   04/27/13 1930  levofloxacin (LEVAQUIN) IVPB 500 mg     500 mg 100 mL/hr over 60 Minutes Intravenous  Once 04/27/13 1923 04/27/13 2229   04/27/13 1630  aztreonam (AZACTAM) 1 g in dextrose 5 % 50 mL IVPB     1 g 100 mL/hr over 30 Minutes Intravenous NOW 04/27/13 1627 04/27/13 1900   04/27/13 1515  aztreonam (AZACTAM) 2 g in dextrose 5 % 50 mL IVPB  Status:  Discontinued     2 g 100 mL/hr over 30 Minutes  Intravenous  Once 04/27/13 1508 04/27/13 1627   04/27/13 1515  metroNIDAZOLE (FLAGYL) IVPB 500 mg  Status:  Discontinued     500 mg 100 mL/hr over 60 Minutes Intravenous  Once 04/27/13 1508 04/28/13 1647   04/27/13 1515  vancomycin (VANCOCIN) IVPB 1000 mg/200 mL premix  Status:  Discontinued     1,000 mg 200 mL/hr over 60 Minutes Intravenous  Once 04/27/13 1508 04/28/13 1721     Assessment: 84 yom presented to the hospital with SOB, fever and recurrent cellulitis. Pt has been on multiple antibiotics since admission. Pt is currently afebrile and WBC is WNL. Changing levaquin to primaxin today and continuing vancomycin. Renal function appears to be improving.   7/31 vanc>> 8/2 Primaxin>> 7/31 flagyl>>7/31 7/31 aztreonam>>7/31 7/31 levaquin Rx>>8/2  7/31 Blood x 2>>GPC in clusters 7/31 Urine>> >100K enterococcus (sens pending)  Goal of Therapy:  Eradication of infection  Plan:  1. Primaxin 250mg  IV Q6H 2. F/u renal fxn, C&S, clinical status  Aarin Sparkman, Drake Leach 04/29/2013,8:11 AM

## 2013-04-29 NOTE — Progress Notes (Addendum)
[facility] PROGRESS NOTE  [name] UJW:[id number] DOB: 30-Jan-1928 DOA: 04/27/2013 PCP: [name], MD  Assessment/Plan: Principal Problem:   UTI (lower urinary tract infection) Active Problems:   HYPERTENSION   Bilateral cellulitis of lower leg   ARF (acute renal failure)   COPD (chronic obstructive pulmonary disease)   CKD (chronic kidney disease) stage 3, GFR 30-59 ml/min   Obstructive uropathy    79 year old male with a history of COPD and recurrent UTIs presented with not feeling well for process or days and pain over his lower extremities. Patient noted to be febrile with temperature of 10 57F patient found to have acute on chronic kidney EGD with chest x-ray showing vascular congestion. He also found to have persistent cellulitis in his lower extremity and UTIs.  Renal ultrasound showed bilateral hydronephrosis.   Assessment/Plan:  UTI  Likely the source of his infection and a fever. Patient on empiric levofloxacin. Follow urine culture. Tylenol when necessary for fever.  Previous urine culture had shown Proteus resistant to levofloxacin therefore discontinue levofloxacin and switch to imipenem  Acute on chronic kidney injury  Likely in the setting of bilateral hydronephrosis with obstructive uropathy. Foley placed with good urine output. Repeat USG shows persistent right-sided hydronephrosis . Follow renal function closely. Will place on flomax. Discussed with Dr Wynelle Link who presented a recheck urine output and renal function are stable followup Patient follows with Dr. McDiarmid as outpatient.  Creatinine slowly improving, baseline of around 1.6-2.0  Cellulitis RLE  Will add IV vancomycin given failed outpt therapy   Elevated proBNP  Chest x-ray shows vascular congestion. We will discontinue fluids. Will obtain 2 D echo. Echo from 2013 was normal. We'll give 1 dose of IV Lasix today, wheezing therefore repeat chest x-ray  COPD  Start Xopenex  treatments,  Code Status: DNR  Family Communication: NONE  Disposition Plan: home with HHPT    Consultants:  None Procedures:  None Antibiotics:  IV Levaquin and Flagyl HPI/Subjective:   Patient seen and examined this morning. Reports feeling tired and pain over his right leg. A Foley was placed in overnight given bilateral hydronephrosis seen on ultrasound   Objective: Filed Vitals:   04/27/13 2019 04/28/13 0516 04/28/13 1355 04/28/13 2157  BP: 158/77 132/70 131/65 139/70  Pulse: 106 108 103 111  Temp: 98.6 F (37 C) 98.3 F (36.8 C) 97.8 F (36.6 C) 99.3 F (37.4 C)  TempSrc: Oral Oral Oral Oral  Resp: 16 18 19 20   Height: 5\' 6"  (1.676 m)   5\' 6"  (1.676 m)  Weight: 74.1 kg (163 lb 5.8 oz)   76.2 kg (167 lb 15.9 oz)  SpO2: 93% 95% 94% 93%    Intake/Output Summary (Last 24 hours) at 04/29/13 0758 Last data filed at 04/29/13 0600  Gross per 24 hour  Intake   1010 ml  Output    675 ml  Net    335 ml    Exam:  General: Elderly male in NAD HEENT: no pallor, moist oral mucosa Chest: clear b/l, no added sounds CVS: S1&S2 irregular, no added sounds Abd: soft, NT, ND, BS+, foley in place Ext: erythema and swelling over RLE CNS: AAOX3    Data Reviewed: Basic Metabolic Panel:  Recent Labs Lab 04/27/13 1455 04/28/13 0450 04/29/13 0515  NA 143 145 145  K 4.4 4.3 3.5  CL 106 110 110  CO2 21 21 21   GLUCOSE 88 73 93  BUN 48* 46* 36*  CREATININE 3.03* 2.83* 2.43*  CALCIUM 8.7  8.3* 8.3*    Liver Function Tests:  Recent Labs Lab 04/27/13 1455  AST 19  ALT 14  ALKPHOS 109  BILITOT 0.7  PROT 6.3  ALBUMIN 2.8*   No results found for this basename: LIPASE, AMYLASE,  in the last 168 hours No results found for this basename: AMMONIA,  in the last 168 hours  CBC:  Recent Labs Lab 04/27/13 1455 04/28/13 0450  WBC 9.5 8.0  HGB 9.5* 8.5*  HCT 29.3* 26.1*  MCV 94.2 94.9  PLT 171 164    Cardiac Enzymes:  Recent Labs Lab 04/27/13 1455  TROPONINI  <0.30   BNP (last 3 results)  Recent Labs  06/05/12 0524 01/03/13 0552 04/27/13 1455  PROBNP 506.6* 602.9* 3460.0*     CBG:  Recent Labs Lab 04/28/13 1154  GLUCAP 93    Recent Results (from the past 240 hour(s))  URINE CULTURE     Status: None   Collection Time    04/27/13  4:02 PM      Result Value Range Status   Specimen Description URINE, CATHETERIZED   Final   Special Requests NONE   Final   Culture  Setup Time 04/27/2013 17:21   Final   Colony Count >=100,000 COLONIES/ML   Final   Culture ENTEROCOCCUS SPECIES   Final   Report Status PENDING   Incomplete  CULTURE, BLOOD (ROUTINE X 2)     Status: None   Collection Time    04/27/13  4:30 PM      Result Value Range Status   Specimen Description BLOOD HAND RIGHT   Final   Special Requests BOTTLES DRAWN AEROBIC ONLY 3CC   Final   Culture  Setup Time 04/28/2013 01:20   Final   Culture     Final   Value: GRAM POSITIVE COCCI IN CLUSTERS     Note: Gram Stain Report Called to,Read Back By and Verified With:  Ranelle Oyster [id number] BRMEL   Report Status PENDING   Incomplete     Studies: Dg Chest 1 View  04/27/2013   *RADIOLOGY REPORT*  Clinical Data: Shortness of breath, weakness.  Lower extremity and back pain.  Fever.  CHEST - 1 VIEW  Comparison: 02/27/2013  Findings: Elevation of the right hemidiaphragm, stable.  Right base atelectasis or scarring, stable.  Heart is mildly enlarged.  Mild diffuse interstitial prominence of the lungs.  Mild vascular congestion.  No visible effusions or acute bony abnormality.  IMPRESSION: Cardiomegaly, vascular congestion.  Cannot exclude early interstitial edema.  Stable elevation of the right hemidiaphragm with right base atelectasis or scarring.   Original Report Authenticated By: Charlett Nose, M.D.   US Renal  04/28/2013   *RADIOLOGY REPORT*  Clinical Data:  Follow up obstruction  RENAL/URINARY TRACT ULTRASOUND COMPLETE  Comparison:  04/28/2013  Findings:  Right Kidney:  Measures  11.9 cm.  There is increased cortical echogenicity. Persistent moderate right-sided hydronephrosis. This does not appear significantly improved from previous exam.  Left Kidney:  Measures 11.2 cm in length.  Increased cortical echogenicity. No change in left hydronephrosis.  Bladder:  Collapsed around a Foley catheter.  IMPRESSION:  1.  No significant change in bilateral hydronephrosis.   Original Report Authenticated By: Signa Kell, M.D.   US Renal  04/28/2013   *RADIOLOGY REPORT*  Clinical Data: Recurrent urinary tract infections and renal failure.  RENAL/URINARY TRACT ULTRASOUND COMPLETE  Comparison:  06/10/2012  Findings:  Right Kidney:  12 cm in length.  Moderately severe hydronephrosis present.  The cortex is echogenic.  Left Kidney:  10.2 cm in length.  Moderately severe hydronephrosis present.  The cortex is echogenic.  Bladder:  The bladder is irregular in appearance with irregular wall thickening and potential internal areas of septation. Underlying tumor or debris in the bladder cannot be excluded by ultrasound.  IMPRESSION: Significant bilateral hydronephrosis present.  Hydronephrosis was not present on the prior renal ultrasound.  Level of obstruction may be at the bladder where irregular wall thickening is identified.  Findings were communicated by telephone to Maren Reamer with [facility].   Original Report Authenticated By: Irish Lack, M.D.    Scheduled Meds: . acetaminophen  650 mg Oral Once  . enoxaparin (LOVENOX) injection  30 mg Subcutaneous Q24H  . levofloxacin (LEVAQUIN) IV  250 mg Intravenous Q24H  . tamsulosin  0.4 mg Oral QPC supper  . vancomycin  1,000 mg Intravenous Q48H   Continuous Infusions:   Principal Problem:   UTI (lower urinary tract infection) Active Problems:   HYPERTENSION   Bilateral cellulitis of lower leg   ARF (acute renal failure)   COPD (chronic obstructive pulmonary disease)   CKD (chronic kidney disease) stage 3, GFR 30-59 ml/min    Obstructive uropathy    Time spent: 40 minutes   [facility]  [facility] Pager [phone]. If 8PM-8AM, please contact night-coverage at [email or web address], password [email or web address] 04/29/2013, 7:58 AM  LOS: 2 days

## 2013-04-30 DIAGNOSIS — I369 Nonrheumatic tricuspid valve disorder, unspecified: Secondary | ICD-10-CM

## 2013-04-30 DIAGNOSIS — M79609 Pain in unspecified limb: Secondary | ICD-10-CM

## 2013-04-30 LAB — COMPREHENSIVE METABOLIC PANEL
ALT: 14 U/L (ref 0–53)
Alkaline Phosphatase: 139 U/L — ABNORMAL HIGH (ref 39–117)
BUN: 24 mg/dL — ABNORMAL HIGH (ref 6–23)
CO2: 26 mEq/L (ref 19–32)
GFR calc Af Amer: 34 mL/min — ABNORMAL LOW (ref >90)
GFR calc non Af Amer: 30 mL/min — ABNORMAL LOW (ref >90)
Glucose, Bld: 103 mg/dL — ABNORMAL HIGH (ref 70–99)
Potassium: 3.8 mEq/L (ref 3.5–5.1)
Total Protein: 5.5 g/dL — ABNORMAL LOW (ref 6.0–8.3)

## 2013-04-30 LAB — CULTURE, BLOOD (ROUTINE X 2)

## 2013-04-30 MED ORDER — HYDROCERIN EX CREA
TOPICAL_CREAM | Freq: Two times a day (BID) | CUTANEOUS | Status: DC
  Administered 2013-04-30 – 2013-05-05 (×11): via TOPICAL
  Filled 2013-04-30: qty 113

## 2013-04-30 MED ORDER — VANCOMYCIN HCL 500 MG IV SOLR
500.0000 mg | INTRAVENOUS | Status: DC
  Administered 2013-04-30: 500 mg via INTRAVENOUS
  Filled 2013-04-30 (×2): qty 500

## 2013-04-30 NOTE — Progress Notes (Signed)
  Echocardiogram 2D Echocardiogram has been performed.  [name] 04/30/2013, 3:54 PM

## 2013-04-30 NOTE — Progress Notes (Signed)
VASCULAR LAB PRELIMINARY  ARTERIAL  ABI completed: Within normal limits.    RIGHT    LEFT    PRESSURE WAVEFORM  PRESSURE WAVEFORM  BRACHIAL 146 Tri BRACHIAL 155 Tri  DP   DP    AT 191 Tri AT 179 Tri  PT 183 Tri PT 207 Tri  PER   PER    GREAT TOE  NA GREAT TOE  NA    RIGHT LEFT  ABI 1.23 1.34     Farrel Demark, RDMS, RVT  04/30/2013, 10:27 AM

## 2013-04-30 NOTE — Progress Notes (Signed)
ANTIBIOTIC CONSULT NOTE - FOLLOW UP  Pharmacy Consult for vancomycin and Primaxin Indication: UTI, cellulitis  Allergies  Allergen Reactions  . Codeine Other (See Comments)     drives me crazy  . Hydrocodone Other (See Comments)    Makes patient hallucinate, cannot sleep  . Oxycodone Other (See Comments)    Makes patient hallucinate, cannot sleep  . Penicillins Hives  . Sulfa Antibiotics Other (See Comments)    Per [facility]    Patient Measurements: Height: 5\' 6"  (167.6 cm) Weight: 165 lb 2 oz (74.9 kg) IBW/kg (Calculated) : 63.8  Vital Signs: Temp: 97.5 F (36.4 C) (08/03 0936) Temp src: Oral (08/03 0936) BP: 139/54 mmHg (08/03 0936) Pulse Rate: 106 (08/03 0936) Intake/Output from previous day: 08/02 0701 - 08/03 0700 In: 480 [P.O.:480] Out: 3850 [Urine:3850] Intake/Output from this shift: Total I/O In: 240 [P.O.:240] Out: 1000 [Urine:1000]  Labs:  Recent Labs  04/27/13 1455 04/28/13 0450 04/29/13 0515 04/30/13 0940  WBC 9.5 8.0  --   --   HGB 9.5* 8.5*  --   --   PLT 171 164  --   --   CREATININE 3.03* 2.83* 2.43* 1.98*   Estimated Creatinine Clearance: 26 ml/min (by C-G formula based on Cr of 1.98).   Microbiology: Recent Results (from the past 720 hour(s))  URINE CULTURE     Status: None   Collection Time    04/27/13  4:02 PM      Result Value Range Status   Specimen Description URINE, CATHETERIZED   Final   Special Requests NONE   Final   Culture  Setup Time 04/27/2013 17:21   Final   Colony Count >=100,000 COLONIES/ML   Final   Culture ENTEROCOCCUS SPECIES   Final   Report Status 04/29/2013 FINAL   Final   Organism ID, Bacteria ENTEROCOCCUS SPECIES   Final  CULTURE, BLOOD (ROUTINE X 2)     Status: None   Collection Time    04/27/13  4:24 PM      Result Value Range Status   Specimen Description BLOOD ARM RIGHT   Final   Special Requests BOTTLES DRAWN AEROBIC ONLY 9CC   Final   Culture  Setup Time 04/28/2013 01:20   Final   Culture     Final   Value:        BLOOD CULTURE RECEIVED NO GROWTH TO DATE CULTURE WILL BE HELD FOR 5 DAYS BEFORE ISSUING A FINAL NEGATIVE REPORT   Report Status PENDING   Incomplete  CULTURE, BLOOD (ROUTINE X 2)     Status: None   Collection Time    04/27/13  4:30 PM      Result Value Range Status   Specimen Description BLOOD HAND RIGHT   Final   Special Requests BOTTLES DRAWN AEROBIC ONLY 3CC   Final   Culture  Setup Time 04/28/2013 01:20   Final   Culture     Final   Value: STAPHYLOCOCCUS SPECIES (COAGULASE NEGATIVE)     Note: THE SIGNIFICANCE OF ISOLATING THIS ORGANISM FROM A SINGLE SET OF BLOOD CULTURES WHEN MULTIPLE SETS ARE DRAWN IS UNCERTAIN. PLEASE NOTIFY THE MICROBIOLOGY DEPARTMENT WITHIN ONE WEEK IF SPECIATION AND SENSITIVITIES ARE REQUIRED.     Note: Gram Stain Report Called to,Read Back By and Verified With:  Ranelle Oyster [id number] BRMEL   Report Status 04/30/2013 FINAL   Final    Anti-infectives   Start     Dose/Rate Route Frequency Ordered Stop   04/29/13 0900  imipenem-cilastatin (PRIMAXIN) 250 mg in sodium chloride 0.9 % 100 mL IVPB     250 mg 200 mL/hr over 30 Minutes Intravenous Every 6 hours 04/29/13 0813     04/28/13 2000  Levofloxacin (LEVAQUIN) IVPB 250 mg  Status:  Discontinued     250 mg 50 mL/hr over 60 Minutes Intravenous Every 24 hours 04/27/13 1923 04/29/13 0802   04/28/13 1800  vancomycin (VANCOCIN) IVPB 1000 mg/200 mL premix     1,000 mg 200 mL/hr over 60 Minutes Intravenous Every 48 hours 04/28/13 1721     04/28/13 1700  vancomycin (VANCOCIN) IVPB 750 mg/150 ml premix  Status:  Discontinued     750 mg 150 mL/hr over 60 Minutes Intravenous Every 24 hours 04/27/13 1629 04/27/13 1858   04/28/13 0100  metroNIDAZOLE (FLAGYL) IVPB 500 mg  Status:  Discontinued     500 mg 100 mL/hr over 60 Minutes Intravenous Every 8 hours 04/27/13 1629 04/27/13 1858   04/28/13 0100  aztreonam (AZACTAM) 1 g in dextrose 5 % 50 mL IVPB  Status:  Discontinued     1 g 100 mL/hr over 30  Minutes Intravenous Every 8 hours 04/27/13 1629 04/27/13 1858   04/27/13 1930  levofloxacin (LEVAQUIN) IVPB 500 mg     500 mg 100 mL/hr over 60 Minutes Intravenous  Once 04/27/13 1923 04/27/13 2229   04/27/13 1630  aztreonam (AZACTAM) 1 g in dextrose 5 % 50 mL IVPB     1 g 100 mL/hr over 30 Minutes Intravenous NOW 04/27/13 1627 04/27/13 1900   04/27/13 1515  aztreonam (AZACTAM) 2 g in dextrose 5 % 50 mL IVPB  Status:  Discontinued     2 g 100 mL/hr over 30 Minutes Intravenous  Once 04/27/13 1508 04/27/13 1627   04/27/13 1515  metroNIDAZOLE (FLAGYL) IVPB 500 mg  Status:  Discontinued     500 mg 100 mL/hr over 60 Minutes Intravenous  Once 04/27/13 1508 04/28/13 1647   04/27/13 1515  vancomycin (VANCOCIN) IVPB 1000 mg/200 mL premix  Status:  Discontinued     1,000 mg 200 mL/hr over 60 Minutes Intravenous  Once 04/27/13 1508 04/28/13 1721     Assessment: 84 YOM being treated for recurrent cellulitis and UTI. Renal function has improved drastically- SCr today 1.98 with CrCl ~65mL/min. WBC 8 and pt afebrile. Urine culture shows enterococus sensitive to vancomycin and 1/2 blood culture grew coag neg staph.  Goal of Therapy:  Vancomycin trough level 10-15 mcg/ml  Plan:  1.Change vancomycin to 500mg  IV Q24h 2.Continue Primaxin 250mg  IV Q6h 3. Follow up renal function, LOT, and clinical progression  Annaliesa Blann D. Cece Milhouse, PharmD Clinical Pharmacist Pager: [phone] 04/30/2013 12:39 PM

## 2013-04-30 NOTE — Progress Notes (Addendum)
[facility] PROGRESS NOTE  [name] ZOX:[id number] DOB: 1928/06/09 DOA: 04/27/2013 PCP: [name], MD  Assessment/Plan: Principal Problem:   UTI (lower urinary tract infection) Active Problems:   HYPERTENSION   Bilateral cellulitis of lower leg   ARF (acute renal failure)   COPD (chronic obstructive pulmonary disease)   CKD (chronic kidney disease) stage 3, GFR 30-59 ml/min   Obstructive uropathy     79 year old male with a history of COPD and recurrent UTIs presented with not feeling well for process or days and pain over his lower extremities. Patient noted to be febrile with temperature of 10 36F patient found to have acute on chronic kidney EGD with chest x-ray showing vascular congestion. He also found to have persistent cellulitis in his lower extremity and UTIs.  Renal ultrasound showed bilateral hydronephrosis.   Assessment/Plan:  UTI  Likely the source of his infection and a fever. Patient on empiric imipenem and vancomycin. Urine culture showing enterococcus resistant to ampicillin.. Tylenol when necessary for fever.  Previous urine culture had shown Proteus resistant to levofloxacin therefore discontinue levofloxacin and switch to imipenem and vancomycin   Acute on chronic kidney injury  Likely in the setting of bilateral hydronephrosis with obstructive uropathy. Foley placed with good urine output. Repeat CT  shows persistent right-sided hydronephrosis  . Follow renal function closely. Will place on flomax. Discussed with Dr Wynelle Link will see the patient today. Patient follows with Dr. McDiarmid as outpatient.  Creatinine slowly improving, baseline of around 1.6-2.0   Cellulitis RLE  Will add IV vancomycin given failed outpt therapy  ABI normal  Elevated proBNP  Chest x-ray shows vascular congestion. We will discontinue fluids. Will obtain 2 D echo. Echo from 2013 was normal. Was given one dose of Lasix 8/2, wheezing therefore repeat chest x-ray   COPD   Start Xopenex treatments,    Code Status: DNR  Family Communication: NONE  Disposition Plan: home with HHPT  Consultants:  None Procedures:  None Antibiotics:  IV Levaquin and Flagyl   HPI/Subjective: Less wheezing or shortness of breath today  Objective: Filed Vitals:   04/29/13 0925 04/29/13 1325 04/29/13 2206 04/30/13 0510  BP: 134/70 137/71 132/57 137/89  Pulse: 105 100 75 113  Temp: 98.8 F (37.1 C) 99 F (37.2 C) 97.1 F (36.2 C) 98.7 F (37.1 C)  TempSrc: Oral Oral Oral Oral  Resp: 18 18 18 18   Height:      Weight:   74.9 kg (165 lb 2 oz)   SpO2: 93% 94% 98% 100%    Intake/Output Summary (Last 24 hours) at 04/30/13 0826 Last data filed at 04/29/13 1700  Gross per 24 hour  Intake    480 ml  Output   3850 ml  Net  -3370 ml    Exam:  HENT:  Head: Atraumatic.  Nose: Nose normal.  Mouth/Throat: Oropharynx is clear and moist.  Eyes: Conjunctivae are normal. Pupils are equal, round, and reactive to light. No scleral icterus.  Neck: Neck supple. No tracheal deviation present.  Cardiovascular: Normal rate, regular rhythm, normal heart sounds and intact distal pulses.  Pulmonary/Chest: Effort normal and breath sounds normal. No respiratory distress.  Abdominal: Soft. Normal appearance and bowel sounds are normal. She exhibits no distension. There is no tenderness.  Musculoskeletal: She exhibits no edema and no tenderness.  Neurological: She is alert. No cranial nerve deficit.    Data Reviewed: Basic Metabolic Panel:  Recent Labs Lab 04/27/13 1455 04/28/13 0450 04/29/13 0515  NA 143 145  145  K 4.4 4.3 3.5  CL 106 110 110  CO2 21 21 21   GLUCOSE 88 73 93  BUN 48* 46* 36*  CREATININE 3.03* 2.83* 2.43*  CALCIUM 8.7 8.3* 8.3*    Liver Function Tests:  Recent Labs Lab 04/27/13 1455  AST 19  ALT 14  ALKPHOS 109  BILITOT 0.7  PROT 6.3  ALBUMIN 2.8*   No results found for this basename: LIPASE, AMYLASE,  in the last 168 hours No results  found for this basename: AMMONIA,  in the last 168 hours  CBC:  Recent Labs Lab 04/27/13 1455 04/28/13 0450  WBC 9.5 8.0  HGB 9.5* 8.5*  HCT 29.3* 26.1*  MCV 94.2 94.9  PLT 171 164    Cardiac Enzymes:  Recent Labs Lab 04/27/13 1455  TROPONINI <0.30   BNP (last 3 results)  Recent Labs  06/05/12 0524 01/03/13 0552 04/27/13 1455  PROBNP 506.6* 602.9* 3460.0*     CBG:  Recent Labs Lab 04/28/13 1154  GLUCAP 93    Recent Results (from the past 240 hour(s))  URINE CULTURE     Status: None   Collection Time    04/27/13  4:02 PM      Result Value Range Status   Specimen Description URINE, CATHETERIZED   Final   Special Requests NONE   Final   Culture  Setup Time 04/27/2013 17:21   Final   Colony Count >=100,000 COLONIES/ML   Final   Culture ENTEROCOCCUS SPECIES   Final   Report Status 04/29/2013 FINAL   Final   Organism ID, Bacteria ENTEROCOCCUS SPECIES   Final  CULTURE, BLOOD (ROUTINE X 2)     Status: None   Collection Time    04/27/13  4:24 PM      Result Value Range Status   Specimen Description BLOOD ARM RIGHT   Final   Special Requests BOTTLES DRAWN AEROBIC ONLY 9CC   Final   Culture  Setup Time 04/28/2013 01:20   Final   Culture     Final   Value:        BLOOD CULTURE RECEIVED NO GROWTH TO DATE CULTURE WILL BE HELD FOR 5 DAYS BEFORE ISSUING A FINAL NEGATIVE REPORT   Report Status PENDING   Incomplete  CULTURE, BLOOD (ROUTINE X 2)     Status: None   Collection Time    04/27/13  4:30 PM      Result Value Range Status   Specimen Description BLOOD HAND RIGHT   Final   Special Requests BOTTLES DRAWN AEROBIC ONLY 3CC   Final   Culture  Setup Time 04/28/2013 01:20   Final   Culture     Final   Value: GRAM POSITIVE COCCI IN CLUSTERS     Note: Gram Stain Report Called to,Read Back By and Verified With:  Ranelle Oyster [id number] BRMEL   Report Status PENDING   Incomplete     Studies: Ct Abdomen Pelvis Wo Contrast  04/29/2013   *RADIOLOGY REPORT*   Clinical Data: Abdominal pain  CT ABDOMEN AND PELVIS WITHOUT CONTRAST  Technique:  Multidetector CT imaging of the abdomen and pelvis was performed following the standard protocol without intravenous contrast.  Comparison: 04/28/2013, 06/07/2012  Findings: The lung bases demonstrate multiple subpleural nodules and mild atelectasis in the right lung base. These changes are stable from prior exam  The liver, spleen, adrenal glands and pancreas are within normal limits.  The gallbladder is peripherally calcified and contracted. These changes are stable  from prior examination.  Bilateral hydronephrosis is noted.  This is stable from the ultrasound from previous day.  The ureters are dilated to the level of the urinary bladder which is decompressed by Foley catheter. This raises suspicion for bladder abnormality as the etiology of the hydronephrosis.  An abdominal wall hernia is identified containing some bowel loops without incarceration.  These changes are stable from prior exam. The appendix is not well visualized and likely surgically removed. Diverticular change without diverticulitis is noted.  No pelvic mass lesion is seen.  IMPRESSION: Persistent bilateral hydronephrosis and hydroureter as described. These changes persist despite decompression by Foley catheter of the bladder.  There may be distal ureteral stenosis.  Changes in lung bases bilaterally stable from prior exam.  CT of the chest for further characterization may be helpful   Original Report Authenticated By: Alcide Clever, M.D.   Dg Chest 1 View  04/27/2013   *RADIOLOGY REPORT*  Clinical Data: Shortness of breath, weakness.  Lower extremity and back pain.  Fever.  CHEST - 1 VIEW  Comparison: 02/27/2013  Findings: Elevation of the right hemidiaphragm, stable.  Right base atelectasis or scarring, stable.  Heart is mildly enlarged.  Mild diffuse interstitial prominence of the lungs.  Mild vascular congestion.  No visible effusions or acute bony  abnormality.  IMPRESSION: Cardiomegaly, vascular congestion.  Cannot exclude early interstitial edema.  Stable elevation of the right hemidiaphragm with right base atelectasis or scarring.   Original Report Authenticated By: Charlett Nose, M.D.   US Renal  04/28/2013   *RADIOLOGY REPORT*  Clinical Data:  Follow up obstruction  RENAL/URINARY TRACT ULTRASOUND COMPLETE  Comparison:  04/28/2013  Findings:  Right Kidney:  Measures 11.9 cm.  There is increased cortical echogenicity. Persistent moderate right-sided hydronephrosis. This does not appear significantly improved from previous exam.  Left Kidney:  Measures 11.2 cm in length.  Increased cortical echogenicity. No change in left hydronephrosis.  Bladder:  Collapsed around a Foley catheter.  IMPRESSION:  1.  No significant change in bilateral hydronephrosis.   Original Report Authenticated By: Signa Kell, M.D.   US Renal  04/28/2013   *RADIOLOGY REPORT*  Clinical Data: Recurrent urinary tract infections and renal failure.  RENAL/URINARY TRACT ULTRASOUND COMPLETE  Comparison:  06/10/2012  Findings:  Right Kidney:  12 cm in length.  Moderately severe hydronephrosis present.  The cortex is echogenic.  Left Kidney:  10.2 cm in length.  Moderately severe hydronephrosis present.  The cortex is echogenic.  Bladder:  The bladder is irregular in appearance with irregular wall thickening and potential internal areas of septation. Underlying tumor or debris in the bladder cannot be excluded by ultrasound.  IMPRESSION: Significant bilateral hydronephrosis present.  Hydronephrosis was not present on the prior renal ultrasound.  Level of obstruction may be at the bladder where irregular wall thickening is identified.  Findings were communicated by telephone to Maren Reamer with [facility].   Original Report Authenticated By: Irish Lack, M.D.    Scheduled Meds: . acetaminophen  650 mg Oral Once  . enoxaparin (LOVENOX) injection  30 mg Subcutaneous Q24H  .  imipenem-cilastatin  250 mg Intravenous Q6H  . tamsulosin  0.4 mg Oral QPC supper  . vancomycin  1,000 mg Intravenous Q48H   Continuous Infusions:   Principal Problem:   UTI (lower urinary tract infection) Active Problems:   HYPERTENSION   Bilateral cellulitis of lower leg   ARF (acute renal failure)   COPD (chronic obstructive pulmonary disease)   CKD (chronic  kidney disease) stage 3, GFR 30-59 ml/min   Obstructive uropathy    Time spent: 40 minutes   [facility]  [facility] Pager [phone]. If 8PM-8AM, please contact night-coverage at [email or web address], password [facility] 04/30/2013, 8:26 AM  LOS: 3 days

## 2013-04-30 NOTE — Consult Note (Signed)
Urology Consult  CC: Referring physician: Dr Susie Cassette Reason for referral: Bilateral hydronephrosis  History of Present Illness: [name] is an 79 year old male who is currently being treated by Dr. Sherron Monday. He presented to the emergency room on 04/27/13 with complaints of lower extremity discomfort and not feeling well. He was found to have a fever and what appeared to be a UTI. In addition his creatinine was noted be elevated to 3.03. He was admitted to the hospital and a renal ultrasound was obtained which revealed bilateral hydronephrosis. There was no evidence of stone and he had no history of kidney stones. He saw Dr. Sherron Monday last on 04/13/13 at which time a renal ultrasound was obtained which did reveal the presence of hydronephrosis it seemed greater on the left at that time. He was urinating without significant change in his voiding pattern and a urine culture was obtained that was positive for Proteus. He was scheduled for a followup CT urogram to rule out stones. He does have a past history of recurrent/chronic UTIs.  Past Medical History  Diagnosis Date  . Hypertension   . Arthritis   . Ex-smoker   . Rheumatoid arthritis(714.0)   . Pulmonary nodule   . DDD (degenerative disc disease)   . Complication of anesthesia     " I am difficult to wake up "  . Chronic kidney disease     acute on chronic  . Anemia    Past Surgical History  Procedure Laterality Date  . Joint replacement      bilateral knee replacement  . Hernia repair    . Back surgery      X 2  . Appendectomy      Medications:  Scheduled: . acetaminophen  650 mg Oral Once  . enoxaparin (LOVENOX) injection  30 mg Subcutaneous Q24H  . imipenem-cilastatin  250 mg Intravenous Q6H  . tamsulosin  0.4 mg Oral QPC supper  . vancomycin  1,000 mg Intravenous Q48H    Allergies:  Allergies  Allergen Reactions  . Codeine Other (See Comments)     drives me crazy  . Hydrocodone Other (See Comments)    Makes  patient hallucinate, cannot sleep  . Oxycodone Other (See Comments)    Makes patient hallucinate, cannot sleep  . Penicillins Hives  . Sulfa Antibiotics Other (See Comments)    Per MAR    Family History  Problem Relation Age of Onset  . Coronary artery disease Brother   . Hypertension Father   . Hypertension Mother     Social History:  reports that he quit smoking about 39 years ago. His smoking use included Cigarettes. He has a 15 pack-year smoking history. He has never used smokeless tobacco. He reports that he does not drink alcohol or use illicit drugs.  Review of Systems: Pertinent items are noted in HPI. A comprehensive review of systems was negative except as noted above  Physical Exam:  Vital signs in last 24 hours: Temp:  [97.1 F (36.2 C)-99 F (37.2 C)] 97.5 F (36.4 C) (08/03 0936) Pulse Rate:  [75-113] 106 (08/03 0936) Resp:  [18] 18 (08/03 0936) BP: (132-139)/(54-89) 139/54 mmHg (08/03 0936) SpO2:  [94 %-100 %] 100 % (08/03 0936) Weight:  [74.9 kg (165 lb 2 oz)] 74.9 kg (165 lb 2 oz) (08/02 2206) General appearance: alert and appears stated age Head: Normocephalic, without obvious abnormality, atraumatic Eyes: conjunctivae/corneas clear. EOM's intact.  Oropharynx: moist mucous membranes Neck: supple, symmetrical, trachea midline Resp: normal respiratory effort Cardio:  regular rate and rhythm Back: symmetric, no curvature. ROM normal. No CVA tenderness. GI: soft, non-tender; bowel sounds normal; no masses,  no organomegaly  Male genitalia: penis: normal male phallus with Foley catheter indwelling draining clear urine.Testes: bilaterally descended with no masses or tenderness. no hernias  Extremities: extremities normal, atraumatic, no cyanosis or edema Skin: Skin color normal. No visible rashes or lesions Neurologic: Grossly normal  Laboratory Data:   Recent Labs  04/27/13 1455 04/28/13 0450  WBC 9.5 8.0  HGB 9.5* 8.5*  HCT 29.3* 26.1*    BMET  Recent Labs  04/29/13 0515 04/30/13 0940  NA 145 142  K 3.5 3.8  CL 110 108  CO2 21 26  GLUCOSE 93 103*  BUN 36* 24*  CREATININE 2.43* 1.98*  CALCIUM 8.3* 8.4   No results found for this basename: LABPT, INR,  in the last 72 hours No results found for this basename: LABURIN,  in the last 72 hours Results for orders placed during the hospital encounter of 04/27/13  URINE CULTURE     Status: None   Collection Time    04/27/13  4:02 PM      Result Value Range Status   Specimen Description URINE, CATHETERIZED   Final   Special Requests NONE   Final   Culture  Setup Time 04/27/2013 17:21   Final   Colony Count >=100,000 COLONIES/ML   Final   Culture ENTEROCOCCUS SPECIES   Final   Report Status 04/29/2013 FINAL   Final   Organism ID, Bacteria ENTEROCOCCUS SPECIES   Final  CULTURE, BLOOD (ROUTINE X 2)     Status: None   Collection Time    04/27/13  4:24 PM      Result Value Range Status   Specimen Description BLOOD ARM RIGHT   Final   Special Requests BOTTLES DRAWN AEROBIC ONLY 9CC   Final   Culture  Setup Time 04/28/2013 01:20   Final   Culture     Final   Value:        BLOOD CULTURE RECEIVED NO GROWTH TO DATE CULTURE WILL BE HELD FOR 5 DAYS BEFORE ISSUING A FINAL NEGATIVE REPORT   Report Status PENDING   Incomplete  CULTURE, BLOOD (ROUTINE X 2)     Status: None   Collection Time    04/27/13  4:30 PM      Result Value Range Status   Specimen Description BLOOD HAND RIGHT   Final   Special Requests BOTTLES DRAWN AEROBIC ONLY 3CC   Final   Culture  Setup Time 04/28/2013 01:20   Final   Culture     Final   Value: STAPHYLOCOCCUS SPECIES (COAGULASE NEGATIVE)     Note: THE SIGNIFICANCE OF ISOLATING THIS ORGANISM FROM A SINGLE SET OF BLOOD CULTURES WHEN MULTIPLE SETS ARE DRAWN IS UNCERTAIN. PLEASE NOTIFY THE MICROBIOLOGY DEPARTMENT WITHIN ONE WEEK IF SPECIATION AND SENSITIVITIES ARE REQUIRED.     Note: Gram Stain Report Called to,Read Back By and Verified With:  Ranelle Oyster [id number] BRMEL   Report Status 04/30/2013 FINAL   Final   Creatinine:  Recent Labs  04/27/13 1455 04/28/13 0450 04/29/13 0515 04/30/13 0940  CREATININE 3.03* 2.83* 2.43* 1.98*    Imaging: Ct Abdomen Pelvis Wo Contrast  04/29/2013   *RADIOLOGY REPORT*  Clinical Data: Abdominal pain  CT ABDOMEN AND PELVIS WITHOUT CONTRAST  Technique:  Multidetector CT imaging of the abdomen and pelvis was performed following the standard protocol without intravenous contrast.  Comparison: 04/28/2013,  06/07/2012  Findings: The lung bases demonstrate multiple subpleural nodules and mild atelectasis in the right lung base. These changes are stable from prior exam  The liver, spleen, adrenal glands and pancreas are within normal limits.  The gallbladder is peripherally calcified and contracted. These changes are stable from prior examination.  Bilateral hydronephrosis is noted.  This is stable from the ultrasound from previous day.  The ureters are dilated to the level of the urinary bladder which is decompressed by Foley catheter. This raises suspicion for bladder abnormality as the etiology of the hydronephrosis.  An abdominal wall hernia is identified containing some bowel loops without incarceration.  These changes are stable from prior exam. The appendix is not well visualized and likely surgically removed. Diverticular change without diverticulitis is noted.  No pelvic mass lesion is seen.  IMPRESSION: Persistent bilateral hydronephrosis and hydroureter as described. These changes persist despite decompression by Foley catheter of the bladder.  There may be distal ureteral stenosis.  Changes in lung bases bilaterally stable from prior exam.  CT of the chest for further characterization may be helpful   Original Report Authenticated By: Alcide Clever, M.D.   US Renal  04/28/2013   *RADIOLOGY REPORT*  Clinical Data:  Follow up obstruction  RENAL/URINARY TRACT ULTRASOUND COMPLETE  Comparison:  04/28/2013   Findings:  Right Kidney:  Measures 11.9 cm.  There is increased cortical echogenicity. Persistent moderate right-sided hydronephrosis. This does not appear significantly improved from previous exam.  Left Kidney:  Measures 11.2 cm in length.  Increased cortical echogenicity. No change in left hydronephrosis.  Bladder:  Collapsed around a Foley catheter.  IMPRESSION:  1.  No significant change in bilateral hydronephrosis.   Original Report Authenticated By: Signa Kell, M.D.    Impression/Assessment:  I independently reviewed his renal ultrasound as well as CT scan and laboratory results as follows. I then discussed the situation with both the patient and his daughter who is with him today. He had bilateral hydronephrosis on his initial renal ultrasound and after Foley catheter was placed a followup renal ultrasound revealed continued bilateral hydronephrosis. At his daughter's insistence a CT scan was obtained. This revealed continued bilateral hydronephrosis with good preservation of overlying parenchyma bilaterally. No renal calculi were noted within the kidneys or within the ureters. Both ureters were dilated down to the ureterovesical junction. The bladder was thickened consistent with long-standing outlet obstruction and possibly also do to acute cystitis. I note the following pattern regarding his serum creatinine: In 9/13 his creatinine was 3.31 and felt a 0.96. Later in 9/13 his creatinine was noted be 4.04 and then felt a 4.48 In 4/14 his creatinine was 2.50 and fell to 1.63 In 6/14 his creatinine was 3.39 and fell to 2.12 04/27/13 - 3.03 04/28/13 - 2.83 04/29/13 - 2.43 04/30/13 - 1.98 It is my opinion, which I have discussed with the patient and his daughter, that placing bilateral double-J stent under anesthesia it is not indicated. 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 He  has nothing to suggest urosepsis. the When he was in the office about 2 weeks ago he had a Proteus UTI. This would indicate recurrent infections due to different organisms rather than a persistent infection to do a nidus of infection that is not allowing clearance of that infection. Plan:  1. Continue Foley catheter drainage for now. 2. Continue nitrofurantoin for treatment of his enterococcus UTI upon discharge. 3. He has an appointment with Dr. Sherron Monday this Friday for followup if he is discharged. Otherwise I will make Dr. Sherron Monday aware of his admission.   [name] C 04/30/2013, 12:19 PM

## 2013-05-01 ENCOUNTER — Inpatient Hospital Stay (HOSPITAL_COMMUNITY): Payer: Medicare Other

## 2013-05-01 DIAGNOSIS — L03119 Cellulitis of unspecified part of limb: Secondary | ICD-10-CM

## 2013-05-01 DIAGNOSIS — R5381 Other malaise: Secondary | ICD-10-CM

## 2013-05-01 LAB — BASIC METABOLIC PANEL
CO2: 25 mEq/L (ref 19–32)
Calcium: 8.5 mg/dL (ref 8.4–10.5)
Creatinine, Ser: 1.82 mg/dL — ABNORMAL HIGH (ref 0.50–1.35)
GFR calc Af Amer: 38 mL/min — ABNORMAL LOW (ref >90)
GFR calc non Af Amer: 33 mL/min — ABNORMAL LOW (ref >90)
Sodium: 143 mEq/L (ref 135–145)

## 2013-05-01 MED ORDER — DOXYCYCLINE HYCLATE 100 MG PO TABS: 100.0000 mg | ORAL_TABLET | Freq: Two times a day (BID) | ORAL | Status: DC

## 2013-05-01 MED ORDER — FOSFOMYCIN TROMETHAMINE 3 G PO PACK
3.0000 g | PACK | Freq: Once | ORAL | Status: AC
  Administered 2013-05-01: 3 g via ORAL
  Filled 2013-05-01 (×2): qty 3

## 2013-05-01 MED ORDER — TAMSULOSIN HCL 0.4 MG PO CAPS: 0.4000 mg | ORAL_CAPSULE | Freq: Every day | ORAL | Status: DC

## 2013-05-01 MED ORDER — FOSFOMYCIN TROMETHAMINE 3 G PO PACK: 3.0000 g | PACK | ORAL | Status: DC

## 2013-05-01 MED ORDER — TRAMADOL-ACETAMINOPHEN 37.5-325 MG PO TABS: 1.0000 | ORAL_TABLET | Freq: Four times a day (QID) | ORAL | Status: DC | PRN

## 2013-05-01 NOTE — Care Management Note (Signed)
   CARE MANAGEMENT NOTE 05/01/2013  Patient:  [name], [name]   Account Number:  [id number]  Date Initiated:  04/28/2013  Documentation initiated by:  Darlyne Russian  Subjective/Objective Assessment:   Patient has hx of COPD, recurrent UTI, recent treatment bilateral LE cellulits, temp 104, BUN 48, CR 3.03     Action/Plan:   Progression of care and discharge planning   Anticipated DC Date:  05/01/2013   Anticipated DC Plan:  HOME W HOME HEALTH SERVICES         [facility] Choice  HOME HEALTH   Choice offered to / List presented to:  C-4 Adult Children        HH arranged  HH-1 RN  HH-2 PT  HH-3 OT      [facility] agency  [facility]   Status of service:  Completed, signed off Medicare Important Message given?   (If response is "NO", the following Medicare IM given date fields will be blank) Date Medicare IM given:   Date Additional Medicare IM given:    Discharge Disposition:  HOME W HOME HEALTH SERVICES  Per UR Regulation:  Reviewed for med. necessity/level of care/duration of stay  If discussed at Long Length of Stay Meetings, dates discussed:    Comments:

## 2013-05-01 NOTE — Progress Notes (Signed)
PT Cancellation Note  Patient Details Name: [name] MRN: [id number] DOB: 1928/02/06   Cancelled Treatment:    Reason Eval/Treat Not Completed: Fatigue/lethargy limiting ability to participate.  Pt adamantly refusing any mobility at this time.  Will check back another time.     Sunny Schlein, [place] [phone] 05/01/2013, 10:01 AM

## 2013-05-01 NOTE — Progress Notes (Signed)
Pt had order to go home with [facility] and PT today. However, now family is saying they probably can't care for him at home and want him to stay until tomorrow and explore SNF as an option. Discharge cancelled. Resume orders. Will report to am attending.  Jimmye Norman, NP [facility]

## 2013-05-01 NOTE — Care Management Note (Signed)
Met with pt re HH needs, he has given this CM permission to contact his daughter, with whom he lives to plan for d/c needs.  This CM placed calls to both daughters and await return calls.  [name] RN MPH Case Manager [phone]

## 2013-05-01 NOTE — Discharge Summary (Addendum)
Physician Discharge Summary  [name] ZOX:[id number] DOB: 26-Feb-1928 DOA: 04/27/2013  PCP: [name], MD  Admit date: 04/27/2013 Discharge date: 05/01/2013  Time spent: 40 minutes  Recommendations for Outpatient Follow-up:  1. Home with home health RN and PT 2. Followup with PCP in one week 3. Has followup appointment with his urologist on 8/8  Discharge Diagnoses:  Principal Problem:   UTI (lower urinary tract infection)  Active Problems:   ARF (acute renal failure)   Hypertension   Bilateral cellulitis of lower leg   COPD (chronic obstructive pulmonary disease)   CKD (chronic kidney disease) stage 3, GFR 30-59 ml/min   Obstructive uropathy   Discharge Condition: Fair  Diet recommendation: Low sodium  Filed Weights   04/28/13 2157 04/29/13 2206 04/30/13 2111  Weight: 76.2 kg (167 lb 15.9 oz) 74.9 kg (165 lb 2 oz) 74.7 kg (164 lb 10.9 oz)    History of present illness:  Please refer to admission H&P for details but in brief,79 year old male with a history of COPD and recurrent UTIs presented with not feeling well for the last few days and pain over his lower extremities. Patient noted to be febrile with temperature of 101F.  patient found to have acute on chronic kidney EGD with chest x-ray showing vascular congestion. He also found to have persistent cellulitis in his lower extremity and UTI.  Renal ultrasound showed bilateral hydronephrosis. R>L   Hospital Course:  UTI  Likely the source of his infection and a fever. Patient initially started on Levaquin and switch to on empiric imipenem and vancomycin. Urine culture showing enterococcus resistant to ampicillin and Levaquin..  Clinically improving and remains afebrile. He will be discharged on fosfomycin 3 g every 4 8 hrs for 3 dose . (will receive first dose prior to discharge)  Acute on chronic kidney injury  Likely in the setting of bilateral hydronephrosis with obstructive uropathy. Foley placed with good  urine output. Repeat ultrasound shows persistent right-sided hydronephrosis patient started on Flomax.  seen by urology consult Dr. Wynelle Link who recommended to continue Foley and followup with his urologist . Patient follows with Dr. McDiarmid as outpatient.  Creatinine slowly improving and is 1.8 today, baseline of around 1.6-2.0 .  Cellulitis RLE   patient placed on IV vancomycin given feel outpatient therapy. We'll discharge him on 5 more days of oral doxycycline to complete a 10 day course .  Elevated proBNP  Chest x-ray shows vascular congestion.  discontinued  fluids.  2 D echo showed normal EF.  COPD   continue home medications  Left knee pain Has history of knee placement. X-ray unremarkable. Reports pain over the leg as well and likely in the setting of neuropathy. Continue tramadol and Neurontin.  Code Status: DNR  Family Communication:  discussed with daughter [name] over the phone  Disposition Plan: home with HHPT  RN  Consultants:   Dr. Wynelle Link ( urology)  Procedures:  None Antibiotics:  IV Levaquin (7/31-8/2) IV VANCOMYCIN (8/1-8/4)        Discharge Exam: Filed Vitals:   04/30/13 2111 05/01/13 0500 05/01/13 0913 05/01/13 1127  BP: 128/46 142/79 112/53 128/49  Pulse: 107 59 114 63  Temp: 98.4 F (36.9 C) 98.4 F (36.9 C) 98.9 F (37.2 C) 97.7 F (36.5 C)  TempSrc: Oral Oral    Resp: 18 18 18 18   Height:      Weight: 74.7 kg (164 lb 10.9 oz)     SpO2: 98% 92% 98% 95%    General:  elderly male lying in bed appears fatigued HEENT: No pallor, moist oral mucosa Chest: Clear to auscultation bilaterally, no added sounds CVS: Normal S1 and S2, no murmurs rub or gallop Abdomen: Soft, nontender, nondistended, bowel sounds present, Foley in place Extremities: Warm, cellulitis over the leg is resolving, mild swelling over left knee CNS: AAO x3   Discharge Instructions     Medication List    STOP taking these medications       cefUROXime 500 MG tablet   Commonly known as:  CEFTIN     trimethoprim 100 MG tablet  Commonly known as:  TRIMPEX      TAKE these medications       albuterol (2.5 MG/3ML) 0.083% nebulizer solution  Commonly known as:  PROVENTIL  Take 2.5 mg by nebulization every 4 (four) hours as needed. For shortness of breath     alendronate 70 MG tablet  Commonly known as:  FOSAMAX  Take 70 mg by mouth every 7 (seven) days. Take with a full glass of water on an empty stomach.  On Friday     ALIGN 4 MG Caps  Take 1 capsule by mouth every morning.     aspirin EC 81 MG tablet  Take 81 mg by mouth every morning.     escitalopram 10 MG tablet  Commonly known as:  LEXAPRO  Take 10 mg by mouth daily.     eucerin cream  Apply 1 application topically as needed for dry skin.     flunisolide 29 MCG/ACT (0.025%) nasal spray  Commonly known as:  NASAREL  Place 2 sprays into the nose 2 (two) times daily. Dose is for each nostril.     fosfomycin 3 G Pack  Commonly known as:  MONUROL  Take 3 g by mouth every other day.  Start taking on:  05/03/2013 until 05/05/2013     furosemide 20 MG tablet  Commonly known as:  LASIX  Take 20 mg by mouth 2 (two) times daily. One tablet in the morning and half tablet in the evening.     gabapentin 100 MG capsule  Commonly known as:  NEURONTIN  Take 300 mg by mouth 2 (two) times daily.     leflunomide 20 MG tablet  Commonly known as:  ARAVA  Take 20 mg by mouth daily.     metoprolol tartrate 25 MG tablet  Commonly known as:  LOPRESSOR  Take 25 mg by mouth 2 (two) times daily.     omeprazole 20 MG capsule  Commonly known as:  PRILOSEC  Take 20 mg by mouth daily.     predniSONE 5 MG tablet  Commonly known as:  DELTASONE  Take 5 mg by mouth daily.     QUEtiapine 25 MG tablet  Commonly known as:  SEROQUEL  Take 25 mg by mouth at bedtime.     SUPER B COMPLEX/C Caps  Take 1 capsule by mouth daily.     tamsulosin 0.4 MG Caps capsule  Commonly known as:  FLOMAX  Take 1 capsule  (0.4 mg total) by mouth daily after supper.        Doxycycline tablets 100 mg take 1 tablet bid for 5 days         traMADol-acetaminophen 37.5-325 MG per tablet  Commonly known as:  ULTRACET  Take 1 tablet by mouth every 6 (six) hours as needed.     Vitamin D 2000 UNITS tablet  Take 2,000 Units by mouth every morning.  Allergies  Allergen Reactions  . Codeine Other (See Comments)     drives me crazy  . Hydrocodone Other (See Comments)    Makes patient hallucinate, cannot sleep  . Oxycodone Other (See Comments)    Makes patient hallucinate, cannot sleep  . Penicillins Hives  . Sulfa Antibiotics Other (See Comments)    Per MAR       Follow-up Information   Follow up with [name], MD In 1 week. (please check BMET)    Contact information:   [address] [place], [address] [place] [address] [phone]       Follow up with MCDIARMID,TODD D, MD On 05/05/2013.   Contact information:   [address] [place] [address] [phone]        The results of significant diagnostics from this hospitalization (including imaging, microbiology, ancillary and laboratory) are listed below for reference.    Significant Diagnostic Studies: Ct Abdomen Pelvis Wo Contrast  04/29/2013   *RADIOLOGY REPORT*  Clinical Data: Abdominal pain  CT ABDOMEN AND PELVIS WITHOUT CONTRAST  Technique:  Multidetector CT imaging of the abdomen and pelvis was performed following the standard protocol without intravenous contrast.  Comparison: 04/28/2013, 06/07/2012  Findings: The lung bases demonstrate multiple subpleural nodules and mild atelectasis in the right lung base. These changes are stable from prior exam  The liver, spleen, adrenal glands and pancreas are within normal limits.  The gallbladder is peripherally calcified and contracted. These changes are stable from prior examination.  Bilateral hydronephrosis is noted.  This is stable from the ultrasound from previous day.   The ureters are dilated to the level of the urinary bladder which is decompressed by Foley catheter. This raises suspicion for bladder abnormality as the etiology of the hydronephrosis.  An abdominal wall hernia is identified containing some bowel loops without incarceration.  These changes are stable from prior exam. The appendix is not well visualized and likely surgically removed. Diverticular change without diverticulitis is noted.  No pelvic mass lesion is seen.  IMPRESSION: Persistent bilateral hydronephrosis and hydroureter as described. These changes persist despite decompression by Foley catheter of the bladder.  There may be distal ureteral stenosis.  Changes in lung bases bilaterally stable from prior exam.  CT of the chest for further characterization may be helpful   Original Report Authenticated By: Alcide Clever, M.D.   Dg Chest 1 View  04/27/2013   *RADIOLOGY REPORT*  Clinical Data: Shortness of breath, weakness.  Lower extremity and back pain.  Fever.  CHEST - 1 VIEW  Comparison: 02/27/2013  Findings: Elevation of the right hemidiaphragm, stable.  Right base atelectasis or scarring, stable.  Heart is mildly enlarged.  Mild diffuse interstitial prominence of the lungs.  Mild vascular congestion.  No visible effusions or acute bony abnormality.  IMPRESSION: Cardiomegaly, vascular congestion.  Cannot exclude early interstitial edema.  Stable elevation of the right hemidiaphragm with right base atelectasis or scarring.   Original Report Authenticated By: Charlett Nose, M.D.   US Renal  04/28/2013   *RADIOLOGY REPORT*  Clinical Data:  Follow up obstruction  RENAL/URINARY TRACT ULTRASOUND COMPLETE  Comparison:  04/28/2013  Findings:  Right Kidney:  Measures 11.9 cm.  There is increased cortical echogenicity. Persistent moderate right-sided hydronephrosis. This does not appear significantly improved from previous exam.  Left Kidney:  Measures 11.2 cm in length.  Increased cortical echogenicity. No change  in left hydronephrosis.  Bladder:  Collapsed around a Foley catheter.  IMPRESSION:  1.  No significant change  in bilateral hydronephrosis.   Original Report Authenticated By: Signa Kell, M.D.   US Renal  04/28/2013   *RADIOLOGY REPORT*  Clinical Data: Recurrent urinary tract infections and renal failure.  RENAL/URINARY TRACT ULTRASOUND COMPLETE  Comparison:  06/10/2012  Findings:  Right Kidney:  12 cm in length.  Moderately severe hydronephrosis present.  The cortex is echogenic.  Left Kidney:  10.2 cm in length.  Moderately severe hydronephrosis present.  The cortex is echogenic.  Bladder:  The bladder is irregular in appearance with irregular wall thickening and potential internal areas of septation. Underlying tumor or debris in the bladder cannot be excluded by ultrasound.  IMPRESSION: Significant bilateral hydronephrosis present.  Hydronephrosis was not present on the prior renal ultrasound.  Level of obstruction may be at the bladder where irregular wall thickening is identified.  Findings were communicated by telephone to Maren Reamer with [facility].   Original Report Authenticated By: Irish Lack, M.D.   Dg Knee Left Port  05/01/2013   *RADIOLOGY REPORT*  Clinical Data: Pain, swelling.  PORTABLE LEFT KNEE - 1-2 VIEW  Comparison: None.  Findings: Prior left knee replacement.  No hardware bony complicating feature. No acute bony abnormality.  Specifically, no fracture, subluxation, or dislocation.  Soft tissues are intact. No joint effusion.  IMPRESSION: Left knee replacement.  No complicating feature. No acute findings.   Original Report Authenticated By: Charlett Nose, M.D.    Microbiology: Recent Results (from the past 240 hour(s))  URINE CULTURE     Status: None   Collection Time    04/27/13  4:02 PM      Result Value Range Status   Specimen Description URINE, CATHETERIZED   Final   Special Requests NONE   Final   Culture  Setup Time 04/27/2013 17:21   Final   Colony Count  >=100,000 COLONIES/ML   Final   Culture ENTEROCOCCUS SPECIES   Final   Report Status 04/29/2013 FINAL   Final   Organism ID, Bacteria ENTEROCOCCUS SPECIES   Final  CULTURE, BLOOD (ROUTINE X 2)     Status: None   Collection Time    04/27/13  4:24 PM      Result Value Range Status   Specimen Description BLOOD ARM RIGHT   Final   Special Requests BOTTLES DRAWN AEROBIC ONLY 9CC   Final   Culture  Setup Time 04/28/2013 01:20   Final   Culture     Final   Value:        BLOOD CULTURE RECEIVED NO GROWTH TO DATE CULTURE WILL BE HELD FOR 5 DAYS BEFORE ISSUING A FINAL NEGATIVE REPORT   Report Status PENDING   Incomplete  CULTURE, BLOOD (ROUTINE X 2)     Status: None   Collection Time    04/27/13  4:30 PM      Result Value Range Status   Specimen Description BLOOD HAND RIGHT   Final   Special Requests BOTTLES DRAWN AEROBIC ONLY 3CC   Final   Culture  Setup Time 04/28/2013 01:20   Final   Culture     Final   Value: STAPHYLOCOCCUS SPECIES (COAGULASE NEGATIVE)     Note: THE SIGNIFICANCE OF ISOLATING THIS ORGANISM FROM A SINGLE SET OF BLOOD CULTURES WHEN MULTIPLE SETS ARE DRAWN IS UNCERTAIN. PLEASE NOTIFY THE MICROBIOLOGY DEPARTMENT WITHIN ONE WEEK IF SPECIATION AND SENSITIVITIES ARE REQUIRED.     Note: Gram Stain Report Called to,Read Back By and Verified With:  Ranelle Oyster [id number]   Report  Status 04/30/2013 FINAL   Final     Labs: Basic Metabolic Panel:  Recent Labs Lab 04/27/13 1455 04/28/13 0450 04/29/13 0515 04/30/13 0940 05/01/13 0435  NA 143 145 145 142 143  K 4.4 4.3 3.5 3.8 3.7  CL 106 110 110 108 109  CO2 21 21 21 26 25   GLUCOSE 88 73 93 103* 87  BUN 48* 46* 36* 24* 21  CREATININE 3.03* 2.83* 2.43* 1.98* 1.82*  CALCIUM 8.7 8.3* 8.3* 8.4 8.5   Liver Function Tests:  Recent Labs Lab 04/27/13 1455 04/30/13 0940  AST 19 20  ALT 14 14  ALKPHOS 109 139*  BILITOT 0.7 0.4  PROT 6.3 5.5*  ALBUMIN 2.8* 2.1*   No results found for this basename: LIPASE,  AMYLASE,  in the last 168 hours No results found for this basename: AMMONIA,  in the last 168 hours CBC:  Recent Labs Lab 04/27/13 1455 04/28/13 0450  WBC 9.5 8.0  HGB 9.5* 8.5*  HCT 29.3* 26.1*  MCV 94.2 94.9  PLT 171 164   Cardiac Enzymes:  Recent Labs Lab 04/27/13 1455  TROPONINI <0.30   BNP: BNP (last 3 results)  Recent Labs  06/05/12 0524 01/03/13 0552 04/27/13 1455  PROBNP 506.6* 602.9* 3460.0*   CBG:  Recent Labs Lab 04/28/13 1154  GLUCAP 93       Signed:  Galilee Pierron  [facility] 05/01/2013, 1:28 PM

## 2013-05-02 ENCOUNTER — Inpatient Hospital Stay (HOSPITAL_COMMUNITY): Payer: Medicare Other

## 2013-05-02 MED ORDER — CALAMINE EX LOTN
TOPICAL_LOTION | Freq: Two times a day (BID) | CUTANEOUS | Status: DC
  Administered 2013-05-02 – 2013-05-04 (×4): via TOPICAL
  Filled 2013-05-02 (×2): qty 177

## 2013-05-02 MED ORDER — DIPHENHYDRAMINE HCL 50 MG/ML IJ SOLN
12.5000 mg | Freq: Four times a day (QID) | INTRAMUSCULAR | Status: DC | PRN
  Administered 2013-05-02 – 2013-05-04 (×3): 12.5 mg via INTRAVENOUS
  Filled 2013-05-02 (×3): qty 1

## 2013-05-02 MED ORDER — CALAMINE EX LOTN
TOPICAL_LOTION | Freq: Two times a day (BID) | CUTANEOUS | Status: DC
  Filled 2013-05-02: qty 118

## 2013-05-02 MED ORDER — FOSFOMYCIN TROMETHAMINE 3 G PO PACK
3.0000 g | PACK | ORAL | Status: AC
  Administered 2013-05-03 – 2013-05-05 (×2): 3 g via ORAL
  Filled 2013-05-02 (×2): qty 3

## 2013-05-02 MED ORDER — ALBUTEROL SULFATE HFA 108 (90 BASE) MCG/ACT IN AERS
2.0000 | INHALATION_SPRAY | Freq: Four times a day (QID) | RESPIRATORY_TRACT | Status: DC
  Filled 2013-05-02: qty 6.7

## 2013-05-02 NOTE — Clinical Social Work Psychosocial (Signed)
Clinical Social Work Department BRIEF PSYCHOSOCIAL ASSESSMENT 05/02/2013  Patient:  [name], [name]     Account Number:  [id number]     Admit date:  04/27/2013  Clinical Social Worker:  Delmer Islam  Date/Time:  05/02/2013 03:17 AM  Referred by:  Physician  Date Referred:  05/02/2013 Referred for  SNF Placement   Other Referral:   Interview type:  Family Other interview type:   CSW talked with patient's daughter [name] at [phone]. [name] home number is [phone] and her mobile is [phone].    PSYCHOSOCIAL DATA Living Status:  WITH ADULT CHILDREN Admitted from facility:   Level of care:   Primary support name:  [name] Primary support relationship to patient:  CHILD, ADULT Degree of support available:   Daughter very involved in patient's care    CURRENT CONCERNS Current Concerns  Post-Acute Placement   Other Concerns:    SOCIAL WORK ASSESSMENT / PLAN CSW talked with daughter by phone regarding discharge planning. Daughter feels that going back to a SNF for rehab may be appropriate, however she does not want him to return to [facility] where he was recently discharged from, as she feels he did not receive appropriate care there.    CSW inquired into how long patient was at skilled facility and daughter reported that he was there 100 days as she had to appeal him being discharged and he was able to days a few days longer. CSW advised daughter that due to recent d/c from [facility] (a week ago this past Wednesday per daughter) he has not had a wellness period and would have to pay privately. [name] stated that all patient has is his income from [name] and he would not be able to pay privatley. Daughter reported that her father has a long-term care policy through [name] and she plans to talk with insurance representative to determine if patient can get care in the home. Daughter also advised that she has been in contact with the [name] to determine if patient can get  any in-home help through them.   Assessment/plan status:  Psychosocial Support/Ongoing Assessment of Needs Other assessment/ plan:   Information/referral to community resources:    PATIENT'S/FAMILY'S RESPONSE TO PLAN OF CARE: Daughter appreciative of CSW contact. She indicated that patient cannot afford to pay privately at a skilled facility, but understands that patient needs assistance at home. Daughter's plan is to check out the options she has to determine if patient can get help at home.

## 2013-05-02 NOTE — Progress Notes (Signed)
Physical Therapy Treatment Patient Details Name: [name] MRN: [id number] DOB: May 02, 1928 Today's Date: 05/02/2013 Time: 7829-5621 PT Time Calculation (min): 40 min  PT Assessment / Plan / Recommendation  History of Present Illness 79 y.o. male with PMHx of COPD, recurrent UTIs and recent treatment for cellulitis who had called the EMS reportedly for shortness of breath. However it is of note the patient denies ever having shortness of breath. His main complaint was of not feeling well overall with some pain in his lower extremities   PT Comments   Pt agreeable to PT, however needed to have loose BM on 3-in-1.  Unable to ambulate 2/2 pt very fatigued and becomes SOB with minimal activity.  O2 sats ranged 91-96% during mobility.  Noted that family feels unable to take pt home and provide care for him at this time.  Pt will need SNF for safe D/C.    Follow Up Recommendations  SNF     Does the patient have the potential to tolerate intense rehabilitation     Barriers to Discharge        Equipment Recommendations  None recommended by PT    Recommendations for Other Services    Frequency Min 3X/week   Progress towards PT Goals Progress towards PT goals: Progressing toward goals  Plan Current plan remains appropriate    Precautions / Restrictions Precautions Precautions: Fall Restrictions Weight Bearing Restrictions: No   Pertinent Vitals/Pain Denies pain.      Mobility  Bed Mobility Bed Mobility: Supine to Sit;Sitting - Scoot to Edge of Bed Supine to Sit: 5: Supervision;HOB elevated Sitting - Scoot to Edge of Bed: 5: Supervision Details for Bed Mobility Assistance: pt able to complete without A with increased time.   Transfers Transfers: Sit to Stand;Stand to [employer] Transfers Sit to Stand: 4: Min assist;With upper extremity assist;From bed;From chair/3-in-1 Stand to Sit: 4: Min assist;With upper extremity assist;To chair/3-in-1;To bed;With armrests Stand Pivot  Transfers: 4: Min assist Details for Transfer Assistance: cues for use of UEs, pursed lip breathing, encouragement, and to get closer prior to sitting.   Ambulation/Gait Ambulation/Gait Assistance: Not tested (comment) Stairs: No Wheelchair Mobility Wheelchair Mobility: No    Exercises     PT Diagnosis:    PT Problem List:   PT Treatment Interventions:     PT Goals (current goals can now be found in the care plan section) Acute Rehab PT Goals Time For Goal Achievement: 05/12/13 Potential to Achieve Goals: Good  Visit Information  Last PT Received On: 05/02/13 Assistance Needed: +1 History of Present Illness: 79 y.o. male with PMHx of COPD, recurrent UTIs and recent treatment for cellulitis who had called the EMS reportedly for shortness of breath. However it is of note the patient denies ever having shortness of breath. His main complaint was of not feeling well overall with some pain in his lower extremities    Subjective Data  Subjective: "I guess."     Cognition  Cognition Arousal/Alertness: Awake/alert Behavior During Therapy: WFL for tasks assessed/performed Overall Cognitive Status: No family/caregiver present to determine baseline cognitive functioning Memory: Decreased short-term memory    Balance  Balance Balance Assessed: Yes Static Standing Balance Static Standing - Balance Support: Bilateral upper extremity supported;During functional activity Static Standing - Level of Assistance: 5: Stand by assistance Static Standing - Comment/# of Minutes: pt able to stand for Peri hygiene with S.  Needed total A for peri hygiene.    End of Session PT - End of  Session Equipment Utilized During Treatment: Gait belt Activity Tolerance: Patient limited by fatigue Patient left: in chair;with call bell/phone within reach;with chair alarm set Nurse Communication: Mobility status   GP     Sunny Schlein, [place] [phone] 05/02/2013, 2:11 PM

## 2013-05-02 NOTE — Progress Notes (Addendum)
[facility] PROGRESS NOTE  [name] RUE:[id number] DOB: [birth year] DOA: 04/27/2013 PCP: [name], MD   Brief narrative: 79 year old male with a history of COPD and recurrent UTIs presented with not feeling well for the last few days and pain over his lower extremities. Patient noted to be febrile with temperature of 101F. patient found to have acute on chronic kidney EGD with chest x-ray showing vascular congestion. He also found to have persistent cellulitis in his lower extremity and UTI.  Renal ultrasound showed bilateral hydronephrosis. R>L  Assessment/Plan: UTI  Likely the source of his infection and a fever. Patient initially started on Levaquin and switch to on empiric imipenem and vancomycin. Urine culture showing enterococcus resistant to ampicillin and Levaquin..  Clinically improving and remains afebrile. Switch to fosfomycin 3 g every 4 8 hrs for 3 dose .( completes on 8/8)  Acute on chronic kidney injury  Likely in the setting of bilateral hydronephrosis with obstructive uropathy. Foley placed with good urine output. Repeat ultrasound shows persistent right-sided hydronephrosis patient started on Flomax. seen by urology consult Dr. Wynelle Link who recommended to continue Foley and followup with his urologist .  Patient follows with Dr. McDiarmid as outpatient.  Creatinine slowly improving and is 1.8 , baseline of around 1.6-2.0 .   Cellulitis RLE  patient placed on IV vancomycin given feel outpatient therapy. switch to oral doxycycline to complete a 10 day course .   Elevated proBNP  Chest x-ray showed vascular congestion. discontinued fluids. 2 D echo showed normal EF. Patient desaturated to high 80s this am. CXR showing peripheral opacity , cannot r/o atelectasis, PNA vs nodularity. Has subpleural nodules on previous imaging. No signs of infection. likely atelectasis. Will provide bedside spirometry and albuterol nebs   COPD  continue home medications   Left  knee pain  Has history of knee placement. X-ray unremarkable. Reports pain over the leg as well and likely in the setting of neuropathy. Continue tramadol and Neurontin.   Code Status: DNR  Family Communication: discussed with daughter on 8/4 Disposition Plan: planned for d/c home with services. patient quite deconditioned and daughter hesitant tot ake him home . Plan on d/c to SNF   Consultants:  Dr. Wynelle Link ( urology)    Procedures:  None Antibiotics:  IV Levaquin (7/31-8/2)  IV VANCOMYCIN (8/1-8/4) PO FOSFOMYCIN 8/4-8/8     HPI/Subjective: Reports feeling weak. Denies any SOB  Objective: Filed Vitals:   05/02/13 1345  BP: 124/69  Pulse: 85  Temp: 98.1 F (36.7 C)  Resp: 18    Intake/Output Summary (Last 24 hours) at 05/02/13 1459 Last data filed at 05/02/13 1340  Gross per 24 hour  Intake    120 ml  Output    300 ml  Net   -180 ml   Filed Weights   04/29/13 2206 04/30/13 2111 05/01/13 2208  Weight: 74.9 kg (165 lb 2 oz) 74.7 kg (164 lb 10.9 oz) 73.4 kg (161 lb 13.1 oz)    Exam:  General: elderly male lying in bed appears fatigued and frail HEENT: No pallor, moist oral mucosa  Chest: bilateral scattered rhonchi   no added sounds  CVS: Normal S1 and S2, no murmurs rub or gallop  Abdomen: Soft, nontender, nondistended, bowel sounds present, Foley in place  Extremities: Warm, cellulitis over the leg is resolving, mild swelling over left knee  CNS: AAO x3   Data Reviewed: Basic Metabolic Panel:  Recent Labs Lab 04/27/13 1455 04/28/13 0450 04/29/13 0515 04/30/13 0940 05/01/13 0435  NA 143 145 145 142 143  K 4.4 4.3 3.5 3.8 3.7  CL 106 110 110 108 109  CO2 21 21 21 26 25   GLUCOSE 88 73 93 103* 87  BUN 48* 46* 36* 24* 21  CREATININE 3.03* 2.83* 2.43* 1.98* 1.82*  CALCIUM 8.7 8.3* 8.3* 8.4 8.5   Liver Function Tests:  Recent Labs Lab 04/27/13 1455 04/30/13 0940  AST 19 20  ALT 14 14  ALKPHOS 109 139*  BILITOT 0.7 0.4  PROT 6.3 5.5*   ALBUMIN 2.8* 2.1*   No results found for this basename: LIPASE, AMYLASE,  in the last 168 hours No results found for this basename: AMMONIA,  in the last 168 hours CBC:  Recent Labs Lab 04/27/13 1455 04/28/13 0450  WBC 9.5 8.0  HGB 9.5* 8.5*  HCT 29.3* 26.1*  MCV 94.2 94.9  PLT 171 164   Cardiac Enzymes:  Recent Labs Lab 04/27/13 1455  TROPONINI <0.30   BNP (last 3 results)  Recent Labs  06/05/12 0524 01/03/13 0552 04/27/13 1455  PROBNP 506.6* 602.9* 3460.0*   CBG:  Recent Labs Lab 04/28/13 1154  GLUCAP 93    Recent Results (from the past 240 hour(s))  URINE CULTURE     Status: None   Collection Time    04/27/13  4:02 PM      Result Value Range Status   Specimen Description URINE, CATHETERIZED   Final   Special Requests NONE   Final   Culture  Setup Time 04/27/2013 17:21   Final   Colony Count >=100,000 COLONIES/ML   Final   Culture ENTEROCOCCUS SPECIES   Final   Report Status 04/29/2013 FINAL   Final   Organism ID, Bacteria ENTEROCOCCUS SPECIES   Final  CULTURE, BLOOD (ROUTINE X 2)     Status: None   Collection Time    04/27/13  4:24 PM      Result Value Range Status   Specimen Description BLOOD ARM RIGHT   Final   Special Requests BOTTLES DRAWN AEROBIC ONLY 9CC   Final   Culture  Setup Time 04/28/2013 01:20   Final   Culture     Final   Value:        BLOOD CULTURE RECEIVED NO GROWTH TO DATE CULTURE WILL BE HELD FOR 5 DAYS BEFORE ISSUING A FINAL NEGATIVE REPORT   Report Status PENDING   Incomplete  CULTURE, BLOOD (ROUTINE X 2)     Status: None   Collection Time    04/27/13  4:30 PM      Result Value Range Status   Specimen Description BLOOD HAND RIGHT   Final   Special Requests BOTTLES DRAWN AEROBIC ONLY 3CC   Final   Culture  Setup Time 04/28/2013 01:20   Final   Culture     Final   Value: STAPHYLOCOCCUS SPECIES (COAGULASE NEGATIVE)     Note: THE SIGNIFICANCE OF ISOLATING THIS ORGANISM FROM A SINGLE SET OF BLOOD CULTURES WHEN MULTIPLE SETS  ARE DRAWN IS UNCERTAIN. PLEASE NOTIFY THE MICROBIOLOGY DEPARTMENT WITHIN ONE WEEK IF SPECIATION AND SENSITIVITIES ARE REQUIRED.     Note: Gram Stain Report Called to,Read Back By and Verified With:  Ranelle Oyster [id number] BRMEL   Report Status 04/30/2013 FINAL   Final     Studies: Dg Chest 2 View  05/02/2013   *RADIOLOGY REPORT*  Clinical Data: Shortness of breath  CHEST - 2 VIEW  Comparison: 04/29/2013  Findings: Enlargement of cardiac silhouette post median sternotomy. Tortuous  thoracic aorta. Slight pulmonary vascular congestion. Chronic elevation of right diaphragm. Bibasilar atelectasis greater on the right. Peripheral opacity at the mid lateral lower left chest could be related to infiltrate, atelectasis or underlying nodularity. Remaining lungs clear. No gross pleural effusion or pneumothorax. Bones appear diffusely demineralized.  IMPRESSION: Enlargement of cardiac silhouette. Bibasilar atelectasis. Peripheral opacity at the lower lateral right chest of uncertain etiology, question infiltrate, atelectasis or nodularity; consider follow-up CT chest for further evaluation.   Original Report Authenticated By: Ulyses Southward, M.D.   Dg Knee Left Port  05/01/2013   *RADIOLOGY REPORT*  Clinical Data: Pain, swelling.  PORTABLE LEFT KNEE - 1-2 VIEW  Comparison: None.  Findings: Prior left knee replacement.  No hardware bony complicating feature. No acute bony abnormality.  Specifically, no fracture, subluxation, or dislocation.  Soft tissues are intact. No joint effusion.  IMPRESSION: Left knee replacement.  No complicating feature. No acute findings.   Original Report Authenticated By: Charlett Nose, M.D.    Scheduled Meds: . acetaminophen  650 mg Oral Once  . calamine   Topical BID  . enoxaparin (LOVENOX) injection  30 mg Subcutaneous Q24H  . [START ON 05/03/2013] fosfomycin  3 g Oral Q48H  . hydrocerin   Topical BID  . tamsulosin  0.4 mg Oral QPC supper   Continuous Infusions:    Time spent:  25 MINUTES    [name]  [facility] Pager [phone] If 7PM-7AM, please contact night-coverage at [email or web address], password [facility] 05/02/2013, 2:59 PM  LOS: 5 days

## 2013-05-03 ENCOUNTER — Other Ambulatory Visit: Payer: Self-pay

## 2013-05-03 DIAGNOSIS — I498 Other specified cardiac arrhythmias: Secondary | ICD-10-CM

## 2013-05-03 DIAGNOSIS — R000 Tachycardia, unspecified: Secondary | ICD-10-CM | POA: Diagnosis not present

## 2013-05-03 LAB — BASIC METABOLIC PANEL
BUN: 21 mg/dL (ref 6–23)
Chloride: 106 mEq/L (ref 96–112)
Creatinine, Ser: 1.72 mg/dL — ABNORMAL HIGH (ref 0.50–1.35)
GFR calc Af Amer: 41 mL/min — ABNORMAL LOW (ref >90)
GFR calc non Af Amer: 35 mL/min — ABNORMAL LOW (ref >90)

## 2013-05-03 MED ORDER — METOPROLOL TARTRATE 1 MG/ML IV SOLN
5.0000 mg | Freq: Once | INTRAVENOUS | Status: AC
  Administered 2013-05-03: 5 mg via INTRAVENOUS
  Filled 2013-05-03: qty 5

## 2013-05-03 MED ORDER — METOPROLOL TARTRATE 25 MG PO TABS
25.0000 mg | ORAL_TABLET | Freq: Two times a day (BID) | ORAL | Status: DC
  Administered 2013-05-03 – 2013-05-04 (×3): 25 mg via ORAL
  Filled 2013-05-03 (×4): qty 1

## 2013-05-03 MED ORDER — LOPERAMIDE HCL 2 MG PO CAPS: 2.0000 mg | ORAL_CAPSULE | ORAL | Status: DC | PRN

## 2013-05-03 MED ORDER — DOXYCYCLINE HYCLATE 100 MG PO TABS
100.0000 mg | ORAL_TABLET | Freq: Two times a day (BID) | ORAL | Status: DC
  Administered 2013-05-03 – 2013-05-05 (×5): 100 mg via ORAL
  Filled 2013-05-03 (×6): qty 1

## 2013-05-03 NOTE — Progress Notes (Addendum)
[facility] PROGRESS NOTE  [name] BMW:[id number] DOB: 1928-03-14 DOA: 04/27/2013 PCP: [name], MD  Brief narrative:  79 year old male with a history of COPD and recurrent UTIs presented with not feeling well for the last few days and pain over his lower extremities. Patient noted to be febrile with temperature of 101F. patient found to have acute on chronic kidney EGD with chest x-ray showing vascular congestion. He also found to have persistent cellulitis in his lower extremity and UTI.  Renal ultrasound showed bilateral hydronephrosis. R>L   Assessment/Plan:  UTI  Likely the source of his infection and a fever. Patient initially started on Levaquin and switch to on empiric imipenem and vancomycin. Urine culture showing enterococcus resistant to ampicillin and Levaquin..  Clinically improving and remains afebrile. Switch to fosfomycin 3 g every 4 8 hrs for 3 dose .( completes on 8/8)   Acute on chronic kidney injury  Likely in the setting of bilateral hydronephrosis with obstructive uropathy. Foley placed with good urine output. Repeat ultrasound shows persistent right-sided hydronephrosis patient started on Flomax. seen by urology consult Dr. Wynelle Link who recommended to continue Foley and followup with his urologist .  Patient follows with Dr. McDiarmid as outpatient.  Creatinine slowly improving and is 1.7 , baseline of around 1.6-2.0 .   Cellulitis RLE  patient placed on IV vancomycin given feel outpatient therapy. switch to oral doxycycline to complete a 10 day course . ( completes on 8/10)  Elevated proBNP  Chest x-ray showed vascular congestion. discontinued fluids. 2 D echo showed normal EF. Patient desaturated to high 80s this am. CXR showing peripheral opacity , cannot r/o atelectasis, PNA vs nodularity. Has subpleural nodules on previous imaging. No signs of infection. likely atelectasis. Will provide bedside spirometry and albuterol nebs   COPD  continue home  medications   Left knee pain  Has history of knee placement. X-ray unremarkable. Reports pain over the leg as well and likely in the setting of neuropathy. Continue tramadol and Neurontin.   Tachycardia Likely in the setting of nebs use and patient on metoprolol which was held since admission. Resume metoprolol. Check orthostasis and monitor. EKG shows sinus tachycardia with PACs.  Code Status: DNR  Family Communication: discussed with daughter on 8/4  Disposition Plan: planned for d/c home with services. patient quite deconditioned and daughter hesitant to take him home . Plan on d/c to SNF but patient has used up all his NH days. Daughter deciding if she wants to take him home. discharge likely tomorrow if stable  Consultants:  Dr. Wynelle Link ( urology)    Procedures:  None   Antibiotics:  IV Levaquin (7/31-8/2)  IV VANCOMYCIN (8/1-8/4)  PO FOSFOMYCIN 8/4-8/8 Po doxycycline 8/6-8/10   HPI/Subjective:  Reports feeling weak. Denies any SOB. HR elevated to  140S-160s on trying to stand or ambulate   Objective: Filed Vitals:   05/03/13 1041  BP: 144/100  Pulse: 87  Temp:   Resp:     Intake/Output Summary (Last 24 hours) at 05/03/13 1305 Last data filed at 05/03/13 0900  Gross per 24 hour  Intake    600 ml  Output    800 ml  Net   -200 ml   Filed Weights   04/30/13 2111 05/01/13 2208 05/02/13 2100  Weight: 74.7 kg (164 lb 10.9 oz) 73.4 kg (161 lb 13.1 oz) 72.3 kg (159 lb 6.3 oz)    Exam:  General: elderly male lying in bed appears fatigued and frail  HEENT: No pallor,  moist oral mucosa  Chest: bilateral scattered rhonchi no added sounds  CVS:  S1 and S2 tachycardic , no murmurs rub or gallop  Abdomen: Soft, nontender, nondistended, bowel sounds present, Foley in place  Extremities: Warm, cellulitis over the leg is resolving, swelling over left knee also resolving   CNS: AAO x3   Data Reviewed: Basic Metabolic Panel:  Recent Labs Lab 04/28/13 0450  04/29/13 0515 04/30/13 0940 05/01/13 0435 05/03/13 0600  NA 145 145 142 143 142  K 4.3 3.5 3.8 3.7 3.7  CL 110 110 108 109 106  CO2 21 21 26 25 27   GLUCOSE 73 93 103* 87 90  BUN 46* 36* 24* 21 21  CREATININE 2.83* 2.43* 1.98* 1.82* 1.72*  CALCIUM 8.3* 8.3* 8.4 8.5 9.5   Liver Function Tests:  Recent Labs Lab 04/27/13 1455 04/30/13 0940  AST 19 20  ALT 14 14  ALKPHOS 109 139*  BILITOT 0.7 0.4  PROT 6.3 5.5*  ALBUMIN 2.8* 2.1*   No results found for this basename: LIPASE, AMYLASE,  in the last 168 hours No results found for this basename: AMMONIA,  in the last 168 hours CBC:  Recent Labs Lab 04/27/13 1455 04/28/13 0450  WBC 9.5 8.0  HGB 9.5* 8.5*  HCT 29.3* 26.1*  MCV 94.2 94.9  PLT 171 164   Cardiac Enzymes:  Recent Labs Lab 04/27/13 1455  TROPONINI <0.30   BNP (last 3 results)  Recent Labs  06/05/12 0524 01/03/13 0552 04/27/13 1455  PROBNP 506.6* 602.9* 3460.0*   CBG:  Recent Labs Lab 04/28/13 1154 05/02/13 2056  GLUCAP 93 89    Recent Results (from the past 240 hour(s))  URINE CULTURE     Status: None   Collection Time    04/27/13  4:02 PM      Result Value Range Status   Specimen Description URINE, CATHETERIZED   Final   Special Requests NONE   Final   Culture  Setup Time 04/27/2013 17:21   Final   Colony Count >=100,000 COLONIES/ML   Final   Culture ENTEROCOCCUS SPECIES   Final   Report Status 04/29/2013 FINAL   Final   Organism ID, Bacteria ENTEROCOCCUS SPECIES   Final  CULTURE, BLOOD (ROUTINE X 2)     Status: None   Collection Time    04/27/13  4:24 PM      Result Value Range Status   Specimen Description BLOOD ARM RIGHT   Final   Special Requests BOTTLES DRAWN AEROBIC ONLY 9CC   Final   Culture  Setup Time 04/28/2013 01:20   Final   Culture     Final   Value:        BLOOD CULTURE RECEIVED NO GROWTH TO DATE CULTURE WILL BE HELD FOR 5 DAYS BEFORE ISSUING A FINAL NEGATIVE REPORT   Report Status PENDING   Incomplete  CULTURE,  BLOOD (ROUTINE X 2)     Status: None   Collection Time    04/27/13  4:30 PM      Result Value Range Status   Specimen Description BLOOD HAND RIGHT   Final   Special Requests BOTTLES DRAWN AEROBIC ONLY 3CC   Final   Culture  Setup Time 04/28/2013 01:20   Final   Culture     Final   Value: STAPHYLOCOCCUS SPECIES (COAGULASE NEGATIVE)     Note: THE SIGNIFICANCE OF ISOLATING THIS ORGANISM FROM A SINGLE SET OF BLOOD CULTURES WHEN MULTIPLE SETS ARE DRAWN IS UNCERTAIN. PLEASE NOTIFY THE  MICROBIOLOGY DEPARTMENT WITHIN ONE WEEK IF SPECIATION AND SENSITIVITIES ARE REQUIRED.     Note: Gram Stain Report Called to,Read Back By and Verified With:  Ranelle Oyster [id number] BRMEL   Report Status 04/30/2013 FINAL   Final     Studies: Dg Chest 2 View  05/02/2013   *RADIOLOGY REPORT*  Clinical Data: Shortness of breath  CHEST - 2 VIEW  Comparison: 04/29/2013  Findings: Enlargement of cardiac silhouette post median sternotomy. Tortuous thoracic aorta. Slight pulmonary vascular congestion. Chronic elevation of right diaphragm. Bibasilar atelectasis greater on the right. Peripheral opacity at the mid lateral lower left chest could be related to infiltrate, atelectasis or underlying nodularity. Remaining lungs clear. No gross pleural effusion or pneumothorax. Bones appear diffusely demineralized.  IMPRESSION: Enlargement of cardiac silhouette. Bibasilar atelectasis. Peripheral opacity at the lower lateral right chest of uncertain etiology, question infiltrate, atelectasis or nodularity; consider follow-up CT chest for further evaluation.   Original Report Authenticated By: Ulyses Southward, M.D.    Scheduled Meds: . acetaminophen  650 mg Oral Once  . calamine   Topical BID  . enoxaparin (LOVENOX) injection  30 mg Subcutaneous Q24H  . fosfomycin  3 g Oral Q48H  . hydrocerin   Topical BID  . tamsulosin  0.4 mg Oral QPC supper   Continuous Infusions:     Time spent: 25 MINUTES    Rafaela Dinius  [facility] Pager [phone]. If 7PM-7AM, please contact night-coverage at [email or web address], password [facility] 05/03/2013, 1:05 PM  LOS: 6 days

## 2013-05-03 NOTE — Progress Notes (Signed)
HR elevated this am, sustaining 120's-130's. HR increased to 160's when up to [facility]. Pt asymptomatic, no chest pain, SOB. MD notified.

## 2013-05-04 LAB — CREATININE, SERUM: Creatinine, Ser: 1.67 mg/dL — ABNORMAL HIGH (ref 0.50–1.35)

## 2013-05-04 LAB — CULTURE, BLOOD (ROUTINE X 2): Culture: NO GROWTH

## 2013-05-04 LAB — CLOSTRIDIUM DIFFICILE BY PCR: Toxigenic C. Difficile by PCR: NEGATIVE

## 2013-05-04 MED ORDER — METOPROLOL TARTRATE 50 MG PO TABS
50.0000 mg | ORAL_TABLET | Freq: Two times a day (BID) | ORAL | Status: DC
  Administered 2013-05-04 – 2013-05-05 (×2): 50 mg via ORAL
  Filled 2013-05-04 (×3): qty 1

## 2013-05-04 MED ORDER — DIPHENOXYLATE-ATROPINE 2.5-0.025 MG PO TABS
2.0000 | ORAL_TABLET | Freq: Four times a day (QID) | ORAL | Status: DC | PRN
  Administered 2013-05-04 – 2013-05-05 (×2): 2 via ORAL
  Filled 2013-05-04 (×2): qty 2

## 2013-05-04 NOTE — Clinical Social Work Note (Signed)
CSW continuing to monitor patient's progress. Patient is currently being evaluated by [facility] and nurse case manager will be informed if they can take patient. CSW signing off concerning SNF consult as patient has used all of his Medicare skilled facility days and has not had a wellness period.  Genelle Bal, MSW, LCSW [phone]

## 2013-05-04 NOTE — Progress Notes (Signed)
[facility] PROGRESS NOTE  [name] ZOX:[id number] DOB: [birth year] DOA: 04/27/2013 PCP: [name], MD  Brief narrative:  79 year old male with a history of COPD and recurrent UTIs presented with not feeling well for the last few days and pain over his lower extremities. Patient noted to be febrile with temperature of 101F. patient found to have acute on chronic kidney EGD with chest x-ray showing vascular congestion. He also found to have persistent cellulitis in his lower extremity and UTI.  Renal ultrasound showed bilateral hydronephrosis. R>L    Assessment/Plan:  UTI  Likely the source of his infection and a fever. Patient initially started on Levaquin and switch to on empiric imipenem and vancomycin. Urine culture showing enterococcus resistant to ampicillin and Levaquin..  Clinically improving and remains afebrile. Switch to fosfomycin 3 g every 4 8 hrs for 3 dose .( completes on 8/8).  Acute on chronic kidney injury  Likely in the setting of bilateral hydronephrosis with obstructive uropathy. Foley placed with good urine output. Repeat ultrasound shows persistent right-sided hydronephrosis patient started on Flomax. seen by urology consult Dr. Wynelle Link who recommended to continue Foley and followup with his urologist .  Patient follows with Dr. McDiarmid as outpatient.  Creatinine slowly improving and is 1.6 today, baseline of around 1.6-2.0 .   Cellulitis RLE  patient placed on IV vancomycin given feel outpatient therapy. switch to oral doxycycline to complete a 10 day course . ( completes on 8/10)   Elevated proBNP  Chest x-ray showed vascular congestion. discontinued fluids. 2 D echo showed normal EF. Patient desaturated to high 80s this am. CXR showing peripheral opacity , cannot r/o atelectasis, PNA vs nodularity. Has subpleural nodules on previous imaging as well. No signs of infection. likely atelectasis. Will provide bedside spirometry and albuterol nebs .  COPD   continue home medications  Left knee pain  Has history of knee placement. X-ray unremarkable. Reports pain over the leg as well and likely in the setting of neuropathy. Continue tramadol and Neurontin.    Multiple bilateral pulmonary nodules As seen on chest x-ray. On reviewing prior records he had bilateral multiple nodules seen on CT scan of the chest in 2013. No workup seems to have been done in the past. Given his acute kidney injury she cannot get a CT chest with contrast at this time and if other workup planned it should be done once his renal function is more stable.  Sinus Tachycardia  Likely in the setting of nebs use and patient on metoprolol which was held since admission. Now improved after metoprolol resumed and was adjusted.  Code Status: DNR   Family Communication: discussed with daughter on 8/4   Disposition Plan: planned for d/c home with services. patient quite deconditioned and daughter hesitant to take him home . Plan on d/c to SNF but patient has used up all his NH days. Requested for LTAC eval today. Can be discharged as early as 8/8. A discharge summary was completed on 8/4  Consultants:  Dr. Wynelle Link ( urology)    Procedures:  None   Antibiotics:  IV Levaquin (7/31-8/2)  IV VANCOMYCIN (8/1-8/4)  PO FOSFOMYCIN 8/4-8/8  Po doxycycline 8/6-8/10   HPI/Subjective:  Had loose BM yesterday. c diff negative. HR better controlled      Objective: Filed Vitals:   05/04/13 0959  BP: 144/60  Pulse: 80  Temp: 98.3 F (36.8 C)  Resp: 18    Intake/Output Summary (Last 24 hours) at 05/04/13 1605 Last data filed at  05/03/13 1700  Gross per 24 hour  Intake    120 ml  Output    300 ml  Net   -180 ml   Filed Weights   05/01/13 2208 05/02/13 2100 05/03/13 2042  Weight: 73.4 kg (161 lb 13.1 oz) 72.3 kg (159 lb 6.3 oz) 73.301 kg (161 lb 9.6 oz)    Exam:  General: elderly male lying in bed appears fatigued and frail  HEENT: No pallor, moist oral mucosa   Chest: bilateral scattered rhonchi no added sounds  CVS: S1 and S2 tachycardic , no murmurs rub or gallop  Abdomen: Soft, nontender, nondistended, bowel sounds present, Foley in place  Extremities: Warm, cellulitis over the leg resolved. swelling over left knee also resolving  CNS: AAO x3   Data Reviewed: Basic Metabolic Panel:  Recent Labs Lab 04/28/13 0450 04/29/13 0515 04/30/13 0940 05/01/13 0435 05/03/13 0600 05/04/13 1045  NA 145 145 142 143 142  --   K 4.3 3.5 3.8 3.7 3.7  --   CL 110 110 108 109 106  --   CO2 21 21 26 25 27   --   GLUCOSE 73 93 103* 87 90  --   BUN 46* 36* 24* 21 21  --   CREATININE 2.83* 2.43* 1.98* 1.82* 1.72* 1.67*  CALCIUM 8.3* 8.3* 8.4 8.5 9.5  --    Liver Function Tests:  Recent Labs Lab 04/30/13 0940  AST 20  ALT 14  ALKPHOS 139*  BILITOT 0.4  PROT 5.5*  ALBUMIN 2.1*   No results found for this basename: LIPASE, AMYLASE,  in the last 168 hours No results found for this basename: AMMONIA,  in the last 168 hours CBC:  Recent Labs Lab 04/28/13 0450  WBC 8.0  HGB 8.5*  HCT 26.1*  MCV 94.9  PLT 164   Cardiac Enzymes: No results found for this basename: CKTOTAL, CKMB, CKMBINDEX, TROPONINI,  in the last 168 hours BNP (last 3 results)  Recent Labs  06/05/12 0524 01/03/13 0552 04/27/13 1455  PROBNP 506.6* 602.9* 3460.0*   CBG:  Recent Labs Lab 04/28/13 1154 05/02/13 2056  GLUCAP 93 89    Recent Results (from the past 240 hour(s))  URINE CULTURE     Status: None   Collection Time    04/27/13  4:02 PM      Result Value Range Status   Specimen Description URINE, CATHETERIZED   Final   Special Requests NONE   Final   Culture  Setup Time 04/27/2013 17:21   Final   Colony Count >=100,000 COLONIES/ML   Final   Culture ENTEROCOCCUS SPECIES   Final   Report Status 04/29/2013 FINAL   Final   Organism ID, Bacteria ENTEROCOCCUS SPECIES   Final  CULTURE, BLOOD (ROUTINE X 2)     Status: None   Collection Time    04/27/13   4:24 PM      Result Value Range Status   Specimen Description BLOOD ARM RIGHT   Final   Special Requests BOTTLES DRAWN AEROBIC ONLY [facility]   Final   Culture  Setup Time     Final   Value: 04/28/2013 01:20     Performed at [facility]   Culture     Final   Value: NO GROWTH 5 DAYS     Performed at [facility]   Report Status 05/04/2013 FINAL   Final  CULTURE, BLOOD (ROUTINE X 2)     Status: None   Collection Time  04/27/13  4:30 PM      Result Value Range Status   Specimen Description BLOOD HAND RIGHT   Final   Special Requests BOTTLES DRAWN AEROBIC ONLY 3CC   Final   Culture  Setup Time 04/28/2013 01:20   Final   Culture     Final   Value: STAPHYLOCOCCUS SPECIES (COAGULASE NEGATIVE)     Note: THE SIGNIFICANCE OF ISOLATING THIS ORGANISM FROM A SINGLE SET OF BLOOD CULTURES WHEN MULTIPLE SETS ARE DRAWN IS UNCERTAIN. PLEASE NOTIFY THE MICROBIOLOGY DEPARTMENT WITHIN ONE WEEK IF SPECIATION AND SENSITIVITIES ARE REQUIRED.     Note: Gram Stain Report Called to,Read Back By and Verified With:  Ranelle Oyster [id number] BRMEL   Report Status 04/30/2013 FINAL   Final  CLOSTRIDIUM DIFFICILE BY PCR     Status: None   Collection Time    05/03/13  9:05 PM      Result Value Range Status   C difficile by pcr NEGATIVE  NEGATIVE Final     Studies: No results found.  Scheduled Meds: . acetaminophen  650 mg Oral Once  . calamine   Topical BID  . doxycycline  100 mg Oral Q12H  . enoxaparin (LOVENOX) injection  30 mg Subcutaneous Q24H  . fosfomycin  3 g Oral Q48H  . hydrocerin   Topical BID  . metoprolol tartrate  50 mg Oral BID  . tamsulosin  0.4 mg Oral QPC supper   Continuous Infusions:     Time spent: 25 minutes    Lanier Felty  [facility] Pager [phone]. If 7PM-7AM, please contact night-coverage at [email or web address], password [facility] 05/04/2013, 4:05 PM  LOS: 7 days

## 2013-05-04 NOTE — Progress Notes (Signed)
Patient refusing labs and food.  Patient more irritable than usual.  MD made aware.  No new orders at the present.  Will monitor patient closely.

## 2013-05-04 NOTE — Progress Notes (Signed)
Physical Therapy Treatment Patient Details Name: [name] MRN: [id number] DOB: August 26, 1928 Today's Date: 05/04/2013 Time: 8295-6213 PT Time Calculation (min): 55 min  PT Assessment / Plan / Recommendation  History of Present Illness 79 y.o. male with PMHx of COPD, recurrent UTIs and recent treatment for cellulitis who had called the EMS reportedly for shortness of breath. However it is of note the patient denies ever having shortness of breath. His main complaint was of not feeling well overall with some pain in his lower extremities   PT Comments   Pt initially a little irritable with PT, however with time pt very appreciative and asked PT to come back again.    Follow Up Recommendations  SNF     Does the patient have the potential to tolerate intense rehabilitation     Barriers to Discharge        Equipment Recommendations  None recommended by PT    Recommendations for Other Services    Frequency Min 3X/week   Progress towards PT Goals Progress towards PT goals: Progressing toward goals  Plan Current plan remains appropriate    Precautions / Restrictions Precautions Precautions: Fall Restrictions Weight Bearing Restrictions: No   Pertinent Vitals/Pain Denies pain.    Mobility  Bed Mobility Bed Mobility: Supine to Sit;Sitting - Scoot to Edge of Bed;Sit to Supine Supine to Sit: 5: Supervision;With rails;HOB elevated Sitting - Scoot to Edge of Bed: 5: Supervision Sit to Supine: 4: Min assist Details for Bed Mobility Assistance: A with LEs to return to bed.   Transfers Transfers: Sit to Stand;Stand to Sit Sit to Stand: 4: Min assist;With upper extremity assist;From bed Stand to Sit: 4: Min assist;With upper extremity assist;To bed Details for Transfer Assistance: Demos good use of UEs.  pt repeated transfer x6 for strengthening and safety.   Ambulation/Gait Ambulation/Gait Assistance: 4: Min assist Ambulation Distance (Feet): 3 Feet (x3) Assistive device: Rolling  walker Ambulation/Gait Assistance Details: pt amb 3' forward and back x3.  pt fatigeus quickly and easily SOB.   Gait Pattern: Shuffle;Trunk flexed;Decreased dorsiflexion - right;Decreased dorsiflexion - left;Step-through pattern;Decreased stride length Stairs: No Wheelchair Mobility Wheelchair Mobility: No    Exercises     PT Diagnosis:    PT Problem List:   PT Treatment Interventions:     PT Goals (current goals can now be found in the care plan section) Acute Rehab PT Goals Time For Goal Achievement: 05/12/13 Potential to Achieve Goals: Good  Visit Information  Last PT Received On: 05/04/13 Assistance Needed: +2 (pt request +2 to attempt longer ambulation.) History of Present Illness: 79 y.o. male with PMHx of COPD, recurrent UTIs and recent treatment for cellulitis who had called the EMS reportedly for shortness of breath. However it is of note the patient denies ever having shortness of breath. His main complaint was of not feeling well overall with some pain in his lower extremities    Subjective Data  Subjective: "I'm not sitting in that chair."   Cognition  Cognition Arousal/Alertness: Awake/alert Behavior During Therapy: WFL for tasks assessed/performed Overall Cognitive Status: No family/caregiver present to determine baseline cognitive functioning Memory: Decreased short-term memory    Balance  Balance Balance Assessed: Yes Static Standing Balance Static Standing - Balance Support: Bilateral upper extremity supported Static Standing - Level of Assistance: 5: Stand by assistance Static Standing - Comment/# of Minutes: pt stood x3 for ~2-34mins each time.  Attempting to increase standing activity tolerance.    End of Session PT - End of  Session Equipment Utilized During Treatment: Gait belt Activity Tolerance: Patient limited by fatigue Patient left: in bed;with call bell/phone within reach Nurse Communication: Mobility status   GP     Sunny Schlein,  [place] [phone] 05/04/2013, 2:52 PM

## 2013-05-05 DIAGNOSIS — J449 Chronic obstructive pulmonary disease, unspecified: Secondary | ICD-10-CM

## 2013-05-05 MED ORDER — BOOST / RESOURCE BREEZE PO LIQD: 1.0000 | Freq: Two times a day (BID) | ORAL | Status: DC

## 2013-05-05 MED ORDER — METOPROLOL TARTRATE 25 MG PO TABS: 50.0000 mg | ORAL_TABLET | Freq: Two times a day (BID) | ORAL | Status: DC

## 2013-05-05 MED ORDER — TRAMADOL-ACETAMINOPHEN 37.5-325 MG PO TABS: 1.0000 | ORAL_TABLET | Freq: Four times a day (QID) | ORAL | Status: DC | PRN

## 2013-05-05 MED ORDER — DOXYCYCLINE HYCLATE 100 MG PO TABS: 100.0000 mg | ORAL_TABLET | Freq: Two times a day (BID) | ORAL | Status: DC

## 2013-05-05 MED ORDER — CALAMINE EX LOTN: TOPICAL_LOTION | Freq: Two times a day (BID) | CUTANEOUS | Status: DC

## 2013-05-05 NOTE — Progress Notes (Signed)
Patient to discharge to [facility]

## 2013-05-05 NOTE — Progress Notes (Signed)
Patient d/c'd today to [facility]. DC arranged by nursing and RNCM. CSW signing off. Lorri Frederick. [place]  [phone]

## 2013-05-05 NOTE — Progress Notes (Signed)
[facility] admission  Room:  54 Dr. Angelina Ok Call report to:  [phone] ext [phone]  Fax DC summary to:  [phone]

## 2013-05-05 NOTE — Progress Notes (Signed)
INITIAL NUTRITION ASSESSMENT  DOCUMENTATION CODES Per approved criteria  -Not Applicable   INTERVENTION: 1. Resource Breeze po BID, each supplement provides 250 kcal and 9 grams of protein.   NUTRITION DIAGNOSIS: Inadequate oral intake related to poor appetite as evidenced by weight loss.   Goal: PO intake to meet >/=90% estimated nutrition needs  Monitor:  PO intake, weight trends, labs, I/O's  Reason for Assessment: Malnutrition screening tool  79 y.o. male  Admitting Dx: UTI (lower urinary tract infection)  ASSESSMENT: Pt admitted with frequent UTI's, presented with fever, pain in LE, and cellulitis. Found to have acute on chronic kidney disease. Bilateral hydronephrosis, foley placed with good urine output and decrease in Scr. Planned d/c likely to [facility] per rounding.  RD pulled to pt for malnutrition screening tool report of unintentional weight loss and poor oral intake this admission. Po intake variable per meal completion documentation. Weight down from admission weight, some likely related to fluid status as pt is -3.7 L this admission.  Pt states he has not been eating well, decreased appetite. Does not like Ensure, willing to try [employer].   Nutrition Focused Physical Exam:  Subcutaneous Fat:  Orbital Region: WNL Upper Arm Region: WNL Thoracic and Lumbar Region: WNL  Muscle:  Temple Region: WNL Clavicle Bone Region: WNL Clavicle and Acromion Bone Region: WNL Scapular Bone Region: n/a Dorsal Hand: n/a Patellar Region: mild wasting Anterior Thigh Region: WNL Posterior Calf Region: mild wasting  Edema: none   Height: Ht Readings from Last 1 Encounters:  05/04/13 5\' 6"  (1.676 m)    Weight: Wt Readings from Last 1 Encounters:  05/04/13 159 lb 4.8 oz (72.258 kg)  Admission weight of 163 lbs.   Ideal Body Weight: 142 lbs   % Ideal Body Weight: 111%  Wt Readings from Last 10 Encounters:  05/04/13 159 lb 4.8 oz (72.258 kg)  03/03/13 172 lb  12.8 oz (78.382 kg)  03/01/13 161 lb 1.6 oz (73.074 kg)  02/06/13 168 lb 6.4 oz (76.386 kg)  01/06/13 175 lb 1.6 oz (79.425 kg)  06/04/12 176 lb (79.833 kg)  10/13/11 163 lb 2.3 oz (74 kg)  05/15/09 176 lb 4 oz (79.946 kg)    Usual Body Weight: 160 lbs per pt report, 175 lbs per weight hx.   % Usual Body Weight: 91%  BMI:  Body mass index is 25.72 kg/(m^2). Overweight   Estimated Nutritional Needs: Kcal: 1600-1800 Protein: 70-80 gm  Fluid: 1.6-1.8 L   Skin: intact   Diet Order: General  EDUCATION NEEDS: -No education needs identified at this time   Intake/Output Summary (Last 24 hours) at 05/05/13 1001 Last data filed at 05/05/13 0900  Gross per 24 hour  Intake    120 ml  Output      0 ml  Net    120 ml    Last BM: 8/7   Labs:   Recent Labs Lab 04/30/13 0940 05/01/13 0435 05/03/13 0600 05/04/13 1045  NA 142 143 142  --   K 3.8 3.7 3.7  --   CL 108 109 106  --   CO2 26 25 27   --   BUN 24* 21 21  --   CREATININE 1.98* 1.82* 1.72* 1.67*  CALCIUM 8.4 8.5 9.5  --   GLUCOSE 103* 87 90  --     CBG (last 3)   Recent Labs  05/02/13 2056  GLUCAP 89    Scheduled Meds: . acetaminophen  650 mg Oral Once  .  calamine   Topical BID  . doxycycline  100 mg Oral Q12H  . enoxaparin (LOVENOX) injection  30 mg Subcutaneous Q24H  . hydrocerin   Topical BID  . metoprolol tartrate  50 mg Oral BID  . tamsulosin  0.4 mg Oral QPC supper    Continuous Infusions:   Past Medical History  Diagnosis Date  . Hypertension   . Arthritis   . Ex-smoker   . Rheumatoid arthritis(714.0)   . Pulmonary nodule   . DDD (degenerative disc disease)   . Complication of anesthesia     " I am difficult to wake up "  . Chronic kidney disease     acute on chronic  . Anemia     Past Surgical History  Procedure Laterality Date  . Joint replacement      bilateral knee replacement  . Hernia repair    . Back surgery      X 2  . Appendectomy      Clarene Duke RD,  LDN Pager [phone] After Hours pager [phone]

## 2013-05-05 NOTE — Discharge Summary (Signed)
Physician Discharge Summary  [name]:[id number] DOB: 31-Jan-1928 DOA: 04/27/2013  PCP: [name], MD  Admit date: 04/27/2013 Discharge date: 05/05/2013  Time spent: Greater than 30 minutes  Recommendations for Outpatient Follow-up:  1. Dr. [name], Urology: please make out [name] follow up appointment asap. 2. Continue Foley catheter until urology followup. 3. Recommend followup chest x-ray or CT chest (as renal function permits) to evaluate peripheral opacity at the lower lateral right chest of uncertain etiology (? Infiltrate, atelectasis or nodularity) that was seen on chest x-ray on 04/29/13. [name] does have history of bilateral pulmonary nodules on CT scan of chest in 2013. May consider pulmonology consultation. 4. Followup repeat labs (CBC & renal panel) in 1 week. 5. Dr. [name], PCP: Outpatient followup after discharge from [facility].  Discharge Diagnoses:  Principal Problem:   UTI (lower urinary tract infection) Active Problems:   HYPERTENSION   Bilateral cellulitis of lower leg   ARF (acute renal failure)   COPD (chronic obstructive pulmonary disease)   CKD (chronic kidney disease) stage 3, GFR 30-59 ml/min   Obstructive uropathy   Sinus tachycardia   Discharge Condition: Improved & Stable  Diet recommendation: Heart healthy diet  Filed Weights   05/02/13 2100 05/03/13 2042 05/04/13 2119  Weight: 72.3 kg (159 lb 6.3 oz) 73.301 kg (161 lb 9.6 oz) 72.258 kg (159 lb 4.8 oz)    History of present illness:  79 year old male with a history of COPD and recurrent UTIs presented with not feeling well for the last few days and pain over his lower extremities. [name] noted to be febrile with temperature of 101F. [name] found to have acute on chronic kidney EGD with chest x-ray showing vascular congestion. [name] also found to have persistent cellulitis in his lower extremity and UTI.  Renal ultrasound showed bilateral hydronephrosis. R>L   Hospital Course:    Enterococcus UTI/recurrent UTIs  Likely the source of his infection and a fever. [name] initially started on Levaquin and switched to on empiric imipenem and vancomycin. Urine culture showing enterococcus resistant to ampicillin and Levaquin. Clinically improving and remains afebrile. Switched to fosfomycin 3 g every 48 hrs for 3 dose. Completed antibiotics on 05/05/13. Please refer to detailed urology consultation note of 04/30/13. Per urology, not a candidate for bilateral double J stent at this time. Prior urine culture results in urology office had shown Proteus UTI and current one shows enterococcus. Since different organisms each time, less likely to be a persistent infection due to her nidus of infection. Thereby will not discharge on it suppressive antibiotic therapy-will defer to outpatient urology followup.  Acute on chronic kidney injury  Likely in the setting of bilateral hydronephrosis with obstructive uropathy. Foley placed with good urine output. Repeat ultrasound shows persistent right-sided hydronephrosis & [name] started on Flomax. Seen by urology consult Dr. Wynelle Link who recommended to continue Foley and followup with his urologist.  [name] follows with Dr. McDiarmid as outpatient.  Creatinine slowly improving and is 1.6, baseline of around 1.6-2.0 .   Cellulitis RLE  [name] placed on IV vancomycin given failed outpatient therapy. Switched to oral doxycycline to complete a 10 day course . ( completes on 8/10). Improved/resolved clinically.   Elevated proBNP  Chest x-ray showed vascular congestion. Discontinued fluids. 2 D echo showed normal EF. [name] desaturated to high 80s. CXR showing peripheral opacity , cannot r/o atelectasis, PNA vs nodularity. Has subpleural nodules on previous imaging as well. No signs of infection. likely atelectasis. Will provide bedside spirometry and  albuterol nebs . Resolved hypoxia. OP follow up with repeat CXR/CT chest  COPD  Continue home  medications. Stable.   Left knee pain  Has history of knee placement. X-ray unremarkable. Reports pain over the leg as well and likely in the setting of neuropathy. Continue tramadol and Neurontin. Does not complain of pain on DC.  Multiple bilateral pulmonary nodules  As seen on chest x-ray. On reviewing prior records [name] had bilateral multiple nodules seen on CT scan of the chest in 2013. No workup seems to have been done in the past. Given his acute kidney injury [name] cannot get a CT chest with contrast at this time and if other workup planned it should be done once his renal function is more stable.   Sinus Tachycardia  Likely in the setting of nebs use and [name] on metoprolol which was held since admission. Now improved after metoprolol resumed and was adjusted. Resolved.  Anemia Are likely secondary to chronic disease & chronic kidney disease. Fairly stable. Follow up CBC as outpatient.  DO NOT RESUSCITATE   Procedures:  Foley catheter.   Consultations:  Urology  Discharge Exam:  Complaints: [name] denies complaints. Denies diarrhea or pain. No dyspnea.  Filed Vitals:   05/04/13 0959 05/04/13 1312 05/04/13 2119 05/05/13 0554  BP: 144/60 129/64 138/66 109/71  Pulse: 80 86 79 81  Temp: 98.3 F (36.8 C) 98.5 F (36.9 C) 97.9 F (36.6 C) 97.8 F (36.6 C)  TempSrc: Oral Oral Oral Oral  Resp: 18 17 18 18   Height:   5\' 6"  (1.676 m)   Weight:   72.258 kg (159 lb 4.8 oz)   SpO2: 100% 100% 98% 94%     General exam: Comfortable. Elderly frail male.  Respiratory system: Slightly reduced breath sounds in the bases but without crackles, rhonchi or wheezing. No increased work of breathing.  Cardiovascular system: S1 and S2 heard, RRR. No JVD, murmurs or pedal edema. Telemetry: Sinus rhythm.  Gastrointestinal system: Abdomen is nondistended, soft and nontender. Normal bowel sounds heard. Foley catheter present.  Central nervous system: Alert and oriented. No focal  neurological deficits.  Extremities: Symmetric 5 x 5 power. No features of cellulitis at this time.  Discharge Instructions      Discharge Orders   Future Orders Complete By Expires     (HEART FAILURE PATIENTS) Call MD:  Anytime you have any of the following symptoms: 1) 3 pound weight gain in 24 hours or 5 pounds in 1 week 2) shortness of breath, with or without a dry hacking cough 3) swelling in the hands, feet or stomach 4) if you have to sleep on extra pillows at night in order to breathe.  As directed     Call MD for:  difficulty breathing, headache or visual disturbances  As directed     Call MD for:  extreme fatigue  As directed     Call MD for:  persistant dizziness or light-headedness  As directed     Call MD for:  persistant nausea and vomiting  As directed     Call MD for:  redness, tenderness, or signs of infection (pain, swelling, redness, odor or green/yellow discharge around incision site)  As directed     Call MD for:  severe uncontrolled pain  As directed     Call MD for:  temperature >100.4  As directed     Diet - low sodium heart healthy  As directed     Discharge instructions  As directed  Comments:      1. Keep Foley catheter until follow up with Urology as out [name].    Increase activity slowly  As directed         Medication List    STOP taking these medications       cefUROXime 500 MG tablet  Commonly known as:  CEFTIN     traMADol 50 MG tablet  Commonly known as:  ULTRAM     trimethoprim 100 MG tablet  Commonly known as:  TRIMPEX      TAKE these medications       albuterol (2.5 MG/3ML) 0.083% nebulizer solution  Commonly known as:  PROVENTIL  Take 2.5 mg by nebulization every 4 (four) hours as needed. For shortness of breath     alendronate 70 MG tablet  Commonly known as:  FOSAMAX  Take 70 mg by mouth every 7 (seven) days. Take with a full glass of water on an empty stomach.  On Friday     ALIGN 4 MG Caps  Take 1 capsule by mouth every  morning.     aspirin EC 81 MG tablet  Take 81 mg by mouth every morning.     calamine lotion  Apply topically 2 (two) times daily. Apply to left thigh and buttock for itching and dry skin     doxycycline 100 MG tablet  Commonly known as:  VIBRA-TABS  Take 1 tablet (100 mg total) by mouth 2 (two) times daily. Discontinue after 05/07/13 doses.     escitalopram 10 MG tablet  Commonly known as:  LEXAPRO  Take 10 mg by mouth daily.     eucerin cream  Apply 1 application topically as needed for dry skin.     feeding supplement Liqd  Take 1 Container by mouth 2 (two) times daily between meals.     flunisolide 29 MCG/ACT (0.025%) nasal spray  Commonly known as:  NASAREL  Place 2 sprays into the nose 2 (two) times daily. Dose is for each nostril.     furosemide 20 MG tablet  Commonly known as:  LASIX  Take 20 mg by mouth 2 (two) times daily. One tablet in the morning and half tablet in the evening.     gabapentin 100 MG capsule  Commonly known as:  NEURONTIN  Take 300 mg by mouth 2 (two) times daily.     leflunomide 20 MG tablet  Commonly known as:  ARAVA  Take 20 mg by mouth daily.     metoprolol tartrate 25 MG tablet  Commonly known as:  LOPRESSOR  Take 2 tablets (50 mg total) by mouth 2 (two) times daily.     omeprazole 20 MG capsule  Commonly known as:  PRILOSEC  Take 20 mg by mouth daily.     predniSONE 5 MG tablet  Commonly known as:  DELTASONE  Take 5 mg by mouth daily.     QUEtiapine 25 MG tablet  Commonly known as:  SEROQUEL  Take 25 mg by mouth at bedtime.     SUPER B COMPLEX/C Caps  Take 1 capsule by mouth daily.     tamsulosin 0.4 MG Caps capsule  Commonly known as:  FLOMAX  Take 1 capsule (0.4 mg total) by mouth daily after supper.     traMADol-acetaminophen 37.5-325 MG per tablet  Commonly known as:  ULTRACET  Take 1 tablet by mouth every 6 (six) hours as needed for pain.     Vitamin D 2000 UNITS tablet  Take 2,000  Units by mouth every morning.        Follow-up Information   Follow up with [name], MD In 1 week. (please check BMET)    Contact information:   [address] [place] [address] [phone]       Follow up with MCDIARMID,TODD D, MD On 05/05/2013.   Contact information:   [address] [place] [address] [phone]        The results of significant diagnostics from this hospitalization (including imaging, microbiology, ancillary and laboratory) are listed below for reference.    Significant Diagnostic Studies: Ct Abdomen Pelvis Wo Contrast  04/29/2013   *RADIOLOGY REPORT*  Clinical Data: Abdominal pain  CT ABDOMEN AND PELVIS WITHOUT CONTRAST  Technique:  Multidetector CT imaging of the abdomen and pelvis was performed following the standard protocol without intravenous contrast.  Comparison: 04/28/2013, 06/07/2012  Findings: The lung bases demonstrate multiple subpleural nodules and mild atelectasis in the right lung base. These changes are stable from prior exam  The liver, spleen, adrenal glands and pancreas are within normal limits.  The gallbladder is peripherally calcified and contracted. These changes are stable from prior examination.  Bilateral hydronephrosis is noted.  This is stable from the ultrasound from previous day.  The ureters are dilated to the level of the urinary bladder which is decompressed by Foley catheter. This raises suspicion for bladder abnormality as the etiology of the hydronephrosis.  An abdominal wall hernia is identified containing some bowel loops without incarceration.  These changes are stable from prior exam. The appendix is not well visualized and likely surgically removed. Diverticular change without diverticulitis is noted.  No pelvic mass lesion is seen.  IMPRESSION: Persistent bilateral hydronephrosis and hydroureter as described. These changes persist despite decompression by Foley catheter of the bladder.  There may be distal ureteral stenosis.   Changes in lung bases bilaterally stable from prior exam.  CT of the chest for further characterization may be helpful   Original Report Authenticated By: Alcide Clever, M.D.   Dg Chest 1 View  04/27/2013   *RADIOLOGY REPORT*  Clinical Data: Shortness of breath, weakness.  Lower extremity and back pain.  Fever.  CHEST - 1 VIEW  Comparison: 02/27/2013  Findings: Elevation of the right hemidiaphragm, stable.  Right base atelectasis or scarring, stable.  Heart is mildly enlarged.  Mild diffuse interstitial prominence of the lungs.  Mild vascular congestion.  No visible effusions or acute bony abnormality.  IMPRESSION: Cardiomegaly, vascular congestion.  Cannot exclude early interstitial edema.  Stable elevation of the right hemidiaphragm with right base atelectasis or scarring.   Original Report Authenticated By: Charlett Nose, M.D.   Dg Chest 2 View  05/02/2013   *RADIOLOGY REPORT*  Clinical Data: Shortness of breath  CHEST - 2 VIEW  Comparison: 04/29/2013  Findings: Enlargement of cardiac silhouette post median sternotomy. Tortuous thoracic aorta. Slight pulmonary vascular congestion. Chronic elevation of right diaphragm. Bibasilar atelectasis greater on the right. Peripheral opacity at the mid lateral lower left chest could be related to infiltrate, atelectasis or underlying nodularity. Remaining lungs clear. No gross pleural effusion or pneumothorax. Bones appear diffusely demineralized.  IMPRESSION: Enlargement of cardiac silhouette. Bibasilar atelectasis. Peripheral opacity at the lower lateral right chest of uncertain etiology, question infiltrate, atelectasis or nodularity; consider follow-up CT chest for further evaluation.   Original Report Authenticated By: Ulyses Southward, M.D.   Dg Chest 2 View  04/29/2013   *RADIOLOGY REPORT*  Clinical Data: Shortness of breath.  Wheezing.  CHEST - 2 VIEW  Comparison: Chest x-ray 04/27/2013.  Findings: Lung volumes are low.  There are some bibasilar opacities favored to  reflect subsegmental atelectasis.  No definite acute consolidative airspace disease.  Trace bilateral pleural effusions. Multiple ill-defined nodular opacities again noted throughout the lungs bilaterally, likely represents metastatic disease (better demonstrated on prior chest CT examinations).  Cephalization of the pulmonary vasculature, without frank pulmonary edema.  Heart size is within normal limits. The [name] is rotated to the left on today's exam, resulting in distortion of the mediastinal contours and reduced diagnostic sensitivity and specificity for mediastinal pathology.  Status post hemi-median sternotomy.  IMPRESSION: 1.  Multiple ill-defined nodular densities throughout the lungs bilaterally suspicious for potential metastatic disease. This could be better evaluated with contrast enhanced chest CT if clinically indicated. 2. Mild pulmonary venous congestion, without frank pulmonary edema.   Original Report Authenticated By: Trudie Reed, M.D.   US Renal  04/28/2013   *RADIOLOGY REPORT*  Clinical Data:  Follow up obstruction  RENAL/URINARY TRACT ULTRASOUND COMPLETE  Comparison:  04/28/2013  Findings:  Right Kidney:  Measures 11.9 cm.  There is increased cortical echogenicity. Persistent moderate right-sided hydronephrosis. This does not appear significantly improved from previous exam.  Left Kidney:  Measures 11.2 cm in length.  Increased cortical echogenicity. No change in left hydronephrosis.  Bladder:  Collapsed around a Foley catheter.  IMPRESSION:  1.  No significant change in bilateral hydronephrosis.   Original Report Authenticated By: Signa Kell, M.D.   US Renal  04/28/2013   *RADIOLOGY REPORT*  Clinical Data: Recurrent urinary tract infections and renal failure.  RENAL/URINARY TRACT ULTRASOUND COMPLETE  Comparison:  06/10/2012  Findings:  Right Kidney:  12 cm in length.  Moderately severe hydronephrosis present.  The cortex is echogenic.  Left Kidney:  10.2 cm in length.   Moderately severe hydronephrosis present.  The cortex is echogenic.  Bladder:  The bladder is irregular in appearance with irregular wall thickening and potential internal areas of septation. Underlying tumor or debris in the bladder cannot be excluded by ultrasound.  IMPRESSION: Significant bilateral hydronephrosis present.  Hydronephrosis was not present on the prior renal ultrasound.  Level of obstruction may be at the bladder where irregular wall thickening is identified.  Findings were communicated by telephone to Maren Reamer with [facility].   Original Report Authenticated By: Irish Lack, M.D.   Dg Knee Left Port  05/01/2013   *RADIOLOGY REPORT*  Clinical Data: Pain, swelling.  PORTABLE LEFT KNEE - 1-2 VIEW  Comparison: None.  Findings: Prior left knee replacement.  No hardware bony complicating feature. No acute bony abnormality.  Specifically, no fracture, subluxation, or dislocation.  Soft tissues are intact. No joint effusion.  IMPRESSION: Left knee replacement.  No complicating feature. No acute findings.   Original Report Authenticated By: Charlett Nose, M.D.    Microbiology: Recent Results (from the past 240 hour(s))  URINE CULTURE     Status: None   Collection Time    04/27/13  4:02 PM      Result Value Range Status   Specimen Description URINE, CATHETERIZED   Final   Special Requests NONE   Final   Culture  Setup Time 04/27/2013 17:21   Final   Colony Count >=100,000 COLONIES/ML   Final   Culture ENTEROCOCCUS SPECIES   Final   Report Status 04/29/2013 FINAL   Final   Organism ID, Bacteria ENTEROCOCCUS SPECIES   Final  CULTURE, BLOOD (ROUTINE X 2)  Status: None   Collection Time    04/27/13  4:24 PM      Result Value Range Status   Specimen Description BLOOD ARM RIGHT   Final   Special Requests BOTTLES DRAWN AEROBIC ONLY [facility]   Final   Culture  Setup Time     Final   Value: 04/28/2013 01:20     Performed at [facility]   Culture     Final   Value: NO  GROWTH 5 DAYS     Performed at [facility]   Report Status 05/04/2013 FINAL   Final  CULTURE, BLOOD (ROUTINE X 2)     Status: None   Collection Time    04/27/13  4:30 PM      Result Value Range Status   Specimen Description BLOOD HAND RIGHT   Final   Special Requests BOTTLES DRAWN AEROBIC ONLY [facility]   Final   Culture  Setup Time 04/28/2013 01:20   Final   Culture     Final   Value: STAPHYLOCOCCUS SPECIES (COAGULASE NEGATIVE)     Note: THE SIGNIFICANCE OF ISOLATING THIS ORGANISM FROM A SINGLE SET OF BLOOD CULTURES WHEN MULTIPLE SETS ARE DRAWN IS UNCERTAIN. PLEASE NOTIFY THE MICROBIOLOGY DEPARTMENT WITHIN ONE WEEK IF SPECIATION AND SENSITIVITIES ARE REQUIRED.     Note: Gram Stain Report Called to,Read Back By and Verified With:  Ranelle Oyster [id number] BRMEL   Report Status 04/30/2013 FINAL   Final  CLOSTRIDIUM DIFFICILE BY PCR     Status: None   Collection Time    05/03/13  9:05 PM      Result Value Range Status   C difficile by pcr NEGATIVE  NEGATIVE Final     Labs: Basic Metabolic Panel:  Recent Labs Lab 04/29/13 0515 04/30/13 0940 05/01/13 0435 05/03/13 0600 05/04/13 1045  NA 145 142 143 142  --   K 3.5 3.8 3.7 3.7  --   CL 110 108 109 106  --   CO2 21 26 25 27   --   GLUCOSE 93 103* 87 90  --   BUN 36* 24* 21 21  --   CREATININE 2.43* 1.98* 1.82* 1.72* 1.67*  CALCIUM 8.3* 8.4 8.5 9.5  --    Liver Function Tests:  Recent Labs Lab 04/30/13 0940  AST 20  ALT 14  ALKPHOS 139*  BILITOT 0.4  PROT 5.5*  ALBUMIN 2.1*   No results found for this basename: LIPASE, AMYLASE,  in the last 168 hours No results found for this basename: AMMONIA,  in the last 168 hours CBC: No results found for this basename: WBC, NEUTROABS, HGB, HCT, MCV, PLT,  in the last 168 hours Cardiac Enzymes: No results found for this basename: CKTOTAL, CKMB, CKMBINDEX, TROPONINI,  in the last 168 hours BNP: BNP (last 3 results)  Recent Labs  06/05/12 0524 01/03/13 0552  04/27/13 1455  PROBNP 506.6* 602.9* 3460.0*   CBG:  Recent Labs Lab 04/28/13 1154 05/02/13 2056  GLUCAP 93 89    Additional labs:    Signed:  Terena Bohan  [facility] 05/05/2013, 10:54 AM

## 2013-07-31 ENCOUNTER — Encounter (HOSPITAL_COMMUNITY): Payer: Self-pay | Admitting: Emergency Medicine

## 2013-07-31 ENCOUNTER — Inpatient Hospital Stay (HOSPITAL_COMMUNITY)
Admission: EM | Admit: 2013-07-31 | Discharge: 2013-08-08 | DRG: 871 | Disposition: A | Discharge status: Other Acute Inpatient Hospital | Payer: Medicare Other | Attending: Internal Medicine | Admitting: Internal Medicine

## 2013-07-31 ENCOUNTER — Inpatient Hospital Stay (HOSPITAL_COMMUNITY): Payer: Medicare Other

## 2013-07-31 ENCOUNTER — Emergency Department (HOSPITAL_COMMUNITY): Payer: Medicare Other

## 2013-07-31 DIAGNOSIS — D649 Anemia, unspecified: Secondary | ICD-10-CM

## 2013-07-31 DIAGNOSIS — I499 Cardiac arrhythmia, unspecified: Secondary | ICD-10-CM | POA: Diagnosis not present

## 2013-07-31 DIAGNOSIS — J9600 Acute respiratory failure, unspecified whether with hypoxia or hypercapnia: Secondary | ICD-10-CM | POA: Diagnosis not present

## 2013-07-31 DIAGNOSIS — G9340 Encephalopathy, unspecified: Secondary | ICD-10-CM | POA: Diagnosis present

## 2013-07-31 DIAGNOSIS — N390 Urinary tract infection, site not specified: Secondary | ICD-10-CM | POA: Diagnosis present

## 2013-07-31 DIAGNOSIS — J690 Pneumonitis due to inhalation of food and vomit: Secondary | ICD-10-CM | POA: Diagnosis present

## 2013-07-31 DIAGNOSIS — E872 Acidosis, unspecified: Secondary | ICD-10-CM | POA: Diagnosis not present

## 2013-07-31 DIAGNOSIS — Z96659 Presence of unspecified artificial knee joint: Secondary | ICD-10-CM

## 2013-07-31 DIAGNOSIS — A419 Sepsis, unspecified organism: Principal | ICD-10-CM | POA: Diagnosis present

## 2013-07-31 DIAGNOSIS — Z87440 Personal history of urinary (tract) infections: Secondary | ICD-10-CM

## 2013-07-31 DIAGNOSIS — M069 Rheumatoid arthritis, unspecified: Secondary | ICD-10-CM | POA: Diagnosis present

## 2013-07-31 DIAGNOSIS — E874 Mixed disorder of acid-base balance: Secondary | ICD-10-CM | POA: Diagnosis not present

## 2013-07-31 DIAGNOSIS — E870 Hyperosmolality and hypernatremia: Secondary | ICD-10-CM | POA: Diagnosis not present

## 2013-07-31 DIAGNOSIS — Z8249 Family history of ischemic heart disease and other diseases of the circulatory system: Secondary | ICD-10-CM

## 2013-07-31 DIAGNOSIS — E876 Hypokalemia: Secondary | ICD-10-CM | POA: Diagnosis not present

## 2013-07-31 DIAGNOSIS — N19 Unspecified kidney failure: Secondary | ICD-10-CM

## 2013-07-31 DIAGNOSIS — T68XXXA Hypothermia, initial encounter: Secondary | ICD-10-CM

## 2013-07-31 DIAGNOSIS — IMO0002 Reserved for concepts with insufficient information to code with codable children: Secondary | ICD-10-CM

## 2013-07-31 DIAGNOSIS — D62 Acute posthemorrhagic anemia: Secondary | ICD-10-CM | POA: Diagnosis present

## 2013-07-31 DIAGNOSIS — R7309 Other abnormal glucose: Secondary | ICD-10-CM | POA: Diagnosis present

## 2013-07-31 DIAGNOSIS — J40 Bronchitis, not specified as acute or chronic: Secondary | ICD-10-CM

## 2013-07-31 DIAGNOSIS — I10 Essential (primary) hypertension: Secondary | ICD-10-CM | POA: Diagnosis present

## 2013-07-31 DIAGNOSIS — N139 Obstructive and reflux uropathy, unspecified: Secondary | ICD-10-CM | POA: Diagnosis present

## 2013-07-31 DIAGNOSIS — N179 Acute kidney failure, unspecified: Secondary | ICD-10-CM | POA: Diagnosis present

## 2013-07-31 DIAGNOSIS — Z79899 Other long term (current) drug therapy: Secondary | ICD-10-CM

## 2013-07-31 DIAGNOSIS — K921 Melena: Secondary | ICD-10-CM | POA: Diagnosis present

## 2013-07-31 DIAGNOSIS — R5381 Other malaise: Secondary | ICD-10-CM

## 2013-07-31 DIAGNOSIS — N1330 Unspecified hydronephrosis: Secondary | ICD-10-CM | POA: Diagnosis present

## 2013-07-31 DIAGNOSIS — Z889 Allergy status to unspecified drugs, medicaments and biological substances status: Secondary | ICD-10-CM

## 2013-07-31 DIAGNOSIS — J4489 Other specified chronic obstructive pulmonary disease: Secondary | ICD-10-CM | POA: Diagnosis present

## 2013-07-31 DIAGNOSIS — J449 Chronic obstructive pulmonary disease, unspecified: Secondary | ICD-10-CM | POA: Diagnosis present

## 2013-07-31 DIAGNOSIS — Z87891 Personal history of nicotine dependence: Secondary | ICD-10-CM

## 2013-07-31 DIAGNOSIS — J9602 Acute respiratory failure with hypercapnia: Secondary | ICD-10-CM | POA: Diagnosis present

## 2013-07-31 DIAGNOSIS — E46 Unspecified protein-calorie malnutrition: Secondary | ICD-10-CM | POA: Diagnosis present

## 2013-07-31 DIAGNOSIS — N183 Chronic kidney disease, stage 3 unspecified: Secondary | ICD-10-CM | POA: Diagnosis present

## 2013-07-31 DIAGNOSIS — Z7982 Long term (current) use of aspirin: Secondary | ICD-10-CM

## 2013-07-31 DIAGNOSIS — Z880 Allergy status to penicillin: Secondary | ICD-10-CM

## 2013-07-31 DIAGNOSIS — K2270 Barrett's esophagus without dysplasia: Secondary | ICD-10-CM | POA: Diagnosis present

## 2013-07-31 DIAGNOSIS — G9341 Metabolic encephalopathy: Secondary | ICD-10-CM | POA: Diagnosis not present

## 2013-07-31 DIAGNOSIS — I129 Hypertensive chronic kidney disease with stage 1 through stage 4 chronic kidney disease, or unspecified chronic kidney disease: Secondary | ICD-10-CM | POA: Diagnosis present

## 2013-07-31 DIAGNOSIS — I509 Heart failure, unspecified: Secondary | ICD-10-CM | POA: Diagnosis not present

## 2013-07-31 HISTORY — DX: Metabolic encephalopathy: G93.41

## 2013-07-31 HISTORY — DX: Barrett's esophagus without dysplasia: K22.70

## 2013-07-31 HISTORY — DX: Other gram-negative sepsis: A41.59

## 2013-07-31 HISTORY — DX: Polyp of colon: K63.5

## 2013-07-31 LAB — CBC
HCT: 31.4 % — ABNORMAL LOW (ref 39.0–52.0)
Hemoglobin: 10 g/dL — ABNORMAL LOW (ref 13.0–17.0)
MCH: 29.5 pg (ref 26.0–34.0)
MCHC: 31.8 g/dL (ref 30.0–36.0)
MCV: 92.6 fL (ref 78.0–100.0)
Platelets: 172 10*3/uL (ref 150–400)
RBC: 3.39 MIL/uL — ABNORMAL LOW (ref 4.22–5.81)
RDW: 17.7 % — ABNORMAL HIGH (ref 11.5–15.5)
WBC: 9.3 10*3/uL (ref 4.0–10.5)

## 2013-07-31 LAB — URINE MICROSCOPIC-ADD ON

## 2013-07-31 LAB — URINALYSIS, ROUTINE W REFLEX MICROSCOPIC
Bilirubin Urine: NEGATIVE
Glucose, UA: NEGATIVE mg/dL
Ketones, ur: NEGATIVE mg/dL
Nitrite: NEGATIVE
Protein, ur: 30 mg/dL — AB
Specific Gravity, Urine: 1.013 (ref 1.005–1.030)
Urobilinogen, UA: 0.2 mg/dL (ref 0.0–1.0)
pH: 5 (ref 5.0–8.0)

## 2013-07-31 LAB — BASIC METABOLIC PANEL
BUN: 48 mg/dL — ABNORMAL HIGH (ref 6–23)
CO2: 14 mEq/L — ABNORMAL LOW (ref 19–32)
Calcium: 9.7 mg/dL (ref 8.4–10.5)
Chloride: 114 mEq/L — ABNORMAL HIGH (ref 96–112)
Creatinine, Ser: 3.73 mg/dL — ABNORMAL HIGH (ref 0.50–1.35)
GFR calc Af Amer: 16 mL/min — ABNORMAL LOW (ref >90)
GFR calc non Af Amer: 14 mL/min — ABNORMAL LOW (ref >90)
Glucose, Bld: 138 mg/dL — ABNORMAL HIGH (ref 70–99)
Potassium: 4.7 mEq/L (ref 3.5–5.1)
Sodium: 142 mEq/L (ref 135–145)

## 2013-07-31 LAB — MRSA PCR SCREENING: MRSA by PCR: NEGATIVE

## 2013-07-31 MED ORDER — ASPIRIN EC 81 MG PO TBEC
81.0000 mg | DELAYED_RELEASE_TABLET | Freq: Every morning | ORAL | Status: DC
  Administered 2013-08-01: 81 mg via ORAL
  Filled 2013-07-31: qty 1

## 2013-07-31 MED ORDER — ESCITALOPRAM OXALATE 10 MG PO TABS
10.0000 mg | ORAL_TABLET | Freq: Every day | ORAL | Status: DC
  Administered 2013-07-31 – 2013-08-02 (×3): 10 mg via ORAL
  Filled 2013-07-31 (×3): qty 1

## 2013-07-31 MED ORDER — PREDNISONE 5 MG PO TABS
5.0000 mg | ORAL_TABLET | Freq: Every day | ORAL | Status: DC
  Administered 2013-08-01 – 2013-08-02 (×2): 5 mg via ORAL
  Filled 2013-07-31 (×3): qty 1

## 2013-07-31 MED ORDER — TRAMADOL-ACETAMINOPHEN 37.5-325 MG PO TABS
1.0000 | ORAL_TABLET | Freq: Four times a day (QID) | ORAL | Status: DC | PRN
  Administered 2013-08-01 – 2013-08-02 (×2): 1 via ORAL
  Filled 2013-07-31 (×2): qty 1

## 2013-07-31 MED ORDER — TAMSULOSIN HCL 0.4 MG PO CAPS
0.4000 mg | ORAL_CAPSULE | Freq: Every day | ORAL | Status: DC
  Administered 2013-08-01 – 2013-08-08 (×4): 0.4 mg via ORAL
  Filled 2013-07-31 (×8): qty 1

## 2013-07-31 MED ORDER — ONDANSETRON HCL 4 MG/2ML IJ SOLN: 4.0000 mg | Freq: Four times a day (QID) | INTRAMUSCULAR | Status: DC | PRN

## 2013-07-31 MED ORDER — SODIUM CHLORIDE 0.9 % IV SOLN
1000.0000 mL | Freq: Once | INTRAVENOUS | Status: AC
  Administered 2013-07-31: 1000 mL via INTRAVENOUS

## 2013-07-31 MED ORDER — ALBUTEROL SULFATE (5 MG/ML) 0.5% IN NEBU
5.0000 mg | INHALATION_SOLUTION | Freq: Once | RESPIRATORY_TRACT | Status: AC
  Administered 2013-07-31: 5 mg via RESPIRATORY_TRACT
  Filled 2013-07-31: qty 1

## 2013-07-31 MED ORDER — GABAPENTIN 300 MG PO CAPS
300.0000 mg | ORAL_CAPSULE | Freq: Every day | ORAL | Status: DC
  Administered 2013-07-31 – 2013-08-01 (×2): 300 mg via ORAL
  Filled 2013-07-31 (×3): qty 1

## 2013-07-31 MED ORDER — IPRATROPIUM BROMIDE 0.02 % IN SOLN
0.5000 mg | Freq: Once | RESPIRATORY_TRACT | Status: AC
  Administered 2013-07-31: 0.5 mg via RESPIRATORY_TRACT
  Filled 2013-07-31: qty 2.5

## 2013-07-31 MED ORDER — SODIUM CHLORIDE 0.9 % IJ SOLN
3.0000 mL | Freq: Two times a day (BID) | INTRAMUSCULAR | Status: DC
  Administered 2013-07-31 – 2013-08-08 (×9): 3 mL via INTRAVENOUS

## 2013-07-31 MED ORDER — ENOXAPARIN SODIUM 30 MG/0.3ML ~~LOC~~ SOLN
30.0000 mg | SUBCUTANEOUS | Status: DC
  Filled 2013-07-31 (×2): qty 0.3

## 2013-07-31 MED ORDER — SODIUM CHLORIDE 0.9 % IV SOLN: INTRAVENOUS | Status: AC

## 2013-07-31 MED ORDER — ALBUTEROL SULFATE (5 MG/ML) 0.5% IN NEBU
INHALATION_SOLUTION | RESPIRATORY_TRACT | Status: AC
  Filled 2013-07-31: qty 1

## 2013-07-31 MED ORDER — ALBUTEROL SULFATE (5 MG/ML) 0.5% IN NEBU
2.5000 mg | INHALATION_SOLUTION | RESPIRATORY_TRACT | Status: DC | PRN
  Administered 2013-08-01 – 2013-08-02 (×3): 2.5 mg via RESPIRATORY_TRACT
  Filled 2013-07-31 (×3): qty 0.5

## 2013-07-31 MED ORDER — PANTOPRAZOLE SODIUM 40 MG PO TBEC
40.0000 mg | DELAYED_RELEASE_TABLET | Freq: Every day | ORAL | Status: DC
  Administered 2013-08-01: 40 mg via ORAL
  Filled 2013-07-31: qty 1

## 2013-07-31 MED ORDER — ENOXAPARIN SODIUM 40 MG/0.4ML ~~LOC~~ SOLN: 40.0000 mg | SUBCUTANEOUS | Status: DC

## 2013-07-31 MED ORDER — ONDANSETRON HCL 4 MG PO TABS: 4.0000 mg | ORAL_TABLET | Freq: Four times a day (QID) | ORAL | Status: DC | PRN

## 2013-07-31 MED ORDER — SODIUM CHLORIDE 0.9 % IV BOLUS (SEPSIS)
250.0000 mL | Freq: Once | INTRAVENOUS | Status: AC
  Administered 2013-07-31: 250 mL via INTRAVENOUS

## 2013-07-31 MED ORDER — VANCOMYCIN HCL IN DEXTROSE 1-5 GM/200ML-% IV SOLN
1000.0000 mg | INTRAVENOUS | Status: DC
  Administered 2013-07-31 – 2013-08-04 (×3): 1000 mg via INTRAVENOUS
  Filled 2013-07-31 (×2): qty 200

## 2013-07-31 MED ORDER — DEXTROSE 5 % IV SOLN
1.0000 g | INTRAVENOUS | Status: DC
  Administered 2013-07-31 – 2013-08-02 (×3): 1 g via INTRAVENOUS
  Filled 2013-07-31 (×3): qty 10

## 2013-07-31 MED ORDER — SODIUM CHLORIDE 0.9 % IV SOLN
1000.0000 mL | INTRAVENOUS | Status: DC
  Administered 2013-07-31 – 2013-08-02 (×2): 1000 mL via INTRAVENOUS

## 2013-07-31 MED ORDER — VANCOMYCIN HCL IN DEXTROSE 1-5 GM/200ML-% IV SOLN
1000.0000 mg | INTRAVENOUS | Status: DC
  Filled 2013-07-31: qty 200

## 2013-07-31 NOTE — Progress Notes (Signed)
Tama Gander NP notified of rectal bleeding .Plan to hold tonight dose of lovenox and continue to monitor patient.

## 2013-07-31 NOTE — ED Notes (Signed)
Dr Blake Divine called back notified patient temperature 95.7 rectally. States will keep patient on step down.

## 2013-07-31 NOTE — ED Notes (Signed)
Onset today after going to the bathroom developed shortness of breath on exertion and general fatigue.

## 2013-07-31 NOTE — ED Notes (Signed)
After confirming bed assignment Admit Doctor wanted rectal temperature paged admit Doctor Blake Divine [phone].

## 2013-07-31 NOTE — ED Notes (Signed)
Floor called at the same time patient was transported to Renal US.

## 2013-07-31 NOTE — ED Notes (Addendum)
Doctor notified of patient's temperature.  Applied additional warm blankets, warm fluids and bair hugger (warm blanket currently all bair huggers in use). Ordered warming blanket by ED tech from portable equipment.

## 2013-07-31 NOTE — ED Notes (Signed)
Admit Doctor at bedside.  

## 2013-07-31 NOTE — Progress Notes (Signed)
Patient rectal temperature 96.1 .Patient placed back on warming blanket.

## 2013-07-31 NOTE — ED Notes (Addendum)
Phlebotomy at bedside.

## 2013-07-31 NOTE — ED Notes (Signed)
EKG completed and given to EDP.  

## 2013-07-31 NOTE — Progress Notes (Signed)
ANTIBIOTIC CONSULT NOTE - INITIAL  Pharmacy Consult for vancomycin  Indication: UTI  Allergies  Allergen Reactions  . Codeine Other (See Comments)     drives me crazy  . Hydrocodone Other (See Comments)    Makes patient hallucinate, cannot sleep  . Oxycodone Other (See Comments)    Makes patient hallucinate, cannot sleep  . Penicillins Hives  . Sulfa Antibiotics Other (See Comments)    Per MAR    Patient Measurements:   Adjusted Body Weight:   Vital Signs: Temp: 95.1 F (35.1 C) (11/03 1645) Temp src: Rectal (11/03 1645) BP: 117/55 mmHg (11/03 1645) Pulse Rate: 56 (11/03 1645) Intake/Output from previous day:   Intake/Output from this shift: Total I/O In: -  Out: 200 [Urine:200]  Labs:  Recent Labs  07/31/13 1110  WBC 9.3  HGB 10.0*  PLT 172  CREATININE 3.73*   The CrCl is unknown because both a height and weight (above a minimum accepted value) are required for this calculation. No results found for this basename: VANCOTROUGH, VANCOPEAK, VANCORANDOM, GENTTROUGH, GENTPEAK, GENTRANDOM, TOBRATROUGH, TOBRAPEAK, TOBRARND, AMIKACINPEAK, AMIKACINTROU, AMIKACIN,  in the last 72 hours   Microbiology: No results found for this or any previous visit (from the past 720 hour(s)).  Medical History: Past Medical History  Diagnosis Date  . Hypertension   . Arthritis   . Ex-smoker   . Rheumatoid arthritis(714.0)   . Pulmonary nodule   . DDD (degenerative disc disease)   . Complication of anesthesia     " I am difficult to wake up "  . Chronic kidney disease     acute on chronic  . Anemia     Medications:  Anti-infectives   Start     Dose/Rate Route Frequency Ordered Stop   07/31/13 1715  vancomycin (VANCOCIN) IVPB 1000 mg/200 mL premix     1,000 mg 200 mL/hr over 60 Minutes Intravenous Every 24 hours 07/31/13 1700     07/31/13 1700  cefTRIAXone (ROCEPHIN) 1 g in dextrose 5 % 50 mL IVPB     1 g 100 mL/hr over 30 Minutes Intravenous Every 24 hours 07/31/13  1655       Assessment: 85 yom presented to the ED with SOB. Noted history of CKD and Scr is elevated above baseline at 3.73. To start empiric vancomycin + ceftriaxone (PCN allergy but has tolerated cephalosporins in the past) for possible UTI.  Pt is slightly hypothermic and WBC is WNL. Lactic acid is normal.   Goal of Therapy:  Vancomycin trough level 10-15 mcg/ml  Plan:  1. Vanc 1gm IV Q48H 2. F/u renal fxn, C&S, clinical status and trough at [facility]  Divon Krabill, Drake Leach 07/31/2013,5:00 PM

## 2013-07-31 NOTE — ED Provider Notes (Signed)
CSN: [id number]     Arrival date & time 07/31/13  1031 History   First MD Initiated Contact with Patient 07/31/13 1032     Chief Complaint  Patient presents with  . Shortness of Breath    HPI Patient presents with worsening shortness of breath and productive cough over the past several days.  He states he normally takes inhalers at home however he had run out.  EMS gave him a breathing treatment in route and the patient states he feels much better at this time.  He denies fevers and chills.  Family states she's been eating and drinking normally.  No reported diarrhea.  No altered mental status.  Patient found to have O2 sats of 87% on room air on arrival to the emergency department.  Patient no longer smokes cigarettes.    Past Medical History  Diagnosis Date  . Hypertension   . Arthritis   . Ex-smoker   . Rheumatoid arthritis(714.0)   . Pulmonary nodule   . DDD (degenerative disc disease)   . Complication of anesthesia     " I am difficult to wake up "  . Chronic kidney disease     acute on chronic  . Anemia    Past Surgical History  Procedure Laterality Date  . Joint replacement      bilateral knee replacement  . Hernia repair    . Back surgery      X 2  . Appendectomy     Family History  Problem Relation Age of Onset  . Coronary artery disease Brother   . Hypertension Father   . Hypertension Mother    History  Substance Use Topics  . Smoking status: Former Smoker -- 0.50 packs/day for 30 years    Types: Cigarettes    Quit date: 10/12/1973  . Smokeless tobacco: Never Used  . Alcohol Use: No    Review of Systems  All other systems reviewed and are negative.    Allergies  Codeine; Hydrocodone; Oxycodone; Penicillins; and Sulfa antibiotics  Home Medications   Current Outpatient Rx  Name  Route  Sig  Dispense  Refill  . albuterol (PROVENTIL) (2.5 MG/3ML) 0.083% nebulizer solution   Nebulization   Take 2.5 mg by nebulization every 4 (four) hours as needed.  For shortness of breath         . alendronate (FOSAMAX) 70 MG tablet   Oral   Take 70 mg by mouth every 7 (seven) days. Take with a full glass of water on an empty stomach.  On Friday         . aspirin EC 81 MG tablet   Oral   Take 81 mg by mouth every morning.          . Cholecalciferol (VITAMIN D) 2000 UNITS tablet   Oral   Take 2,000 Units by mouth every morning.          . escitalopram (LEXAPRO) 10 MG tablet   Oral   Take 10 mg by mouth daily.         Marland Kitchen gabapentin (NEURONTIN) 300 MG capsule   Oral   Take 300 mg by mouth at bedtime.         . metoprolol tartrate (LOPRESSOR) 25 MG tablet   Oral   Take 2 tablets (50 mg total) by mouth 2 (two) times daily.         Marland Kitchen omeprazole (PRILOSEC) 20 MG capsule   Oral   Take 20 mg by mouth  daily.          . predniSONE (DELTASONE) 5 MG tablet   Oral   Take 5 mg by mouth daily.         . Probiotic Product (ALIGN) 4 MG CAPS   Oral   Take 1 capsule by mouth every morning.         Kristin Bruins B COMPLEX/C CAPS   Oral   Take 1 capsule by mouth daily.         . Tamsulosin HCl (FLOMAX) 0.4 MG CAPS   Oral   Take 1 capsule (0.4 mg total) by mouth daily after supper.         . traMADol-acetaminophen (ULTRACET) 37.5-325 MG per tablet   Oral   Take 1 tablet by mouth every 6 (six) hours as needed for pain.         Marland Kitchen trimethoprim (TRIMPEX) 100 MG tablet   Oral   Take 100 mg by mouth daily.          BP 106/57  Pulse 53  Temp(Src) 94 F (34.4 C) (Rectal)  Resp 16  SpO2 98% Physical Exam  Nursing note and vitals reviewed. Constitutional: He is oriented to person, place, and time. He appears well-developed and well-nourished.  HENT:  Head: Normocephalic and atraumatic.  Eyes: EOM are normal.  Neck: Normal range of motion.  Cardiovascular: Normal rate, regular rhythm, normal heart sounds and intact distal pulses.   Pulmonary/Chest: Effort normal. No respiratory distress. He has wheezes.  Abdominal: Soft.  He exhibits no distension. There is no tenderness.  Musculoskeletal: Normal range of motion.  Neurological: He is alert and oriented to person, place, and time.  Skin: Skin is warm and dry.  Psychiatric: He has a normal mood and affect. Judgment normal.    ED Course  Procedures (including critical care time) Labs Review Labs Reviewed  CBC - Abnormal; Notable for the following:    RBC 3.39 (*)    Hemoglobin 10.0 (*)    HCT 31.4 (*)    RDW 17.7 (*)    All other components within normal limits  BASIC METABOLIC PANEL - Abnormal; Notable for the following:    Chloride 114 (*)    CO2 14 (*)    Glucose, Bld 138 (*)    BUN 48 (*)    Creatinine, Ser 3.73 (*)    GFR calc non Af Amer 14 (*)    GFR calc Af Amer 16 (*)    All other components within normal limits  CULTURE, BLOOD (ROUTINE X 2)  CULTURE, BLOOD (ROUTINE X 2)  URINE CULTURE  URINALYSIS, ROUTINE W REFLEX MICROSCOPIC   Imaging Review Dg Chest 2 View  07/31/2013   CLINICAL DATA:  Cough. Shortness of breath. History of smoking and hypertension.  EXAM: CHEST  2 VIEW  COMPARISON:  05/02/2013  FINDINGS: The patient has had median sternotomy. The heart is enlarged. Shallow lung inflation. Prominent lung markings are again noted. The patient has been shown to have multiple pulmonary nodules on previous CT exams. There are no focal consolidations.  IMPRESSION: 1. Cardiomegaly without pulmonary edema. 2. Shallow lung inflation. 3. No new consolidations. 4. Known multiple pulmonary nodules.   Electronically Signed   By: Rosalie Gums M.D.   On: 07/31/2013 11:51    EKG Interpretation     Ventricular Rate:  64 PR Interval:  179 QRS Duration: 116 QT Interval:  488 QTC Calculation: 504 R Axis:   -46 Text Interpretation:  Sinus bradycardia  Atrial premature complex Left anterior fascicular block Consider anterior infarct No significant change was found            MDM   1. Renal failure   2. Bronchitis    Patient symptoms  consistent with bronchitis/reactive airway disease.  Feels better after albuterol.  Patient be placed on steroids.  He does appear to have new acute renal failure with a BUN of 48 creatinine 2.7.  His baseline appears to be a creatinine in the low twos.  He is acidotic with a bicarbonate of 14.  He was also found to be hypothermic.  Urinalysis pending at this time.  Chest x-ray shows no evidence of pneumonia.  Patient will receive blood cultures and warming measures.  Rectal temp 94.  Given his acidosis a lactate is pending.  Currently he is not hypotensive or tachycardic.  For catheter was placed as it was reported that the patient has had minimal urinary output over the past several days however for catheter demonstrates no evidence of urinary retention.  Catheter will be removed at this time.  Concerning for anuria.  Fluid bolus given in the ER.  Potassium is normal    Lyanne Co, MD 07/31/13 1436

## 2013-07-31 NOTE — ED Notes (Signed)
Notified phlebotomy of additional blood draw and was unable to start another IV.

## 2013-07-31 NOTE — ED Notes (Signed)
Attempted second IV unable to paged IV team.

## 2013-07-31 NOTE — Progress Notes (Signed)
Turning patient over in bed to assess skin noticed bed pad and gown with bright red blood and clots.Asked patient if he's had previous bleeding from rectum and denied rectal bleeding. Will continue to monitor.

## 2013-07-31 NOTE — ED Notes (Signed)
Daughter arrived stated patient has history of multiple Uti's EDP notified.

## 2013-07-31 NOTE — H&P (Addendum)
[facility] History and Physical  [name] ZHY:[id number] DOB: November 27, 1927 DOA: 07/31/2013  Referring physician: Dr [name] PCP: Jon Patch, MD  Specialists: Dr [name] derm  Chief Complaint: brought in by his daughter.  HPI: [name] is a 79 y.o. male with prior h/o hypertension, DDD, RA, mulitple UTI'S in the past was brought in by his daughter for his sob, and productive cough, chills. He was given breathing treatment by EMS and on arrival to ED he was found to be hypoxic. He was also found to be in acute on chronic renal failure, hypothermic and have a UTI. Pt is a poor historian and most of the history is available from the pt 's daughter.  Pt is being admitted to step down overnight.  Review of Systems: could not be obtained.   Past Medical History  Diagnosis Date  . Hypertension   . Arthritis   . Ex-smoker   . Rheumatoid arthritis(714.0)   . Pulmonary nodule   . DDD (degenerative disc disease)   . Complication of anesthesia     " I am difficult to wake up "  . Chronic kidney disease     acute on chronic  . Anemia    Past Surgical History  Procedure Laterality Date  . Joint replacement      bilateral knee replacement  . Hernia repair    . Back surgery      X 2  . Appendectomy     Social History:  reports that he quit smoking about 39 years ago. His smoking use included Cigarettes. He has a 15 pack-year smoking history. He has never used smokeless tobacco. He reports that he does not drink alcohol or use illicit drugs.  where does patient live--home,  Allergies  Allergen Reactions  . Codeine Other (See Comments)     drives me crazy  . Hydrocodone Other (See Comments)    Makes patient hallucinate, cannot sleep  . Oxycodone Other (See Comments)    Makes patient hallucinate, cannot sleep  . Penicillins Hives  . Sulfa Antibiotics Other (See Comments)    Per MAR    Family History  Problem Relation Age of Onset  . Coronary artery disease Brother   .  Hypertension Father   . Hypertension Mother      Prior to Admission medications   Medication Sig Start Date End Date Taking? Authorizing Provider  albuterol (PROVENTIL) (2.5 MG/3ML) 0.083% nebulizer solution Take 2.5 mg by nebulization every 4 (four) hours as needed. For shortness of breath   Yes Historical Provider, MD  alendronate (FOSAMAX) 70 MG tablet Take 70 mg by mouth every 7 (seven) days. Take with a full glass of water on an empty stomach.  On Friday   Yes Historical Provider, MD  aspirin EC 81 MG tablet Take 81 mg by mouth every morning.    Yes Historical Provider, MD  Cholecalciferol (VITAMIN D) 2000 UNITS tablet Take 2,000 Units by mouth every morning.    Yes Historical Provider, MD  escitalopram (LEXAPRO) 10 MG tablet Take 10 mg by mouth daily.   Yes Historical Provider, MD  gabapentin (NEURONTIN) 300 MG capsule Take 300 mg by mouth at bedtime.   Yes Historical Provider, MD  metoprolol tartrate (LOPRESSOR) 25 MG tablet Take 2 tablets (50 mg total) by mouth 2 (two) times daily. 05/05/13  Yes Jon Etienne, MD  omeprazole (PRILOSEC) 20 MG capsule Take 20 mg by mouth daily.    Yes Historical Provider, MD  predniSONE (DELTASONE)  5 MG tablet Take 5 mg by mouth daily.   Yes Historical Provider, MD  Probiotic Product (ALIGN) 4 MG CAPS Take 1 capsule by mouth every morning.   Yes Historical Provider, MD  SUPER B COMPLEX/C CAPS Take 1 capsule by mouth daily.   Yes Historical Provider, MD  Tamsulosin HCl (FLOMAX) 0.4 MG CAPS Take 1 capsule (0.4 mg total) by mouth daily after supper. 06/13/12  Yes Jon Loll, MD  traMADol-acetaminophen (ULTRACET) 37.5-325 MG per tablet Take 1 tablet by mouth every 6 (six) hours as needed for pain. 05/05/13  Yes Jon Etienne, MD  trimethoprim (TRIMPEX) 100 MG tablet Take 100 mg by mouth daily.   Yes Historical Provider, MD   Physical Exam: Filed Vitals:   07/31/13 1908  BP: 124/94  Pulse: 70  Temp:   Resp: 14    Constitutional: Vital signs  reviewed.  Patient is a well-developed and well-nourished  in mild acute distress and cooperative with exam. Alert and oriented to person and place.   Head: Normocephalic and atraumatic Mouth: no erythema or exudates, MMM Eyes: PERRL, EOMI, conjunctivae normal, No scleral icterus.  Neck: Supple, Trachea midline normal ROM, No JVD, mass, thyromegaly, or carotid bruit present.  Cardiovascular: slightly bradycardic, RR, S1 normal, S2 normal, no MRG, pulses symmetric and intact bilaterally Pulmonary/Chest: normal respiratory effort, CTAB, no wheezes, rales, or rhonchi Abdominal: Soft. Non-tender, non-distended, bowel sounds are normal, no masses, . Musculoskeletal: 1+ pedal edema.  Neurological: A&O to person and place.  Able to move all extremities. Speech normal.no focal deficits.  Skin: Warm, dry and intact. No rash, cyanosis, or clubbing.      Labs on Admission:  Basic Metabolic Panel:  Recent Labs Lab 07/31/13 1110  NA 142  K 4.7  CL 114*  CO2 14*  GLUCOSE 138*  BUN 48*  CREATININE 3.73*  CALCIUM 9.7   Liver Function Tests: No results found for this basename: AST, ALT, ALKPHOS, BILITOT, PROT, ALBUMIN,  in the last 168 hours No results found for this basename: LIPASE, AMYLASE,  in the last 168 hours No results found for this basename: AMMONIA,  in the last 168 hours CBC:  Recent Labs Lab 07/31/13 1110  WBC 9.3  HGB 10.0*  HCT 31.4*  MCV 92.6  PLT 172   Cardiac Enzymes: No results found for this basename: CKTOTAL, CKMB, CKMBINDEX, TROPONINI,  in the last 168 hours  BNP (last 3 results)  Recent Labs  01/03/13 0552 04/27/13 1455  PROBNP 602.9* 3460.0*   CBG: No results found for this basename: GLUCAP,  in the last 168 hours  Radiological Exams on Admission: Dg Chest 2 View  07/31/2013   CLINICAL DATA:  Cough. Shortness of breath. History of smoking and hypertension.  EXAM: CHEST  2 VIEW  COMPARISON:  05/02/2013  FINDINGS: The patient has had median  sternotomy. The heart is enlarged. Shallow lung inflation. Prominent lung markings are again noted. The patient has been shown to have multiple pulmonary nodules on previous CT exams. There are no focal consolidations.  IMPRESSION: 1. Cardiomegaly without pulmonary edema. 2. Shallow lung inflation. 3. No new consolidations. 4. Known multiple pulmonary nodules.   Electronically Signed   By: Rosalie Gums M.D.   On: 07/31/2013 11:51   US Renal  07/31/2013   CLINICAL DATA:  Hydronephrosis  EXAM: RENAL/URINARY TRACT ULTRASOUND COMPLETE  COMPARISON:  CT scan 04/29/2013.  FINDINGS: Right Kidney  Length: 10.8 cm. Normal echogenicity and normal renal cortical thickness. No focal lesions. Moderate  to advanced hydronephrosis.  Left Kidney  Length: 10.8 cm. Normal echogenicity and normal renal cortical thickness. Moderate hydronephrosis. No definite renal calculi.  Bladder  Decompressed by a Foley catheter.  IMPRESSION: Bilateral hydronephrosis, right greater than left.   Electronically Signed   By: Loralie Champagne M.D.   On: 07/31/2013 18:48    EKG: sinus bradycardia  Assessment/Plan Active Problems: 1. Hypothermia, Sepsis from UTI: - admit  To step down overnight. - obtained blood cultures and urine cultures. His Urine cultures in the past revealed Enterococcus resistant to ampicillin, but sensitive to vancomycin. Will order vancomycin and rocephin.  - he is on warming blanket at this time and his hypothermia is improving.  - if he is improving, he can be transferred to tele  In am. - started him on IV vancomycin and rocephin.  - tsh ordered.   2. Hypertension; BP soft and borderline. Hold home anti hypertensives for now.   3. UTI: see #1.  4. COPD; no wheezing heard. Resume bronchodilators.   5. Acute renal failure: probably a combination of pre renal and infection.  - gentle hydration.  - US RENAL ordered.  - repeat labs in am.   6. DVT prophylaxis.     Code Status: full code confirmed with  daughter. Family Communication: family at bedside Disposition Plan: admit to step down.  Time spent: 75 min  Radiah Lubinski [facility] Pager [phone]  If 7PM-7AM, please contact night-coverage [email or web address] Password [facility] 07/31/2013, 7:15 PM

## 2013-08-01 DIAGNOSIS — N179 Acute kidney failure, unspecified: Secondary | ICD-10-CM | POA: Diagnosis present

## 2013-08-01 DIAGNOSIS — D649 Anemia, unspecified: Secondary | ICD-10-CM

## 2013-08-01 DIAGNOSIS — D62 Acute posthemorrhagic anemia: Secondary | ICD-10-CM | POA: Diagnosis present

## 2013-08-01 DIAGNOSIS — K921 Melena: Secondary | ICD-10-CM | POA: Diagnosis present

## 2013-08-01 DIAGNOSIS — T68XXXA Hypothermia, initial encounter: Secondary | ICD-10-CM

## 2013-08-01 LAB — CBC
Hemoglobin: 10.8 g/dL — ABNORMAL LOW (ref 13.0–17.0)
MCH: 30.1 pg (ref 26.0–34.0)
MCHC: 32.9 g/dL (ref 30.0–36.0)
MCHC: 33 g/dL (ref 30.0–36.0)
MCV: 91.5 fL (ref 78.0–100.0)
MCV: 90.3 fL (ref 78.0–100.0)
Platelets: 162 10*3/uL (ref 150–400)
Platelets: 162 10*3/uL (ref 150–400)
RDW: 18.1 % — ABNORMAL HIGH (ref 11.5–15.5)
RDW: 17.7 % — ABNORMAL HIGH (ref 11.5–15.5)
WBC: 7.2 10*3/uL (ref 4.0–10.5)
WBC: 8.7 10*3/uL (ref 4.0–10.5)

## 2013-08-01 LAB — BASIC METABOLIC PANEL
CO2: 18 mEq/L — ABNORMAL LOW (ref 19–32)
Calcium: 8.8 mg/dL (ref 8.4–10.5)
Creatinine, Ser: 3.71 mg/dL — ABNORMAL HIGH (ref 0.50–1.35)
GFR calc non Af Amer: 14 mL/min — ABNORMAL LOW (ref >90)
Glucose, Bld: 119 mg/dL — ABNORMAL HIGH (ref 70–99)

## 2013-08-01 LAB — PREPARE RBC (CROSSMATCH)

## 2013-08-01 LAB — PROCALCITONIN: Procalcitonin: <0.1 ng/mL

## 2013-08-01 LAB — GLUCOSE, CAPILLARY: Glucose-Capillary: 101 mg/dL — ABNORMAL HIGH (ref 70–99)

## 2013-08-01 MED ORDER — BOOST / RESOURCE BREEZE PO LIQD
1.0000 | Freq: Three times a day (TID) | ORAL | Status: DC
  Administered 2013-08-01 – 2013-08-02 (×2): 1 via ORAL

## 2013-08-01 NOTE — Progress Notes (Signed)
Utilization Review Completed.  

## 2013-08-01 NOTE — Progress Notes (Signed)
INITIAL NUTRITION ASSESSMENT  DOCUMENTATION CODES Per approved criteria  -Not Applicable   INTERVENTION:  Resource Breeze 3 times daily (250 kcals, 9 gm protein per 8 fl oz carton) RD to follow for nutrition care plan  NUTRITION DIAGNOSIS: Inadequate oral intake related to poor appetite as evidenced by PO intake 0%  Goal: Pt to meet >/= 90% of their estimated nutrition needs   Monitor:  PO & supplemental intake, weight, labs, I/O's  Reason for Assessment: Consult, Malnutrition Screening Tool Report  79 y.o. male  Admitting Dx: UTI, hypoxia   ASSESSMENT: Patient with PMH of HTN, DDD, RA, mulitple UTI's brought in by his daughter for his SOB, productive cough and chills -- hypoxic upon arrival to ED; found to have acute on chronic renal failure, hypothermia and UTI.   Patient sleeping upon RD visit -- unable to wake -- breakfast tray untouched on tray table; patient with hx of poor PO intake during hospital admission (July 2014) -- patient reported at that time does not like Ensure, however, willing to try [employer] supplement -- RD to order.  Height: Ht Readings from Last 1 Encounters:  07/31/13 5\' 5"  (1.651 m)    Weight: Wt Readings from Last 1 Encounters:  07/31/13 172 lb 2.9 oz (78.1 kg)    Ideal Body Weight: 136 lb  % Ideal Body Weight: 126%  Wt Readings from Last 10 Encounters:  07/31/13 172 lb 2.9 oz (78.1 kg)  05/04/13 159 lb 4.8 oz (72.258 kg)  03/03/13 172 lb 12.8 oz (78.382 kg)  03/01/13 161 lb 1.6 oz (73.074 kg)  02/06/13 168 lb 6.4 oz (76.386 kg)  01/06/13 175 lb 1.6 oz (79.425 kg)  06/04/12 176 lb (79.833 kg)  10/13/11 163 lb 2.3 oz (74 kg)  05/15/09 176 lb 4 oz (79.946 kg)    Usual Body Weight: 161 lb  % Usual Body Weight: 106%  BMI:  Body mass index is 28.65 kg/(m^2).  Estimated Nutritional Needs: Kcal: 1700-1900 Protein: 80-90 gm Fluid: 1.7-1.9 L  Skin: Intact  Diet Order: Clear Liquid  EDUCATION NEEDS: -No education needs  identified at this time   Intake/Output Summary (Last 24 hours) at 08/01/13 1137 Last data filed at 08/01/13 1000  Gross per 24 hour  Intake 3227.09 ml  Output   1200 ml  Net 2027.09 ml    Labs:   Recent Labs Lab 07/31/13 1110 08/01/13 0355  NA 142 143  K 4.7 5.5*  CL 114* 116*  CO2 14* 18*  BUN 48* 49*  CREATININE 3.73* 3.71*  CALCIUM 9.7 8.8  GLUCOSE 138* 119*    CBG (last 3)   Recent Labs  08/01/13 0723  GLUCAP 101*    Scheduled Meds: . sodium chloride   Intravenous STAT  . aspirin EC  81 mg Oral q morning - 10a  . cefTRIAXone (ROCEPHIN)  IV  1 g Intravenous Q24H  . enoxaparin (LOVENOX) injection  30 mg Subcutaneous Q24H  . escitalopram  10 mg Oral Daily  . gabapentin  300 mg Oral QHS  . pantoprazole  40 mg Oral Daily  . predniSONE  5 mg Oral Q breakfast  . sodium chloride  3 mL Intravenous Q12H  . tamsulosin  0.4 mg Oral QPC supper  . vancomycin  1,000 mg Intravenous Q48H    Continuous Infusions: . sodium chloride 1,000 mL (08/01/13 0700)    Past Medical History  Diagnosis Date  . Hypertension   . Arthritis   . Ex-smoker   . Rheumatoid  arthritis(714.0)   . Pulmonary nodule   . DDD (degenerative disc disease)   . Complication of anesthesia     " I am difficult to wake up "  . Chronic kidney disease     acute on chronic  . Anemia     Past Surgical History  Procedure Laterality Date  . Joint replacement      bilateral knee replacement  . Hernia repair    . Back surgery      X 2  . Appendectomy      Maureen Chatters, RD, LDN Pager #: [phone] After-Hours Pager #: [phone]

## 2013-08-01 NOTE — Plan of Care (Cosign Needed)
Labs/VS reviewed. Hgb has dropped to 7.8 from >9.0 after episode of BRBPR with clots overnight, BP also soft and variable so will transfuse 2 units of PRBCs- since also hypothermic will use blood warmer. Presented with infectious symptoms so will check PCT and cortisol as well.   853 am- temp back up so will dc blood warmer  Junious Silk, ANP

## 2013-08-01 NOTE — Progress Notes (Signed)
[facility] Progress Note [place] TEAM 1 - Stepdown ICU Team   Jon BARRETTA ZOX:[id number] DOB: 01/21/1928 DOA: 07/31/2013 PCP: [name], MD  Brief narrative: 79 y.o. male with prior h/o hypertension, DDD, RA, mulitple UTI'S in the past. Brought to ER by his daughter for reported SOB, productive cough and chills. He was given a breathing treatment by EMS and on arrival to ED he was found to be hypoxic. He was also found to be in acute on chronic renal failure, hypothermic and have a UTI. Pt was a poor historian and most of the history was obtained from the pt 's daughter.  After arrival to the SDU pt had episode of hematochezia with clots. Lovenox for DVT prophylaxis was stopped.   Assessment/Plan: Active Problems:   UTI (urinary tract infection) -cont empiric anbx's -FU on cx's    Hematochezia -onset after admission -no recurrence so far -Lovenox dc'd - d/c ASA for now -will eventually need GI eval once more stable    Acute blood loss anemia -BP soft so transfuse PRBCs -FU CBC     Hypothermia  -? From low perfusion or sepsis -FU cortisol -PCT not helpful     Acute renal failure/ CKD (chronic kidney disease) stage 3, GFR 30-59 ml/min -hydrate and avoid nephrotoxic meds -follow lytes    Obstructive uropathy/chronic bilateral hydronephrosis -stable since last admit -cont foley -prior US ? Bladder irregular wall thickening 8/1- no mention of same on current [place]    HYPERTENSION    Rheumatoid arthritis    COPD (chronic obstructive pulmonary disease) -not wheezing   DVT prophylaxis: SCDs Code Status: FULL Family Communication: No family at bedside Disposition Plan/Expected LOS: Stepdown Isolation: None Nutritional Status: Acute protein calorie malnutrition  Consultants: None  Procedures: None  Antibiotics: Rocephin 11/3 >>> Vancomycin 11/3 >>>  HPI/Subjective: Patient awake but lethargic. Currently denying cough. Was not aware had been  having hematochezia. No chest pain. No current shortness of breath.  Objective: Blood pressure 107/59, pulse 79, temperature 98.4 F (36.9 C), temperature source Rectal, resp. rate 20, height 5\' 5"  (1.651 m), weight 78.1 kg (172 lb 2.9 oz), SpO2 96.00%.  Intake/Output Summary (Last 24 hours) at 08/01/13 1131 Last data filed at 08/01/13 1000  Gross per 24 hour  Intake 3227.09 ml  Output   1200 ml  Net 2027.09 ml     Exam: General: No acute respiratory distress-intermittent tachypnea Lungs: Clear to auscultation bilaterally without wheezes or crackles,  2 L Cardiovascular: Regular rate and rhythm without murmur gallop or rub normal S1 and S2, no peripheral edema or JVD-BP soft but improving with hydration Abdomen: Nontender, nondistended, soft, bowel sounds positive, no rebound, no ascites, no appreciable mass Musculoskeletal: No significant cyanosis, clubbing of bilateral lower extremities Neurological: Awakens and oriented x name, weakly moves all extremities x 4 without focal neurological deficits  Scheduled Meds:  Scheduled Meds: . sodium chloride   Intravenous STAT  . aspirin EC  81 mg Oral q morning - 10a  . cefTRIAXone (ROCEPHIN)  IV  1 g Intravenous Q24H  . enoxaparin (LOVENOX) injection  30 mg Subcutaneous Q24H  . escitalopram  10 mg Oral Daily  . gabapentin  300 mg Oral QHS  . pantoprazole  40 mg Oral Daily  . predniSONE  5 mg Oral Q breakfast  . sodium chloride  3 mL Intravenous Q12H  . tamsulosin  0.4 mg Oral QPC supper  . vancomycin  1,000 mg Intravenous Q48H   Continuous Infusions: . sodium chloride  1,000 mL (08/01/13 0700)    **Reviewed in detail by the Attending Physician  Data Reviewed: Basic Metabolic Panel:  Recent Labs Lab 07/31/13 1110 08/01/13 0355  NA 142 143  K 4.7 5.5*  CL 114* 116*  CO2 14* 18*  GLUCOSE 138* 119*  BUN 48* 49*  CREATININE 3.73* 3.71*  CALCIUM 9.7 8.8   Liver Function Tests: No results found for this basename: AST,  ALT, ALKPHOS, BILITOT, PROT, ALBUMIN,  in the last 168 hours No results found for this basename: LIPASE, AMYLASE,  in the last 168 hours No results found for this basename: AMMONIA,  in the last 168 hours CBC:  Recent Labs Lab 07/31/13 1110 08/01/13 0355  WBC 9.3 7.2  HGB 10.0* 7.8*  HCT 31.4* 23.7*  MCV 92.6 91.5  PLT 172 162   Cardiac Enzymes: No results found for this basename: CKTOTAL, CKMB, CKMBINDEX, TROPONINI,  in the last 168 hours BNP (last 3 results)  Recent Labs  01/03/13 0552 04/27/13 1455  PROBNP 602.9* 3460.0*   CBG:  Recent Labs Lab 08/01/13 0723  GLUCAP 101*    Recent Results (from the past 240 hour(s))  CULTURE, BLOOD (ROUTINE X 2)     Status: None   Collection Time    07/31/13  1:57 PM      Result Value Range Status   Specimen Description BLOOD LEFT HAND   Final   Special Requests BOTTLES DRAWN AEROBIC ONLY   Final   Culture  Setup Time     Final   Value: 07/31/2013 22:47     Performed at [facility]   Culture     Final   Value:        BLOOD CULTURE RECEIVED NO GROWTH TO DATE CULTURE WILL BE HELD FOR 5 DAYS BEFORE ISSUING A FINAL NEGATIVE REPORT     Performed at [facility]   Report Status PENDING   Incomplete  CULTURE, BLOOD (ROUTINE X 2)     Status: None   Collection Time    07/31/13  3:51 PM      Result Value Range Status   Specimen Description BLOOD LEFT ANTECUBITAL   Final   Special Requests BOTTLES DRAWN AEROBIC ONLY   Final   Culture  Setup Time     Final   Value: 07/31/2013 22:46     Performed at [facility]   Culture     Final   Value:        BLOOD CULTURE RECEIVED NO GROWTH TO DATE CULTURE WILL BE HELD FOR 5 DAYS BEFORE ISSUING A FINAL NEGATIVE REPORT     Performed at [facility]   Report Status PENDING   Incomplete  MRSA PCR SCREENING     Status: None   Collection Time    07/31/13  7:16 PM      Result Value Range Status   MRSA by PCR NEGATIVE  NEGATIVE Final   Comment:             The GeneXpert MRSA Assay (FDA     approved for NASAL specimens     only), is one component of a     comprehensive MRSA colonization     surveillance program. It is not     intended to diagnose MRSA     infection nor to guide or     monitor treatment for     MRSA infections.     Studies:  Recent x-ray studies have been  reviewed in detail by the Attending Physician     Junious Silk, ANP [facility] Office  [phone] Pager [phone]  **If unable to reach the above provider after paging please contact the Flow Manager @ [phone]  On-Call/Text Page:      [email or web address]      password [email or web address]  If 7PM-7AM, please contact night-coverage [email or web address] Password [facility] 08/01/2013, 11:31 AM   LOS: 1 day   I have examined the patient, reviewed the chart and modified the above note which I agree with.   Rynell Ciotti,MD [phone] 08/01/2013, 1:45 PM

## 2013-08-01 NOTE — Progress Notes (Signed)
PT Cancellation Note  Patient Details Name: [name] MRN: [id number] DOB: 03/26/1928   Cancelled Treatment:    Reason Eval/Treat Not Completed: Medical issues which prohibited therapy.  Spoke with nursing as per chart review pt did not seem ready for PT evaluation.  Nursing concurred that pt not appropriate for therapies jjust yet.  She recommended PT sign off and ask for reorder when pt was more medically stable.  Will await new orders.  MD: Please reorder as appropriate.  Thanks.     [name],[name] 08/01/2013, 10:35 AM  [facility] [phone] (pager)

## 2013-08-02 ENCOUNTER — Inpatient Hospital Stay (HOSPITAL_COMMUNITY): Payer: Medicare Other

## 2013-08-02 ENCOUNTER — Encounter (HOSPITAL_COMMUNITY): Payer: Self-pay | Admitting: Physician Assistant

## 2013-08-02 DIAGNOSIS — J9600 Acute respiratory failure, unspecified whether with hypoxia or hypercapnia: Secondary | ICD-10-CM

## 2013-08-02 DIAGNOSIS — G9340 Encephalopathy, unspecified: Secondary | ICD-10-CM

## 2013-08-02 DIAGNOSIS — K921 Melena: Secondary | ICD-10-CM

## 2013-08-02 LAB — URINE CULTURE: Colony Count: 5000

## 2013-08-02 LAB — BLOOD GAS, ARTERIAL
Bicarbonate: 18.6 mEq/L — ABNORMAL LOW (ref 20.0–24.0)
Bicarbonate: 21.7 mEq/L (ref 20.0–24.0)
Drawn by: 24610
Drawn by: 10006
FIO2: 1 %
MECHVT: 450 mL
O2 Content: 2 L/min
O2 Saturation: 99.5 %
PEEP: 5 cmH2O
Patient temperature: 98.1
Patient temperature: 98.6
RATE: 18 resp/min
TCO2: 20.5 mmol/L (ref 0–100)
pCO2 arterial: 61.4 mmHg (ref 35.0–45.0)
pCO2 arterial: 51.2 mmHg — ABNORMAL HIGH (ref 35.0–45.0)
pH, Arterial: 7.108 — CL (ref 7.350–7.450)
pH, Arterial: 7.251 — ABNORMAL LOW (ref 7.350–7.450)
pO2, Arterial: 119 mmHg — ABNORMAL HIGH (ref 80.0–100.0)
pO2, Arterial: 444 mmHg — ABNORMAL HIGH (ref 80.0–100.0)

## 2013-08-02 LAB — LACTIC ACID, PLASMA: Lactic Acid, Venous: 0.8 mmol/L (ref 0.5–2.2)

## 2013-08-02 LAB — TYPE AND SCREEN
ABO/RH(D): B POS
Antibody Screen: NEGATIVE
Unit division: 0

## 2013-08-02 LAB — BASIC METABOLIC PANEL
BUN: 50 mg/dL — ABNORMAL HIGH (ref 6–23)
Chloride: 119 mEq/L — ABNORMAL HIGH (ref 96–112)
Creatinine, Ser: 3.4 mg/dL — ABNORMAL HIGH (ref 0.50–1.35)
GFR calc Af Amer: 18 mL/min — ABNORMAL LOW (ref >90)
GFR calc non Af Amer: 15 mL/min — ABNORMAL LOW (ref >90)
Glucose, Bld: 86 mg/dL (ref 70–99)

## 2013-08-02 LAB — GLUCOSE, CAPILLARY: Glucose-Capillary: 122 mg/dL — ABNORMAL HIGH (ref 70–99)

## 2013-08-02 LAB — MAGNESIUM: Magnesium: 1.5 mg/dL (ref 1.5–2.5)

## 2013-08-02 LAB — TROPONIN I: Troponin I: <0.3 ng/mL (ref <0.30)

## 2013-08-02 LAB — PRO B NATRIURETIC PEPTIDE: Pro B Natriuretic peptide (BNP): 7170 pg/mL — ABNORMAL HIGH (ref 0–450)

## 2013-08-02 MED ORDER — MIDAZOLAM HCL 2 MG/2ML IJ SOLN
1.0000 mg | INTRAMUSCULAR | Status: DC | PRN
Start: 2013-08-02 — End: 2013-08-03
  Administered 2013-08-02 – 2013-08-03 (×4): 2 mg via INTRAVENOUS
  Filled 2013-08-02 (×3): qty 2

## 2013-08-02 MED ORDER — PANTOPRAZOLE SODIUM 40 MG IV SOLR
40.0000 mg | INTRAVENOUS | Status: DC
  Administered 2013-08-02 – 2013-08-07 (×6): 40 mg via INTRAVENOUS
  Filled 2013-08-02 (×6): qty 40

## 2013-08-02 MED ORDER — FENTANYL CITRATE 0.05 MG/ML IJ SOLN
INTRAMUSCULAR | Status: AC
  Administered 2013-08-02: 100 ug via INTRAVENOUS
  Filled 2013-08-02: qty 4

## 2013-08-02 MED ORDER — ETOMIDATE 2 MG/ML IV SOLN
10.0000 mg | Freq: Once | INTRAVENOUS | Status: AC
  Administered 2013-08-02: 10 mg via INTRAVENOUS

## 2013-08-02 MED ORDER — METHYLPREDNISOLONE SODIUM SUCC 40 MG IJ SOLR
30.0000 mg | Freq: Every day | INTRAMUSCULAR | Status: DC
  Administered 2013-08-03 – 2013-08-04 (×2): 30 mg via INTRAVENOUS
  Filled 2013-08-02 (×2): qty 0.75

## 2013-08-02 MED ORDER — ROCURONIUM BROMIDE 50 MG/5ML IV SOLN
50.0000 mg | Freq: Once | INTRAVENOUS | Status: AC
  Administered 2013-08-02: 50 mg via INTRAVENOUS

## 2013-08-02 MED ORDER — DEXTROSE 5 % IV SOLN
INTRAVENOUS | Status: DC
  Administered 2013-08-02 – 2013-08-03 (×2): via INTRAVENOUS
  Filled 2013-08-02 (×3): qty 150

## 2013-08-02 MED ORDER — FUROSEMIDE 10 MG/ML IJ SOLN
60.0000 mg | Freq: Once | INTRAMUSCULAR | Status: AC
  Administered 2013-08-02: 60 mg via INTRAVENOUS
  Filled 2013-08-02: qty 6

## 2013-08-02 MED ORDER — SODIUM CHLORIDE 0.9 % IV SOLN: 1000.0000 mL | INTRAVENOUS | Status: DC

## 2013-08-02 MED ORDER — FENTANYL CITRATE 0.05 MG/ML IJ SOLN
25.0000 ug | INTRAMUSCULAR | Status: DC | PRN
  Administered 2013-08-02 – 2013-08-03 (×10): 100 ug via INTRAVENOUS
  Filled 2013-08-02 (×6): qty 2

## 2013-08-02 MED ORDER — FUROSEMIDE 10 MG/ML IJ SOLN
40.0000 mg | Freq: Once | INTRAMUSCULAR | Status: AC
  Administered 2013-08-02: 40 mg via INTRAVENOUS
  Filled 2013-08-02: qty 4

## 2013-08-02 MED ORDER — ACETAMINOPHEN 650 MG RE SUPP: 650.0000 mg | Freq: Once | RECTAL | Status: DC

## 2013-08-02 MED ORDER — HYDROCORTISONE SOD SUCCINATE 100 MG IJ SOLR
40.0000 mg | Freq: Every day | INTRAMUSCULAR | Status: DC
  Administered 2013-08-02: 40 mg via INTRAVENOUS
  Filled 2013-08-02: qty 0.8

## 2013-08-02 MED ORDER — SODIUM CHLORIDE 0.9 % IV SOLN
1000.0000 mL | INTRAVENOUS | Status: DC
  Administered 2013-08-02: 1000 mL via INTRAVENOUS

## 2013-08-02 MED ORDER — FENTANYL CITRATE 0.05 MG/ML IJ SOLN
25.0000 ug | INTRAMUSCULAR | Status: DC | PRN
Start: 2013-08-02 — End: 2013-08-04
  Administered 2013-08-02: 100 ug via INTRAVENOUS
  Filled 2013-08-02 (×3): qty 2

## 2013-08-02 MED ORDER — MIDAZOLAM HCL 2 MG/2ML IJ SOLN
INTRAMUSCULAR | Status: AC
  Administered 2013-08-02: 2 mg
  Filled 2013-08-02: qty 4

## 2013-08-02 NOTE — Progress Notes (Signed)
Panic values received from lab at 1455.   PH 7.108, pO2 119. CO2 61.4, HCO3 18.6, sat 97.6%. Dr Sharon Seller text paged with values at 1457.  Jon Silk, NP returned the call at 1459 and advised of ABG results.  She stated that CCM (Dr. Vassie Loll) would become involved in the care of this patient and to be expecting orders for Bipap.  Nicole Cella, RN

## 2013-08-02 NOTE — Progress Notes (Signed)
CRITICAL VALUE ALERT  Critical value received:  ABG panic values:     pH 7.108,  pO2 119,  CO2 61.4,  HCO3 18.6,  Sat 97.6%    Date of notification:  08/02/2013  Time of notification:  1455  Critical value read back:yes  Nurse who received alert:  Nicole Cella, RN   MD notified (1st page):  [place] `  Time of first page:  1457  MD notified (2nd page):  Time of second page:  Responding MD:  Junious Silk, NP  Time MD responded:  [phone]

## 2013-08-02 NOTE — Procedures (Signed)
Arterial Catheter Insertion Procedure Note [facility] [id number] 05-08-1928  Procedure: Insertion of Arterial Catheter  Indications: Blood pressure monitoring  Procedure Details Consent: Unable to obtain consent because of emergent medical necessity. Time Out: Verified patient identification, verified procedure, site/side was marked, verified correct patient position, special equipment/implants available, medications/allergies/relevent history reviewed, required imaging and test results available.  Performed  Maximum sterile technique was used including antiseptics, cap, gloves, gown, hand hygiene, mask and sheet. Skin prep: Chlorhexidine; local anesthetic administered 20 gauge catheter was inserted into right radial artery using the Seldinger technique.  Evaluation Blood flow good; BP tracing good. Complications: No apparent complications.   [name],[name] 08/02/2013

## 2013-08-02 NOTE — Progress Notes (Signed)
Patient was transferred to 2S Room 4 on BiPap.   Daughter, Issaac Shipper was notified of this transfer.

## 2013-08-02 NOTE — Consult Note (Signed)
Patient seen, examined, and I agree with the above documentation, including the assessment and plan. [name] has a normocytic anemia with recent rectal bleeding. This seems to have resolved at this point Respiratory status has worsened and he has been started on BiPAP with plans to move him to ICU imminently At this point he remains full code Monitor for ongoing or further bleeding, no plans for colonoscopy or flexible sigmoidoscopy at this point due to worsening respiratory status Monitor hemoglobin

## 2013-08-02 NOTE — Consult Note (Signed)
[facility] Consult: 1:19 PM 08/02/2013  LOS: 2 days    Referring Provider: Dr Sharon Seller  Primary Care Physician:  [name], MD Primary Gastroenterologist:  Dr. Virginia Rochester     Reason for Consultation:  Hematochezia   HPI: [name] is a 79 y.o. male.  Hx FTT with SNF stay in July 2014 but living at home since then. Hx Rhematoid arthritis. Hx recurrent UTIs  On 2007 colonoscopy and EGD had barretts esophagus and HP polyps.  No record of endoscopies since then.  On daily Omeprazole 20 mg. Also on med list is Fosamax, 81 mg ASA, 5 mg Prednisone, .   Admitted 2 days ago with cough, chills, hypoxia, hypothermia, acute on chronic renal failure.   XRays with mild CHF, consolidation/atx,  bil hydronephrosis. He is significantly lethargic per RN and he coughed when given liquids.  He is now NPO   Hgb drop from 10 (baseline~10.5) to 7.8.  Transfused 2 units up to 10.8. MCV in low 90s. Coags normal.  In 05/2012 he had low iron, low TIBC but Ferritin of 785.  Rectal bleeding noted by NPon 11/3 at which point Lovenox was stopped. No stools or blood since then.  Rectal thermometer in place since admission.  ROS significant for poor appetite and po intake at home.  No reports as to choking on foods.  wheezing and ronchi noted today.   Past Medical History  Diagnosis Date  . Hypertension   . Arthritis   . Ex-smoker   . Rheumatoid arthritis(714.0)   . Pulmonary nodule   . DDD (degenerative disc disease)   . Complication of anesthesia     " I am difficult to wake up "  . Chronic kidney disease     acute on chronic  . Anemia     Past Surgical History  Procedure Laterality Date  . Joint replacement      bilateral knee replacement  . Hernia repair    . Back surgery      X 2  . Appendectomy      Prior to Admission medications   Medication Sig Start Date End Date Taking? Authorizing Provider  albuterol (PROVENTIL) (2.5 MG/3ML) 0.083%  nebulizer solution Take 2.5 mg by nebulization every 4 (four) hours as needed. For shortness of breath   Yes Historical Provider, MD  alendronate (FOSAMAX) 70 MG tablet Take 70 mg by mouth every 7 (seven) days. Take with a full glass of water on an empty stomach.  On Friday   Yes Historical Provider, MD  aspirin EC 81 MG tablet Take 81 mg by mouth every morning.    Yes Historical Provider, MD  Cholecalciferol (VITAMIN D) 2000 UNITS tablet Take 2,000 Units by mouth every morning.    Yes Historical Provider, MD  escitalopram (LEXAPRO) 10 MG tablet Take 10 mg by mouth daily.   Yes Historical Provider, MD  gabapentin (NEURONTIN) 300 MG capsule Take 300 mg by mouth at bedtime.   Yes Historical Provider, MD  metoprolol tartrate (LOPRESSOR) 25 MG tablet Take 2 tablets (50 mg total) by mouth 2 (two) times daily. 05/05/13  Yes Elease Etienne, MD  omeprazole (PRILOSEC) 20 MG capsule Take 20 mg by mouth daily.    Yes Historical Provider, MD  predniSONE (DELTASONE) 5 MG tablet Take 5 mg by mouth daily.   Yes Historical Provider, MD  Probiotic Product (ALIGN) 4 MG CAPS Take 1 capsule by mouth every morning.   Yes Historical Provider, MD  SUPER B COMPLEX/C CAPS  Take 1 capsule by mouth daily.   Yes Historical Provider, MD  Tamsulosin HCl (FLOMAX) 0.4 MG CAPS Take 1 capsule (0.4 mg total) by mouth daily after supper. 06/13/12  Yes Vassie Loll, MD  traMADol-acetaminophen (ULTRACET) 37.5-325 MG per tablet Take 1 tablet by mouth every 6 (six) hours as needed for pain. 05/05/13  Yes Elease Etienne, MD  trimethoprim (TRIMPEX) 100 MG tablet Take 100 mg by mouth daily.   Yes Historical Provider, MD    Scheduled Meds: . cefTRIAXone (ROCEPHIN)  IV  1 g Intravenous Q24H  . feeding supplement (RESOURCE BREEZE)  1 Container Oral TID BM  . hydrocortisone sod succinate (SOLU-CORTEF) inj  40 mg Intravenous Daily  . pantoprazole (PROTONIX) IV  40 mg Intravenous Q24H  . sodium chloride  3 mL Intravenous Q12H  . tamsulosin   0.4 mg Oral QPC supper  . vancomycin  1,000 mg Intravenous Q48H   Infusions: . sodium chloride 1,000 mL (08/02/13 1122)   PRN Meds: albuterol, ondansetron (ZOFRAN) IV, ondansetron   Allergies as of 07/31/2013 - Review Complete 07/31/2013  Allergen Reaction Noted  . Codeine Other (See Comments) 05/15/2009  . Hydrocodone Other (See Comments) 05/08/2012  . Oxycodone Other (See Comments) 05/08/2012  . Penicillins Hives 05/15/2009  . Sulfa antibiotics Other (See Comments) 01/03/2013    Family History  Problem Relation Age of Onset  . Coronary artery disease Brother   . Hypertension Father   . Hypertension Mother     History   Social History  . Marital Status: Married    Spouse Name: N/A    Number of Children: N/A  . Years of Education: N/A   Occupational History  . Not on file.   Social History Main Topics  . Smoking status: Former Smoker -- 0.50 packs/day for 30 years    Types: Cigarettes    Quit date: 10/12/1973  . Smokeless tobacco: Never Used  . Alcohol Use: No  . Drug Use: No  . Sexual Activity: No   Other Topics Concern  . Not on file   Social History Narrative  . No narrative on file    REVIEW OF SYSTEMS: Constitutional:  Seen by niece last week and was driving a golf cart.  ENT:  No nose bleeds Pulm:  Per HPI CV:  Unable to assess as pt very somnolent and non-verbal GU:   GI:  RN states he was coughing when given clear liquids so she stopped feeding the pt Heme:  .    Transfusions:   Neuro:  Very weak and non verbal today Derm:   Endocrine:   Immunization:  Unknown if had flu shot Travel:  Unknown.    PHYSICAL EXAM: Vital signs in last 24 hours: Filed Vitals:   08/02/13 1153  BP: 123/60  Pulse: 101  Temp: 98.4 F (36.9 C)  Resp: 20   Wt Readings from Last 3 Encounters:  07/31/13 78.1 kg (172 lb 2.9 oz)  05/04/13 72.258 kg (159 lb 4.8 oz)  03/03/13 78.382 kg (172 lb 12.8 oz)   General: Lethargic, does open eyes to my speech  Head:   Tilted to left  Eyes:  Subconjunctival hemorrhage on left Ears:  Unable to assess well but he did repond to my voice  Nose:  No discharge Mouth:  No upper teeth few lower front teeth riddled with plaque and tartar Neck:  No mass, no JVD Lungs:  Rattling breathsounds throughout.  Heart: Irregular, atrial fib/flutter on monitor, but many visible p waves,  rate in 90s Abdomen:  Soft, healed low midline scar, no mass, no anasarca, no bruits.  BS active.  Not tender.   Rectal: no mass, rectal thermometer in place.  Blood on exam glove.    Musc/Skeltl: no joint swellilng Extremities:  Feet warm and minimally edematous.  Edematous UEs  Neurologic:  With loud voice does open eyes and protrude tongue when requested.  Skin: dry with actinic keratosis.   Tattoos:  None Nodes:  No cervical adenopathy.    Psych:  Unable to assess due to somnolence.   Intake/Output from previous day: 11/04 0701 - 11/05 0700 In: 3862.4 [P.O.:240; I.V.:2910.4; Blood:662; IV Piggyback:50] Out: 1150 [Urine:1150] Intake/Output this shift: Total I/O In: 233.2 [I.V.:233.2] Out: 1300 [Urine:1300]  LAB RESULTS:  Recent Labs  07/31/13 1110 08/01/13 0355 08/01/13 1920  WBC 9.3 7.2 8.7  HGB 10.0* 7.8* 10.8*  HCT 31.4* 23.7* 32.7*  PLT 172 162 162   BMET Lab Results  Component Value Date   NA 146* 08/02/2013   NA 143 08/01/2013   NA 142 07/31/2013   K 4.6 08/02/2013   K 5.5* 08/01/2013   K 4.7 07/31/2013   CL 119* 08/02/2013   CL 116* 08/01/2013   CL 114* 07/31/2013   CO2 17* 08/02/2013   CO2 18* 08/01/2013   CO2 14* 07/31/2013   GLUCOSE 86 08/02/2013   GLUCOSE 119* 08/01/2013   GLUCOSE 138* 07/31/2013   BUN 50* 08/02/2013   BUN 49* 08/01/2013   BUN 48* 07/31/2013   CREATININE 3.40* 08/02/2013   CREATININE 3.71* 08/01/2013   CREATININE 3.73* 07/31/2013   CALCIUM 8.5 08/02/2013   CALCIUM 8.8 08/01/2013   CALCIUM 9.7 07/31/2013   LFT No results found for this basename: PROT, ALBUMIN, AST, ALT, ALKPHOS, BILITOT,  BILIDIR, IBILI,  in the last 72 hours PT/INR Lab Results  Component Value Date   INR 0.95 03/06/2012   INR 1.03 10/12/2011   INR 1.0 05/01/2009    RADIOLOGY STUDIES: US Renal 07/31/2013     FINDINGS: Right Kidney  Length: 10.8 cm. Normal echogenicity and normal renal cortical thickness. No focal lesions. Moderate to advanced hydronephrosis.  Left Kidney  Length: 10.8 cm. Normal echogenicity and normal renal cortical thickness. Moderate hydronephrosis. No definite renal calculi.  Bladder  Decompressed by a Foley catheter.  IMPRESSION: Bilateral hydronephrosis, right greater than left.   Electronically Signed   By: Loralie Champagne M.D.   On: 07/31/2013 18:48   Dg Chest Port 1 View 08/02/2013    FINDINGS: Lung volumes are low. Bibasilar opacities may reflect areas of atelectasis and/or consolidation. There is cephalization of the pulmonary vasculature and slight indistinctness of the interstitial markings suggestive of mild pulmonary edema. Small bilateral pleural effusions. Mild cardiomegaly. The patient is rotated to the left on today's exam, resulting in distortion of the mediastinal contours and reduced diagnostic sensitivity and specificity for mediastinal pathology. Status post median sternotomy.  IMPRESSION: 1. The appearance the chest suggests developing mild congestive heart failure. 2. Bibasilar atelectasis and/or consolidation.   Electronically Signed   By: Trudie Reed M.D.   On: 08/02/2013 06:29    ENDOSCOPIC STUDIES: 04/2006  Colonoscopy for hx colon polyp Dr Virginia Rochester FINDINGS: Small polyp of rectum, otherwise unremarkable colonoscopic  examination to cecum. Plan await biopsy report. The patient will call me  for results and follow-up with me as an outpatient.  04/2006  EGD for GERD FINDINGS: Barrett's esophagus biopsied. Await biopsy report. The patient  will call me for results and  follow-up with me as an outpatient. Proceed to  colonoscopy as planned.  Pathology 1. ESOPHAGUS:  INTESTINAL METAPLASIA (GOBLET CELL METAPLASIA) CONSISTENT WITH BARRETT' S ESOPHAGUS. NO DYSPLASIA OR MALIGNANCY IDENTIFIED.  2. COLON, POLYP AT 15 CM): HYPERPLASTIC POLYP(S). NO ADENOMATOUS CHANGE OR MALIGNANCY IDENTIFIED.   IMPRESSION:   *  Normocytic anemia.  S/p 2 units PRBCs with appropriate response. *  Hematochezia. A rectal thermometer was placed around same time that the bleeding was reported.  Wonder if there was some sort of trauma related to the rectal probe.  Though no gross bleeding, he does have fresh blood on the exam glove.  Have no information regarding bowel habits, but as he looks today: bed ridden, weak would be at risk of constipation and associated stercoral ulcer.  *  Hx Barretts esophagus.  Last EGD was 46962.  On low dose, daily PPI *  Dysphagia currently in setting of AMS.  Should be kept npo. *  Rheumatoid arthritis *  Stage 3 CKD with recurrent acute renal failure  *  Acute recurrent UTI with sepsis (hypotension, hypothermia). bil hydronephrosis currently.  *  PNA?  Looks at risk for aspiration currently.   *  Dementia?Marland Kitchen  Acute AMS currently.  Atrophy, and microvascular ischemia on 02/2013 CT head.     PLAN:     *  In no shape for flex sig or for colonoscopy at present.  *  Looks like he needs palliative care goals of care consult if family open to this.   As he looks this PM would not be surprised if staff is called to intubate pt to protect airway in the next 18 hours.  Currently he is full code.    [name]  08/02/2013, 1:19 PM Pager: [phone]

## 2013-08-02 NOTE — Consult Note (Signed)
PULMONARY  / CRITICAL CARE MEDICINE  Name: [name] MRN: [id number] DOB: 05/05/1928    ADMISSION DATE:  07/31/2013 CONSULTATION DATE:  08/02/13  REFERRING MD : Glendon Axe PRIMARY SERVICE: PCCM  CHIEF COMPLAINT: Acute Hypercarbic Respiratory Failure requiring BIPAP.  BRIEF PATIENT DESCRIPTION: 79 year old male with history of COPD, RA on pred and multiple UTI's admitted 11/3 for SOB / hypoxia, acute on chronic renal failure, suspected UTI and hypothermia.  Course complicated by GIB and progressive resp decline.  PCCM consulted for eval.    SIGNIFICANT EVENTS / STUDIES:  11/3: Admitted with worsening SOB 11/4: BRBPR with Hgb drop from 9 to 7.8, tx 2 units PRBC's 11/5: Acute Hypercarbic Respiratory Failure requiring bipap & transfer to ICU    LINES / TUBES: None  CULTURES: 11/3 urine>>> no growth ( may be false negative as pt was on OP ABX PTA  ANTIBIOTICS: Rocephin 11/3>>> Vancomycin 11/3>>>   HISTORY OF PRESENT ILLNESS: 79 y.o. male with prior h/o COPD, hypertension, DDD, RA on prednisone, mulitple UTI'S in the past, Brought to ER on 11/3 by his daughter for reported SOB, productive cough and chills. He was given a breathing treatment by EMS and on arrival to ED he was found to be hypoxic, and required oxygen.  He was also found to be in acute on chronic renal failure, with hypothermia and had a suspected UTI. After arrival to the SDU pt had episode of hematochezia with clots. Lovenox for DVT prophylaxis was stopped at that time. Hgb dropped from 9 to 7.8.  Transfused 2 units PRBC's.  Pt has had a significant worsening of respiratory status overnight 11/4-11/5, becoming increasingly lethargic with AMS.  ABG 11/5 revealed respiratory acidosis with CO2 of 61. CXR is consistent with volume overload. Pt. Placed on BIPAP and CCM consulted to assume care.    PAST MEDICAL HISTORY :  Past Medical History  Diagnosis Date  . Hypertension   . Arthritis   . Ex-smoker   .  Rheumatoid arthritis(714.0)   . Pulmonary nodule   . DDD (degenerative disc disease)   . Complication of anesthesia     " I am difficult to wake up "  . Chronic kidney disease     acute 2013, 2014 on chronic stage 3.  . Anemia   . Proteus septicemia 02/2013  . Barrett's esophagus 04/2006    egd by dr Virginia Rochester.   . Colon polyp 04/2006    Hyperplastic on colonoscopy by Dr Virginia Rochester.   . Encephalopathy, metabolic 02/2013   Past Surgical History  Procedure Laterality Date  . Joint replacement      bilateral knee replacement  . Hernia repair    . Back surgery      X 2  . Appendectomy     Prior to Admission medications   Medication Sig Start Date End Date Taking? Authorizing Provider  albuterol (PROVENTIL) (2.5 MG/3ML) 0.083% nebulizer solution Take 2.5 mg by nebulization every 4 (four) hours as needed. For shortness of breath   Yes Historical Provider, MD  alendronate (FOSAMAX) 70 MG tablet Take 70 mg by mouth every 7 (seven) days. Take with a full glass of water on an empty stomach.  On Friday   Yes Historical Provider, MD  aspirin EC 81 MG tablet Take 81 mg by mouth every morning.    Yes Historical Provider, MD  Cholecalciferol (VITAMIN D) 2000 UNITS tablet Take 2,000 Units by mouth every morning.    Yes Historical Provider, MD  escitalopram (  LEXAPRO) 10 MG tablet Take 10 mg by mouth daily.   Yes Historical Provider, MD  gabapentin (NEURONTIN) 300 MG capsule Take 300 mg by mouth at bedtime.   Yes Historical Provider, MD  metoprolol tartrate (LOPRESSOR) 25 MG tablet Take 2 tablets (50 mg total) by mouth 2 (two) times daily. 05/05/13  Yes Elease Etienne, MD  omeprazole (PRILOSEC) 20 MG capsule Take 20 mg by mouth daily.    Yes Historical Provider, MD  predniSONE (DELTASONE) 5 MG tablet Take 5 mg by mouth daily.   Yes Historical Provider, MD  Probiotic Product (ALIGN) 4 MG CAPS Take 1 capsule by mouth every morning.   Yes Historical Provider, MD  SUPER B COMPLEX/C CAPS Take 1 capsule by mouth daily.    Yes Historical Provider, MD  Tamsulosin HCl (FLOMAX) 0.4 MG CAPS Take 1 capsule (0.4 mg total) by mouth daily after supper. 06/13/12  Yes Vassie Loll, MD  traMADol-acetaminophen (ULTRACET) 37.5-325 MG per tablet Take 1 tablet by mouth every 6 (six) hours as needed for pain. 05/05/13  Yes Elease Etienne, MD  trimethoprim (TRIMPEX) 100 MG tablet Take 100 mg by mouth daily.   Yes Historical Provider, MD   Allergies  Allergen Reactions  . Codeine Other (See Comments)     drives me crazy  . Hydrocodone Other (See Comments)    Makes patient hallucinate, cannot sleep  . Oxycodone Other (See Comments)    Makes patient hallucinate, cannot sleep  . Penicillins Hives  . Sulfa Antibiotics Other (See Comments)    Per MAR    FAMILY HISTORY:  Family History  Problem Relation Age of Onset  . Coronary artery disease Brother   . Hypertension Father   . Hypertension Mother    SOCIAL HISTORY:  reports that he quit smoking about 39 years ago. His smoking use included Cigarettes. He has a 15 pack-year smoking history. He has never used smokeless tobacco. He reports that he does not drink alcohol or use illicit drugs.  REVIEW OF SYSTEMS:  Unable as patient is  on bipap Reviewed h & P  SUBJECTIVE:  RN reports increased lethargy, pt on BiPAP with increased work of breathing.  VITAL SIGNS: Temp:  [98 F (36.7 C)-98.4 F (36.9 C)] 98.2 F (36.8 C) (11/05 1639) Pulse Rate:  [85-101] 89 (11/05 1639) Resp:  [11-26] 20 (11/05 1639) BP: (106-126)/(47-74) 126/55 mmHg (11/05 1639) SpO2:  [93 %-97 %] 97 % (11/05 1639)  HEMODYNAMICS:    VENTILATOR SETTINGS:    INTAKE / OUTPUT: Intake/Output     11/04 0701 - 11/05 0700 11/05 0701 - 11/06 0700   P.O. 240    I.V. (mL/kg) 2910.4 (37.3) 333.2 (4.3)   Blood 662    IV Piggyback 50    Total Intake(mL/kg) 3862.4 (49.5) 333.2 (4.3)   Urine (mL/kg/hr) 1150 (0.6) 2850 (3.5)   Total Output 1150 2850   Net +2712.4 -2516.8          PHYSICAL  EXAMINATION: General:  Lethargic elderly male, on BiPAP. Neuro:  Lethargic, follows simple commands HEENT: Normocephalic, atraumatic, on BiPap Cardiovascular:  RRR no Murmur, gallop or rub, Normal S1 and S2, no peripheral edema Lungs: Bilateral excursion even, with increased work of breathing, shallow respirations with rales auscultated bilaterally. Abdomen:  Soft and non-tender with positive but diminished bowel sounds. Musculoskeletal: No significant cyanosis, otherwise grossly intact Skin: Intact with some bruising noted.  LABS:  CBC Recent Labs     07/31/13  1110  08/01/13  0355  08/01/13  1920  WBC  9.3  7.2  8.7  HGB  10.0*  7.8*  10.8*  HCT  31.4*  23.7*  32.7*  PLT  172  162  162   Coag's No results found for this basename: APTT, INR,  in the last 72 hours BMET Recent Labs     07/31/13  1110  08/01/13  0355  08/02/13  0818  NA  142  143  146*  K  4.7  5.5*  4.6  CL  114*  116*  119*  CO2  14*  18*  17*  BUN  48*  49*  50*  CREATININE  3.73*  3.71*  3.40*  GLUCOSE  138*  119*  86   Electrolytes Recent Labs     07/31/13  1110  08/01/13  0355  08/02/13  0818  CALCIUM  9.7  8.8  8.5   Sepsis Markers Recent Labs     08/01/13  0756  PROCALCITON  <0.10   ABG Recent Labs     08/02/13  1329  PHART  7.108*  PCO2ART  61.4*  PO2ART  119.0*   Liver Enzymes No results found for this basename: AST, ALT, ALKPHOS, BILITOT, ALBUMIN,  in the last 72 hours Cardiac Enzymes No results found for this basename: TROPONINI, PROBNP,  in the last 72 hours Glucose Recent Labs     08/01/13  0723  08/02/13  0733  GLUCAP  101*  83    Imaging US Renal  07/31/2013   CLINICAL DATA:  Hydronephrosis  EXAM: RENAL/URINARY TRACT ULTRASOUND COMPLETE  COMPARISON:  CT scan 04/29/2013.  FINDINGS: Right Kidney  Length: 10.8 cm. Normal echogenicity and normal renal cortical thickness. No focal lesions. Moderate to advanced hydronephrosis.  Left Kidney  Length: 10.8 cm. Normal  echogenicity and normal renal cortical thickness. Moderate hydronephrosis. No definite renal calculi.  Bladder  Decompressed by a Foley catheter.  IMPRESSION: Bilateral hydronephrosis, right greater than left.   Electronically Signed   By: Loralie Champagne M.D.   On: 07/31/2013 18:48   Dg Chest Port 1 View  08/02/2013   CLINICAL DATA:  Bilateral rhonchi.  EXAM: PORTABLE CHEST - 1 VIEW  COMPARISON:  CHEST x-ray 07/31/2013.  FINDINGS: Lung volumes are low. Bibasilar opacities may reflect areas of atelectasis and/or consolidation. There is cephalization of the pulmonary vasculature and slight indistinctness of the interstitial markings suggestive of mild pulmonary edema. Small bilateral pleural effusions. Mild cardiomegaly. The patient is rotated to the left on today's exam, resulting in distortion of the mediastinal contours and reduced diagnostic sensitivity and specificity for mediastinal pathology. Status post median sternotomy.  IMPRESSION: 1. The appearance the chest suggests developing mild congestive heart failure. 2. Bibasilar atelectasis and/or consolidation.   Electronically Signed   By: Trudie Reed M.D.   On: 08/02/2013 06:29     ASSESSMENT / PLAN:  PULMONARY A: Acute Hypercarbic Respiratory Failure Bibasilar Atelectasis +/- Edema R/O Aspiration Pneumonia (Per nursing staff coughing with clear liquid diet) Former Tobacco Abuse P:   BiPAP  Transfer to ICU 11/5 F/u ABG - if no better will need mech ventilation - I d/w daughter -Short term intubation if worsening respiratory status progresses per pt and family wishes. Maintain SaO2  >  93%. Lasix as ordered by [facility] for diuresis.   CARDIOVASCULAR A: Suspected CHF R/O MI  Hypertension P:  12 Lead EKG BNP Cycle troponins Consider 2D Echo Diuresis as above Pressors if necessary for MAP of > 65  RENAL A:   Acute on Chronic Renal Failure Chronic Bilateral Hydronephrosis Hx Frequent UTI - s/p recent abx  P: Monitor  BMET Diuresis as above. Correct electrolytes as indicated   Bicarb gtt   GASTROINTESTINAL A:   Hematochezia - while on lovenox ? Dysphagia - coughing with thin liquids P:   NPO PPI for prophylaxis GI consulted Trend CBC Speech eval once resp status stablizes  HEMATOLOGIC A: Acute Blood Loss anemia (transfused 2 units 11/3) Hematochezia  P:  Transfuse as needed for HGB >7.0, active bleeding Follow CBC ASA on hold  Lovenox on hold  GI consulted as above SCD's for DVT prophylaxis  INFECTIOUS A:  Suspected UTI - UA consistent with UTI, s/p recent abx Suspected Aspiration Pneumonia P:   Trend Lactate Follow WBC Sputum for culture Blood Cultures x2 Abx as above Consider urology input for hnosis  ENDOCRINE A:  No active problems  P:   CBG's Q 4 SSI if needed   NEUROLOGIC A: AMS 2/2 Hypercarbia P:   Avoid sedating medications BiPap for Hypercarbia  Goals of Care Patient has a high quality of life prior to admit and per his daughter, would want short term life support measures if necessary for a reversible process.     Scribed by Kandice Robinsons. RN ACNP- Student [employer] for Canary Brim, NP-C    Summary - Admitted with acute on chronic renal failure, hypothermic and UTI in setting of hydronephrosis, Mixed acidosis now, remains resposive on bipap - reasonable to trial x 2-4h & start bicarb gtt for metab component to seeif improves - if not, proceed with intubation  I have personally obtained a history, examined the patient, evaluated laboratory and imaging results, formulated the assessment and plan and placed orders.  CRITICAL CARE: The patient is critically ill with multiple organ systems failure and requires high complexity decision making for assessment and support, frequent evaluation and titration of therapies, application of advanced monitoring technologies and extensive interpretation of multiple databases. Critical Care Time devoted to patient care  services described in this note is 60 minutes.    Trip Cavanagh V.  [phone] [id number]  08/02/2013, 5:17 PM

## 2013-08-02 NOTE — Progress Notes (Signed)
[facility]  Patient Status: Active (receiving services up to time of hospitalization)  [facility] is providing the following services: RN  If patient discharges after hours, please call [phone].   Kizzie Furnish 08/02/2013, 10:20 AM

## 2013-08-02 NOTE — Procedures (Signed)
Central Venous Catheter Insertion Procedure Note [name] [id number] December 16, 1927  Procedure: Insertion of Central Venous Catheter Indications: Assessment of intravascular volume, Drug and/or fluid administration and Frequent blood sampling  Procedure Details Consent: Unable to obtain consent because of emergent medical necessity. Time Out: Verified patient identification, verified procedure, site/side was marked, verified correct patient position, special equipment/implants available, medications/allergies/relevent history reviewed, required imaging and test results available.  Performed  Maximum sterile technique was used including antiseptics, cap, gloves, gown, hand hygiene, mask and sheet. Skin prep: Chlorhexidine; local anesthetic administered A antimicrobial bonded/coated triple lumen catheter was placed in the left subclavian vein using the Seldinger technique.  Evaluation Blood flow good Complications: No apparent complications Patient did tolerate procedure well. Chest X-ray ordered to verify placement.  CXR: pending.  U/S used in placement.  [name] 08/02/2013, 9:27 PM

## 2013-08-02 NOTE — Progress Notes (Signed)
Rec'd order for bipap d/t panic ABG values.  CCM in to eval pt, will continue bipap. Pt tol bipap well, no distress noted, pt appears comfortable.  Sats 96-97% on 40% bipap.   Per CCM, Plans for pt to move to ICU.

## 2013-08-02 NOTE — Progress Notes (Signed)
[facility] Progress Note [place] TEAM 1 - Stepdown ICU Team   [name] HQI:[id number] DOB: [birth year] DOA: 07/31/2013 PCP: [name], MD  Brief narrative: 79 y.o. male with prior h/o hypertension, DDD, RA, mulitple UTI'S in the past. Brought to ER by his daughter for reported SOB, productive cough and chills. He was given a breathing treatment by EMS and on arrival to ED he was found to be hypoxic. He was also found to be in acute on chronic renal failure, hypothermic and have a UTI. Pt was a poor historian and most of the history was obtained from the pt 's daughter.  After arrival to the SDU pt had episode of hematochezia with clots. Lovenox for DVT prophylaxis was stopped.   Assessment/Plan:   ?Sepsis -progressive renal failure, possible false negative urine cx due to recent OP anbx's, AMS, acute resp failure -d/w MD and will consult PCCM   Acute hypoxic respiratory failure -required oxygen at admit without any definite abnormalities in CXR and likely due to hypoventilation from critical illness -worsened overnight and CXR c/w volume overload- given Lasix -has begun diuresis but will give 1 more dose of IV Lasix -?? has aspiration due to AMS -since lethargic today will check ABG   3:07 pm ** mixed respiratory and metabolic acidosis/will temporize with BiPAP until PCCM can evaluate but concern needs ETT and tx to ICU    UTI (urinary tract infection) -Recent outpatient treatment for UTI w/ PCN (with Benadryl) -cont empiric anbx's -Cx with insignificant growth (likely due to recent anbx's) but UA highly suggestive of UTI so will continue anbx's -OP Urologist McDiarmid    Hematochezia -onset after admission -no recurrence so far -Lovenox dc'd - d/c ASA for now -CONSULT GI eval  -dtr says pt with recent incontinence of stools and usually associated with course of anbx's-no prior GIB and no melena or blood with recent incontinence    Acute blood loss  anemia -BP soft so transfuse PRBCs -FU CBC   Hypothermia  -From low perfusion -RESOLVED -Cortisol normal -PCT not helpful     Acute renal failure/ CKD (chronic kidney disease) stage 3, GFR 30-59 ml/min -hydrate and avoid nephrotoxic meds -follow lytes  -kvo fluids since BP up and developed resp sx's last night despite hypernatremia    Obstructive uropathy/chronic bilateral hydronephrosis -stable since last admit -cont foley -prior US ? 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 Unable to awake pt during exam  Objective: Blood pressure 123/60, pulse 101, temperature 98.4 F (36.9 C), temperature source Core (Comment), resp. rate 20, height 5\' 5"  (1.651 m), weight 172 lb 2.9 oz (78.1 kg), SpO2 95.00%.  Intake/Output Summary (Last 24 hours) at 08/02/13 1312 Last data filed at 08/02/13  1200  Gross per 24 hour  Intake 4095.59 ml  Output   2100 ml  Net 1995.59 ml   Exam: General: intermittent tachypnea Lungs: faint diffuse exp wheeze,  3 L Cardiovascular: Regular rate and rhythm without murmur gallop or rub  Abdomen: Nontender, nondistended,  soft, bowel sounds positive, no rebound, no ascites, no appreciable mass Musculoskeletal: No significant cyanosis, clubbing of bilateral lower extremities Neurological: Much more lethargic and unable to awaken today  Scheduled Meds:  Scheduled Meds: . cefTRIAXone (ROCEPHIN)  IV  1 g Intravenous Q24H  . feeding supplement (RESOURCE BREEZE)  1 Container Oral TID BM  . hydrocortisone sod succinate (SOLU-CORTEF) inj  40 mg Intravenous Daily  . pantoprazole (PROTONIX) IV  40 mg Intravenous Q24H  . sodium chloride  3 mL Intravenous Q12H  . tamsulosin  0.4 mg Oral QPC supper  . vancomycin  1,000 mg Intravenous Q48H   Data Reviewed: Basic Metabolic Panel:  Recent Labs Lab 07/31/13 1110 08/01/13 0355 08/02/13 0818  NA 142 143 146*  K 4.7 5.5* 4.6  CL 114* 116* 119*  CO2 14* 18* 17*  GLUCOSE 138* 119* 86  BUN 48* 49* 50*  CREATININE 3.73* 3.71* 3.40*  CALCIUM 9.7 8.8 8.5   CBC:  Recent Labs Lab 07/31/13 1110 08/01/13 0355 08/01/13 1920  WBC 9.3 7.2 8.7  HGB 10.0* 7.8* 10.8*  HCT 31.4* 23.7* 32.7*  MCV 92.6 91.5 90.3  PLT 172 162 162    CBG:  Recent Labs Lab 08/01/13 0723 08/02/13 0733  GLUCAP 101* 83    Recent Results (from the past 240 hour(s))  CULTURE, BLOOD (ROUTINE X 2)     Status: None   Collection Time    07/31/13  1:57 PM      Result Value Range Status   Specimen Description BLOOD LEFT HAND   Final   Special Requests BOTTLES DRAWN AEROBIC ONLY   Final   Culture  Setup Time     Final   Value: 07/31/2013 22:47     Performed at [facility]   Culture     Final   Value:        BLOOD CULTURE RECEIVED NO GROWTH TO DATE CULTURE WILL BE HELD FOR 5 DAYS BEFORE ISSUING A FINAL NEGATIVE REPORT     Performed at [facility]   Report Status PENDING   Incomplete  URINE CULTURE     Status: None   Collection Time    07/31/13  2:06 PM      Result Value Range Status   Specimen Description URINE, CATHETERIZED   Final   Special Requests NONE    Final   Culture  Setup Time     Final   Value: 07/31/2013 16:30     Performed at [facility] Count     Final   Value: 5,000 COLONIES/ML     Performed at [facility]   Culture     Final   Value: INSIGNIFICANT GROWTH     Performed at [facility]   Report Status 08/02/2013 FINAL   Final  CULTURE, BLOOD (ROUTINE X 2)     Status: None   Collection Time    07/31/13  3:51 PM      Result Value Range Status   Specimen Description BLOOD LEFT ANTECUBITAL   Final   Special Requests BOTTLES DRAWN AEROBIC ONLY   Final   Culture  Setup Time     Final  Value: 07/31/2013 22:46     Performed at [facility]   Culture     Final   Value:        BLOOD CULTURE RECEIVED NO GROWTH TO DATE CULTURE WILL BE HELD FOR 5 DAYS BEFORE ISSUING A FINAL NEGATIVE REPORT     Performed at [facility]   Report Status PENDING   Incomplete  MRSA PCR SCREENING     Status: None   Collection Time    07/31/13  7:16 PM      Result Value Range Status   MRSA by PCR NEGATIVE  NEGATIVE Final   Comment:            The GeneXpert MRSA Assay (FDA     approved for NASAL specimens     only), is one component of a     comprehensive MRSA colonization     surveillance program. It is not     intended to diagnose MRSA     infection nor to guide or     monitor treatment for     MRSA infections.     Studies:  Recent x-ray studies have been reviewed in detail by the Attending Physician     Junious Silk, ANP [facility] Office  [phone] Pager [phone]  **If unable to reach the above provider after paging please contact the Flow Manager @ [phone]  On-Call/Text Page:      [email or web address]      password [email or web address]  If 7PM-7AM, please contact night-coverage [email or web address] Password [email or web address] 08/02/2013, 1:12 PM   LOS: 2 days   I have personally examined this patient and reviewed the entire database. I have reviewed the above note, made any necessary editorial  changes, and agree with its content.  Lonia Blood, MD [facility]

## 2013-08-02 NOTE — Procedures (Signed)
Intubation Procedure Note [name] [id number] 1927/10/20  Procedure: Intubation Indications: Respiratory insufficiency  Procedure Details Consent: Unable to obtain consent because of emergent medical necessity. Time Out: Verified patient identification, verified procedure, site/side was marked, verified correct patient position, special equipment/implants available, medications/allergies/relevent history reviewed, required imaging and test results available.  Performed  Maximum sterile technique was used including antiseptics, gloves, hand hygiene and mask.  MAC    Evaluation Hemodynamic Status: BP stable throughout; O2 sats: stable throughout Patient's Current Condition: stable Complications: No apparent complications Patient did tolerate procedure well. Chest X-ray ordered to verify placement.  CXR: pending.   Kaity Pitstick 08/02/2013

## 2013-08-02 NOTE — Progress Notes (Signed)
PA was paged to eval. Pt's chest x'ray when it resulted, order received to give 40 mg of iv laxis and decrease NS from 125cc/hr to infuse at 50cc/hr. Order has been carried out. Staff will continue to monitor pt.----Jodey Burbano, rn

## 2013-08-02 NOTE — Progress Notes (Signed)
Pt has been having on-going wheezing and rhonchi per report since yesterday, and also on assessment. Pt has ARF, NS infusing at 125cc/hr,  RT was called to assess and gave breathing treatment with no relief or change, PA on-call K. Schorr was made aware, chest x'ray ordered for the am. Will continue to monitor pt.----Keilee Denman, rn

## 2013-08-02 NOTE — Progress Notes (Signed)
[facility]  Patient Status: Active (receiving services up to time of hospitalization)  [facility] is providing the following services: RN  If patient discharges after hours, please call [phone].   Wynelle Bourgeois 08/02/2013, 5:43 PM

## 2013-08-02 NOTE — Progress Notes (Deleted)
  Febrile   Plan Rectal tylenol x 1   Dr. Kalman Shan, M.D., [facility].C.P Pulmonary and Critical Care Medicine Staff Physician  System [place] [facility] Pager: [phone], If no answer or between  15:00h - 7:00h: call [phone]  08/02/2013 7:43 PM

## 2013-08-02 NOTE — Progress Notes (Signed)
Called to see by eMD.  Patient decompensating from a respiratory standpoint.  ABG repeated with pH of 7.14, improving mildly with BiPAP but not enough.  Mental status is marginal.  Spoke with family, they wish for full support for now.  Will need intubation and full vent support.  Agree with further medications.  Will also need TLC placement.  Additional CC time 30 min.  Alyson Reedy, M.D. [facility] Pulmonary/Critical Care Medicine. Pager: [phone]. After hours pager: [phone].

## 2013-08-03 ENCOUNTER — Inpatient Hospital Stay (HOSPITAL_COMMUNITY): Payer: Medicare Other

## 2013-08-03 DIAGNOSIS — D62 Acute posthemorrhagic anemia: Secondary | ICD-10-CM

## 2013-08-03 DIAGNOSIS — N19 Unspecified kidney failure: Secondary | ICD-10-CM

## 2013-08-03 LAB — MAGNESIUM
Magnesium: 1.3 mg/dL — ABNORMAL LOW (ref 1.5–2.5)
Magnesium: 1.8 mg/dL (ref 1.5–2.5)

## 2013-08-03 LAB — TROPONIN I
Troponin I: <0.3 ng/mL (ref <0.30)
Troponin I: <0.3 ng/mL (ref <0.30)

## 2013-08-03 LAB — GLUCOSE, CAPILLARY: Glucose-Capillary: 135 mg/dL — ABNORMAL HIGH (ref 70–99)

## 2013-08-03 LAB — BASIC METABOLIC PANEL
BUN: 51 mg/dL — ABNORMAL HIGH (ref 6–23)
CO2: 24 mEq/L (ref 19–32)
CO2: 25 mEq/L (ref 19–32)
Calcium: 8.4 mg/dL (ref 8.4–10.5)
Calcium: 8.3 mg/dL — ABNORMAL LOW (ref 8.4–10.5)
Chloride: 112 mEq/L (ref 96–112)
Chloride: 108 mEq/L (ref 96–112)
Glucose, Bld: 99 mg/dL (ref 70–99)
Glucose, Bld: 160 mg/dL — ABNORMAL HIGH (ref 70–99)
Potassium: 3.4 mEq/L — ABNORMAL LOW (ref 3.5–5.1)
Potassium: 3.9 mEq/L (ref 3.5–5.1)
Sodium: 147 mEq/L — ABNORMAL HIGH (ref 135–145)
Sodium: 146 mEq/L — ABNORMAL HIGH (ref 135–145)

## 2013-08-03 LAB — CBC
HCT: 32.7 % — ABNORMAL LOW (ref 39.0–52.0)
Hemoglobin: 10.3 g/dL — ABNORMAL LOW (ref 13.0–17.0)
Hemoglobin: 11 g/dL — ABNORMAL LOW (ref 13.0–17.0)
MCH: 29.6 pg (ref 26.0–34.0)
MCHC: 32.4 g/dL (ref 30.0–36.0)
MCV: 88.1 fL (ref 78.0–100.0)
Platelets: 136 10*3/uL — ABNORMAL LOW (ref 150–400)
RBC: 3.52 MIL/uL — ABNORMAL LOW (ref 4.22–5.81)
RBC: 3.71 MIL/uL — ABNORMAL LOW (ref 4.22–5.81)
RDW: 17.8 % — ABNORMAL HIGH (ref 11.5–15.5)
WBC: 7 10*3/uL (ref 4.0–10.5)
WBC: 7.2 10*3/uL (ref 4.0–10.5)

## 2013-08-03 LAB — BLOOD GAS, ARTERIAL
Acid-base deficit: 1 mmol/L (ref 0.0–2.0)
Bicarbonate: 24 mEq/L (ref 20.0–24.0)
Drawn by: 252031
FIO2: 0.4 %
O2 Saturation: 95.9 %
RATE: 18 resp/min
TCO2: 25.4 mmol/L (ref 0–100)
pCO2 arterial: 46.1 mmHg — ABNORMAL HIGH (ref 35.0–45.0)
pH, Arterial: 7.336 — ABNORMAL LOW (ref 7.350–7.450)
pO2, Arterial: 114 mmHg — ABNORMAL HIGH (ref 80.0–100.0)

## 2013-08-03 LAB — POCT I-STAT 3, ART BLOOD GAS (G3+)
Bicarbonate: 22.9 mEq/L (ref 20.0–24.0)
Patient temperature: 98.6
TCO2: 25 mmol/L (ref 0–100)
pCO2 arterial: 67.2 mmHg (ref 35.0–45.0)
pH, Arterial: 7.14 — CL (ref 7.350–7.450)
pO2, Arterial: 127 mmHg — ABNORMAL HIGH (ref 80.0–100.0)

## 2013-08-03 LAB — URINALYSIS, ROUTINE W REFLEX MICROSCOPIC
Bilirubin Urine: NEGATIVE
Glucose, UA: NEGATIVE mg/dL
Protein, ur: NEGATIVE mg/dL
Urobilinogen, UA: 0.2 mg/dL (ref 0.0–1.0)
pH: 5 (ref 5.0–8.0)

## 2013-08-03 LAB — URINE MICROSCOPIC-ADD ON

## 2013-08-03 LAB — PHOSPHORUS: Phosphorus: 3.1 mg/dL (ref 2.3–4.6)

## 2013-08-03 LAB — PROCALCITONIN: Procalcitonin: 0.12 ng/mL

## 2013-08-03 LAB — TRIGLYCERIDES: Triglycerides: 134 mg/dL (ref <150)

## 2013-08-03 MED ORDER — PROPOFOL 10 MG/ML IV EMUL
0.0000 ug/kg/min | INTRAVENOUS | Status: DC
  Administered 2013-08-03: 20 ug/kg/min via INTRAVENOUS
  Administered 2013-08-04: 25 ug/kg/min via INTRAVENOUS
  Filled 2013-08-03 (×2): qty 100

## 2013-08-03 MED ORDER — CHLORHEXIDINE GLUCONATE 0.12 % MT SOLN
15.0000 mL | Freq: Two times a day (BID) | OROMUCOSAL | Status: DC
  Administered 2013-08-03 – 2013-08-08 (×10): 15 mL via OROMUCOSAL
  Filled 2013-08-03 (×12): qty 15

## 2013-08-03 MED ORDER — MAGNESIUM SULFATE 40 MG/ML IJ SOLN
2.0000 g | Freq: Once | INTRAMUSCULAR | Status: AC
  Administered 2013-08-03: 2 g via INTRAVENOUS
  Filled 2013-08-03: qty 50

## 2013-08-03 MED ORDER — DEXTROSE 5 % IV SOLN
1.0000 g | INTRAVENOUS | Status: DC
  Administered 2013-08-03 – 2013-08-08 (×6): 1 g via INTRAVENOUS
  Filled 2013-08-03 (×6): qty 1

## 2013-08-03 MED ORDER — SODIUM CHLORIDE 0.9 % IJ SOLN: 10.0000 mL | INTRAMUSCULAR | Status: DC | PRN

## 2013-08-03 MED ORDER — POTASSIUM CHLORIDE 20 MEQ/15ML (10%) PO LIQD
20.0000 meq | Freq: Once | ORAL | Status: AC
  Administered 2013-08-03: 20 meq
  Filled 2013-08-03: qty 15

## 2013-08-03 MED ORDER — SODIUM CHLORIDE 0.9 % IJ SOLN
10.0000 mL | Freq: Two times a day (BID) | INTRAMUSCULAR | Status: DC
  Administered 2013-08-03 – 2013-08-06 (×5): 10 mL via INTRAVENOUS

## 2013-08-03 MED ORDER — VITAL AF 1.2 CAL PO LIQD
1000.0000 mL | ORAL | Status: DC
  Administered 2013-08-03: 1000 mL
  Filled 2013-08-03 (×3): qty 1000

## 2013-08-03 MED ORDER — VITAL AF 1.2 CAL PO LIQD
1000.0000 mL | ORAL | Status: DC
  Filled 2013-08-03 (×2): qty 1000

## 2013-08-03 MED ORDER — FUROSEMIDE 10 MG/ML IJ SOLN
40.0000 mg | Freq: Two times a day (BID) | INTRAMUSCULAR | Status: DC
  Administered 2013-08-03 – 2013-08-04 (×3): 40 mg via INTRAVENOUS
  Filled 2013-08-03 (×5): qty 4

## 2013-08-03 MED ORDER — VITAL AF 1.2 CAL PO LIQD
1000.0000 mL | ORAL | Status: DC
  Filled 2013-08-03: qty 1000

## 2013-08-03 MED ORDER — SODIUM CHLORIDE 0.45 % IV SOLN
INTRAVENOUS | Status: DC
  Administered 2013-08-03: 20 mL/h via INTRAVENOUS

## 2013-08-03 NOTE — Progress Notes (Signed)
MD paged regarding increased PVCs. Labs ordered. Will cont to monitor and assess patient

## 2013-08-03 NOTE — Consult Note (Signed)
PULMONARY  / CRITICAL CARE MEDICINE  Name: [name] MRN: [id number] DOB: 1928-01-21    ADMISSION DATE:  07/31/2013 CONSULTATION DATE:  08/02/13  REFERRING MD : Glendon Axe PRIMARY SERVICE: PCCM  CHIEF COMPLAINT: Acute Hypercarbic Respiratory Failure requiring BIPAP.  BRIEF PATIENT DESCRIPTION: 79 year old male with history of COPD, RA on pred and multiple UTI's admitted 11/3 for SOB / hypoxia, acute on chronic renal failure, suspected UTI and hypothermia.  Course complicated by GIB and progressive resp decline.  PCCM consulted for eval.   SIGNIFICANT EVENTS / STUDIES:  11/3: Admitted with worsening SOB 11/4: BRBPR with Hgb drop from 9 to 7.8, tx 2 units PRBC's 11/5: Acute Hypercarbic Respiratory Failure requiring bipap & transfer to ICU 11/5- ETT, lines placed  LINES / TUBES: 11/5 ETT>>> 11/5 A line>>> 11/5 CVL>>>  CULTURES: 11/3 urine>>> insig growth 11/5 BC>>> 11/5 sputum>>>  ANTIBIOTICS: Rocephin 11/3>>> Vancomycin 11/3>>>  SUBJECTIVE:  Required ett  VITAL SIGNS: Temp:  [97.9 F (36.6 C)-98.4 F (36.9 C)] 97.9 F (36.6 C) (11/06 0400) Pulse Rate:  [43-134] 73 (11/06 0700) Resp:  [9-29] 18 (11/06 0700) BP: (84-154)/(51-85) 119/60 mmHg (11/06 0700) SpO2:  [93 %-100 %] 95 % (11/06 0700) Arterial Line BP: (129-205)/(57-99) 129/59 mmHg (11/06 0700) FiO2 (%):  [40 %-100 %] 40 % (11/06 0600)  HEMODYNAMICS: CVP:  [0 mmHg-19 mmHg] 3 mmHg  VENTILATOR SETTINGS: Vent Mode:  [-] PRVC FiO2 (%):  [40 %-100 %] 40 % Set Rate:  [18 bmp] 18 bmp Vt Set:  [450 mL] 450 mL PEEP:  [5 cmH20] 5 cmH20 Plateau Pressure:  [20 cmH20-24 cmH20] 24 cmH20  INTAKE / OUTPUT: Intake/Output     11/05 0701 - 11/06 0700 11/06 0701 - 11/07 0700   P.O.     I.V. (mL/kg) 1491.9 (19.1)    Blood     IV Piggyback 250    Total Intake(mL/kg) 1741.9 (22.3)    Urine (mL/kg/hr) 5775 (3.1)    Total Output 5775     Net -4033.1            PHYSICAL EXAMINATION: General:  Lethargic elderly  intubated Neuro:  rass -2, following int commands HEENT: ett, line Cardiovascular:  RRR no Murmur, gallop or rub, Normal S1 and S2, no peripheral edema Lungs: coarse bilateral Abdomen:  Soft and non-tender with positive but diminished bowel sounds. Musculoskeletal: No significant cyanosis, otherwise grossly intact Skin: Intact with some bruising noted.  LABS:  CBC Recent Labs     08/01/13  0355  08/01/13  1920  08/03/13  0400  WBC  7.2  8.7  7.0  HGB  7.8*  10.8*  10.3*  HCT  23.7*  32.7*  31.8*  PLT  162  162  136*   Coag's No results found for this basename: APTT, INR,  in the last 72 hours BMET Recent Labs     08/01/13  0355  08/02/13  0818  08/03/13  0400  NA  143  146*  147*  K  5.5*  4.6  3.4*  CL  116*  119*  112  CO2  18*  17*  24  BUN  49*  50*  51*  CREATININE  3.71*  3.40*  3.34*  GLUCOSE  119*  86  99   Electrolytes Recent Labs     08/01/13  0355  08/02/13  0818  08/02/13  1740  08/03/13  0400  CALCIUM  8.8  8.5   --   8.4  MG   --    --  1.5  1.3*  PHOS   --    --    --   3.1   Sepsis Markers Recent Labs     08/01/13  0756  08/03/13  0400  PROCALCITON  <0.10  0.12   ABG Recent Labs     08/02/13  2005  08/02/13  2200  08/03/13  0333  PHART  7.140*  7.251*  7.336*  PCO2ART  67.2*  51.2*  46.1*  PO2ART  127.0*  444.0*  114.0*   Liver Enzymes No results found for this basename: AST, ALT, ALKPHOS, BILITOT, ALBUMIN,  in the last 72 hours Cardiac Enzymes Recent Labs     08/02/13  1740  08/02/13  2320  08/03/13  0400  TROPONINI  <0.30  <0.30  <0.30  PROBNP  7170.0*   --    --    Glucose Recent Labs     08/01/13  0723  08/02/13  0733  08/02/13  1930  08/02/13  2228  GLUCAP  101*  83  128*  122*    Imaging Dg Chest Port 1 View  08/02/2013   CLINICAL DATA:  Evaluate endotracheal tube and central line placement.  EXAM: PORTABLE CHEST - 1 VIEW  COMPARISON:  Chest x-ray 08/02/2013.  FINDINGS: An endotracheal tube is in place  with tip 3.2 cm above the carina. A nasogastric tube is seen extending into the stomach, however, the tip of the nasogastric tube extends below the lower margin of the image. There is a left-sided subclavian central venous catheter with tip terminating in the superior cavoatrial junction. Lung volumes are low. Persistent bibasilar opacities may reflect areas of atelectasis and/or consolidation. Small left and small to moderate right pleural effusions are unchanged. Mild crowding of the pulmonary vasculature, without frank pulmonary edema. Mild cardiomegaly. The patient is rotated to the left on today's exam, resulting in distortion of the mediastinal contours and reduced diagnostic sensitivity and specificity for mediastinal pathology. Atherosclerosis in the thoracic aorta. Status post median sternotomy.  IMPRESSION: 1. Support apparatus, as above. 2. Improving aeration, likely related to resolving pulmonary edema. 3. Persistent bibasilar atelectasis and/or consolidation with small to moderate right and small left pleural effusions.   Electronically Signed   By: Trudie Reed M.D.   On: 08/02/2013 22:11   Dg Chest Port 1 View  08/02/2013   CLINICAL DATA:  Bilateral rhonchi.  EXAM: PORTABLE CHEST - 1 VIEW  COMPARISON:  CHEST x-ray 07/31/2013.  FINDINGS: Lung volumes are low. Bibasilar opacities may reflect areas of atelectasis and/or consolidation. There is cephalization of the pulmonary vasculature and slight indistinctness of the interstitial markings suggestive of mild pulmonary edema. Small bilateral pleural effusions. Mild cardiomegaly. The patient is rotated to the left on today's exam, resulting in distortion of the mediastinal contours and reduced diagnostic sensitivity and specificity for mediastinal pathology. Status post median sternotomy.  IMPRESSION: 1. The appearance the chest suggests developing mild congestive heart failure. 2. Bibasilar atelectasis and/or consolidation.   Electronically Signed    By: Trudie Reed M.D.   On: 08/02/2013 06:29   pcxr- bibasilr int changes, effusions bilateral, increased rt base , ett wnl, left [place] wnl  ASSESSMENT / PLAN:  PULMONARY A: Acute Hypercarbic Respiratory Failure Bibasilar Atelectasis +/- Edema R/O Aspiration Pneumonia Former Tobacco Abuse P:   Lasix successful with neg 4 liters pcxr in am  On minimal O2 needs / vent support ABg reviewed maintain same MV Wean SBT , cpap5 ps 5, goal 1 hr, unlikely to  extubate today  CARDIOVASCULAR A: Suspected CHF R/O MI , trop neg Hypertension controlled P:  12 Lead EKG - no st changes BNP impressive Cycle troponins -neg  Consider 2D Echo pending Diuresis to neg 2 liters goal cvp assessment to help guide lasix  RENAL A:   Acute on Chronic Renal Failure Chronic Bilateral Hydronephrosis Hx Frequent UTI - s/p recent abx  HypoK, Mag hypernatremic P: Monitor BMET in am Diuresis, last cvp 12 Correct k, mag Avoid saline Bicarb gtt dc  GASTROINTESTINAL A:   Hematochezia - while on lovenox ? Dysphagia - coughing with thin liquids P:   STart tf , no active bleeding noted PPI for prophylaxis GI consulted Trend CBC Speech eval once resp status stablizes  HEMATOLOGIC A: Acute Blood Loss anemia (transfused 2 units 11/3) Hematochezia  P:  Transfuse as needed for HGB >7.0, active bleeding not noted Follow CBC in afternoon and in am  ASA on hold  Lovenox on hold  GI consulted awaited recs SCD's for DVT prophylaxis  INFECTIOUS A:  Suspected UTI - UA consistent with UTI, s/p recent abx Suspected Aspiration Pneumonia Immune suppressed from steroids, RA P:   Change ceftriaxone to ceftaz given immune suppression continue vanc Repeat UA, if pos, change foley again Consider urology input pending clinical source  ENDOCRINE A:  No active problems  P:   CBG's Q 4 SSI if needed  NEUROLOGIC A: AMS 2/2 Hypercarbia P:   WUA today Dc int versed  Goals of Care Patient has  a high quality of life prior to admit and per his daughter, would want short term life support measures if necessary for a reversible process.  Summary - wean, start TF, lasix continue but reduce  I have personally obtained a history, examined the patient, evaluated laboratory and imaging results, formulated the assessment and plan and placed orders.  CRITICAL CARE: The patient is critically ill with multiple organ systems failure and requires high complexity decision making for assessment and support, frequent evaluation and titration of therapies, application of advanced monitoring technologies and extensive interpretation of multiple databases. Critical Care Time devoted to patient care services described in this note is 30 minutes.   Mcarthur Rossetti. Tyson Alias, MD, FACP Pgr: [phone] [place] [facility]

## 2013-08-03 NOTE — Progress Notes (Signed)
[facility] ADULT ICU REPLACEMENT PROTOCOL FOR AM LAB REPLACEMENT ONLY  The patient does not apply for the [facility] Adult ICU Electrolyte Replacment Protocol based on the criteria listed below:   1. Is GFR >/= 40 ml/min? no  Patient's GFR today is 16 2. Is urine output >/= 0.5 ml/kg/hr for the last 6 hours? no Patient's UOP is  ml/kg/hr 3. Is BUN < 60 mg/dL? yes  Patient's BUN today is 51 4. Abnormal electrolyte(s): K 3.4, Mag 1.3 5. Ordered repletion with: NA 6. If a panic level lab has been reported, has the CCM MD in charge been notified? yes.   Physician:  Dr Valeda Malm, Anasia Agro A 08/03/2013 4:45 AM

## 2013-08-03 NOTE — Progress Notes (Signed)
ANTIBIOTIC CONSULT NOTE - INITIAL  Pharmacy Consult for [facility] Indication: pneumonia/ UTI with immunosupression  Allergies  Allergen Reactions  . Codeine Other (See Comments)     drives me crazy  . Hydrocodone Other (See Comments)    Makes patient hallucinate, cannot sleep  . Oxycodone Other (See Comments)    Makes patient hallucinate, cannot sleep  . Penicillins Hives  . Sulfa Antibiotics Other (See Comments)    Per [facility]    Patient Measurements: Height: 5\' 3"  (160 cm) Weight: 172 lb 2.9 oz (78.1 kg) IBW/kg (Calculated) : 56.9 Adjusted Body Weight:    Vital Signs: Temp: 97.9 F (36.6 C) (11/06 0400) Temp src: Oral (11/06 0400) BP: 119/60 mmHg (11/06 0700) Pulse Rate: 73 (11/06 0700) Intake/Output from previous day: 11/05 0701 - 11/06 0700 In: 1741.9 [I.V.:1491.9; IV Piggyback:250] Out: 5775 [Urine:5775] Intake/Output from this shift:    Labs:  Recent Labs  08/01/13 0355 08/01/13 1920 08/02/13 0818 08/03/13 0400  WBC 7.2 8.7  --  7.0  HGB 7.8* 10.8*  --  10.3*  PLT 162 162  --  136*  CREATININE 3.71*  --  3.40* 3.34*   Estimated Creatinine Clearance: 15.5 ml/min (by C-G formula based on Cr of 3.34). No results found for this basename: VANCOTROUGH, VANCOPEAK, VANCORANDOM, GENTTROUGH, GENTPEAK, GENTRANDOM, TOBRATROUGH, TOBRAPEAK, TOBRARND, AMIKACINPEAK, AMIKACINTROU, AMIKACIN,  in the last 72 hours   Microbiology: Recent Results (from the past 720 hour(s))  CULTURE, BLOOD (ROUTINE X 2)     Status: None   Collection Time    07/31/13  1:57 PM      Result Value Range Status   Specimen Description BLOOD LEFT HAND   Final   Special Requests BOTTLES DRAWN AEROBIC ONLY   Final   Culture  Setup Time     Final   Value: 07/31/2013 22:47     Performed at [facility]   Culture     Final   Value:        BLOOD CULTURE RECEIVED NO GROWTH TO DATE CULTURE WILL BE HELD FOR 5 DAYS BEFORE ISSUING A FINAL NEGATIVE REPORT     Performed at [facility]    Report Status PENDING   Incomplete  URINE CULTURE     Status: None   Collection Time    07/31/13  2:06 PM      Result Value Range Status   Specimen Description URINE, CATHETERIZED   Final   Special Requests NONE   Final   Culture  Setup Time     Final   Value: 07/31/2013 16:30     Performed at [facility] Count     Final   Value: 5,000 COLONIES/ML     Performed at [facility]   Culture     Final   Value: INSIGNIFICANT GROWTH     Performed at [facility]   Report Status 08/02/2013 FINAL   Final  CULTURE, BLOOD (ROUTINE X 2)     Status: None   Collection Time    07/31/13  3:51 PM      Result Value Range Status   Specimen Description BLOOD LEFT ANTECUBITAL   Final   Special Requests BOTTLES DRAWN AEROBIC ONLY   Final   Culture  Setup Time     Final   Value: 07/31/2013 22:46     Performed at [facility]   Culture     Final   Value:  BLOOD CULTURE RECEIVED NO GROWTH TO DATE CULTURE WILL BE HELD FOR 5 DAYS BEFORE ISSUING A FINAL NEGATIVE REPORT     Performed at [facility]   Report Status PENDING   Incomplete  MRSA PCR SCREENING     Status: None   Collection Time    07/31/13  7:16 PM      Result Value Range Status   MRSA by PCR NEGATIVE  NEGATIVE Final   Comment:            The GeneXpert MRSA Assay (FDA     approved for NASAL specimens     only), is one component of a     comprehensive MRSA colonization     surveillance program. It is not     intended to diagnose MRSA     infection nor to guide or     monitor treatment for     MRSA infections.    Medical History: Past Medical History  Diagnosis Date  . Hypertension   . Arthritis   . Ex-smoker   . Rheumatoid arthritis(714.0)   . Pulmonary nodule   . DDD (degenerative disc disease)   . Complication of anesthesia     " I am difficult to wake up "  . Chronic kidney disease     acute 2013, 2014 on chronic stage 3.  . Anemia   . Proteus septicemia 02/2013   . Barrett's esophagus 04/2006    egd by dr Virginia Rochester.   . Colon polyp 04/2006    Hyperplastic on colonoscopy by Dr Virginia Rochester.   . Encephalopathy, metabolic 02/2013    Assessment: Vanc #4 for UTI (hx enterococcus UTI) - Afebrile, WBC 7, Scr 3.3 (baseline 1.6-1.8) - LA 0.74. UOP 3.1 last 24h. UCx -insig growth but u/a highly suggestive of uti so continue abx  Vanc 11/3>> CTX 11/3>>11/6 [name] 11/6>>  11/3 Blood - pending 11/3 Urine - 5000c/ml - insignificant growth   Plan:  D/c Rocephin Add Fortaz 1g IV q24h   Winner Valeriano S. Merilynn Finland, PharmD, [facility] Clinical Staff Pharmacist Pager [phone]  Misty Stanley Stillinger 08/03/2013,7:47 AM

## 2013-08-03 NOTE — Progress Notes (Signed)
     [facility] Daily Rounding Note 08/03/2013, 8:45 AM  LOS: 3 days   SUBJECTIVE:       Pt now intubated and triple lumen cath placed after brief trial of bipap for mgt of hypercarbic respiratory failure.   Rectal thermometer removed at which point some red blood observed.  No sponataneous hematochezia however  OBJECTIVE:         Vital signs in last 24 hours:    Temp:  [97.9 F (36.6 C)-98.4 F (36.9 C)] 97.9 F (36.6 C) (11/06 0400) Pulse Rate:  [43-134] 80 (11/06 0759) Resp:  [9-29] 18 (11/06 0759) BP: (84-165)/(51-85) 165/70 mmHg (11/06 0759) SpO2:  [94 %-100 %] 95 % (11/06 0700) Arterial Line BP: (129-205)/(57-99) 129/59 mmHg (11/06 0700) FiO2 (%):  [40 %-100 %] 40 % (11/06 0759) Last BM Date: 07/31/13 General: ill, sedated, intubated   Heart: RRR.  No mrg Chest: rales bil.  Breathing quietly on vent Abdomen: soft, ND, NT.  No mass or HSM Derm:   Ringworm in large patch on left thigh and into his back  Extremities: slight pedal edema, non-pitting.  Feet are warm Neuro/Psych:  Sedated:  Unable to assess.   Intake/Output from previous day: 11/05 0701 - 11/06 0700 In: 1741.9 [I.V.:1491.9; IV Piggyback:250] Out: 5775 [Urine:5775]  Intake/Output this shift:    Lab Results:  Recent Labs  08/01/13 0355 08/01/13 1920 08/03/13 0400  WBC 7.2 8.7 7.0  HGB 7.8* 10.8* 10.3*  HCT 23.7* 32.7* 31.8*  PLT 162 162 136*   BMET  Recent Labs  08/01/13 0355 08/02/13 0818 08/03/13 0400  NA 143 146* 147*  K 5.5* 4.6 3.4*  CL 116* 119* 112  CO2 18* 17* 24  GLUCOSE 119* 86 99  BUN 49* 50* 51*  CREATININE 3.71* 3.40* 3.34*  CALCIUM 8.8 8.5 8.4    Studies/Results: Dg Chest Port 1 View 08/03/2013    IMPRESSION: 1.  Stable positioning of support apparatus.  No pneumothorax. 2. Suspected mild worsening of pulmonary edema with otherwise unchanged small bilateral effusions and associated bibasilar opacities, right greater than left, atelectasis versus infiltrate.    Electronically Signed   By: Simonne Come M.D.   On: 08/03/2013 07:52    ASSESMENT:  *  Respiratory failure.  ETT/vent 08/02/2013  *  Hematochezia on transfer to stepdown 11/3, corresponds with placement of rectal thermometer.  No gross bleeding since.    *  ABL anemia. Transfused 2 units 11/4, stable Hgb since.   *  Hypothermia.   U/A + for UTI but culture with just 5 K colonies of unspecified type.  Likely PNA on top of CHF, suspect aspiration.   *  ARF, CKD  *  RA:   Chronic Prednisone 5 mg daily at home. Marland Kitchen   PLAN  *  No plans for colonoscopy or flex at this time. Replaced with Solumedrol for now.     [name]  08/03/2013, 8:45 AM Pager: [phone]

## 2013-08-03 NOTE — Progress Notes (Signed)
eLink Physician-Brief Progress Note Patient Name: [name] DOB: 04/23/1928 MRN: [id number]  Date of Service  08/03/2013   HPI/Events of Note     eICU Interventions  Mg replaced   Intervention Category Minor Interventions: Electrolytes abnormality - evaluation and management  [name] S. 08/03/2013, 5:15 AM

## 2013-08-03 NOTE — Progress Notes (Signed)
Rn calling elink   Restelss, RASS +2. Prn fentanyl not sufficient  Plan Diprivan gtt   Dr. Kalman Shan, M.D., [facility].C.P Pulmonary and Critical Care Medicine Staff Physician [place] [facility]  Pulmonary and Critical Care Pager: [phone], If no answer or between  15:00h - 7:00h: call [phone]  08/03/2013 9:51 PM

## 2013-08-03 NOTE — Progress Notes (Signed)
Patient seen, examined, and I agree with the above documentation, including the assessment and plan. Pt now intubated, received 2 u pRBC No further gross bleeding No plans for lower endoscopy at this time.  Follow Hgb.   Please call for any further significant bleeding, questions or concerns.

## 2013-08-03 NOTE — Progress Notes (Signed)
NUTRITION CONSULT/FOLLOW UP  Intervention:   Initiate Vital AF 1.2 formula at 15 ml/hr and increase by 10 ml every 4 hours to goal rate of 55 ml/hr to provide 1584 kcals, 99 gm protein, 1071 ml of free water RD to follow for nutrition care plan  Nutrition Dx:   Inadequate oral intake now related to inability to eat as evidenced by NPO status, ongoing  New Goal:   EN to meet > 90% of estimated nutrition needs, currently unmet  Monitor:   EN regimen & tolerance, respiratory status, weight, labs, I/O's  Assessment:   Patient with PMH of HTN, DDD, RA, mulitple UTI's brought in by his daughter for his SOB, productive cough and chills -- hypoxic upon arrival to ED; found to have acute on chronic renal failure, hypothermia and UTI.   Patient transferred to from 2C-Stepdown to 2S-Surgical for progressive respiratory declined.  Patient is currently intubated on ventilator support -- OGT in place  MV: 7.3 Temp: 36.6  Vital AF 1.2 formula to initiate per MD order via Adult Tube Feeding Protocol -- RD consulted for EN initiation & management.  Height: 07/31/13 5\' 5"  (1.651 m)   Weight Status:   Wt Readings from Last 1 Encounters:  07/31/13 172 lb 2.9 oz (78.1 kg)    BMI: Body mass index is 28.65 kg/(m^2).  Re-estimated needs:  Kcal: 1500-1650 Protein: 95-105 gm Fluid: per MD  Skin: Intact  Diet Order: NPO   Intake/Output Summary (Last 24 hours) at 08/03/13 1106 Last data filed at 08/03/13 1000  Gross per 24 hour  Intake 1589.75 ml  Output   5700 ml  Net -4110.25 ml    Labs:   Recent Labs Lab 08/01/13 0355 08/02/13 0818 08/02/13 1740 08/03/13 0400  NA 143 146*  --  147*  K 5.5* 4.6  --  3.4*  CL 116* 119*  --  112  CO2 18* 17*  --  24  BUN 49* 50*  --  51*  CREATININE 3.71* 3.40*  --  3.34*  CALCIUM 8.8 8.5  --  8.4  MG  --   --  1.5 1.3*  PHOS  --   --   --  3.1  GLUCOSE 119* 86  --  99    CBG (last 3)   Recent Labs  08/02/13 1930 08/02/13 2228  08/03/13 0811  GLUCAP 128* 122* 82    Scheduled Meds: . acetaminophen  650 mg Rectal Once  . cefTAZidime (FORTAZ)  IV  1 g Intravenous Q24H  . chlorhexidine  15 mL Mouth/Throat BID  . feeding supplement (RESOURCE BREEZE)  1 Container Oral TID BM  . feeding supplement (VITAL AF 1.2 CAL)  1,000 mL Per Tube Q24H  . furosemide  40 mg Intravenous Q12H  . methylPREDNISolone (SOLU-MEDROL) injection  30 mg Intravenous Daily  . pantoprazole (PROTONIX) IV  40 mg Intravenous Q24H  . sodium chloride  10 mL Intravenous Q12H  . sodium chloride  3 mL Intravenous Q12H  . tamsulosin  0.4 mg Oral QPC supper  . vancomycin  1,000 mg Intravenous Q48H    Continuous Infusions: . sodium chloride 20 mL/hr (08/03/13 1016)    Maureen Chatters, RD, LDN Pager #: [phone] After-Hours Pager #: [phone]

## 2013-08-03 NOTE — Progress Notes (Signed)
Lab review for ectopy earlier   PULMONARY  CBC  Recent Labs Lab 08/01/13 1920 08/03/13 0400 08/03/13 1800  HGB 10.8* 10.3* 11.0*  HCT 32.7* 31.8* 32.7*  WBC 8.7 7.0 7.2  PLT 162 136* 125*    COAGULATION No results found for this basename: INR,  in the last 168 hours  CARDIAC   Recent Labs Lab 08/02/13 1740 08/02/13 2320 08/03/13 0400  TROPONINI <0.30 <0.30 <0.30    Recent Labs Lab 08/02/13 1740  PROBNP 7170.0*     CHEMISTRY  Recent Labs Lab 07/31/13 1110 08/01/13 0355 08/02/13 0818 08/02/13 1740 08/03/13 0400 08/03/13 1810  NA 142 143 146*  --  147* 146*  K 4.7 5.5* 4.6  --  3.4* 3.9  CL 114* 116* 119*  --  112 108  CO2 14* 18* 17*  --  24 25  GLUCOSE 138* 119* 86  --  99 160*  BUN 48* 49* 50*  --  51* 52*  CREATININE 3.73* 3.71* 3.40*  --  3.34* 3.19*  CALCIUM 9.7 8.8 8.5  --  8.4 8.3*  MG  --   --   --  1.5 1.3* 1.8  PHOS  --   --   --   --  3.1 2.3   Estimated Creatinine Clearance: 16.2 ml/min (by C-G formula based on Cr of 3.19).  Intake/Output     11/06 0701 - 11/07 0700   I.V. (mL/kg) 186 (2.4)   NG/GT 255   IV Piggyback 50   Total Intake(mL/kg) 491 (6.3)   Urine (mL/kg/hr) 3400 (3)   Total Output 3400   Net -2909          A Mildly low mag Improving renal failure Making good urine  P 2gm mag sulfate   Dr. Kalman Shan, M.D., [facility].C.P Pulmonary and Critical Care Medicine Staff Physician [place] [facility] [place] [facility] Pager: [phone], If no answer or between  15:00h - 7:00h: call [phone]  08/03/2013 9:33 PM

## 2013-08-03 NOTE — Progress Notes (Signed)
eLink Physician-Brief Progress Note Patient Name: [name] DOB: 1928/08/31 MRN: [id number]  Date of Service  08/03/2013   HPI/Events of Note   Ectopy   eICU Interventions  Send liytes   Intervention Category Major Interventions: Arrhythmia - evaluation and management  [name]. 08/03/2013, 6:07 PM

## 2013-08-04 ENCOUNTER — Inpatient Hospital Stay (HOSPITAL_COMMUNITY): Payer: Medicare Other

## 2013-08-04 LAB — CBC WITH DIFFERENTIAL/PLATELET
Basophils Relative: 0 % (ref 0–1)
HCT: 32.6 % — ABNORMAL LOW (ref 39.0–52.0)
Hemoglobin: 10.9 g/dL — ABNORMAL LOW (ref 13.0–17.0)
MCHC: 33.4 g/dL (ref 30.0–36.0)
MCV: 87.6 fL (ref 78.0–100.0)
Monocytes Absolute: 0.8 10*3/uL (ref 0.1–1.0)
Monocytes Relative: 10 % (ref 3–12)
Neutro Abs: 5.9 10*3/uL (ref 1.7–7.7)
RDW: 17.2 % — ABNORMAL HIGH (ref 11.5–15.5)

## 2013-08-04 LAB — BASIC METABOLIC PANEL
BUN: 56 mg/dL — ABNORMAL HIGH (ref 6–23)
CO2: 29 mEq/L (ref 19–32)
Calcium: 8.5 mg/dL (ref 8.4–10.5)
Chloride: 104 mEq/L (ref 96–112)
GFR calc Af Amer: 20 mL/min — ABNORMAL LOW (ref >90)
Glucose, Bld: 122 mg/dL — ABNORMAL HIGH (ref 70–99)
Potassium: 3.6 mEq/L (ref 3.5–5.1)

## 2013-08-04 LAB — GLUCOSE, CAPILLARY
Glucose-Capillary: 111 mg/dL — ABNORMAL HIGH (ref 70–99)
Glucose-Capillary: 126 mg/dL — ABNORMAL HIGH (ref 70–99)
Glucose-Capillary: 127 mg/dL — ABNORMAL HIGH (ref 70–99)
Glucose-Capillary: 128 mg/dL — ABNORMAL HIGH (ref 70–99)
Glucose-Capillary: 148 mg/dL — ABNORMAL HIGH (ref 70–99)
Glucose-Capillary: 159 mg/dL — ABNORMAL HIGH (ref 70–99)

## 2013-08-04 LAB — POCT I-STAT 3, ART BLOOD GAS (G3+)
Acid-Base Excess: 7 mmol/L — ABNORMAL HIGH (ref 0.0–2.0)
Patient temperature: 97.4

## 2013-08-04 LAB — MAGNESIUM: Magnesium: 2.5 mg/dL (ref 1.5–2.5)

## 2013-08-04 MED ORDER — INSULIN ASPART 100 UNIT/ML ~~LOC~~ SOLN
0.0000 [IU] | SUBCUTANEOUS | Status: DC
  Administered 2013-08-04 (×2): 1 [IU] via SUBCUTANEOUS
  Administered 2013-08-07 (×2): 2 [IU] via SUBCUTANEOUS
  Administered 2013-08-08: 1 [IU] via SUBCUTANEOUS

## 2013-08-04 MED ORDER — FENTANYL CITRATE 0.05 MG/ML IJ SOLN
25.0000 ug | INTRAMUSCULAR | Status: DC | PRN
  Filled 2013-08-04: qty 2

## 2013-08-04 MED ORDER — METHYLPREDNISOLONE SODIUM SUCC 40 MG IJ SOLR
10.0000 mg | Freq: Every day | INTRAMUSCULAR | Status: DC
  Administered 2013-08-05 – 2013-08-07 (×3): 10 mg via INTRAVENOUS
  Filled 2013-08-04 (×3): qty 0.25

## 2013-08-04 MED ORDER — METOPROLOL TARTRATE 1 MG/ML IV SOLN
2.5000 mg | Freq: Four times a day (QID) | INTRAVENOUS | Status: DC
  Administered 2013-08-04 – 2013-08-07 (×12): 2.5 mg via INTRAVENOUS
  Filled 2013-08-04 (×15): qty 5

## 2013-08-04 MED ORDER — FUROSEMIDE 10 MG/ML IJ SOLN
40.0000 mg | Freq: Once | INTRAMUSCULAR | Status: AC
  Administered 2013-08-04: 40 mg via INTRAVENOUS

## 2013-08-04 NOTE — Progress Notes (Addendum)
Wasted 60ml propofol, rinsed down the sink, witnessed by second Charity fundraiser

## 2013-08-04 NOTE — Evaluation (Signed)
Clinical/Bedside Swallow Evaluation Patient Details  Name: [name] MRN: [id number] Date of Birth: 09/09/1928  Today's Date: 08/04/2013 Time: 1355-1430 SLP Time Calculation (min): 35 min  Past Medical History:  Past Medical History  Diagnosis Date  . Hypertension   . Arthritis   . Ex-smoker   . Rheumatoid arthritis(714.0)   . Pulmonary nodule   . DDD (degenerative disc disease)   . Complication of anesthesia     " I am difficult to wake up "  . Chronic kidney disease     acute 2013, 2014 on chronic stage 3.  . Anemia   . Proteus septicemia 02/2013  . Barrett's esophagus 04/2006    egd by dr Virginia Rochester.   . Colon polyp 04/2006    Hyperplastic on colonoscopy by Dr Virginia Rochester.   . Encephalopathy, metabolic 02/2013   Past Surgical History:  Past Surgical History  Procedure Laterality Date  . Joint replacement      bilateral knee replacement  . Hernia repair    . Back surgery      X 2  . Appendectomy     HPI:  [name] is a 79 y.o. male with prior h/o hypertension, DDD, RA, mulitple UTI'S in the past was brought in by his daughter for his SOB, and productive cough, chills. He was given breathing treatment by EMS and on arrival to ED he was found to be hypoxic. He was also found to be in acute on chronic renal failure, hypothermic and have a UTI.    Assessment / Plan / Recommendation Clinical Impression  Patient presents with a moderate oropharyngeal based dysphagia with impact from patient's current level of cognitive function (fluctuating alertness, not oriented) resulting in immediate as well as delayed cough with thin liquids from cup sip, and wet vocal quality following PO's of ice chips. Patient exhibited a non-productive cough, with audible secretions in pharynx, but not able to expectorate .    Aspiration Risk  Moderate    Diet Recommendation NPO        Other  Recommendations     Follow Up Recommendations       Frequency and Duration min 2x/week  2 weeks    Pertinent Vitals/Pain     SLP Swallow Goals   Patient will participate in PO trials with SLP only, to determine readiness for PO intake.  Swallow Study Prior Functional Status       General Date of Onset: 07/31/13 HPI: [name] is a 80 y.o. male with prior h/o hypertension, DDD, RA, mulitple UTI'S in the past was brought in by his daughter for his SOB, and productive cough, chills. He was given breathing treatment by EMS and on arrival to ED he was found to be hypoxic. He was also found to be in acute on chronic renal failure, hypothermic and have a UTI.  Type of Study: Bedside swallow evaluation Diet Prior to this Study: NPO Temperature Spikes Noted: No Respiratory Status: Nasal cannula History of Recent Intubation: Yes Length of Intubations (days): 2 days Date extubated: 08/04/13 Behavior/Cognition: Cooperative;Confused;Decreased sustained attention;Other (comment) (fluctuating alertness) Self-Feeding Abilities: Needs assist Patient Positioning: Upright in bed Baseline Vocal Quality: Wet Volitional Cough: Weak Volitional Swallow: Unable to elicit    Oral/Motor/Sensory Function Overall Oral Motor/Sensory Function: Appears within functional limits for tasks assessed   Ice Chips Ice chips: Impaired Oral Phase Functional Implications: Prolonged oral transit Pharyngeal Phase Impairments: Suspected delayed Swallow;Decreased hyoid-laryngeal movement;Throat Clearing - Delayed;Wet Vocal Quality   Thin Liquid  Thin Liquid: Impaired Presentation: Cup Pharyngeal  Phase Impairments: Suspected delayed Swallow;Decreased hyoid-laryngeal movement;Cough - Immediate;Cough - Delayed    Nectar Thick Nectar Thick Liquid: Not tested   Honey Thick Honey Thick Liquid: Not tested   Puree Puree: Not tested   Solid   GO    Solid: Not tested       Elio Forget Tarrell 08/04/2013,7:08 PM  Angela Nevin, MA, CCC-SLP [facility] Speech-Language Pathologist

## 2013-08-04 NOTE — Procedures (Signed)
Extubation Procedure Note  Patient Details:   Name: [name] DOB: 11-10-1927 MRN: [id number]   Airway Documentation:     Evaluation  O2 sats: stable throughout Complications: No apparent complications Patient did tolerate procedure well. Bilateral Breath Sounds: Rhonchi Suctioning: Airway Yes Patient tolerated wean. MD ordered to extubate. Positive for cuff leak. Patient extubated to a 3 Lpm nasal cannula. No signs of dyspnea or stridor. Patient resting comfortably. Will continue to monitor.  Ancil Boozer 08/04/2013, 10:08 AM

## 2013-08-04 NOTE — Progress Notes (Signed)
PULMONARY  / CRITICAL CARE MEDICINE  Name: [name] MRN: [id number] DOB: [birth year]    ADMISSION DATE:  07/31/2013 CONSULTATION DATE:  08/02/13  REFERRING MD : Glendon Axe PRIMARY SERVICE: PCCM  CHIEF COMPLAINT: Acute Hypercarbic Respiratory Failure requiring BIPAP.  BRIEF PATIENT DESCRIPTION:  79 yo male admitted with dyspnea, hypoxia, renal failure from UTI.  PCCM consulted to assist with respiratory management.  Has hx of COPD, RA on chronic prednisone, chronic hydronephrosis  SIGNIFICANT EVENTS: 11/03 Admit 11/04 Lower GI bleed, transfuse 2 units PRBC, GI consulted 11/05 Hypercapnia, failed BiPAP, transfer to ICU  STUDIES:  11/03 Renal u/s >> b/l hydronephrosis Rt > Lt  LINES / TUBES: 11/5 ETT>>> 11/5 Rt radial A line>>> 11/5 Lt [place] CVL>>>  CULTURES: 11/3 urine>>> insig growth 11/5 BC>>> 11/6 sputum>>>  ANTIBIOTICS: Rocephin 11/3>>> 11/05 Vancomycin 11/3>>> [name] 11/06 >>>  SUBJECTIVE:   Tolerating SBT.  VITAL SIGNS: Temp:  [97.8 F (36.6 C)-99.1 F (37.3 C)] 98.1 F (36.7 C) (11/07 0802) Pulse Rate:  [27-153] 86 (11/07 0900) Resp:  [14-25] 20 (11/07 0900) BP: (82-168)/(46-132) 138/98 mmHg (11/07 0900) SpO2:  [88 %-99 %] 94 % (11/07 0900) Arterial Line BP: (91-200)/(39-89) 183/78 mmHg (11/07 0900) FiO2 (%):  [40 %] 40 % (11/07 0900) Weight:  [169 lb 5 oz (76.8 kg)] 169 lb 5 oz (76.8 kg) (11/07 0500)  HEMODYNAMICS: CVP:  [0 mmHg-17 mmHg] 11 mmHg  VENTILATOR SETTINGS: Vent Mode:  [-] CPAP;PSV FiO2 (%):  [40 %] 40 % Set Rate:  [18 bmp] 18 bmp Vt Set:  [450 mL] 450 mL PEEP:  [5 cmH20] 5 cmH20 Pressure Support:  [5 cmH20] 5 cmH20 Plateau Pressure:  [13 cmH20-22 cmH20] 18 cmH20  INTAKE / OUTPUT: Intake/Output     11/06 0701 - 11/07 0700 11/07 0701 - 11/08 0700   I.V. (mL/kg) 445.6 (5.8) 50.7 (0.7)   NG/GT 765 140   IV Piggyback 100 50   Total Intake(mL/kg) 1310.6 (17.1) 240.7 (3.1)   Urine (mL/kg/hr) 5450 (3) 525 (2.7)   Total Output  5450 525   Net -4139.4 -284.3        Stool Occurrence  1 x     PHYSICAL EXAMINATION: General: Agitated with WUA Neuro: Follows commands HEENT: ETT, OG in place Cardiovascular:  regular Lungs: basilar rales Abdomen: soft, non tender Musculoskeletal: 1+ edema Skin: no rashes  LABS:  CBC Recent Labs     08/03/13  0400  08/03/13  1800  08/04/13  0410  WBC  7.0  7.2  7.6  HGB  10.3*  11.0*  10.9*  HCT  31.8*  32.7*  32.6*  PLT  136*  125*  146*   BMET Recent Labs     08/03/13  0400  08/03/13  1810  08/04/13  0410  NA  147*  146*  145  K  3.4*  3.9  3.6  CL  112  108  104  CO2  24  25  29   BUN  51*  52*  56*  CREATININE  3.34*  3.19*  3.05*  GLUCOSE  99  160*  122*   Electrolytes Recent Labs     08/03/13  0400  08/03/13  1810  08/04/13  0410  CALCIUM  8.4  8.3*  8.5  MG  1.3*  1.8  2.5  PHOS  3.1  2.3  2.3   Sepsis Markers Recent Labs     08/03/13  0400  PROCALCITON  0.12   ABG Recent Labs  08/02/13  2200  08/03/13  0333  08/04/13  0421  PHART  7.251*  7.336*  7.502*  PCO2ART  51.2*  46.1*  38.8  PO2ART  444.0*  114.0*  90.0   Cardiac Enzymes Recent Labs     08/02/13  1740  08/02/13  2320  08/03/13  0400  TROPONINI  <0.30  <0.30  <0.30  PROBNP  7170.0*   --    --    Glucose Recent Labs     08/03/13  0811  08/03/13  1632  08/03/13  1953 08/04/13  08/04/13  0355  08/04/13  0759  GLUCAP  82  135*  146*  159*  113*  126*    Imaging Dg Chest Port 1 View  08/04/2013   CLINICAL DATA:  Pulmonary edema  EXAM: PORTABLE CHEST - 1 VIEW  COMPARISON:  08/03/2013  FINDINGS: An endotracheal tube is again identified and stable from the prior exam. A nasogastric catheter is noted within the stomach. The left central venous line is noted with the tip deep in the right atrium. The cardiac shadow is stable. Bibasilar atelectatic changes are again identified. Mild central vascular congestion remains no new focal abnormality is seen.  IMPRESSION: When  compared with the prior exam, no significant interval change is noted.   Electronically Signed   By: Alcide Clever M.D.   On: 08/04/2013 07:44   Dg Chest Port 1 View  08/03/2013   CLINICAL DATA:  Evaluate endotracheal tube positioning  EXAM: PORTABLE CHEST - 1 VIEW  COMPARISON:  08/02/2013; 07/31/2013  FINDINGS: Unchanged cardiac silhouette and mediastinal contours given persistently reduced lung volumes and patient rotation. Post median sternotomy and CABG. Stable position of support apparatus. No pneumothorax. Pulmonary vasculature is less distinct on the present examination with cephalization of flow. Grossly unchanged small bilateral pleural effusions with associated bibasilar heterogeneous opacities, right greater than left. No new focal airspace opacities. Unchanged bones.  IMPRESSION: 1.  Stable positioning of support apparatus.  No pneumothorax. 2. Suspected mild worsening of pulmonary edema with otherwise unchanged small bilateral effusions and associated bibasilar opacities, right greater than left, atelectasis versus infiltrate.   Electronically Signed   By: Simonne Come M.D.   On: 08/03/2013 07:52   Dg Chest Port 1 View  08/02/2013   CLINICAL DATA:  Evaluate endotracheal tube and central line placement.  EXAM: PORTABLE CHEST - 1 VIEW  COMPARISON:  Chest x-ray 08/02/2013.  FINDINGS: An endotracheal tube is in place with tip 3.2 cm above the carina. A nasogastric tube is seen extending into the stomach, however, the tip of the nasogastric tube extends below the lower margin of the image. There is a left-sided subclavian central venous catheter with tip terminating in the superior cavoatrial junction. Lung volumes are low. Persistent bibasilar opacities may reflect areas of atelectasis and/or consolidation. Small left and small to moderate right pleural effusions are unchanged. Mild crowding of the pulmonary vasculature, without frank pulmonary edema. Mild cardiomegaly. The patient is rotated to the  left on today's exam, resulting in distortion of the mediastinal contours and reduced diagnostic sensitivity and specificity for mediastinal pathology. Atherosclerosis in the thoracic aorta. Status post median sternotomy.  IMPRESSION: 1. Support apparatus, as above. 2. Improving aeration, likely related to resolving pulmonary edema. 3. Persistent bibasilar atelectasis and/or consolidation with small to moderate right and small left pleural effusions.   Electronically Signed   By: Trudie Reed M.D.   On: 08/02/2013 22:11    ASSESSMENT / PLAN:  PULMONARY A: Acute Hypercarbic Respiratory Failure 2nd to ATX, pleural effusion, and possible aspiration. Reported hx of COPD. P:   -proceed with extubation 11/7 -f/u CXR -BD's prn -oxygen to keep SpO2 > 92% -BiPAP prn after extubation  CARDIOVASCULAR A: Volume overload. Hx of HTN. P:  -continue lasix with goal negative fluid balance -d/c aline -IV lopressor until able to swallow -hold ASA in setting of GI bleed until okay with GI  RENAL A:   Acute on Chronic Renal Failure. Chronic Bilateral Hydronephrosis. P: -monitor renal fx, urine outpt, electrolytes  GASTROINTESTINAL A:   Hematochezia - while on lovenox. Hx of Barrett's esophagus. Dysphagia. P:   -protonix for SUP -speech to assess swallow after extubation -NPO until assessed by speech  HEMATOLOGIC A: Acute Blood Loss anemia  2nd to GI bleed P:  -SCD for DVT prevention -f/u CBC  INFECTIOUS A:  Recurrent UTI with hx of chronic hydronephrosis. Possible aspiration pneumonia. P:   -continue fortaz, vancomycin pending cx results  ENDOCRINE A:   Hyperglycemia. P:   -SSI  NEUROLOGIC A:  AMS 2/2 Hypercarbia >> improved 11/7 P:   -prn fentanyl   RHEUMATOLOGY A: Hx of Rheumatoid arthritis. P: -continue IV solumedrol until able to take oral prednisone (baseline dose 5 mg daily)  Goals of Care Patient has a high quality of life prior to admit and per his  daughter, would want short term life support measures if necessary for a reversible process.  CC time 40 minutes.  Coralyn Helling, MD [facility] Pulmonary/Critical Care 08/04/2013, 9:59 AM Pager:  [phone] After 3pm call: [phone]

## 2013-08-05 ENCOUNTER — Inpatient Hospital Stay (HOSPITAL_COMMUNITY): Payer: Medicare Other

## 2013-08-05 DIAGNOSIS — M069 Rheumatoid arthritis, unspecified: Secondary | ICD-10-CM

## 2013-08-05 LAB — BASIC METABOLIC PANEL
Calcium: 8.5 mg/dL (ref 8.4–10.5)
Creatinine, Ser: 2.93 mg/dL — ABNORMAL HIGH (ref 0.50–1.35)
GFR calc Af Amer: 21 mL/min — ABNORMAL LOW (ref >90)
GFR calc non Af Amer: 18 mL/min — ABNORMAL LOW (ref >90)
Glucose, Bld: 88 mg/dL (ref 70–99)
Sodium: 145 mEq/L (ref 135–145)

## 2013-08-05 LAB — CBC
Hemoglobin: 12.7 g/dL — ABNORMAL LOW (ref 13.0–17.0)
MCH: 29.5 pg (ref 26.0–34.0)
Platelets: 167 10*3/uL (ref 150–400)
RBC: 4.31 MIL/uL (ref 4.22–5.81)
RDW: 16.8 % — ABNORMAL HIGH (ref 11.5–15.5)
WBC: 11.2 10*3/uL — ABNORMAL HIGH (ref 4.0–10.5)

## 2013-08-05 LAB — MAGNESIUM: Magnesium: 2.3 mg/dL (ref 1.5–2.5)

## 2013-08-05 LAB — GLUCOSE, CAPILLARY
Glucose-Capillary: 68 mg/dL — ABNORMAL LOW (ref 70–99)
Glucose-Capillary: 127 mg/dL — ABNORMAL HIGH (ref 70–99)
Glucose-Capillary: 95 mg/dL (ref 70–99)
Glucose-Capillary: 112 mg/dL — ABNORMAL HIGH (ref 70–99)

## 2013-08-05 LAB — CULTURE, RESPIRATORY W GRAM STAIN: Special Requests: NORMAL

## 2013-08-05 LAB — PROCALCITONIN: Procalcitonin: 0.15 ng/mL

## 2013-08-05 MED ORDER — POTASSIUM CHLORIDE 10 MEQ/100ML IV SOLN
10.0000 meq | INTRAVENOUS | Status: AC
  Administered 2013-08-05 (×3): 10 meq via INTRAVENOUS
  Filled 2013-08-05: qty 100

## 2013-08-05 MED ORDER — DEXTROSE 50 % IV SOLN
25.0000 mL | Freq: Once | INTRAVENOUS | Status: AC
  Administered 2013-08-05: 25 mL via INTRAVENOUS

## 2013-08-05 MED ORDER — DEXTROSE 50 % IV SOLN
INTRAVENOUS | Status: AC
  Filled 2013-08-05: qty 50

## 2013-08-05 NOTE — Progress Notes (Signed)
Pt CBG 68, 25 cc D50% given IV, pt NPO, repeat CBG 127

## 2013-08-05 NOTE — Progress Notes (Signed)
Report called to 6E RN

## 2013-08-05 NOTE — Progress Notes (Signed)
[facility] ADULT ICU REPLACEMENT PROTOCOL FOR AM LAB REPLACEMENT ONLY  The patient does not apply for the [facility] Adult ICU Electrolyte Replacment Protocol based on the criteria listed below:   1. Is GFR >/= 40 ml/min? no  Patient's GFR today is 18 2. Is urine output >/= 0.5 ml/kg/hr for the last 6 hours? no Patient's UOP is  ml/kg/hr 3. Is BUN < 60 mg/dL? no  Patient's BUN today is 60 4. Abnormal electrolyte(s):K 3.3 5. Ordered repletion with: NA 6. If a panic level lab has been reported, has the CCM MD in charge been notified? yes.   Physician:  Dr Jennette Dubin, Marthe Dant A 08/05/2013 6:11 AM

## 2013-08-05 NOTE — Plan of Care (Signed)
Problem: Phase II Progression Outcomes Goal: Date pt extubated/weaned off vent 08/04/2013

## 2013-08-05 NOTE — Progress Notes (Signed)
Attempted to call report to 6E 

## 2013-08-05 NOTE — Progress Notes (Signed)
Family left message about transfer to [phone]

## 2013-08-05 NOTE — Progress Notes (Addendum)
PULMONARY  / CRITICAL CARE MEDICINE  Name: [name] MRN: [id number] DOB: 1927/12/09    ADMISSION DATE:  07/31/2013 CONSULTATION DATE:  08/02/13  REFERRING MD : Glendon Axe PRIMARY SERVICE: PCCM  CHIEF COMPLAINT: Acute Hypercarbic Respiratory Failure requiring BIPAP.  BRIEF PATIENT DESCRIPTION:  79 yo male admitted with dyspnea, hypoxia, renal failure from UTI.  PCCM consulted to assist with respiratory management.  Has hx of COPD, RA on chronic prednisone, chronic hydronephrosis  SIGNIFICANT EVENTS: 11/03 Admit 11/04 Lower GI bleed, transfuse 2 units PRBC, GI consulted 11/05 Hypercapnia, failed BiPAP, transfer to ICU  STUDIES:  11/03 Renal u/s >> b/l hydronephrosis Rt > Lt  LINES / TUBES: 11/5 ETT>>>11/7 11/5 Rt radial A line>>>11/7 11/5 Lt [place] CVL>>>  CULTURES: 11/3 urine>>> insig growth 11/5 BC>>>neg 11/6 sputum>>>neg  ANTIBIOTICS: Rocephin 11/3>>> 11/05 Vancomycin 11/3>>>11/8 [name] 11/06 >>>  SUBJECTIVE:   extub well 11/7 no overnight issues  VITAL SIGNS: Temp:  [97.6 F (36.4 C)-98.2 F (36.8 C)] 97.7 F (36.5 C) (11/08 0807) Pulse Rate:  [42-81] 68 (11/08 1100) Resp:  [9-22] 22 (11/08 1100) BP: (105-171)/(55-91) 108/57 mmHg (11/08 1100) SpO2:  [93 %-100 %] 100 % (11/08 1100) Arterial Line BP: (149-183)/(65-80) 149/65 mmHg (11/07 1500) Weight:  [72.303 kg (159 lb 6.4 oz)] 72.303 kg (159 lb 6.4 oz) (11/08 0500)  HEMODYNAMICS: CVP:  [1 mmHg-9 mmHg] 1 mmHg      INTAKE / OUTPUT: Intake/Output     11/07 0701 - 11/08 0700 11/08 0701 - 11/09 0700   I.V. (mL/kg) 454.4 (6.3) 20 (0.3)   NG/GT 195    IV Piggyback 250 350   Total Intake(mL/kg) 899.4 (12.4) 370 (5.1)   Urine (mL/kg/hr) 4915 (2.8) 235 (0.8)   Total Output 4915 235   Net -4015.6 +135        Stool Occurrence 2 x      PHYSICAL EXAMINATION: General: alert, extubated  Neuro: Follows commands Cardiovascular:  regular Lungs: clear Abdomen: soft, non tender Musculoskeletal:no  edema Skin: no rashes  LABS:  CBC Recent Labs     08/03/13  1800  08/04/13  0410  08/05/13  0438  WBC  7.2  7.6  11.2*  HGB  11.0*  10.9*  12.7*  HCT  32.7*  32.6*  38.0*  PLT  125*  146*  167   BMET Recent Labs     08/03/13  1810  08/04/13  0410  08/05/13  0438  NA  146*  145  145  K  3.9  3.6  3.3*  CL  108  104  95*  CO2  25  29  37*  BUN  52*  56*  60*  CREATININE  3.19*  3.05*  2.93*  GLUCOSE  160*  122*  88   Electrolytes Recent Labs     08/03/13  0400  08/03/13  1810  08/04/13  0410  08/05/13  0438  CALCIUM  8.4  8.3*  8.5  8.5  MG  1.3*  1.8  2.5  2.3  PHOS  3.1  2.3  2.3   --    Sepsis Markers Recent Labs     08/03/13  0400  08/05/13  0438  PROCALCITON  0.12  0.15   ABG Recent Labs     08/02/13  2200  08/03/13  0333  08/04/13  0421  PHART  7.251*  7.336*  7.502*  PCO2ART  51.2*  46.1*  38.8  PO2ART  444.0*  114.0*  90.0  Cardiac Enzymes Recent Labs     08/02/13  1740  08/02/13  2320  08/03/13  0400  TROPONINI  <0.30  <0.30  <0.30  PROBNP  7170.0*   --    --    Glucose Recent Labs     08/04/13  1351  08/04/13  1519  08/04/13  1955  08/05/13  0412  08/05/13  0804  08/05/13  0835  GLUCAP  128*  148*  111*  79  68*  127*    Imaging Dg Chest Port 1 View  08/05/2013   CLINICAL DATA:  Respiratory failure and pleural effusions.  EXAM: PORTABLE CHEST - 1 VIEW  COMPARISON:  08/04/2013  FINDINGS: Interval extubation and removal of nasogastric tube. Left-sided central line shows stable positioning. Lungs show improved aeration bilaterally with no edema remaining. Mild atelectasis present at the right lung base. Less prominent bilateral pleural effusions appear small in volume. The heart size is stable.  IMPRESSION: Improved aeration, diminished appearance of bilateral pleural effusions and no significant residual pulmonary edema.   Electronically Signed   By: Irish Lack M.D.   On: 08/05/2013 09:22   Dg Chest Port 1  View  08/04/2013   CLINICAL DATA:  Pulmonary edema  EXAM: PORTABLE CHEST - 1 VIEW  COMPARISON:  08/03/2013  FINDINGS: An endotracheal tube is again identified and stable from the prior exam. A nasogastric catheter is noted within the stomach. The left central venous line is noted with the tip deep in the right atrium. The cardiac shadow is stable. Bibasilar atelectatic changes are again identified. Mild central vascular congestion remains no new focal abnormality is seen.  IMPRESSION: When compared with the prior exam, no significant interval change is noted.   Electronically Signed   By: Alcide Clever M.D.   On: 08/04/2013 07:44    ASSESSMENT / PLAN:  PULMONARY A: Acute Hypercarbic Respiratory Failure 2nd to ATX, pleural effusion, and possible aspiration. Successful extubation 11/7 Reported hx of COPD. P:   -BD's prn -oxygen to keep SpO2 > 92%   CARDIOVASCULAR A: Volume overload. Hx of HTN. P:  -continue lasix with goal negative fluid balance -IV lopressor until able to swallow -hold ASA in setting of GI bleed until okay with GI  RENAL A:   Acute on Chronic Renal Failure. Chronic Bilateral Hydronephrosis. P: -monitor renal fx, urine outpt, electrolytes  GASTROINTESTINAL A:   Hematochezia - while on lovenox. Hx of Barrett's esophagus. Dysphagia. P:   -protonix for SUP -speech to assess swallow after extubation -NPO until assessed by speech  HEMATOLOGIC A: Acute Blood Loss anemia  2nd to GI bleed P:  -SCD for DVT prevention -f/u CBC  INFECTIOUS A:  Recurrent UTI with hx of chronic hydronephrosis. Possible aspiration pneumonia. P:   -continue fortaz,  -d/c vancomycin  ENDOCRINE A:   Hyperglycemia. P:   -SSI  NEUROLOGIC A:  AMS 2/2 Hypercarbia >> improved 11/7 P:   -prn fentanyl   RHEUMATOLOGY A: Hx of Rheumatoid arthritis. P: -continue IV solumedrol until able to take oral prednisone (baseline dose 5 mg daily)  Goals of Care Patient has a high  quality of life prior to admit and per his daughter, would want short term life support measures if necessary for a reversible process.  Transfer to floor today  CC time 35 minutes.  [name] Beeper  [phone]  Cell  [phone]  If no response or cell goes to voicemail, call beeper [phone] [facility] 08/05/2013, 11:14 AM

## 2013-08-05 NOTE — Progress Notes (Signed)
Speech Language Pathology Treatment: Dysphagia  Patient Details Name: [name] MRN: [id number] DOB: [birth year] Today's Date: 08/05/2013 Time: 1210-1240 SLP Time Calculation (min): 30 min  Assessment / Plan / Recommendation Clinical Impression  Pt more alert and responsive today. Oral care completed with suction. Pt was observed with trials of puree, thin, and nectar thick consistencies.  Immediate cough response noted after thin liquids.  Delayed cough noted after completion of trials.  Pt is improved today, however, continues to be inappropriate for po intake at this time.  Objective study recommended prior to initiation of po intake.  ST to continue to monitor for improvement and readiness for po intake.   HPI HPI: [name] is a 79 y.o. male with prior h/o hypertension, DDD, RA, mulitple UTI'S in the past was brought in by his daughter for his SOB, and productive cough, chills. He was given breathing treatment by EMS and on arrival to ED he was found to be hypoxic. He was also found to be in acute on chronic renal failure, hypothermic and have a UTI.  Pt was extubated 08/04/13. BSE initially revealed pt unsafe for po intake.   Pertinent Vitals VSS  SLP Plan   Diagnostic tx for po readiness.   Recommendations Diet recommendations: NPO Medication Administration: Via alternative means                   GO   [name] B. Murvin Natal [facility], CCC-SLP [phone]  Leigh Aurora 08/05/2013, 1:46 PM

## 2013-08-05 NOTE — Progress Notes (Signed)
Transferred to [id number] via WC, monitor and O2, RN to receive in room, SCD's with pt

## 2013-08-06 DIAGNOSIS — N139 Obstructive and reflux uropathy, unspecified: Secondary | ICD-10-CM

## 2013-08-06 LAB — CULTURE, BLOOD (ROUTINE X 2): Culture: NO GROWTH

## 2013-08-06 LAB — CBC WITH DIFFERENTIAL/PLATELET
Basophils Absolute: 0 10*3/uL (ref 0.0–0.1)
Eosinophils Relative: 6 % — ABNORMAL HIGH (ref 0–5)
HCT: 41.2 % (ref 39.0–52.0)
Hemoglobin: 13.4 g/dL (ref 13.0–17.0)
Lymphocytes Relative: 18 % (ref 12–46)
Lymphs Abs: 1.8 10*3/uL (ref 0.7–4.0)
MCV: 91.6 fL (ref 78.0–100.0)
Monocytes Absolute: 1.2 10*3/uL — ABNORMAL HIGH (ref 0.1–1.0)
Neutro Abs: 6.5 10*3/uL (ref 1.7–7.7)
Neutrophils Relative %: 64 % (ref 43–77)
RBC: 4.5 MIL/uL (ref 4.22–5.81)
RDW: 16.7 % — ABNORMAL HIGH (ref 11.5–15.5)
WBC: 10.1 10*3/uL (ref 4.0–10.5)

## 2013-08-06 LAB — GLUCOSE, CAPILLARY
Glucose-Capillary: 106 mg/dL — ABNORMAL HIGH (ref 70–99)
Glucose-Capillary: 79 mg/dL (ref 70–99)
Glucose-Capillary: 81 mg/dL (ref 70–99)
Glucose-Capillary: 82 mg/dL (ref 70–99)
Glucose-Capillary: 83 mg/dL (ref 70–99)
Glucose-Capillary: 93 mg/dL (ref 70–99)

## 2013-08-06 LAB — BASIC METABOLIC PANEL
CO2: 32 mEq/L (ref 19–32)
Calcium: 8.8 mg/dL (ref 8.4–10.5)
Chloride: 99 mEq/L (ref 96–112)
Creatinine, Ser: 2.69 mg/dL — ABNORMAL HIGH (ref 0.50–1.35)
GFR calc Af Amer: 24 mL/min — ABNORMAL LOW (ref >90)
Potassium: 3.5 mEq/L (ref 3.5–5.1)
Sodium: 146 mEq/L — ABNORMAL HIGH (ref 135–145)

## 2013-08-06 MED ORDER — ENOXAPARIN SODIUM 30 MG/0.3ML ~~LOC~~ SOLN
30.0000 mg | SUBCUTANEOUS | Status: DC
  Administered 2013-08-06 – 2013-08-08 (×3): 30 mg via SUBCUTANEOUS
  Filled 2013-08-06 (×3): qty 0.3

## 2013-08-06 NOTE — Progress Notes (Signed)
Speech Language Pathology Treatment: Dysphagia  Patient Details Name: [name] MRN: [id number] DOB: 07-Sep-1928 Today's Date: 08/06/2013 Time: 1730-1800 SLP Time Calculation (min): 30 min  Assessment / Plan / Recommendation Clinical Impression  F/u for PO readiness vs. Readiness to complete MBS.  Patient observed with PO trials of thin water, nectar thick liquids, and puree consistency. Continued delayed s/s of penetration vs. Aspiration s/p all consistencies.   Proceed with MBS to assess risk for aspiration and determine safest, PO diet due to continued s/s present.  Defer MBS to   08/07/13.   HPI HPI: 79 year old male with history of hypertension, TOPD, rheumatoid arthritis on chronic prednisone, recurrent UTIs, CKD and anemia admitted with acute onset of dyspnea with hypoxia, acute on chronic renal failure secondary to UTI. He was also hypothermic. His intermittent step down unit and found to have episode of hematochezia with clots. Patient started on empiric antibiotics. Patient was transfused 2 units PRBC pediatric GI was consulted. On 11/5 patient was hypercapnic, failed BiPAP and was transferred to ICU he was intubated. Patient successfully extubated on 11/7. Cultures have so far been negative. A renal ultrasound done showed bilateral hydronephrosis ( Rt>Lt)      SLP Plan  MBS    Recommendations Diet recommendations: NPO Medication Administration: Via alternative means              Oral Care Recommendations: Oral care Q4 per protocol Follow up Recommendations: Skilled Nursing facility Plan: MBS    GO    Moreen Fowler MS, CCC-SLP [phone] [facility] 08/06/2013, 6:43 PM

## 2013-08-06 NOTE — Progress Notes (Addendum)
[facility] PROGRESS NOTE  [name] YQM:[id number] DOB: Aug 14, 1928 DOA: 07/31/2013 PCP: Jon Patch, MD   Brief narrative 80 year old male with history of hypertension, TOPD, rheumatoid arthritis on chronic prednisone, recurrent UTIs, CKD and anemia admitted with acute onset of dyspnea with hypoxia, acute on chronic renal failure secondary to UTI. He was also hypothermic. His intermittent step down unit and found to have episode of hematochezia with clots. Patient started on empiric antibiotics. Patient was transfused 2 units PRBC pediatric GI was consulted. On 11/5 patient was hypercapnic, failed BiPAP and was transferred to ICU he was intubated. Patient successfully extubated on 11/7. Cultures have so far been negative. A renal ultrasound done showed bilateral hydronephrosis ( Rt>Lt)  Assessment/Plan: Acute hypercarbic respiratory failure CXR showed atelectasis and b/l pleural effusion. Possible aspiration pneumonia versus CHF that  could be the cause. Patient intubated and transferred to ICU. Now extubated and transferred to medical floor. And has reported history of COPD as well. Continue O2 via nasal cannula  Currently n.p.o. due to poor swallow reflex Speech and swallow falling Continue nebulizer.  Recurrent UTI and possible aspiration pneumonia Continue Fortaz. Vancomycin discontinued.  Acute CHF 2-D echo about 2 months back without clear LV  Function. Elevated pro BNP. i will repeat 2D echo monitor I/O  Chronic bilateral hydronephrosis . Patient supposed to see [name] as outpatient upon previous discharge. He was discharged on chronic foley. i will discuss with him tomorrow Patient has recurrent UTIs.  Acute on chronic kidney disease baseline creatinine 1.6-2. Monitor renal function closely. Acute kidney injury likely in the setting of obstructive uropapthy and ? Cardiorenal syndrome.  rheumatoid arthritis On chronic prednisone. Continue iv  solumedrol  Hematochezia  no further bleeding. Received 2 U prbc Seen by [facility]. Hb stable now  Diet: NPO. continue IV fluids. Pending repeat S&S assessment   Code Status: FULL CODE as per daughter Family Communication: None at bedside Disposition Plan: SNF   Consultants:  PCCM  Procedures:  11/5 ETT>>>11/7  11/5 Rt radial A line>>>11/7  11/5 Lt [place] CVL>>>   Antibiotics: Rocephin 11/3>>> 11/05  Vancomycin 11/3>>>11/8  [name] 11/06    HPI/Subjective: Patient seen and examined this morning. Denies any specific complaints but appears quite frail.  Objective: Filed Vitals:   08/06/13 0928  BP: 125/78  Pulse: 72  Temp: 97.6 F (36.4 C)  Resp: 18    Intake/Output Summary (Last 24 hours) at 08/06/13 1409 Last data filed at 08/06/13 0700  Gross per 24 hour  Intake     40 ml  Output    100 ml  Net    -60 ml   Filed Weights   07/31/13 1908 08/04/13 0500 08/05/13 0500  Weight: 78.1 kg (172 lb 2.9 oz) 76.8 kg (169 lb 5 oz) 72.303 kg (159 lb 6.4 oz)    Exam:   General:  Elderly  male lying in bed in no acute distress. Appears frail  HEENT: No pallor, moist oral mucosa  Chest: Scattered rhonchi bilaterally, no added sounds  CVS: Normal S1 and S2, no murmurs rub or gallop  Abdomen: Soft, nontender, nondistended, bowel sounds present  Extremities: Warm, no edema, has a condom catheter.   CNS: AAO x3  Data Reviewed: Basic Metabolic Panel:  Recent Labs Lab 08/02/13 1740 08/03/13 0400 08/03/13 1810 08/04/13 0410 08/05/13 0438 08/06/13 0541  NA  --  147* 146* 145 145 146*  K  --  3.4* 3.9 3.6 3.3* 3.5  CL  --  112 108  104 95* 99  CO2  --  24 25 29  37* 32  GLUCOSE  --  99 160* 122* 88 83  BUN  --  51* 52* 56* 60* 59*  CREATININE  --  3.34* 3.19* 3.05* 2.93* 2.69*  CALCIUM  --  8.4 8.3* 8.5 8.5 8.8  MG 1.5 1.3* 1.8 2.5 2.3  --   PHOS  --  3.1 2.3 2.3  --   --    Liver Function Tests: No results found for this basename: AST, ALT, ALKPHOS,  BILITOT, PROT, ALBUMIN,  in the last 168 hours No results found for this basename: LIPASE, AMYLASE,  in the last 168 hours No results found for this basename: AMMONIA,  in the last 168 hours CBC:  Recent Labs Lab 08/03/13 0400 08/03/13 1800 08/04/13 0410 08/05/13 0438 08/06/13 0541  WBC 7.0 7.2 7.6 11.2* 10.1  NEUTROABS  --   --  5.9  --  6.5  HGB 10.3* 11.0* 10.9* 12.7* 13.4  HCT 31.8* 32.7* 32.6* 38.0* 41.2  MCV 90.3 88.1 87.6 88.2 91.6  PLT 136* 125* 146* 167 162   Cardiac Enzymes:  Recent Labs Lab 08/02/13 1740 08/02/13 2320 08/03/13 0400  TROPONINI <0.30 <0.30 <0.30   BNP (last 3 results)  Recent Labs  01/03/13 0552 04/27/13 1455 08/02/13 1740  PROBNP 602.9* 3460.0* 7170.0*   CBG:  Recent Labs Lab 08/05/13 2021 08/06/13 0009 08/06/13 0402 08/06/13 0734 08/06/13 1142  GLUCAP 112* 82 81 83 93    Recent Results (from the past 240 hour(s))  CULTURE, BLOOD (ROUTINE X 2)     Status: None   Collection Time    07/31/13  1:57 PM      Result Value Range Status   Specimen Description BLOOD LEFT HAND   Final   Special Requests BOTTLES DRAWN AEROBIC ONLY   Final   Culture  Setup Time     Final   Value: 07/31/2013 22:47     Performed at [facility]   Culture     Final   Value: NO GROWTH 5 DAYS     Performed at [facility]   Report Status 08/06/2013 FINAL   Final  URINE CULTURE     Status: None   Collection Time    07/31/13  2:06 PM      Result Value Range Status   Specimen Description URINE, CATHETERIZED   Final   Special Requests NONE   Final   Culture  Setup Time     Final   Value: 07/31/2013 16:30     Performed at [facility] Count     Final   Value: 5,000 COLONIES/ML     Performed at [facility]   Culture     Final   Value: INSIGNIFICANT GROWTH     Performed at [facility]   Report Status 08/02/2013 FINAL   Final  CULTURE, BLOOD (ROUTINE X 2)     Status: None   Collection Time     07/31/13  3:51 PM      Result Value Range Status   Specimen Description BLOOD LEFT ANTECUBITAL   Final   Special Requests BOTTLES DRAWN AEROBIC ONLY   Final   Culture  Setup Time     Final   Value: 07/31/2013 22:46     Performed at [facility]   Culture     Final   Value: NO GROWTH 5 DAYS  Performed at [facility]   Report Status 08/06/2013 FINAL   Final  MRSA PCR SCREENING     Status: None   Collection Time    07/31/13  7:16 PM      Result Value Range Status   MRSA by PCR NEGATIVE  NEGATIVE Final   Comment:            The GeneXpert MRSA Assay (FDA     approved for NASAL specimens     only), is one component of a     comprehensive MRSA colonization     surveillance program. It is not     intended to diagnose MRSA     infection nor to guide or     monitor treatment for     MRSA infections.  CULTURE, BLOOD (ROUTINE X 2)     Status: None   Collection Time    08/02/13  5:35 PM      Result Value Range Status   Specimen Description BLOOD RIGHT HAND   Final   Special Requests BOTTLES DRAWN AEROBIC ONLY 5CC   Final   Culture  Setup Time     Final   Value: 08/03/2013 01:57     Performed at [facility]   Culture     Final   Value:        BLOOD CULTURE RECEIVED NO GROWTH TO DATE CULTURE WILL BE HELD FOR 5 DAYS BEFORE ISSUING A FINAL NEGATIVE REPORT     Performed at [facility]   Report Status PENDING   Incomplete  CULTURE, BLOOD (ROUTINE X 2)     Status: None   Collection Time    08/02/13  5:40 PM      Result Value Range Status   Specimen Description BLOOD LEFT HAND   Final   Special Requests BOTTLES DRAWN AEROBIC ONLY 5CC   Final   Culture  Setup Time     Final   Value: 08/03/2013 01:57     Performed at [facility]   Culture     Final   Value:        BLOOD CULTURE RECEIVED NO GROWTH TO DATE CULTURE WILL BE HELD FOR 5 DAYS BEFORE ISSUING A FINAL NEGATIVE REPORT     Performed at [facility]   Report Status PENDING    Incomplete  CULTURE, RESPIRATORY (NON-EXPECTORATED)     Status: None   Collection Time    08/03/13 11:57 AM      Result Value Range Status   Specimen Description TRACHEAL ASPIRATE   Final   Special Requests Normal   Final   Gram Stain     Final   Value: MODERATE WBC PRESENT,BOTH PMN AND MONONUCLEAR     NO SQUAMOUS EPITHELIAL CELLS SEEN     NO ORGANISMS SEEN     Performed at [facility]   Culture     Final   Value: Non-Pathogenic Oropharyngeal-type Flora Isolated.     Performed at [facility]   Report Status 08/05/2013 FINAL   Final     Studies: Dg Chest Port 1 View  08/05/2013   CLINICAL DATA:  Respiratory failure and pleural effusions.  EXAM: PORTABLE CHEST - 1 VIEW  COMPARISON:  08/04/2013  FINDINGS: Interval extubation and removal of nasogastric tube. Left-sided central line shows stable positioning. Lungs show improved aeration bilaterally with no edema remaining. Mild atelectasis present at the right lung base. Less prominent bilateral pleural effusions appear small in  volume. The heart size is stable.  IMPRESSION: Improved aeration, diminished appearance of bilateral pleural effusions and no significant residual pulmonary edema.   Electronically Signed   By: Irish Lack M.D.   On: 08/05/2013 09:22    Scheduled Meds: . cefTAZidime (FORTAZ)  IV  1 g Intravenous Q24H  . chlorhexidine  15 mL Mouth/Throat BID  . insulin aspart  0-9 Units Subcutaneous Q4H  . methylPREDNISolone (SOLU-MEDROL) injection  10 mg Intravenous Daily  . metoprolol  2.5 mg Intravenous Q6H  . pantoprazole (PROTONIX) IV  40 mg Intravenous Q24H  . sodium chloride  10 mL Intravenous Q12H  . sodium chloride  3 mL Intravenous Q12H  . tamsulosin  0.4 mg Oral QPC supper   Continuous Infusions: . sodium chloride 20 mL/hr at 08/04/13 0400      Time spent: 25 minutes    Lelani Garnett  [facility] Pager [phone]. If 7PM-7AM, please contact night-coverage at [email or web address],  password [facility] 08/06/2013, 2:09 PM  LOS: 6 days

## 2013-08-06 NOTE — Progress Notes (Signed)
PHARMACY NOTE  Pharmacy Consult for :  [name] Indication:  Asp PNA  Hospital Problems Active Problems:   HYPERTENSION   Rheumatoid arthritis(714.0)   COPD (chronic obstructive pulmonary disease)   CKD (chronic kidney disease) stage 3, GFR 30-59 ml/min   UTI (urinary tract infection)   Obstructive uropathy/chronic bilateral hydronephrosis   Hematochezia   Acute blood loss anemia   Acute renal failure   Hypothermia  Weight: 72 kg  Vitals: BP 125/78  Pulse 72  Temp(Src) 97.6 F (36.4 C) (Oral)  Resp 18  Ht 5\' 3"  (1.6 m)  Wt 159 lb 6.4 oz (72.303 kg)  BMI 28.24 kg/m2  SpO2 93%  Labs:  Recent Labs  08/04/13 0410 08/05/13 0438 08/06/13 0541  WBC 7.6 11.2* 10.1  HGB 10.9* 12.7* 13.4  PLT 146* 167 162  CREATININE 3.05* 2.93* 2.69*   Estimated Creatinine Clearance: 18.6 ml/min (by C-G formula based on Cr of 2.69).   Microbiology: Recent Results (from the past 720 hour(s))  CULTURE, BLOOD (ROUTINE X 2)     Status: None   Collection Time    07/31/13  1:57 PM      Result Value Range Status   Specimen Description BLOOD LEFT HAND   Final   Special Requests BOTTLES DRAWN AEROBIC ONLY   Final   Culture  Setup Time     Final   Value: 07/31/2013 22:47     Performed at [facility]   Culture     Final   Value: NO GROWTH 5 DAYS     Performed at [facility]   Report Status 08/06/2013 FINAL   Final  URINE CULTURE     Status: None   Collection Time    07/31/13  2:06 PM      Result Value Range Status   Specimen Description URINE, CATHETERIZED   Final   Special Requests NONE   Final   Culture  Setup Time     Final   Value: 07/31/2013 16:30     Performed at [facility] Count     Final   Value: 5,000 COLONIES/ML     Performed at [facility]   Culture     Final   Value: INSIGNIFICANT GROWTH     Performed at [facility]   Report Status 08/02/2013 FINAL   Final  CULTURE, BLOOD (ROUTINE X 2)      Status: None   Collection Time    07/31/13  3:51 PM      Result Value Range Status   Specimen Description BLOOD LEFT ANTECUBITAL   Final   Special Requests BOTTLES DRAWN AEROBIC ONLY   Final   Culture  Setup Time     Final   Value: 07/31/2013 22:46     Performed at [facility]   Culture     Final   Value: NO GROWTH 5 DAYS     Performed at [facility]   Report Status 08/06/2013 FINAL   Final  MRSA PCR SCREENING     Status: None   Collection Time    07/31/13  7:16 PM      Result Value Range Status   MRSA by PCR NEGATIVE  NEGATIVE Final   Comment:            The GeneXpert MRSA Assay (FDA     approved for NASAL specimens     only), is one component of a  comprehensive MRSA colonization     surveillance program. It is not     intended to diagnose MRSA     infection nor to guide or     monitor treatment for     MRSA infections.  CULTURE, BLOOD (ROUTINE X 2)     Status: None   Collection Time    08/02/13  5:35 PM      Result Value Range Status   Specimen Description BLOOD RIGHT HAND   Final   Special Requests BOTTLES DRAWN AEROBIC ONLY 5CC   Final   Culture  Setup Time     Final   Value: 08/03/2013 01:57     Performed at [facility]   Culture     Final   Value:        BLOOD CULTURE RECEIVED NO GROWTH TO DATE CULTURE WILL BE HELD FOR 5 DAYS BEFORE ISSUING A FINAL NEGATIVE REPORT     Performed at [facility]   Report Status PENDING   Incomplete  CULTURE, BLOOD (ROUTINE X 2)     Status: None   Collection Time    08/02/13  5:40 PM      Result Value Range Status   Specimen Description BLOOD LEFT HAND   Final   Special Requests BOTTLES DRAWN AEROBIC ONLY 5CC   Final   Culture  Setup Time     Final   Value: 08/03/2013 01:57     Performed at [facility]   Culture     Final   Value:        BLOOD CULTURE RECEIVED NO GROWTH TO DATE CULTURE WILL BE HELD FOR 5 DAYS BEFORE ISSUING A FINAL NEGATIVE REPORT     Performed at [facility]   Report Status PENDING   Incomplete  CULTURE, RESPIRATORY (NON-EXPECTORATED)     Status: None   Collection Time    08/03/13 11:57 AM      Result Value Range Status   Specimen Description TRACHEAL ASPIRATE   Final   Special Requests Normal   Final   Gram Stain     Final   Value: MODERATE WBC PRESENT,BOTH PMN AND MONONUCLEAR     NO SQUAMOUS EPITHELIAL CELLS SEEN     NO ORGANISMS SEEN     Performed at [facility]   Culture     Final   Value: Non-Pathogenic Oropharyngeal-type Flora Isolated.     Performed at [facility]   Report Status 08/05/2013 FINAL   Final    Anti-infectives Anti-infectives   Start     Dose/Rate Route Frequency Ordered Stop   08/03/13 0900  cefTAZidime (FORTAZ) 1 g in dextrose 5 % 50 mL IVPB     1 g 100 mL/hr over 30 Minutes Intravenous Every 24 hours 08/03/13 0754     07/31/13 1715  vancomycin (VANCOCIN) IVPB 1000 mg/200 mL premix  Status:  Discontinued     1,000 mg 200 mL/hr over 60 Minutes Intravenous Every 24 hours 07/31/13 1700 07/31/13 1703   07/31/13 1715  vancomycin (VANCOCIN) IVPB 1000 mg/200 mL premix  Status:  Discontinued     1,000 mg 200 mL/hr over 60 Minutes Intravenous Every 48 hours 07/31/13 1703 08/05/13 1120   07/31/13 1700  cefTRIAXone (ROCEPHIN) 1 g in dextrose 5 % 50 mL IVPB  Status:  Discontinued     1 g 100 mL/hr over 30 Minutes Intravenous Every 24 hours 07/31/13 1655 08/03/13 0725      Assessment:  Day # 4 Ceftazidime for treatment of Asp PNA.  WBC 10.1, Afebrile.  SPO2 93% Room Air. Renal function with CrCl < 20 ml/min.  Goal of Therapy:   Resolution of symptoms. Antibiotics selected for infection/cultures and adjusted for renal function.   Plan:   Continue Ceftazidime 1 gm IV q 24 hours. Follow up SCr, UOP, cultures, clinical course and adjust as clinically indicated.   Laurena Bering, Pharm.D.  08/06/2013 2:05 PM

## 2013-08-06 NOTE — Evaluation (Signed)
Physical Therapy Evaluation Patient Details Name: [name] MRN: [id number] DOB: [birth year] Today's Date: 08/06/2013 Time: 2440-1027 PT Time Calculation (min): 24 min  PT Assessment / Plan / Recommendation History of Present Illness  79 y.o. male with PMHx of COPD, recurrent UTIs and recent treatment for cellulitis who had called the EMS reportedly for shortness of breath. However it is of note the patient denies ever having shortness of breath. His main complaint was of not feeling well overall with some pain in his lower extremities  Clinical Impression  Pt adm due to the above, presents with limitations in functional mobility secondary to deficits listed below (see PT problem list). Pt to benefit from skilled PT in acute setting to address deficits below and increase independence with functional mobility. Pt to benefit from ST SNF upon acute D/C if his daughter is unable to provide 24/7(A). Pt agreeable to ST SNF at this time, reports he has gone to rehab before but uncertain where.    PT Assessment  Patient needs continued PT services    Follow Up Recommendations  SNF;Supervision/Assistance - 24 hour    Does the patient have the potential to tolerate intense rehabilitation      Barriers to Discharge Decreased caregiver support per pt- his daughter is not with him 24/7    Equipment Recommendations  Other (comment) (TBD)    Recommendations for Other Services OT consult   Frequency Min 3X/week    Precautions / Restrictions Precautions Precautions: Fall Restrictions Weight Bearing Restrictions: No   Pertinent Vitals/Pain During transfer pt on RA O2 stats at 87%; returned to 3L O2 and pt recovered to 92%-- RN notified      Mobility  Bed Mobility Bed Mobility: Rolling Left;Left Sidelying to Sit Rolling Left: 5: Supervision;With rail Left Sidelying to Sit: 5: Supervision;HOB elevated;With rails Details for Bed Mobility Assistance: pt requries increased time to come to  sitting EOB; cues for hand placement and sequencing  Transfers Transfers: Sit to Stand;Stand to Sit;Stand Pivot Transfers Sit to Stand: 3: Mod assist;From bed;With upper extremity assist Stand to Sit: 3: Mod assist;To chair/3-in-1;With armrests;With upper extremity assist Stand Pivot Transfers: 3: Mod assist;From elevated surface;With armrests Details for Transfer Assistance: pt unsteady with transfers; requires cues for hand placement and management of RW; pt became impulsive and attempted to sit in chair prior to reaching chair secondary to fatigue  Ambulation/Gait Ambulation/Gait Assistance: Not tested (comment) Stairs: No Wheelchair Mobility Wheelchair Mobility: No    Exercises General Exercises - Lower Extremity Ankle Circles/Pumps: AROM;Both;10 reps;Seated   PT Diagnosis: Difficulty walking;Acute pain;Generalized weakness  PT Problem List: Decreased strength;Decreased activity tolerance;Decreased balance;Decreased mobility;Decreased cognition;Decreased knowledge of use of DME;Decreased safety awareness;Pain PT Treatment Interventions: DME instruction;Gait training;Functional mobility training;Therapeutic activities;Balance training;Therapeutic exercise;Neuromuscular re-education;Patient/family education     PT Goals(Current goals can be found in the care plan section) Acute Rehab PT Goals Patient Stated Goal: to get better PT Goal Formulation: With patient Time For Goal Achievement: 08/20/13 Potential to Achieve Goals: Good  Visit Information  Last PT Received On: 08/06/13 Assistance Needed: +1 History of Present Illness: 79 y.o. male with PMHx of COPD, recurrent UTIs and recent treatment for cellulitis who had called the EMS reportedly for shortness of breath. However it is of note the patient denies ever having shortness of breath. His main complaint was of not feeling well overall with some pain in his lower extremities       Prior Functioning  Home Living Family/patient  expects to be discharged to::  Private residence Living Arrangements: Children Available Help at Discharge: Family;Available PRN/intermittently Type of Home: House Home Access: Ramped entrance Home Layout: One level Home Equipment: Walker - 2 wheels Additional Comments: per pt he was not on home oxygen Prior Function Level of Independence: Needs assistance Gait / Transfers Assistance Needed: pt able to amb with RW throughout house without (A) from daughter  ADL's / Homemaking Assistance Needed: per pt he takes sponge baths and can clean himself but requires setup (A); daughter is responsible for homemaking   Communication Communication: HOH;Expressive difficulties Dominant Hand: Right    Cognition  Cognition Arousal/Alertness: Awake/alert Behavior During Therapy: WFL for tasks assessed/performed Overall Cognitive Status: No family/caregiver present to determine baseline cognitive functioning    Extremity/Trunk Assessment Upper Extremity Assessment Upper Extremity Assessment: Defer to OT evaluation Lower Extremity Assessment Lower Extremity Assessment: Generalized weakness Cervical / Trunk Assessment Cervical / Trunk Assessment: Kyphotic   Balance Balance Balance Assessed: Yes Static Sitting Balance Static Sitting - Balance Support: Feet supported;No upper extremity supported Static Sitting - Level of Assistance: 5: Stand by assistance Static Sitting - Comment/# of Minutes: pt tolerated sitting EOB ~ 5 min to prepare for transfer   End of Session PT - End of Session Equipment Utilized During Treatment: Gait belt;Oxygen Activity Tolerance: Patient limited by fatigue Patient left: in chair;with call bell/phone within reach Nurse Communication: Mobility status;Other (comment) (O2 stats)  GP     Donell Sievert, [place] [phone] 08/06/2013, 10:52 AM

## 2013-08-07 ENCOUNTER — Inpatient Hospital Stay (HOSPITAL_COMMUNITY): Payer: Medicare Other

## 2013-08-07 DIAGNOSIS — R5381 Other malaise: Secondary | ICD-10-CM

## 2013-08-07 DIAGNOSIS — I517 Cardiomegaly: Secondary | ICD-10-CM

## 2013-08-07 LAB — GLUCOSE, CAPILLARY
Glucose-Capillary: 69 mg/dL — ABNORMAL LOW (ref 70–99)
Glucose-Capillary: 195 mg/dL — ABNORMAL HIGH (ref 70–99)
Glucose-Capillary: 75 mg/dL (ref 70–99)
Glucose-Capillary: 154 mg/dL — ABNORMAL HIGH (ref 70–99)
Glucose-Capillary: 175 mg/dL — ABNORMAL HIGH (ref 70–99)

## 2013-08-07 LAB — CREATININE, SERUM: Creatinine, Ser: 2.71 mg/dL — ABNORMAL HIGH (ref 0.50–1.35)

## 2013-08-07 MED ORDER — CAMPHOR-MENTHOL 0.5-0.5 % EX LOTN
TOPICAL_LOTION | CUTANEOUS | Status: DC | PRN
  Administered 2013-08-07: 1 via TOPICAL
  Filled 2013-08-07: qty 222

## 2013-08-07 MED ORDER — PANTOPRAZOLE SODIUM 40 MG PO TBEC
40.0000 mg | DELAYED_RELEASE_TABLET | Freq: Every day | ORAL | Status: DC
  Administered 2013-08-08: 40 mg via ORAL
  Filled 2013-08-07: qty 1

## 2013-08-07 MED ORDER — DEXTROSE 50 % IV SOLN
25.0000 mL | Freq: Once | INTRAVENOUS | Status: AC | PRN
  Administered 2013-08-07: 25 mL via INTRAVENOUS

## 2013-08-07 MED ORDER — PREDNISONE 5 MG PO TABS
5.0000 mg | ORAL_TABLET | Freq: Every day | ORAL | Status: DC
  Administered 2013-08-08: 5 mg via ORAL
  Filled 2013-08-07 (×2): qty 1

## 2013-08-07 MED ORDER — DEXTROSE 50 % IV SOLN
INTRAVENOUS | Status: AC
  Administered 2013-08-07: 25 mL via INTRAVENOUS
  Filled 2013-08-07: qty 50

## 2013-08-07 MED ORDER — DIPHENHYDRAMINE HCL 50 MG/ML IJ SOLN
12.5000 mg | Freq: Four times a day (QID) | INTRAMUSCULAR | Status: DC | PRN
  Administered 2013-08-07 (×4): 12.5 mg via INTRAVENOUS
  Filled 2013-08-07 (×3): qty 1

## 2013-08-07 MED ORDER — METOPROLOL TARTRATE 25 MG PO TABS
25.0000 mg | ORAL_TABLET | Freq: Two times a day (BID) | ORAL | Status: DC
  Administered 2013-08-07 – 2013-08-08 (×2): 25 mg via ORAL
  Filled 2013-08-07 (×4): qty 1

## 2013-08-07 MED ORDER — ENSURE PUDDING PO PUDG
1.0000 | Freq: Three times a day (TID) | ORAL | Status: DC
  Administered 2013-08-07 – 2013-08-08 (×4): 1 via ORAL

## 2013-08-07 MED ORDER — DIPHENHYDRAMINE HCL 50 MG/ML IJ SOLN
INTRAMUSCULAR | Status: AC
  Filled 2013-08-07: qty 1

## 2013-08-07 MED ORDER — RESOURCE THICKENUP CLEAR PO POWD
ORAL | Status: DC | PRN
  Filled 2013-08-07: qty 125

## 2013-08-07 NOTE — Progress Notes (Addendum)
[facility] PROGRESS NOTE  [name] UJW:[id number] DOB: August 24, 1928 DOA: 07/31/2013 PCP: [name], MD  Brief narrative  79 year old male with history of hypertension, TOPD, rheumatoid arthritis on chronic prednisone, recurrent UTIs, CKD and anemia admitted with acute onset of dyspnea with hypoxia, acute on chronic renal failure secondary to UTI. He was also hypothermic. His intermittent step down unit and found to have episode of hematochezia with clots. Patient started on empiric antibiotics. Patient was transfused 2 units PRBC pediatric GI was consulted. On 11/5 patient was hypercapnic, failed BiPAP and was transferred to ICU he was intubated. Patient successfully extubated on 11/7. Cultures have so far been negative. A renal ultrasound done showed bilateral hydronephrosis ( Rt>Lt).  Assessment/Plan:  Acute hypercarbic respiratory failure  CXR showed atelectasis and b/l pleural effusion. Possible aspiration pneumonia versus CHF that could be the cause. Patient intubated and transferred to ICU. Now extubated and transferred to medical floor.  - has reported history of COPD as well.  Continue O2 via nasal cannula  -n.p.o. due to poor swallow reflex . Had MBS doen today. Started on dys level 3 diet. Continue nebulizer.   Recurrent UTI and possible aspiration pneumonia  Continue Fortaz. Vancomycin discontinued. Day 7 of abx. Will treat for 10 days. Urine cx on admission with now growth.  Acute CHF  2-D echo about 2 months back without clear LV Function. Elevated pro BNP. i will repeat 2D echo  monitor I/O   Chronic bilateral hydronephrosis  Patient has recurrent UTIs. Was on foley upon previous dsicharge. As per daughter [name] , he saw Dr Perley Jain after  Last hospital discharge and his foley was removed. Has good UOP now. Will follow with him as outpt. continue flomax   Acute on chronic kidney disease  baseline creatinine 1.6-2. Monitor renal function closely. Acute kidney  injury likely in the setting of obstructive uropapthy and ? Cardiorenal syndrome.   rheumatoid arthritis  On chronic prednisone. Placed on  iv solumedrol due to dysphagia. Will switch to po.  Hematochezia  no further bleeding. Received 2 U prbc  Seen by [facility]. Hb stable now   Diet: dys level 3  Code Status: FULL CODE as per daughter  Family Communication: discussed with [name] ( daughter ) over the phone) Disposition Plan: SNF . Likely on 11/11 if bed available  Consultants:  PCCM   Procedures:  11/5 ETT>>>11/7  11/5 Rt radial A line>>>11/7  11/5 Lt [place] CVL>>>   Antibiotics:  Rocephin 11/3>>> 11/05  Vancomycin 11/3>>>11/8   [name] 11/06  HPI/Subjective:  Patient seen and examined this morning. Informs feeling hungry.  Objective: Filed Vitals:   08/07/13 0910  BP: 133/73  Pulse: 83  Temp: 97.9 F (36.6 C)  Resp: 18    Intake/Output Summary (Last 24 hours) at 08/07/13 1125 Last data filed at 08/07/13 0300  Gross per 24 hour  Intake    160 ml  Output    200 ml  Net    -40 ml   Filed Weights   08/04/13 0500 08/05/13 0500 08/06/13 2021  Weight: 76.8 kg (169 lb 5 oz) 72.303 kg (159 lb 6.4 oz) 68.1 kg (150 lb 2.1 oz)    Exam:  General: Elderly male lying in bed in no acute distress. Appears frail  HEENT: No pallor, moist oral mucosa  Chest: Scattered rhonchi bilaterally, no added sounds  CVS: Normal S1 and S2, no murmurs rub or gallop  Abdomen: Soft, nontender, nondistended, bowel sounds present  Extremities: Warm, no edema,  has a condom catheter.  CNS: AAO x3   Data Reviewed: Basic Metabolic Panel:  Recent Labs Lab 08/02/13 1740 08/03/13 0400 08/03/13 1810 08/04/13 0410 08/05/13 0438 08/06/13 0541 08/07/13 0540  NA  --  147* 146* 145 145 146*  --   K  --  3.4* 3.9 3.6 3.3* 3.5  --   CL  --  112 108 104 95* 99  --   CO2  --  24 25 29  37* 32  --   GLUCOSE  --  99 160* 122* 88 83  --   BUN  --  51* 52* 56* 60* 59*  --   CREATININE  --  3.34*  3.19* 3.05* 2.93* 2.69* 2.71*  CALCIUM  --  8.4 8.3* 8.5 8.5 8.8  --   MG 1.5 1.3* 1.8 2.5 2.3  --   --   PHOS  --  3.1 2.3 2.3  --   --   --    Liver Function Tests: No results found for this basename: AST, ALT, ALKPHOS, BILITOT, PROT, ALBUMIN,  in the last 168 hours No results found for this basename: LIPASE, AMYLASE,  in the last 168 hours No results found for this basename: AMMONIA,  in the last 168 hours CBC:  Recent Labs Lab 08/03/13 0400 08/03/13 1800 08/04/13 0410 08/05/13 0438 08/06/13 0541  WBC 7.0 7.2 7.6 11.2* 10.1  NEUTROABS  --   --  5.9  --  6.5  HGB 10.3* 11.0* 10.9* 12.7* 13.4  HCT 31.8* 32.7* 32.6* 38.0* 41.2  MCV 90.3 88.1 87.6 88.2 91.6  PLT 136* 125* 146* 167 162   Cardiac Enzymes:  Recent Labs Lab 08/02/13 1740 08/02/13 2320 08/03/13 0400  TROPONINI <0.30 <0.30 <0.30   BNP (last 3 results)  Recent Labs  01/03/13 0552 04/27/13 1455 08/02/13 1740  PROBNP 602.9* 3460.0* 7170.0*   CBG:  Recent Labs Lab 08/06/13 2011 08/07/13 0006 08/07/13 0403 08/07/13 0432 08/07/13 0738  GLUCAP 79 76 69* 195* 75    Recent Results (from the past 240 hour(s))  CULTURE, BLOOD (ROUTINE X 2)     Status: None   Collection Time    07/31/13  1:57 PM      Result Value Range Status   Specimen Description BLOOD LEFT HAND   Final   Special Requests BOTTLES DRAWN AEROBIC ONLY   Final   Culture  Setup Time     Final   Value: 07/31/2013 22:47     Performed at [facility]   Culture     Final   Value: NO GROWTH 5 DAYS     Performed at [facility]   Report Status 08/06/2013 FINAL   Final  URINE CULTURE     Status: None   Collection Time    07/31/13  2:06 PM      Result Value Range Status   Specimen Description URINE, CATHETERIZED   Final   Special Requests NONE   Final   Culture  Setup Time     Final   Value: 07/31/2013 16:30     Performed at [facility] Count     Final   Value: 5,000 COLONIES/ML     Performed at  [facility]   Culture     Final   Value: INSIGNIFICANT GROWTH     Performed at [facility]   Report Status 08/02/2013 FINAL   Final  CULTURE, BLOOD (ROUTINE X 2)  Status: None   Collection Time    07/31/13  3:51 PM      Result Value Range Status   Specimen Description BLOOD LEFT ANTECUBITAL   Final   Special Requests BOTTLES DRAWN AEROBIC ONLY   Final   Culture  Setup Time     Final   Value: 07/31/2013 22:46     Performed at [facility]   Culture     Final   Value: NO GROWTH 5 DAYS     Performed at [facility]   Report Status 08/06/2013 FINAL   Final  MRSA PCR SCREENING     Status: None   Collection Time    07/31/13  7:16 PM      Result Value Range Status   MRSA by PCR NEGATIVE  NEGATIVE Final   Comment:            The GeneXpert MRSA Assay (FDA     approved for NASAL specimens     only), is one component of a     comprehensive MRSA colonization     surveillance program. It is not     intended to diagnose MRSA     infection nor to guide or     monitor treatment for     MRSA infections.  CULTURE, BLOOD (ROUTINE X 2)     Status: None   Collection Time    08/02/13  5:35 PM      Result Value Range Status   Specimen Description BLOOD RIGHT HAND   Final   Special Requests BOTTLES DRAWN AEROBIC ONLY 5CC   Final   Culture  Setup Time     Final   Value: 08/03/2013 01:57     Performed at [facility]   Culture     Final   Value:        BLOOD CULTURE RECEIVED NO GROWTH TO DATE CULTURE WILL BE HELD FOR 5 DAYS BEFORE ISSUING A FINAL NEGATIVE REPORT     Performed at [facility]   Report Status PENDING   Incomplete  CULTURE, BLOOD (ROUTINE X 2)     Status: None   Collection Time    08/02/13  5:40 PM      Result Value Range Status   Specimen Description BLOOD LEFT HAND   Final   Special Requests BOTTLES DRAWN AEROBIC ONLY 5CC   Final   Culture  Setup Time     Final   Value: 08/03/2013 01:57     Performed at [facility]   Culture     Final   Value:        BLOOD CULTURE RECEIVED NO GROWTH TO DATE CULTURE WILL BE HELD FOR 5 DAYS BEFORE ISSUING A FINAL NEGATIVE REPORT     Performed at [facility]   Report Status PENDING   Incomplete  CULTURE, RESPIRATORY (NON-EXPECTORATED)     Status: None   Collection Time    08/03/13 11:57 AM      Result Value Range Status   Specimen Description TRACHEAL ASPIRATE   Final   Special Requests Normal   Final   Gram Stain     Final   Value: MODERATE WBC PRESENT,BOTH PMN AND MONONUCLEAR     NO SQUAMOUS EPITHELIAL CELLS SEEN     NO ORGANISMS SEEN     Performed at [facility]   Culture     Final   Value: Non-Pathogenic Oropharyngeal-type Flora Isolated.  Performed at [facility]   Report Status 08/05/2013 FINAL   Final     Studies: Dg Swallowing Func-speech Pathology  08/07/2013   Breck Coons [place], CCC-SLP     08/07/2013 10:44 AM Objective Swallowing Evaluation: Modified Barium Swallowing Study   Patient Details  Name: [name] MRN: [id number] Date of Birth: Jan 18, 1928  Today's Date: 08/07/2013 Time: 1000-1021 SLP Time Calculation (min): 21 min  Past Medical History:  Past Medical History  Diagnosis Date  . Hypertension   . Arthritis   . Ex-smoker   . Rheumatoid arthritis(714.0)   . Pulmonary nodule   . DDD (degenerative disc disease)   . Complication of anesthesia     " I am difficult to wake up "  . Chronic kidney disease     acute 2013, 2014 on chronic stage 3.  . Anemia   . Proteus septicemia 02/2013  . Barrett's esophagus 04/2006    egd by dr Virginia Rochester.   . Colon polyp 04/2006    Hyperplastic on colonoscopy by Dr Virginia Rochester.   . Encephalopathy, metabolic 02/2013   Past Surgical History:  Past Surgical History  Procedure Laterality Date  . Joint replacement      bilateral knee replacement  . Hernia repair    . Back surgery      X 2  . Appendectomy     HPI:  79 year old male with history of hypertension, TOPD, rheumatoid  arthritis on chronic  prednisone, recurrent UTIs, CKD and anemia  admitted with acute onset of dyspnea with hypoxia, acute on  chronic renal failure secondary to UTI. He was also hypothermic.  His intermittent step down unit and found to have episode of  hematochezia with clots. Patient started on empiric antibiotics.  Patient was transfused 2 units PRBC pediatric GI was consulted.  On 11/5 patient was hypercapnic, failed BiPAP and was transferred  to ICU he was intubated. Patient successfully extubated on 11/7.  Cultures have so far been negative. A renal ultrasound done  showed bilateral hydronephrosis ( Rt>Lt)     Assessment / Plan / Recommendation Clinical Impression  Dysphagia Diagnosis: Mild oral phase dysphagia;Mild pharyngeal  phase dysphagia;Moderate pharyngeal phase dysphagia Clinical impression: Mild oral dysphagia described with motor  deficits leading to piecemeal swallow pattern with mild lingual  residue.  Mild-moderate sensorimotor pharyngeal dysphagia with  delayed swallow initiation and decreased laryngeal elevation and  closure with thin.  Penetration with thin consistency both flash  as well as remaining in vestibule slightly above vocal cords  without sensation.  Mild vallecular and pyriform sinus residue  was reduced with verbal cues for double swallow.  Swallow  intervention with chin tuck was effective 90% of the time,  however he was unable to demonstrate consistently performing.   SLP recommends Dys 3 diet and nectar thick liquids with chin  tuck.  As he increased proficiency with chin tuck, will attempt  with thin liquids.  Pills whole in applesauce, no straws, double  swallow with continued ST for safety.     Treatment Recommendation  Therapy as outlined in treatment plan below    Diet Recommendation Nectar-thick liquid;Dysphagia 3 (Mechanical  Soft)   Liquid Administration via: Cup;No straw Medication Administration: Whole meds with puree Supervision: Patient able to self feed;Full supervision/cueing  for  compensatory strategies Compensations: Slow rate;Small sips/bites;Multiple dry swallows  after each bite/sip;Clear throat intermittently Postural Changes and/or Swallow Maneuvers: Seated upright 90  degrees;Upright 30-60 min after meal    Other  Recommendations Oral  Care Recommendations: Oral care Q4  per protocol Other Recommendations: Order thickener from pharmacy   Follow Up Recommendations  Skilled Nursing facility    Frequency and Duration min 2x/week  2 weeks   Pertinent Vitals/Pain n/a           Reason for Referral Objectively evaluate swallowing function   Oral Phase Oral Preparation/Oral Phase Oral Phase: Impaired Oral - Nectar Oral - Nectar Cup: Piecemeal swallowing;Lingual/palatal residue Oral - Thin Oral - Thin Cup: Piecemeal swallowing;Lingual/palatal residue Oral - Solids Oral - Regular: Weak lingual manipulation;Delayed oral transit   Pharyngeal Phase Pharyngeal Phase Pharyngeal Phase: Impaired Pharyngeal - Nectar Pharyngeal - Nectar Teaspoon: Delayed swallow  initiation;Premature spillage to pyriform sinuses;Pharyngeal  residue - pyriform sinuses;Pharyngeal residue -  valleculae;Reduced tongue base retraction;Reduced laryngeal  elevation Pharyngeal - Nectar Cup: Delayed swallow initiation;Premature  spillage to pyriform sinuses;Pharyngeal residue - pyriform  sinuses;Pharyngeal residue - valleculae;Reduced tongue base  retraction;Reduced laryngeal elevation Pharyngeal - Thin Pharyngeal - Thin Teaspoon: Delayed swallow initiation;Premature  spillage to valleculae;Premature spillage to pyriform  sinuses;Penetration/Aspiration during swallow;Pharyngeal residue  - valleculae;Pharyngeal residue - pyriform sinuses;Reduced tongue  base retraction;Reduced laryngeal elevation Penetration/Aspiration details (thin teaspoon): Material enters  airway, remains ABOVE vocal cords then ejected out Pharyngeal - Thin Cup: Delayed swallow initiation;Premature  spillage to valleculae;Premature spillage to pyriform   sinuses;Pharyngeal residue - valleculae;Pharyngeal residue -  pyriform sinuses;Reduced laryngeal elevation;Reduced tongue base  retraction;Reduced airway/laryngeal  closure;Penetration/Aspiration during  swallow;Penetration/Aspiration after swallow Penetration/Aspiration details (thin cup): Material enters  airway, remains ABOVE vocal cords and not ejected out Pharyngeal - Solids Pharyngeal - Regular: Pharyngeal residue - pyriform  sinuses;Reduced tongue base retraction  Cervical Esophageal Phase    GO    Cervical Esophageal Phase Cervical Esophageal Phase: Impaired Cervical Esophageal Phase - Comment Cervical Esophageal Comment:  (trace residue upper esophagus)         Darrow Bussing.Ed CCC-SLP Pager [phone]  08/07/2013    Scheduled Meds: . cefTAZidime (FORTAZ)  IV  1 g Intravenous Q24H  . chlorhexidine  15 mL Mouth/Throat BID  . enoxaparin (LOVENOX) injection  30 mg Subcutaneous Q24H  . insulin aspart  0-9 Units Subcutaneous Q4H  . methylPREDNISolone (SOLU-MEDROL) injection  10 mg Intravenous Daily  . metoprolol  2.5 mg Intravenous Q6H  . pantoprazole (PROTONIX) IV  40 mg Intravenous Q24H  . sodium chloride  10 mL Intravenous Q12H  . sodium chloride  3 mL Intravenous Q12H  . tamsulosin  0.4 mg Oral QPC supper   Continuous Infusions: . sodium chloride 20 mL/hr at 08/04/13 0400      Time spent: 25 minutes    Montgomery Favor  [facility] Pager [phone]. If 7PM-7AM, please contact night-coverage at [email or web address], password [facility] 08/07/2013, 11:25 AM  LOS: 7 days

## 2013-08-07 NOTE — Progress Notes (Signed)
  Echocardiogram 2D Echocardiogram has been performed.  [name] 08/07/2013, 1:39 PM

## 2013-08-07 NOTE — Progress Notes (Signed)
OT Cancellation Note  Patient Details Name: [name] MRN: [id number] DOB: 1928-06-16   Cancelled Treatment:    Reason Eval/Treat Not Completed: Patient at procedure or test/ unavailable. Will re attempt later today as time allows  Galen Manila 08/07/2013, 10:23 AM

## 2013-08-07 NOTE — Progress Notes (Signed)
Physical Therapy Treatment Patient Details Name: [name] MRN: [id number] DOB: [birth year] Today's Date: 08/07/2013 Time: 7829-5621 PT Time Calculation (min): 24 min  PT Assessment / Plan / Recommendation  History of Present Illness 79 y.o. male with PMHx of COPD, recurrent UTIs and recent treatment for cellulitis who had called the EMS reportedly for shortness of breath. However it is of note the patient denies ever having shortness of breath. His main complaint was of not feeling well overall with some pain in his lower extremities   PT Comments   Pt slowly progressing towards goals with PT. Able to amb short distance to/from recliner. Pt requires max encouragement for therapy; states multiple times "im just too weak". Pt being taken to CT at end of session. Will cont to follow per POC.   Follow Up Recommendations  SNF;Supervision/Assistance - 24 hour     Does the patient have the potential to tolerate intense rehabilitation     Barriers to Discharge        Equipment Recommendations  Other (comment) (TBD)    Recommendations for Other Services    Frequency Min 3X/week   Progress towards PT Goals Progress towards PT goals: Progressing toward goals  Plan Current plan remains appropriate    Precautions / Restrictions Precautions Precautions: Fall Restrictions Weight Bearing Restrictions: No   Pertinent Vitals/Pain No c/o pain    Mobility  Bed Mobility Bed Mobility: Rolling Left;Left Sidelying to Sit;Sit to Sidelying Left;Rolling Right Rolling Right: 5: Supervision;With rail Rolling Left: 5: Supervision;With rail Left Sidelying to Sit: 5: Supervision;HOB elevated Sit to Sidelying Left: 4: Min assist;HOB flat Details for Bed Mobility Assistance: pt requries (A) to lift LEs onto bed; cues for hand placement and sequencing; pt requires incr time to come to EOB to sit; pt relies on handrails   Transfers Transfers: Sit to Stand;Stand to Sit Sit to Stand: 3: Mod assist;From  bed;With upper extremity assist Stand to Sit: 3: Mod assist;To chair/3-in-1;With armrests;To bed;With upper extremity assist Details for Transfer Assistance: pt requires multimodal cues and (A) to power up to standing position and control desecent to chair; pt demo decreased safety awareness with transfers; cues and (A) to manage RW with transfers; pt unsteady and tends to push posteriorly throughout transfers  Ambulation/Gait Ambulation/Gait Assistance: 3: Mod assist Ambulation Distance (Feet): 3 Feet (x2  bed <> recliner ) Assistive device: Rolling walker Ambulation/Gait Assistance Details: (A) to manage RW and maintain balance; pt unsteady throughout gt and demo SOB on 3L O2 Gait Pattern: Ataxic;Wide base of support;Trunk flexed Gait velocity: decreased General Gait Details: cues for upright posture throughout ambulation  Stairs: No Wheelchair Mobility Wheelchair Mobility: No    Exercises General Exercises - Lower Extremity Ankle Circles/Pumps: AROM;Both;10 reps;Seated Long Arc Quad: AROM;Both;10 reps;Seated Hip ABduction/ADduction: AROM;Both;10 reps;Seated Hip Flexion/Marching: AROM;Both;10 reps;Seated   PT Diagnosis:    PT Problem List:   PT Treatment Interventions:     PT Goals (current goals can now be found in the care plan section) Acute Rehab PT Goals Patient Stated Goal: to get stronger  PT Goal Formulation: With patient Time For Goal Achievement: 08/20/13 Potential to Achieve Goals: Good  Visit Information  Last PT Received On: 08/07/13 Assistance Needed: +1 History of Present Illness: 79 y.o. male with PMHx of COPD, recurrent UTIs and recent treatment for cellulitis who had called the EMS reportedly for shortness of breath. However it is of note the patient denies ever having shortness of breath. His main complaint was of not feeling  well overall with some pain in his lower extremities    Subjective Data  Subjective: pt lying supine; requires max encouragement to  participate in therapy. " i cant walk, im too weak" pt was agreeable to attempt ambulation and sit in chair Patient Stated Goal: to get stronger    Cognition  Cognition Arousal/Alertness: Awake/alert Behavior During Therapy: WFL for tasks assessed/performed Overall Cognitive Status: Impaired/Different from baseline Area of Impairment: Problem solving Memory: Decreased short-term memory Problem Solving: Slow processing;Decreased initiation;Difficulty sequencing;Requires tactile cues;Requires verbal cues    Balance  Balance Balance Assessed: Yes Static Sitting Balance Static Sitting - Balance Support: Bilateral upper extremity supported;Feet supported Static Sitting - Level of Assistance: 5: Stand by assistance  End of Session PT - End of Session Equipment Utilized During Treatment: Gait belt;Oxygen Activity Tolerance: Patient limited by fatigue Patient left: in bed;with call bell/phone within reach;Other (comment) (transport present to transfer to CT ) Nurse Communication: Mobility status   GP     Donell Sievert, [place] [phone] 08/07/2013, 11:52 AM

## 2013-08-07 NOTE — Progress Notes (Signed)
OT Cancellation Note  Patient Details Name: [name] MRN: [id number] DOB: 08-21-1928   Cancelled Treatment:    Reason Eval/Treat Not Completed: Patient declined, stating that he was too tired and not feeling the best and that he preferred OT return tomorrow. Will re attempt OT eval tomorrow Galen Manila 08/07/2013, 3:03 PM

## 2013-08-07 NOTE — Clinical Social Work Psychosocial (Signed)
Clinical Social Work Department BRIEF PSYCHOSOCIAL ASSESSMENT 08/07/2013  Patient:  [name], [name]     Account Number:  [id number]     Admit date:  07/31/2013  Clinical Social Worker:  Delmer Islam  Date/Time:  08/07/2013 04:27 AM  Referred by:  Physician  Date Referred:  08/07/2013 Referred for  SNF Placement   Other Referral:   Interview type:  Patient Other interview type:   CSW also talked with patient's daughter [name] ([phone]) by phone.    PSYCHOSOCIAL DATA Living Status:  FAMILY Admitted from facility:   Level of care:   Primary support name:  [name] Primary support relationship to patient:  CHILD, ADULT Degree of support available:   Patient has 2 daughters, [name] and [name].    CURRENT CONCERNS Current Concerns  Post-Acute Placement   Other Concerns:   Patient having a 60-day wellness period for skilled facility placement    SOCIAL WORK ASSESSMENT / PLAN CSW talked with the patient regarding recommendation of short-term rehab and his weakness as mentioned in the PT eval and patient voiced understanding and agreement. When asked, patient responded that he has been to a skilled facility before [facility] and patient added that he does not want to return there.  [name] gave CSW permission to contact his daughter [name].    CSW talked with daughter by phone regarding short-term rehab for patient and she is in agreement. Daughter was able to provide CSW with important information regarding patient's hospital, LTAC and SNF stays. Per daughter patient was at [facility] from June to July 2014. Soon after he returned to hospital and then discharged to [facility] because he had used all of his skilled  Medicare days and did not have the 60-day wellness period. Per daughter, patient went to [facility] 8/7 or 8/8 and was there w or 3 weeks. CSW informed daughter that patient's stay at [facility] would be verified in terms of whether he was in the  SNF or hospital.   Assessment/plan status:  Psychosocial Support/Ongoing Assessment of Needs Other assessment/ plan:   Nurse case manager contacted [facility] and verified patient's LTAC stay as 8/8 through 9/5. From 9/6 - 07/30/13 is 57 days; 3 days short of the 60-day wellness period. Patient will be referred to LTAC ([facility] to determine if they can accept him. Daughter contacted and updated.   Information/referral to community resources:   SNF list for [facility]    PATIENT'S/FAMILY'S RESPONSE TO PLAN OF CARE: Patient and daughter pleasant and receptive to talking with CSW regarding discharge plans. Daughter expressed appreciation for CSW's assistance and was advised that she will be kept updated regarding post-discharge plans.

## 2013-08-07 NOTE — Procedures (Signed)
Objective Swallowing Evaluation: Modified Barium Swallowing Study  Patient Details  Name: CAIDEN ARTEAGA MRN: [id number] Date of Birth: 11/10/1927  Today's Date: 08/07/2013 Time: 1000-1021 SLP Time Calculation (min): 21 min  Past Medical History:  Past Medical History  Diagnosis Date  . Hypertension   . Arthritis   . Ex-smoker   . Rheumatoid arthritis(714.0)   . Pulmonary nodule   . DDD (degenerative disc disease)   . Complication of anesthesia     " I am difficult to wake up "  . Chronic kidney disease     acute 2013, 2014 on chronic stage 3.  . Anemia   . Proteus septicemia 02/2013  . Barrett's esophagus 04/2006    egd by dr Virginia Rochester.   . Colon polyp 04/2006    Hyperplastic on colonoscopy by Dr Virginia Rochester.   . Encephalopathy, metabolic 02/2013   Past Surgical History:  Past Surgical History  Procedure Laterality Date  . Joint replacement      bilateral knee replacement  . Hernia repair    . Back surgery      X 2  . Appendectomy     HPI:  79 year old male with history of hypertension, TOPD, rheumatoid arthritis on chronic prednisone, recurrent UTIs, CKD and anemia admitted with acute onset of dyspnea with hypoxia, acute on chronic renal failure secondary to UTI. He was also hypothermic. His intermittent step down unit and found to have episode of hematochezia with clots. Patient started on empiric antibiotics. Patient was transfused 2 units [facility] pediatric GI was consulted. On 11/5 patient was hypercapnic, failed BiPAP and was transferred to ICU he was intubated. Patient successfully extubated on 11/7. Cultures have so far been negative. A renal ultrasound done showed bilateral hydronephrosis ( Rt>Lt)     Assessment / Plan / Recommendation Clinical Impression  Dysphagia Diagnosis: Mild oral phase dysphagia;Mild pharyngeal phase dysphagia;Moderate pharyngeal phase dysphagia Clinical impression: Mild oral dysphagia described with motor deficits leading to piecemeal swallow pattern with  mild lingual residue.  Mild-moderate sensorimotor pharyngeal dysphagia with delayed swallow initiation and decreased laryngeal elevation and closure with thin.  Penetration with thin consistency both flash as well as remaining in vestibule slightly above vocal cords without sensation.  Mild vallecular and pyriform sinus residue was reduced with verbal cues for double swallow.  Swallow intervention with chin tuck was effective 90% of the time, however he was unable to demonstrate consistently performing.  SLP recommends Dys 3 diet and nectar thick liquids with chin tuck.  As he increased proficiency with chin tuck, will attempt with thin liquids.  Pills whole in applesauce, no straws, double swallow with continued ST for safety.     Treatment Recommendation  Therapy as outlined in treatment plan below    Diet Recommendation Nectar-thick liquid;Dysphagia 3 (Mechanical Soft)   Liquid Administration via: Cup;No straw Medication Administration: Whole meds with puree Supervision: Patient able to self feed;Full supervision/cueing for compensatory strategies Compensations: Slow rate;Small sips/bites;Multiple dry swallows after each bite/sip;Clear throat intermittently Postural Changes and/or Swallow Maneuvers: Seated upright 90 degrees;Upright 30-60 min after meal    Other  Recommendations Oral Care Recommendations: Oral care Q4 per protocol Other Recommendations: Order thickener from pharmacy   Follow Up Recommendations  Skilled Nursing facility    Frequency and Duration min 2x/week  2 weeks   Pertinent Vitals/Pain n/a           Reason for Referral Objectively evaluate swallowing function   Oral Phase Oral Preparation/Oral Phase Oral Phase: Impaired Oral - Nectar  Oral - Nectar Cup: Piecemeal swallowing;Lingual/palatal residue Oral - Thin Oral - Thin Cup: Piecemeal swallowing;Lingual/palatal residue Oral - Solids Oral - Regular: Weak lingual manipulation;Delayed oral transit    Pharyngeal Phase Pharyngeal Phase Pharyngeal Phase: Impaired Pharyngeal - Nectar Pharyngeal - Nectar Teaspoon: Delayed swallow initiation;Premature spillage to pyriform sinuses;Pharyngeal residue - pyriform sinuses;Pharyngeal residue - valleculae;Reduced tongue base retraction;Reduced laryngeal elevation Pharyngeal - Nectar Cup: Delayed swallow initiation;Premature spillage to pyriform sinuses;Pharyngeal residue - pyriform sinuses;Pharyngeal residue - valleculae;Reduced tongue base retraction;Reduced laryngeal elevation Pharyngeal - Thin Pharyngeal - Thin Teaspoon: Delayed swallow initiation;Premature spillage to valleculae;Premature spillage to pyriform sinuses;Penetration/Aspiration during swallow;Pharyngeal residue - valleculae;Pharyngeal residue - pyriform sinuses;Reduced tongue base retraction;Reduced laryngeal elevation Penetration/Aspiration details (thin teaspoon): Material enters airway, remains ABOVE vocal cords then ejected out Pharyngeal - Thin Cup: Delayed swallow initiation;Premature spillage to valleculae;Premature spillage to pyriform sinuses;Pharyngeal residue - valleculae;Pharyngeal residue - pyriform sinuses;Reduced laryngeal elevation;Reduced tongue base retraction;Reduced airway/laryngeal closure;Penetration/Aspiration during swallow;Penetration/Aspiration after swallow Penetration/Aspiration details (thin cup): Material enters airway, remains ABOVE vocal cords and not ejected out Pharyngeal - Solids Pharyngeal - Regular: Pharyngeal residue - pyriform sinuses;Reduced tongue base retraction  Cervical Esophageal Phase    GO    Cervical Esophageal Phase Cervical Esophageal Phase: Impaired Cervical Esophageal Phase - Comment Cervical Esophageal Comment:  (trace residue upper esophagus)         Darrow Bussing.Ed [employer] [phone]  08/07/2013

## 2013-08-07 NOTE — Progress Notes (Signed)
NUTRITION FOLLOW-UP  Intervention:   Magic cup TID with meals, each supplement provides 290 kcal and 9 grams of protein. Add Ensure Pudding po TID, each supplement provides 170 kcal and 4 grams of protein.  Diet texture and liquid consistency per SLP. RD to continue to follow nutrition care plan.  Nutrition Dx:   Inadequate oral intake now related to swallowing difficulty as evidenced by variable meal completion, ongoing.  New Goal:   Oral intake to meet > 90% of estimated nutrition needs, currently unmet.  Monitor:   weight trends, lab trends, I/O's, PO intake, supplement tolerance  Assessment:   Patient with PMH of HTN, DDD, RA, mulitple UTI's brought in by his daughter for his SOB, productive cough and chills -- hypoxic upon arrival to ED; found to have acute on chronic renal failure, hypothermia and UTI.   Patient transferred to from 2C-Stepdown to 2S-Surgical for progressive respiratory declined. Extubated 11/7.  BSE completed by SLP on 11/7 with recommendations for NPO status. MBSS completed by SLP earlier today with recommendations for Dysphagia 3 diet and nectar-thickened liquids. Pt has only received one tray since diet was ordered and he only ate a little bit of it, as he was he was off unit for a bit this morning, tray is currently still in room.  Sodium is elevated at 146.  Per MD note, likely d/c tomorrow to SNF if bed available.  Height: 07/31/13 5\' 5"  (1.651 m)   Weight Status:   Wt Readings from Last 1 Encounters:  08/06/13 150 lb 2.1 oz (68.1 kg)  Admit wt 172 lb; currently -7 liters I/O's  BMI: Body mass index is 28.65 kg/(m^2) - Overweight  Re-estimated needs:  Kcal: 1500-1650 Protein: 95-105 gm Fluid: per MD  Skin: Intact  Diet Order: Dysphagia 3; Nectar Thickened Liquids   Intake/Output Summary (Last 24 hours) at 08/07/13 1350 Last data filed at 08/07/13 1235  Gross per 24 hour  Intake    160 ml  Output    300 ml  Net   -140 ml    Last BM:  11/10  Labs:   Recent Labs Lab 08/03/13 0400 08/03/13 1810 08/04/13 0410 08/05/13 0438 08/06/13 0541 08/07/13 0540  NA 147* 146* 145 145 146*  --   K 3.4* 3.9 3.6 3.3* 3.5  --   CL 112 108 104 95* 99  --   CO2 24 25 29  37* 32  --   BUN 51* 52* 56* 60* 59*  --   CREATININE 3.34* 3.19* 3.05* 2.93* 2.69* 2.71*  CALCIUM 8.4 8.3* 8.5 8.5 8.8  --   MG 1.3* 1.8 2.5 2.3  --   --   PHOS 3.1 2.3 2.3  --   --   --   GLUCOSE 99 160* 122* 88 83  --     CBG (last 3)   Recent Labs  08/07/13 0432 08/07/13 0738 08/07/13 1128  GLUCAP 195* 75 103*    Scheduled Meds: . cefTAZidime (FORTAZ)  IV  1 g Intravenous Q24H  . chlorhexidine  15 mL Mouth/Throat BID  . enoxaparin (LOVENOX) injection  30 mg Subcutaneous Q24H  . insulin aspart  0-9 Units Subcutaneous Q4H  . metoprolol tartrate  25 mg Oral BID  . pantoprazole  40 mg Oral Daily  . [START ON 08/08/2013] predniSONE  5 mg Oral Q breakfast  . sodium chloride  10 mL Intravenous Q12H  . sodium chloride  3 mL Intravenous Q12H  . tamsulosin  0.4 mg Oral QPC  supper    Continuous Infusions: . sodium chloride 20 mL/hr at 08/04/13 0400    Jarold Motto MS, [place], LDN Pager: [phone] After-hours pager: [phone]

## 2013-08-08 ENCOUNTER — Inpatient Hospital Stay
Admission: AD | Admit: 2013-08-08 | Discharge: 2013-09-01 | Disposition: A | Discharge status: Home Health | Payer: Medicare Other | Source: Ambulatory Visit | Attending: Internal Medicine | Admitting: Internal Medicine

## 2013-08-08 DIAGNOSIS — K921 Melena: Secondary | ICD-10-CM

## 2013-08-08 DIAGNOSIS — J690 Pneumonitis due to inhalation of food and vomit: Secondary | ICD-10-CM

## 2013-08-08 DIAGNOSIS — D62 Acute posthemorrhagic anemia: Secondary | ICD-10-CM

## 2013-08-08 DIAGNOSIS — J9602 Acute respiratory failure with hypercapnia: Secondary | ICD-10-CM | POA: Diagnosis present

## 2013-08-08 LAB — GLUCOSE, CAPILLARY
Glucose-Capillary: 111 mg/dL — ABNORMAL HIGH (ref 70–99)
Glucose-Capillary: 95 mg/dL (ref 70–99)

## 2013-08-08 MED ORDER — ENSURE PUDDING PO PUDG: 1.0000 | Freq: Three times a day (TID) | ORAL | Status: DC

## 2013-08-08 MED ORDER — CAMPHOR-MENTHOL 0.5-0.5 % EX LOTN: TOPICAL_LOTION | CUTANEOUS | Status: DC | PRN

## 2013-08-08 MED ORDER — RESOURCE THICKENUP CLEAR PO POWD: 1.0000 | ORAL | Status: DC | PRN

## 2013-08-08 MED ORDER — DIPHENHYDRAMINE HCL 50 MG/ML IJ SOLN: 12.5000 mg | Freq: Four times a day (QID) | INTRAMUSCULAR | Status: DC | PRN

## 2013-08-08 MED ORDER — LEVOFLOXACIN 750 MG PO TABS: 750.0000 mg | ORAL_TABLET | Freq: Every day | ORAL | Status: AC

## 2013-08-08 NOTE — Evaluation (Signed)
Occupational Therapy Evaluation Patient Details Name: [name] MRN: [id number] DOB: 1928-06-09 Today's Date: 08/08/2013 Time: 4098-1191 OT Time Calculation (min): 30 min  OT Assessment / Plan / Recommendation History of present illness 79 y.o. male with PMHx of COPD, recurrent UTIs and recent treatment for cellulitis who had called the EMS reportedly for shortness of breath. However it is of note the patient denies ever having shortness of breath. His main complaint was of not feeling well overall with some pain in his lower extremities   Clinical Impression   Pt currently needs mod to min assist for selfcare tasks and functional transfers.  Feel he will benefit from OT in acute care to begin strengthening and regaining independence with basic selfcare tasks. Since pt does not have 24 hour supervision at discharge feel he will need SNF for follow-up therapy.      OT Assessment  Patient needs continued OT Services    Follow Up Recommendations  SNF    Barriers to Discharge Decreased caregiver support    Equipment Recommendations  None recommended by OT       Frequency  Min 2X/week    Precautions / Restrictions Precautions Precautions: Fall Restrictions Weight Bearing Restrictions: No   Pertinent Vitals/Pain O2 sats 89% on room air and HR 90 after performing transfers    ADL  Eating/Feeding: Simulated;Independent Where Assessed - Eating/Feeding: Chair Grooming: Simulated;Set up Where Assessed - Grooming: Unsupported sitting Upper Body Bathing: Simulated;Set up Where Assessed - Upper Body Bathing: Unsupported sitting Lower Body Bathing: Simulated;Minimal assistance Where Assessed - Lower Body Bathing: Supported sit to stand Upper Body Dressing: Simulated;Set up Where Assessed - Upper Body Dressing: Unsupported sitting Lower Body Dressing: Simulated;Moderate assistance Where Assessed - Lower Body Dressing: Supported sit to stand Toilet Transfer: Microbiologist Method: Other (comment) (stand pivot with RW.) Toilet Transfer Equipment: Other (comment) (simulated to EOB) Toileting - Clothing Manipulation and Hygiene: Simulated;Minimal assistance Where Assessed - Engineer, mining and Hygiene: Other (comment) (sit to stand from the EOB) Tub/Shower Transfer Method: Not assessed Equipment Used: Gait belt;Rolling walker Transfers/Ambulation Related to ADLs: Pt transferred with min assist using the RW.  Demonstrates kyphotic posture in standing and takes small steps. ADL Comments: Pt with decreased ability to reach either foot for dressing tasks.  He reports that his aide assisted him with bathing and dressing tasks.    OT Diagnosis: Generalized weakness;Cognitive deficits  OT Problem List: Decreased strength;Decreased activity tolerance;Impaired balance (sitting and/or standing) OT Treatment Interventions: Self-care/ADL training;Therapeutic activities;DME and/or AE instruction;Balance training;Patient/family education   OT Goals(Current goals can be found in the care plan section) Acute Rehab OT Goals Patient Stated Goal: to get stronger  OT Goal Formulation: With patient Time For Goal Achievement: 08/22/13 Potential to Achieve Goals: Good ADL Goals Pt Will Perform Grooming: with supervision;standing Pt Will Perform Lower Body Bathing: with supervision;sit to/from stand;with adaptive equipment Pt Will Perform Lower Body Dressing: with supervision;with adaptive equipment;sit to/from stand Pt Will Transfer to Toilet: with supervision;ambulating;bedside commode Pt Will Perform Toileting - Clothing Manipulation and hygiene: with supervision;sit to/from stand  Visit Information  Last OT Received On: 08/08/13 Assistance Needed: +1 History of Present Illness: 79 y.o. male with PMHx of COPD, recurrent UTIs and recent treatment for cellulitis who had called the EMS reportedly for shortness of breath. However it is of  note the patient denies ever having shortness of breath. His main complaint was of not feeling well overall with some pain in his lower extremities  Prior Functioning     Home Living Family/patient expects to be discharged to:: Private residence Living Arrangements: Children Available Help at Discharge: Family;Skilled Nursing Facility Type of Home: House Home Access: Ramped entrance Home Layout: One level Home Equipment: Environmental consultant - 2 wheels Additional Comments: per pt he was not on home oxygen Prior Function Level of Independence: Needs assistance Gait / Transfers Assistance Needed: pt able to amb with RW throughout house without (A) from daughter  ADL's / Homemaking Assistance Needed: per pt he takes sponge baths and can clean himself but requires setup (A); daughter is responsible for homemaking   Communication Communication: HOH;Expressive difficulties Dominant Hand: Right         Vision/Perception Vision - History Baseline Vision: No visual deficits Patient Visual Report: No change from baseline Vision - Assessment Vision Assessment: Vision not tested Perception Perception: Within Functional Limits Praxis Praxis: Intact   Cognition  Cognition Arousal/Alertness: Awake/alert Behavior During Therapy: WFL for tasks assessed/performed Overall Cognitive Status: No family/caregiver present to determine baseline cognitive functioning Area of Impairment: Problem solving Memory: Decreased short-term memory    Extremity/Trunk Assessment Upper Extremity Assessment Upper Extremity Assessment: Generalized weakness (AROM for bilateral shoulder flexion 0-110 degrees) Lower Extremity Assessment Lower Extremity Assessment: Defer to PT evaluation Cervical / Trunk Assessment Cervical / Trunk Assessment: Kyphotic     Mobility Bed Mobility Bed Mobility: Supine to Sit;Sit to Supine Supine to Sit: HOB flat;HOB elevated;5: Supervision Sit to Supine: HOB flat;5:  Supervision Transfers Transfers: Sit to Stand Sit to Stand: 4: Min assist;With upper extremity assist;From bed Stand to Sit: 4: Min assist;To bed;Without upper extremity assist        Balance Balance Balance Assessed: Yes Dynamic Sitting Balance Dynamic Sitting - Balance Support: No upper extremity supported Dynamic Sitting - Level of Assistance: 5: Stand by assistance Static Standing Balance Static Standing - Balance Support: Bilateral upper extremity supported Static Standing - Level of Assistance: 4: Min assist   End of Session OT - End of Session Equipment Utilized During Treatment: Gait belt Activity Tolerance: Patient limited by fatigue Patient left: in bed;with call bell/phone within reach Nurse Communication:  (Need to have the bed changed secondary to condom cath leaking.)     Talia Hoheisel OTR/L Pager number [phone] 08/08/2013, 2:18 PM

## 2013-08-08 NOTE — Discharge Summary (Addendum)
Physician Discharge Summary  [name] QMV:[id number] DOB: 07/17/1928 DOA: 07/31/2013  PCP: [name], MD  Admit date: 07/31/2013 Discharge date: 08/08/2013  Time spent: 40  minutes  Recommendations for Outpatient Follow-up:  1. Discharge to LTAC  Discharge Diagnoses:  Principal Problem:   Acute respiratory failure with hypercapnia  Active Problems:   Sepsis presumed infectious due to Aspiration pneumonia and UTI, present on admission. ( negative cultures)   HYPERTENSION Diastolic CHF   Rheumatoid arthritis(714.0)   COPD (chronic obstructive pulmonary disease)   CKD (chronic kidney disease) stage 3, GFR 30-59 ml/min   UTI (urinary tract infection)   Acute encephalopathy   Obstructive uropathy/chronic bilateral hydronephrosis   Hematochezia   Acute blood loss anemia   Acute renal failure   Hypothermia    Discharge Condition: fair  Diet recommendation: dys level 3  Filed Weights   08/05/13 0500 08/06/13 2021 08/07/13 2003  Weight: 72.303 kg (159 lb 6.4 oz) 68.1 kg (150 lb 2.1 oz) 68.1 kg (150 lb 2.1 oz)    History of present illness:  79 year old male with history of hypertension, TOPD, rheumatoid arthritis on chronic prednisone, recurrent UTIs, CKD and anemia admitted with acute onset of dyspnea with hypoxia, acute on chronic renal failure secondary to UTI. He was also hypothermic. His intermittent step down unit and found to have episode of hematochezia with clots. Patient started on empiric antibiotics. Patient was transfused 2 units PRBC pediatric GI was consulted. On 11/5 patient was hypercapnic, failed BiPAP and was transferred to ICU he was intubated. Patient successfully extubated on 11/7. Cultures have so far been negative. A renal ultrasound done showed bilateral hydronephrosis ( Rt>Lt).   Hospital Course:  Acute hypercarbic respiratory failure  CXR showed atelectasis and b/l pleural effusion. Possible aspiration pneumonia versus CHF that could be the  cause. Patient intubated and transferred to ICU.  extubated successfully and transferred to medical floor.  - has reported history of COPD as well.  Continue O2 via nasal cannula  -was kept n.p.o. due to poor swallow reflex . Had MBS done and started on dys level 3 diet.  Continue nebulizer.   Sepsis due to Recurrent UTI and possible aspiration pneumonia   Vancomycin discontinued. Day 7 of abx. Day 9 of fortaz. Will d/c on po levaquin.Will treat for 10 days. Urine cx on admission with now growth.   Acute CHF  2-D echo about 2 months back without clear LV Function. Elevated pro BNP.  repeat 2D echo shows grade 1 diastolic dysfunction. Currently euvolemic.  Chronic bilateral hydronephrosis  Patient has recurrent UTIs. Was on foley upon previous dsicharge. As per daughter [name] , he saw Dr Perley Jain after Last hospital discharge and his foley was removed. Has good UOP now. Will follow with him as outpt.  continue flomax   Acute on chronic kidney disease  baseline creatinine 1.6-2. Monitor renal function closely. Acute kidney injury likely in the setting of obstructive uropapthy and ? Cardiorenal syndrome.  Monitor renal fn as outpatient  rheumatoid arthritis  On chronic prednisone.  Hematochezia  no further bleeding. Received 2 U prbc  Seen by [facility]. Hb stable now   Diet: dys level 3  Code Status: FULL CODE as per daughter  Family Communication: discussed with [name] ( daughter ) over the phone  Disposition Plan: LTAC    Consultants:  PCCM  Procedures:  11/5 ETT>>>11/7  11/5 Rt radial A line>>>11/7  11/5 Lt [place] CVL>>>   Antibiotics:  Rocephin 11/3>>> 11/05  Vancomycin 11/3>>>11/8  [facility] 11/06 >11/11 LEVAQUIN 11/12   Discharge Exam: Filed Vitals:   08/08/13 1401  BP: 105/63  Pulse: 89  Temp: 98 F (36.7 C)  Resp: 20    General: Elderly male lying in bed in no acute distress. Appears frail  HEENT: No pallor, moist oral mucosa  Chest: Scattered rhonchi  bilaterally, no added sounds  CVS: Normal S1 and S2, no murmurs rub or gallop  Abdomen: Soft, nontender, nondistended, bowel sounds present  Extremities: Warm, no edema, has a condom catheter.  CNS: AAO x3   Discharge Instructions     Medication List         albuterol (2.5 MG/3ML) 0.083% nebulizer solution  Commonly known as:  PROVENTIL  Take 2.5 mg by nebulization every 4 (four) hours as needed. For shortness of breath     alendronate 70 MG tablet  Commonly known as:  FOSAMAX  Take 70 mg by mouth every 7 (seven) days. Take with a full glass of water on an empty stomach.  On Friday     ALIGN 4 MG Caps  Take 1 capsule by mouth every morning.     aspirin EC 81 MG tablet  Take 81 mg by mouth every morning.     camphor-menthol lotion  Commonly known as:  SARNA  Apply topically as needed for itching.     diphenhydrAMINE 50 MG/ML injection  Commonly known as:  BENADRYL  Inject 0.25 mLs (12.5 mg total) into the vein every 6 (six) hours as needed for itching.     escitalopram 10 MG tablet  Commonly known as:  LEXAPRO  Take 10 mg by mouth daily.     feeding supplement (ENSURE) Pudg  Take 1 Container by mouth 3 (three) times daily between meals.     gabapentin 300 MG capsule  Commonly known as:  NEURONTIN  Take 300 mg by mouth at bedtime.     metoprolol tartrate 25 MG tablet  Commonly known as:  LOPRESSOR  Take 2 tablets (50 mg total) by mouth 2 (two) times daily.     omeprazole 20 MG capsule  Commonly known as:  PRILOSEC  Take 20 mg by mouth daily.     predniSONE 5 MG tablet  Commonly known as:  DELTASONE  Take 5 mg by mouth daily.     RESOURCE THICKENUP CLEAR Powd  Take 120 g by mouth as needed.     SUPER B COMPLEX/C Caps  Take 1 capsule by mouth daily.     tamsulosin 0.4 MG Caps capsule  Commonly known as:  FLOMAX  Take 1 capsule (0.4 mg total) by mouth daily after supper.     traMADol-acetaminophen 37.5-325 MG per tablet  Commonly known as:  ULTRACET   Take 1 tablet by mouth every 6 (six) hours as needed for pain.     trimethoprim 100 MG tablet  Commonly known as:  TRIMPEX  Take 100 mg by mouth daily.     Vitamin D 2000 UNITS tablet  Take 2,000 Units by mouth every morning.       Tab Levaquin 750 mg  1tab po on 11/12   Allergies  Allergen Reactions  . Codeine Other (See Comments)     drives me crazy  . Hydrocodone Other (See Comments)    Makes patient hallucinate, cannot sleep  . Oxycodone Other (See Comments)    Makes patient hallucinate, cannot sleep  . Penicillins Hives  . Sulfa Antibiotics Other (See Comments)    Per [facility]  Follow-up Information   Follow up with PANG,RICHARD, MD In 1 week. (after discharge from [facility])    Specialty:  Internal Medicine   Contact information:   [address], [address] [place] [address] [phone]       Follow up with MCDIARMID,TODD D, MD In 4 weeks. (after discharge from [facility])    Specialty:  Family Medicine   Contact information:   [address] [place] [address] [phone]        The results of significant diagnostics from this hospitalization (including imaging, microbiology, ancillary and laboratory) are listed below for reference.    Significant Diagnostic Studies: Dg Chest 2 View  07/31/2013   CLINICAL DATA:  Cough. Shortness of breath. History of smoking and hypertension.  EXAM: CHEST  2 VIEW  COMPARISON:  05/02/2013  FINDINGS: The patient has had median sternotomy. The heart is enlarged. Shallow lung inflation. Prominent lung markings are again noted. The patient has been shown to have multiple pulmonary nodules on previous CT exams. There are no focal consolidations.  IMPRESSION: 1. Cardiomegaly without pulmonary edema. 2. Shallow lung inflation. 3. No new consolidations. 4. Known multiple pulmonary nodules.   Electronically Signed   By: Rosalie Gums M.D.   On: 07/31/2013 11:51   US Renal  07/31/2013   CLINICAL DATA:  Hydronephrosis  EXAM:  RENAL/URINARY TRACT ULTRASOUND COMPLETE  COMPARISON:  CT scan 04/29/2013.  FINDINGS: Right Kidney  Length: 10.8 cm. Normal echogenicity and normal renal cortical thickness. No focal lesions. Moderate to advanced hydronephrosis.  Left Kidney  Length: 10.8 cm. Normal echogenicity and normal renal cortical thickness. Moderate hydronephrosis. No definite renal calculi.  Bladder  Decompressed by a Foley catheter.  IMPRESSION: Bilateral hydronephrosis, right greater than left.   Electronically Signed   By: Loralie Champagne M.D.   On: 07/31/2013 18:48   Dg Chest Port 1 View  08/05/2013   CLINICAL DATA:  Respiratory failure and pleural effusions.  EXAM: PORTABLE CHEST - 1 VIEW  COMPARISON:  08/04/2013  FINDINGS: Interval extubation and removal of nasogastric tube. Left-sided central line shows stable positioning. Lungs show improved aeration bilaterally with no edema remaining. Mild atelectasis present at the right lung base. Less prominent bilateral pleural effusions appear small in volume. The heart size is stable.  IMPRESSION: Improved aeration, diminished appearance of bilateral pleural effusions and no significant residual pulmonary edema.   Electronically Signed   By: Irish Lack M.D.   On: 08/05/2013 09:22   Dg Chest Port 1 View  08/04/2013   CLINICAL DATA:  Pulmonary edema  EXAM: PORTABLE CHEST - 1 VIEW  COMPARISON:  08/03/2013  FINDINGS: An endotracheal tube is again identified and stable from the prior exam. A nasogastric catheter is noted within the stomach. The left central venous line is noted with the tip deep in the right atrium. The cardiac shadow is stable. Bibasilar atelectatic changes are again identified. Mild central vascular congestion remains no new focal abnormality is seen.  IMPRESSION: When compared with the prior exam, no significant interval change is noted.   Electronically Signed   By: Alcide Clever M.D.   On: 08/04/2013 07:44   Dg Chest Port 1 View  08/03/2013   CLINICAL DATA:   Evaluate endotracheal tube positioning  EXAM: PORTABLE CHEST - 1 VIEW  COMPARISON:  08/02/2013; 07/31/2013  FINDINGS: Unchanged cardiac silhouette and mediastinal contours given persistently reduced lung volumes and patient rotation. Post median sternotomy and CABG. Stable position of support apparatus. No pneumothorax. Pulmonary vasculature  is less distinct on the present examination with cephalization of flow. Grossly unchanged small bilateral pleural effusions with associated bibasilar heterogeneous opacities, right greater than left. No new focal airspace opacities. Unchanged bones.  IMPRESSION: 1.  Stable positioning of support apparatus.  No pneumothorax. 2. Suspected mild worsening of pulmonary edema with otherwise unchanged small bilateral effusions and associated bibasilar opacities, right greater than left, atelectasis versus infiltrate.   Electronically Signed   By: Simonne Come M.D.   On: 08/03/2013 07:52   Dg Chest Port 1 View  08/02/2013   CLINICAL DATA:  Evaluate endotracheal tube and central line placement.  EXAM: PORTABLE CHEST - 1 VIEW  COMPARISON:  Chest x-ray 08/02/2013.  FINDINGS: An endotracheal tube is in place with tip 3.2 cm above the carina. A nasogastric tube is seen extending into the stomach, however, the tip of the nasogastric tube extends below the lower margin of the image. There is a left-sided subclavian central venous catheter with tip terminating in the superior cavoatrial junction. Lung volumes are low. Persistent bibasilar opacities may reflect areas of atelectasis and/or consolidation. Small left and small to moderate right pleural effusions are unchanged. Mild crowding of the pulmonary vasculature, without frank pulmonary edema. Mild cardiomegaly. The patient is rotated to the left on today's exam, resulting in distortion of the mediastinal contours and reduced diagnostic sensitivity and specificity for mediastinal pathology. Atherosclerosis in the thoracic aorta. Status  post median sternotomy.  IMPRESSION: 1. Support apparatus, as above. 2. Improving aeration, likely related to resolving pulmonary edema. 3. Persistent bibasilar atelectasis and/or consolidation with small to moderate right and small left pleural effusions.   Electronically Signed   By: Trudie Reed M.D.   On: 08/02/2013 22:11   Dg Chest Port 1 View  08/02/2013   CLINICAL DATA:  Bilateral rhonchi.  EXAM: PORTABLE CHEST - 1 VIEW  COMPARISON:  CHEST x-ray 07/31/2013.  FINDINGS: Lung volumes are low. Bibasilar opacities may reflect areas of atelectasis and/or consolidation. There is cephalization of the pulmonary vasculature and slight indistinctness of the interstitial markings suggestive of mild pulmonary edema. Small bilateral pleural effusions. Mild cardiomegaly. The patient is rotated to the left on today's exam, resulting in distortion of the mediastinal contours and reduced diagnostic sensitivity and specificity for mediastinal pathology. Status post median sternotomy.  IMPRESSION: 1. The appearance the chest suggests developing mild congestive heart failure. 2. Bibasilar atelectasis and/or consolidation.   Electronically Signed   By: Trudie Reed M.D.   On: 08/02/2013 06:29   Dg Swallowing Func-speech Pathology  08/07/2013   Breck Coons [place], CCC-SLP     08/07/2013 10:44 AM Objective Swallowing Evaluation: Modified Barium Swallowing Study   Patient Details  Name: JAXSYN AZAM MRN: [id number] Date of Birth: 1928-05-04  Today's Date: 08/07/2013 Time: 1000-1021 SLP Time Calculation (min): 21 min  Past Medical History:  Past Medical History  Diagnosis Date  . Hypertension   . Arthritis   . Ex-smoker   . Rheumatoid arthritis(714.0)   . Pulmonary nodule   . DDD (degenerative disc disease)   . Complication of anesthesia     " I am difficult to wake up "  . Chronic kidney disease     acute 2013, 2014 on chronic stage 3.  . Anemia   . Proteus septicemia 02/2013  . Barrett's esophagus 04/2006    egd by dr  Virginia Rochester.   . Colon polyp 04/2006    Hyperplastic on colonoscopy by Dr Virginia Rochester.   . Encephalopathy, metabolic 02/2013  Past Surgical History:  Past Surgical History  Procedure Laterality Date  . Joint replacement      bilateral knee replacement  . Hernia repair    . Back surgery      X 2  . Appendectomy     HPI:  79 year old male with history of hypertension, TOPD, rheumatoid  arthritis on chronic prednisone, recurrent UTIs, CKD and anemia  admitted with acute onset of dyspnea with hypoxia, acute on  chronic renal failure secondary to UTI. He was also hypothermic.  His intermittent step down unit and found to have episode of  hematochezia with clots. Patient started on empiric antibiotics.  Patient was transfused 2 units PRBC pediatric GI was consulted.  On 11/5 patient was hypercapnic, failed BiPAP and was transferred  to ICU he was intubated. Patient successfully extubated on 11/7.  Cultures have so far been negative. A renal ultrasound done  showed bilateral hydronephrosis ( Rt>Lt)     Assessment / Plan / Recommendation Clinical Impression  Dysphagia Diagnosis: Mild oral phase dysphagia;Mild pharyngeal  phase dysphagia;Moderate pharyngeal phase dysphagia Clinical impression: Mild oral dysphagia described with motor  deficits leading to piecemeal swallow pattern with mild lingual  residue.  Mild-moderate sensorimotor pharyngeal dysphagia with  delayed swallow initiation and decreased laryngeal elevation and  closure with thin.  Penetration with thin consistency both flash  as well as remaining in vestibule slightly above vocal cords  without sensation.  Mild vallecular and pyriform sinus residue  was reduced with verbal cues for double swallow.  Swallow  intervention with chin tuck was effective 90% of the time,  however he was unable to demonstrate consistently performing.   SLP recommends Dys 3 diet and nectar thick liquids with chin  tuck.  As he increased proficiency with chin tuck, will attempt  with thin liquids.   Pills whole in applesauce, no straws, double  swallow with continued ST for safety.     Treatment Recommendation  Therapy as outlined in treatment plan below    Diet Recommendation Nectar-thick liquid;Dysphagia 3 (Mechanical  Soft)   Liquid Administration via: Cup;No straw Medication Administration: Whole meds with puree Supervision: Patient able to self feed;Full supervision/cueing  for compensatory strategies Compensations: Slow rate;Small sips/bites;Multiple dry swallows  after each bite/sip;Clear throat intermittently Postural Changes and/or Swallow Maneuvers: Seated upright 90  degrees;Upright 30-60 min after meal    Other  Recommendations Oral Care Recommendations: Oral care Q4  per protocol Other Recommendations: Order thickener from pharmacy   Follow Up Recommendations  Skilled Nursing facility    Frequency and Duration min 2x/week  2 weeks   Pertinent Vitals/Pain n/a           Reason for Referral Objectively evaluate swallowing function   Oral Phase Oral Preparation/Oral Phase Oral Phase: Impaired Oral - Nectar Oral - Nectar Cup: Piecemeal swallowing;Lingual/palatal residue Oral - Thin Oral - Thin Cup: Piecemeal swallowing;Lingual/palatal residue Oral - Solids Oral - Regular: Weak lingual manipulation;Delayed oral transit   Pharyngeal Phase Pharyngeal Phase Pharyngeal Phase: Impaired Pharyngeal - Nectar Pharyngeal - Nectar Teaspoon: Delayed swallow  initiation;Premature spillage to pyriform sinuses;Pharyngeal  residue - pyriform sinuses;Pharyngeal residue -  valleculae;Reduced tongue base retraction;Reduced laryngeal  elevation Pharyngeal - Nectar Cup: Delayed swallow initiation;Premature  spillage to pyriform sinuses;Pharyngeal residue - pyriform  sinuses;Pharyngeal residue - valleculae;Reduced tongue base  retraction;Reduced laryngeal elevation Pharyngeal - Thin Pharyngeal - Thin Teaspoon: Delayed swallow initiation;Premature  spillage to valleculae;Premature spillage to pyriform   sinuses;Penetration/Aspiration during swallow;Pharyngeal residue  - valleculae;Pharyngeal residue -  pyriform sinuses;Reduced tongue  base retraction;Reduced laryngeal elevation Penetration/Aspiration details (thin teaspoon): Material enters  airway, remains ABOVE vocal cords then ejected out Pharyngeal - Thin Cup: Delayed swallow initiation;Premature  spillage to valleculae;Premature spillage to pyriform  sinuses;Pharyngeal residue - valleculae;Pharyngeal residue -  pyriform sinuses;Reduced laryngeal elevation;Reduced tongue base  retraction;Reduced airway/laryngeal  closure;Penetration/Aspiration during  swallow;Penetration/Aspiration after swallow Penetration/Aspiration details (thin cup): Material enters  airway, remains ABOVE vocal cords and not ejected out Pharyngeal - Solids Pharyngeal - Regular: Pharyngeal residue - pyriform  sinuses;Reduced tongue base retraction  Cervical Esophageal Phase    GO    Cervical Esophageal Phase Cervical Esophageal Phase: Impaired Cervical Esophageal Phase - Comment Cervical Esophageal Comment:  (trace residue upper esophagus)         Darrow Bussing.Ed CCC-SLP Pager [phone]  08/07/2013    Microbiology: Recent Results (from the past 240 hour(s))  CULTURE, BLOOD (ROUTINE X 2)     Status: None   Collection Time    07/31/13  1:57 PM      Result Value Range Status   Specimen Description BLOOD LEFT HAND   Final   Special Requests BOTTLES DRAWN AEROBIC ONLY   Final   Culture  Setup Time     Final   Value: 07/31/2013 22:47     Performed at [facility]   Culture     Final   Value: NO GROWTH 5 DAYS     Performed at [facility]   Report Status 08/06/2013 FINAL   Final  URINE CULTURE     Status: None   Collection Time    07/31/13  2:06 PM      Result Value Range Status   Specimen Description URINE, CATHETERIZED   Final   Special Requests NONE   Final   Culture  Setup Time     Final   Value: 07/31/2013 16:30     Performed at [facility] Count     Final   Value: 5,000 COLONIES/ML     Performed at [facility]   Culture     Final   Value: INSIGNIFICANT GROWTH     Performed at [facility]   Report Status 08/02/2013 FINAL   Final  CULTURE, BLOOD (ROUTINE X 2)     Status: None   Collection Time    07/31/13  3:51 PM      Result Value Range Status   Specimen Description BLOOD LEFT ANTECUBITAL   Final   Special Requests BOTTLES DRAWN AEROBIC ONLY   Final   Culture  Setup Time     Final   Value: 07/31/2013 22:46     Performed at [facility]   Culture     Final   Value: NO GROWTH 5 DAYS     Performed at [facility]   Report Status 08/06/2013 FINAL   Final  MRSA PCR SCREENING     Status: None   Collection Time    07/31/13  7:16 PM      Result Value Range Status   MRSA by PCR NEGATIVE  NEGATIVE Final   Comment:            The GeneXpert MRSA Assay (FDA     approved for NASAL specimens     only), is one component of a     comprehensive MRSA colonization     surveillance program. It is not     intended to diagnose  MRSA     infection nor to guide or     monitor treatment for     MRSA infections.  CULTURE, BLOOD (ROUTINE X 2)     Status: None   Collection Time    08/02/13  5:35 PM      Result Value Range Status   Specimen Description BLOOD RIGHT HAND   Final   Special Requests BOTTLES DRAWN AEROBIC ONLY 5CC   Final   Culture  Setup Time     Final   Value: 08/03/2013 01:57     Performed at [facility]   Culture     Final   Value:        BLOOD CULTURE RECEIVED NO GROWTH TO DATE CULTURE WILL BE HELD FOR 5 DAYS BEFORE ISSUING A FINAL NEGATIVE REPORT     Performed at [facility]   Report Status PENDING   Incomplete  CULTURE, BLOOD (ROUTINE X 2)     Status: None   Collection Time    08/02/13  5:40 PM      Result Value Range Status   Specimen Description BLOOD LEFT HAND   Final   Special Requests BOTTLES DRAWN AEROBIC ONLY 5CC   Final   Culture   Setup Time     Final   Value: 08/03/2013 01:57     Performed at [facility]   Culture     Final   Value:        BLOOD CULTURE RECEIVED NO GROWTH TO DATE CULTURE WILL BE HELD FOR 5 DAYS BEFORE ISSUING A FINAL NEGATIVE REPORT     Performed at [facility]   Report Status PENDING   Incomplete  CULTURE, RESPIRATORY (NON-EXPECTORATED)     Status: None   Collection Time    08/03/13 11:57 AM      Result Value Range Status   Specimen Description TRACHEAL ASPIRATE   Final   Special Requests Normal   Final   Gram Stain     Final   Value: MODERATE WBC PRESENT,BOTH PMN AND MONONUCLEAR     NO SQUAMOUS EPITHELIAL CELLS SEEN     NO ORGANISMS SEEN     Performed at [facility]   Culture     Final   Value: Non-Pathogenic Oropharyngeal-type Flora Isolated.     Performed at [facility]   Report Status 08/05/2013 FINAL   Final     Labs: Basic Metabolic Panel:  Recent Labs Lab 08/02/13 1740 08/03/13 0400 08/03/13 1810 08/04/13 0410 08/05/13 0438 08/06/13 0541 08/07/13 0540  NA  --  147* 146* 145 145 146*  --   K  --  3.4* 3.9 3.6 3.3* 3.5  --   CL  --  112 108 104 95* 99  --   CO2  --  24 25 29  37* 32  --   GLUCOSE  --  99 160* 122* 88 83  --   BUN  --  51* 52* 56* 60* 59*  --   CREATININE  --  3.34* 3.19* 3.05* 2.93* 2.69* 2.71*  CALCIUM  --  8.4 8.3* 8.5 8.5 8.8  --   MG 1.5 1.3* 1.8 2.5 2.3  --   --   PHOS  --  3.1 2.3 2.3  --   --   --    Liver Function Tests: No results found for this basename: AST, ALT, ALKPHOS, BILITOT, PROT, ALBUMIN,  in the last 168 hours No results found for this  basename: LIPASE, AMYLASE,  in the last 168 hours No results found for this basename: AMMONIA,  in the last 168 hours CBC:  Recent Labs Lab 08/03/13 0400 08/03/13 1800 08/04/13 0410 08/05/13 0438 08/06/13 0541  WBC 7.0 7.2 7.6 11.2* 10.1  NEUTROABS  --   --  5.9  --  6.5  HGB 10.3* 11.0* 10.9* 12.7* 13.4  HCT 31.8* 32.7* 32.6* 38.0* 41.2  MCV 90.3 88.1  87.6 88.2 91.6  PLT 136* 125* 146* 167 162   Cardiac Enzymes:  Recent Labs Lab 08/02/13 1740 08/02/13 2320 08/03/13 0400  TROPONINI <0.30 <0.30 <0.30   BNP: BNP (last 3 results)  Recent Labs  01/03/13 0552 04/27/13 1455 08/02/13 1740  PROBNP 602.9* 3460.0* 7170.0*   CBG:  Recent Labs Lab 08/07/13 1616 08/07/13 2000 08/07/13 2359 08/08/13 0408 08/08/13 1235  GLUCAP 154* 175* 95 102* 111*       Signed:  Lokelani Lutes  [facility] 08/08/2013, 2:15 PM

## 2013-08-09 ENCOUNTER — Other Ambulatory Visit (HOSPITAL_COMMUNITY): Payer: Medicare Other

## 2013-08-09 LAB — COMPREHENSIVE METABOLIC PANEL
ALT: 23 U/L (ref 0–53)
AST: 21 U/L (ref 0–37)
Albumin: 2.9 g/dL — ABNORMAL LOW (ref 3.5–5.2)
Alkaline Phosphatase: 94 U/L (ref 39–117)
CO2: 33 mEq/L — ABNORMAL HIGH (ref 19–32)
Calcium: 9.9 mg/dL (ref 8.4–10.5)
Chloride: 103 mEq/L (ref 96–112)
GFR calc non Af Amer: 21 mL/min — ABNORMAL LOW (ref >90)
Glucose, Bld: 90 mg/dL (ref 70–99)
Potassium: 3.5 mEq/L (ref 3.5–5.1)
Sodium: 147 mEq/L — ABNORMAL HIGH (ref 135–145)

## 2013-08-09 LAB — MAGNESIUM: Magnesium: 2.1 mg/dL (ref 1.5–2.5)

## 2013-08-09 LAB — CBC WITH DIFFERENTIAL/PLATELET
Basophils Absolute: 0.1 10*3/uL (ref 0.0–0.1)
Basophils Relative: 1 % (ref 0–1)
Lymphocytes Relative: 23 % (ref 12–46)
Lymphs Abs: 2.2 10*3/uL (ref 0.7–4.0)
MCH: 29 pg (ref 26.0–34.0)
Neutro Abs: 5.6 10*3/uL (ref 1.7–7.7)
Neutrophils Relative %: 59 % (ref 43–77)
Platelets: 144 10*3/uL — ABNORMAL LOW (ref 150–400)
RBC: 4.65 MIL/uL (ref 4.22–5.81)
RDW: 16.1 % — ABNORMAL HIGH (ref 11.5–15.5)
WBC: 9.6 10*3/uL (ref 4.0–10.5)

## 2013-08-09 LAB — CULTURE, BLOOD (ROUTINE X 2)
Culture: NO GROWTH
Culture: NO GROWTH

## 2013-08-09 LAB — PROCALCITONIN: Procalcitonin: <0.1 ng/mL

## 2013-08-11 LAB — CBC
Hemoglobin: 11.9 g/dL — ABNORMAL LOW (ref 13.0–17.0)
MCV: 92.8 fL (ref 78.0–100.0)
Platelets: 119 10*3/uL — ABNORMAL LOW (ref 150–400)
RBC: 4.01 MIL/uL — ABNORMAL LOW (ref 4.22–5.81)
RDW: 15.7 % — ABNORMAL HIGH (ref 11.5–15.5)
WBC: 8.2 10*3/uL (ref 4.0–10.5)

## 2013-08-11 LAB — BASIC METABOLIC PANEL
CO2: 28 mEq/L (ref 19–32)
Calcium: 9.1 mg/dL (ref 8.4–10.5)
GFR calc non Af Amer: 24 mL/min — ABNORMAL LOW (ref >90)
Potassium: 4.1 mEq/L (ref 3.5–5.1)
Sodium: 141 mEq/L (ref 135–145)

## 2013-08-12 LAB — CBC
HCT: 37.4 % — ABNORMAL LOW (ref 39.0–52.0)
Hemoglobin: 11.8 g/dL — ABNORMAL LOW (ref 13.0–17.0)
MCV: 93.3 fL (ref 78.0–100.0)
RBC: 4.01 MIL/uL — ABNORMAL LOW (ref 4.22–5.81)
WBC: 7.9 10*3/uL (ref 4.0–10.5)

## 2013-08-12 LAB — BASIC METABOLIC PANEL
CO2: 24 mEq/L (ref 19–32)
Chloride: 106 mEq/L (ref 96–112)
GFR calc Af Amer: 26 mL/min — ABNORMAL LOW (ref >90)
Potassium: 5.5 mEq/L — ABNORMAL HIGH (ref 3.5–5.1)
Sodium: 140 mEq/L (ref 135–145)

## 2013-08-14 DIAGNOSIS — K921 Melena: Secondary | ICD-10-CM

## 2013-08-14 DIAGNOSIS — D62 Acute posthemorrhagic anemia: Secondary | ICD-10-CM

## 2013-08-14 LAB — CBC WITH DIFFERENTIAL/PLATELET
Basophils Absolute: 0.1 10*3/uL (ref 0.0–0.1)
Basophils Relative: 1 % (ref 0–1)
HCT: 38.2 % — ABNORMAL LOW (ref 39.0–52.0)
MCHC: 30.4 g/dL (ref 30.0–36.0)
Monocytes Absolute: 0.9 10*3/uL (ref 0.1–1.0)
Monocytes Relative: 10 % (ref 3–12)
Neutro Abs: 4.9 10*3/uL (ref 1.7–7.7)
Neutrophils Relative %: 58 % (ref 43–77)
RBC: 4 MIL/uL — ABNORMAL LOW (ref 4.22–5.81)
RDW: 15.8 % — ABNORMAL HIGH (ref 11.5–15.5)

## 2013-08-14 LAB — COMPREHENSIVE METABOLIC PANEL
Alkaline Phosphatase: 77 U/L (ref 39–117)
BUN: 55 mg/dL — ABNORMAL HIGH (ref 6–23)
CO2: 26 mEq/L (ref 19–32)
Chloride: 106 mEq/L (ref 96–112)
GFR calc Af Amer: 28 mL/min — ABNORMAL LOW (ref >90)
GFR calc non Af Amer: 24 mL/min — ABNORMAL LOW (ref >90)
Glucose, Bld: 79 mg/dL (ref 70–99)
Potassium: 4.4 mEq/L (ref 3.5–5.1)
Total Bilirubin: 0.3 mg/dL (ref 0.3–1.2)

## 2013-08-14 LAB — HEMOGLOBIN AND HEMATOCRIT, BLOOD
HCT: 36.3 % — ABNORMAL LOW (ref 39.0–52.0)
HCT: 32.3 % — ABNORMAL LOW (ref 39.0–52.0)
Hemoglobin: 10.2 g/dL — ABNORMAL LOW (ref 13.0–17.0)
Hemoglobin: 11.3 g/dL — ABNORMAL LOW (ref 13.0–17.0)
Hemoglobin: 10.3 g/dL — ABNORMAL LOW (ref 13.0–17.0)

## 2013-08-14 LAB — APTT: aPTT: 26 seconds (ref 24–37)

## 2013-08-14 LAB — PREALBUMIN: Prealbumin: 30.6 mg/dL (ref 17.0–34.0)

## 2013-08-14 NOTE — Consult Note (Addendum)
Consultation  Referring Provider:   [facility]   Primary Care Physician:  [name], MD Primary Gastroenterologist:   Formerly Dr. Virginia Rochester      Reason for Consultation:  Hematochezia            HPI:   [name] is a 79 y.o. male with multiple medical problems. who we saw at [facility] 08/02/13 for rectal bleeding on Lovenox while hospitalized for respiratory failure. During that admission he required 2 units of blood for hgb of 7.8 (baseline around 10.5). Patient felt to be poor candidate for endoscopy given respiratory failure.  Bleeding resolved spontaneously. Patient was discharged to [facility] on 08/08/13.   Today patient developed recurrent hematochezia. He has had 4 episodes of bleeding with clots this am. Hgb down from 11.7 at 6am to 10.8 at 10am. SBP soft around 110. No nausea. No abdominal pain. Last Lovenox was last night.    Past Medical History  Diagnosis Date  . Hypertension   . Arthritis   . Ex-smoker   . Rheumatoid arthritis(714.0)   . Pulmonary nodule   . DDD (degenerative disc disease)   . Complication of anesthesia     " I am difficult to wake up "  . Chronic kidney disease     acute 2013, 2014 on chronic stage 3.  . Anemia   . Proteus septicemia 02/2013  . Barrett's esophagus 04/2006    egd by dr Virginia Rochester.   . Colon polyp 04/2006    Hyperplastic on colonoscopy by Dr Virginia Rochester.   . Encephalopathy, metabolic 02/2013    Past Surgical History  Procedure Laterality Date  . Joint replacement      bilateral knee replacement  . Hernia repair    . Back surgery      X 2  . Appendectomy      Family History  Problem Relation Age of Onset  . Coronary artery disease Brother   . Hypertension Father   . Hypertension Mother      History  Substance Use Topics  . Smoking status: Former Smoker -- 0.50 packs/day for 30 years    Types: Cigarettes    Quit date: 10/12/1973  . Smokeless tobacco: Never Used  . Alcohol Use: No    Prior  to Admission medications   Medication Sig Start Date End Date Taking? Authorizing Provider  albuterol (PROVENTIL) (2.5 MG/3ML) 0.083% nebulizer solution Take 2.5 mg by nebulization every 4 (four) hours as needed. For shortness of breath    Historical Provider, MD  alendronate (FOSAMAX) 70 MG tablet Take 70 mg by mouth every 7 (seven) days. Take with a full glass of water on an empty stomach.  On Friday    Historical Provider, MD  aspirin EC 81 MG tablet Take 81 mg by mouth every morning.     Historical Provider, MD  camphor-menthol Wynelle Fanny) lotion Apply topically as needed for itching. 08/08/13   Nishant Dhungel, MD  Cholecalciferol (VITAMIN D) 2000 UNITS tablet Take 2,000 Units by mouth every morning.     Historical Provider, MD  diphenhydrAMINE (BENADRYL) 50 MG/ML injection Inject 0.25 mLs (12.5 mg total) into the vein every 6 (six) hours as needed for itching. 08/08/13   Nishant Dhungel, MD  escitalopram (LEXAPRO) 10 MG tablet Take 10 mg by mouth daily.    Historical Provider, MD  feeding supplement, ENSURE, (ENSURE) PUDG Take 1 Container by mouth 3 (three) times daily between meals. 08/08/13   Eddie North, MD  gabapentin (NEURONTIN) 300 MG capsule Take 300 mg by mouth at bedtime.    Historical Provider, MD  Maltodextrin-Xanthan Gum (RESOURCE THICKENUP CLEAR) POWD Take 120 g by mouth as needed. 08/08/13   Nishant Dhungel, MD  metoprolol tartrate (LOPRESSOR) 25 MG tablet Take 2 tablets (50 mg total) by mouth 2 (two) times daily. 05/05/13   Elease Etienne, MD  omeprazole (PRILOSEC) 20 MG capsule Take 20 mg by mouth daily.     Historical Provider, MD  predniSONE (DELTASONE) 5 MG tablet Take 5 mg by mouth daily.    Historical Provider, MD  Probiotic Product (ALIGN) 4 MG CAPS Take 1 capsule by mouth every morning.    Historical Provider, MD  SUPER B COMPLEX/C CAPS Take 1 capsule by mouth daily.    Historical Provider, MD  Tamsulosin HCl (FLOMAX) 0.4 MG CAPS Take 1 capsule (0.4 mg total) by mouth  daily after supper. 06/13/12   Vassie Loll, MD  traMADol-acetaminophen (ULTRACET) 37.5-325 MG per tablet Take 1 tablet by mouth every 6 (six) hours as needed for pain. 05/05/13   Elease Etienne, MD  trimethoprim (TRIMPEX) 100 MG tablet Take 100 mg by mouth daily.    Historical Provider, MD    Current Medications:   Insulin, aspirin 81mg  daily, lexapro, pepcid, gabapentin, metoprolol, prednisone, probiotic, flomax, thera plus, vitamin D. Multiple prn meds including magnesium,  potassium tylenol,  albuterol, nitrostat, zofran,  senna plus, sodium bicarbonate  Allergies as of 08/08/2013 - Review Complete 07/31/2013  Allergen Reaction Noted  . Codeine Other (See Comments) 05/15/2009  . Hydrocodone Other (See Comments) 05/08/2012  . Oxycodone Other (See Comments) 05/08/2012  . Penicillins Hives 05/15/2009  . Sulfa antibiotics Other (See Comments) 01/03/2013   Review of Systems:    All systems reviewed and negative except where noted in HPI.  Gen: Denies any fever, chills, sweats, anorexia, fatigue, weakness, malaise, weight loss, and sleep disorder CV: Denies chest pain, angina, palpitations, syncope, orthopnea, PND, peripheral edema, and claudication. Resp: Denies dyspnea at rest, dyspnea with exercise, cough, sputum, wheezing, coughing up blood, and pleurisy. GI: Denies vomiting blood, jaundice, and fecal incontinence.   Denies dysphagia or odynophagia. GU : Denies urinary burning, blood in urine, urinary frequency, urinary hesitancy, nocturnal urination, and urinary incontinence. MS: Denies joint pain, limitation of movement, and swelling, stiffness, low back pain, extremity pain. Denies muscle weakness, cramps, atrophy.  Derm: Denies rash, itching, dry skin, hives, moles, warts, or unhealing ulcers.  Psych: Denies depression, anxiety, memory loss, suicidal ideation, hallucinations, paranoia, and confusion. Heme: Denies bruising, bleeding, and enlarged lymph nodes. Neuro:  Denies any  headaches, dizziness, paresthesias. Endo:  Denies any problems with DM, thyroid, adrenal function.    Physical Exam:  Vital signs in last 24 hours:   BP 104/60, HR 74, Respirations 20.    General:   Pleasant white male in NAD Head:  Normocephalic and atraumatic. Eyes:   No icterus.   Conjunctiva pink. Ears:  Normal auditory acuity. Neck:  Supple; no masses felt Lungs:  Respirations even and unlabored. Bilateral lower lobes with decreased breath sounds and a few inspiratory crackles.   Heart:  Regular rate and rhythm Abdomen:  Soft, nondistended, nontender. Normal bowel sounds. No appreciable masses or hepatomegaly.  Rectal:  Bright red blood in vault and oozing from anus.   Msk:  Symmetrical without gross deformities.  Extremities:  Without edema. Neurologic:  Alert and  oriented x4;  grossly normal neurologically. Skin:  Intact without significant lesions or rashes. Cervical  Nodes:  No significant cervical adenopathy. Psych:  Alert and cooperative. Normal affect.  LAB RESULTS:  Recent Labs  08/12/13 0500 08/14/13 0555  WBC 7.9 8.5  HGB 11.8* 11.6*  HCT 37.4* 38.2*  PLT 111* 121*   BMET  Recent Labs  08/12/13 0500 08/13/13 1031 08/14/13 0555  NA 140  --  142  K 5.5* 4.1 4.4  CL 106  --  106  CO2 24  --  26  GLUCOSE 79  --  79  BUN 62*  --  55*  CREATININE 2.49*  --  2.35*  CALCIUM 9.1  --  9.2   LFT  Recent Labs  08/14/13 0555  PROT 5.7*  ALBUMIN 2.4*  AST 22  ALT 29  ALKPHOS 77  BILITOT 0.3    PREVIOUS ENDOSCOPIES:            colonoscopy Aug 2007 Virginia Rochester). Exam to cecum  FINDINGS: Small polyp of rectum, otherwise unremarkable colonoscopic  examination to cecum. Polyp = hyperplastic  EGD August 2007 Virginia Rochester).  FINDINGS: Barrett's esophagus biopsied. Await biopsy report. The patient  will call me for results and follow-up with me as an outpatient. Proceed to  colonoscopy as planned. Esophageal biopsy c/w Barrett's.    Impression / Plan:   1.  Recurrent hematochezia on Lovenox.    Transfer patient to [facility] if becomes hemodynamically unstable.   Reduce diet to nectar thick clears  Agree with serial H&H  Patient has 4 units of blood ordered. Would give a unit and hold other three for now  Cancel FFP, he isn't coagulopathic and FFP not an antidote to lovenox  Agree with holding lovenox  Make sure patient peripheral IV's  Start NS at 166ml/hr x one liter.   Will prep for colonoscopy to be done in am. Given swallowing limitations will likely need NGT for bowel prep.   2. Anemia of acute blood loss, recurrent. See #1.   3. History of Barretts esophagus, On daily PPI.   4. Multiple medical problems including, but not limited to history of diastolic heart failure / aspiration PNA / COPD / CKD / RA. Patient intubated during recent admission. Currently on 02 per nasal cannula.     Thanks   LOS: 6 days   Willette Cluster  08/14/2013, 10:49 AM    ________________________________________________________________________  [facility] MD note:  I personally examined the patient, reviewed the data and agree with the assessment and plan described above.  Likely lower GI bleed. Planning on colonoscopy tomorrow AM (if daughter who is POA, agrees).  Transfuse PRN.  Hold all blood thinners for now.   Rob Bunting, MD [facility] Pager [phone]

## 2013-08-15 ENCOUNTER — Encounter
Admission: AD | Disposition: A | Discharge status: Home Health | Payer: Self-pay | Source: Ambulatory Visit | Attending: Internal Medicine

## 2013-08-15 LAB — BASIC METABOLIC PANEL
CO2: 28 mEq/L (ref 19–32)
Calcium: 8.9 mg/dL (ref 8.4–10.5)
Creatinine, Ser: 2.42 mg/dL — ABNORMAL HIGH (ref 0.50–1.35)
GFR calc Af Amer: 27 mL/min — ABNORMAL LOW (ref >90)
GFR calc non Af Amer: 23 mL/min — ABNORMAL LOW (ref >90)
Glucose, Bld: 79 mg/dL (ref 70–99)

## 2013-08-15 LAB — CBC
MCH: 27.9 pg (ref 26.0–34.0)
MCHC: 31.7 g/dL (ref 30.0–36.0)
MCV: 88.1 fL (ref 78.0–100.0)
Platelets: 120 10*3/uL — ABNORMAL LOW (ref 150–400)
RBC: 4.3 MIL/uL (ref 4.22–5.81)

## 2013-08-15 LAB — HEMOGLOBIN AND HEMATOCRIT, BLOOD
HCT: 40 % (ref 39.0–52.0)
Hemoglobin: 12.9 g/dL — ABNORMAL LOW (ref 13.0–17.0)

## 2013-08-15 SURGERY — COLONOSCOPY
Anesthesia: Moderate Sedation

## 2013-08-15 NOTE — Progress Notes (Signed)
[facility] Progress Note    Since last GI note: POA (daughter) declined invasive procedure, colonoscopy to workup/treat the bleeding.  ASA, lovenox have been stopped. OVernight, less bleeding per RN. He received one unit blood, with good bump in Hb  Objective: Vital signs in last 24 hours:     General: alert and oriented times 2 or 3 Heart: regular rate and rythm Abdomen: soft, non-tender, non-distended, normal bowel sounds   Lab Results:  Recent Labs  08/14/13 0555  08/14/13 1930 08/14/13 2109 08/15/13 0710  WBC 8.5  --   --   --  9.5  HGB 11.6*  < > 11.3* 10.3* 12.0*  PLT 121*  --   --   --  120*  MCV 95.5  --   --   --  88.1  < > = values in this interval not displayed.  Recent Labs  08/13/13 1031 08/14/13 0555 08/15/13 0710  NA  --  142 144  K 4.1 4.4 5.1  CL  --  106 109  CO2  --  26 28  GLUCOSE  --  79 79  BUN  --  55* 51*  CREATININE  --  2.35* 2.42*  CALCIUM  --  9.2 8.9    Recent Labs  08/14/13 0555  PROT 5.7*  ALBUMIN 2.4*  AST 22  ALT 29  ALKPHOS 77  BILITOT 0.3    Recent Labs  08/14/13 1010  INR 0.97    Meds: list reviewed at [facility].      Assessment/Plan: 79 y.o. male with GI bleeding, likely lower.  Colonsocopy is usual next step in workup however daughter (POA) declines.  ASA and lovenox stopped and they should not be restarted.  Please call if any further questions or concerns.  OK to return him to his usual diet.    Rachael Fee, MD  08/15/2013, 10:09 AM  Gastroenterology Pager [phone]

## 2013-08-16 LAB — BASIC METABOLIC PANEL
BUN: 49 mg/dL — ABNORMAL HIGH (ref 6–23)
CO2: 29 mEq/L (ref 19–32)
Calcium: 8.8 mg/dL (ref 8.4–10.5)
Chloride: 109 mEq/L (ref 96–112)
Creatinine, Ser: 2.6 mg/dL — ABNORMAL HIGH (ref 0.50–1.35)
GFR calc non Af Amer: 21 mL/min — ABNORMAL LOW (ref >90)

## 2013-08-16 LAB — HEMOGLOBIN AND HEMATOCRIT, BLOOD
HCT: 37.2 % — ABNORMAL LOW (ref 39.0–52.0)
Hemoglobin: 11.8 g/dL — ABNORMAL LOW (ref 13.0–17.0)

## 2013-08-17 ENCOUNTER — Other Ambulatory Visit (HOSPITAL_COMMUNITY): Payer: Medicare Other

## 2013-08-17 LAB — BASIC METABOLIC PANEL
BUN: 49 mg/dL — ABNORMAL HIGH (ref 6–23)
Chloride: 108 mEq/L (ref 96–112)
Creatinine, Ser: 2.66 mg/dL — ABNORMAL HIGH (ref 0.50–1.35)
Glucose, Bld: 78 mg/dL (ref 70–99)
Potassium: 4.9 mEq/L (ref 3.5–5.1)

## 2013-08-17 LAB — TYPE AND SCREEN
ABO/RH(D): B POS
Antibody Screen: NEGATIVE
Unit division: 0
Unit division: 0

## 2013-08-18 LAB — BASIC METABOLIC PANEL
BUN: 47 mg/dL — ABNORMAL HIGH (ref 6–23)
CO2: 24 mEq/L (ref 19–32)
Calcium: 8.7 mg/dL (ref 8.4–10.5)
Creatinine, Ser: 2.18 mg/dL — ABNORMAL HIGH (ref 0.50–1.35)
GFR calc non Af Amer: 26 mL/min — ABNORMAL LOW (ref >90)
Glucose, Bld: 74 mg/dL (ref 70–99)
Potassium: 5.2 mEq/L — ABNORMAL HIGH (ref 3.5–5.1)

## 2013-08-18 LAB — CBC
HCT: 34 % — ABNORMAL LOW (ref 39.0–52.0)
Hemoglobin: 11.1 g/dL — ABNORMAL LOW (ref 13.0–17.0)
MCH: 28.6 pg (ref 26.0–34.0)
MCHC: 32.6 g/dL (ref 30.0–36.0)
MCV: 87.6 fL (ref 78.0–100.0)
WBC: 7.1 10*3/uL (ref 4.0–10.5)

## 2013-08-18 LAB — PHOSPHORUS: Phosphorus: 2.3 mg/dL (ref 2.3–4.6)

## 2013-08-19 LAB — CBC
HCT: 37.5 % — ABNORMAL LOW (ref 39.0–52.0)
Hemoglobin: 11.8 g/dL — ABNORMAL LOW (ref 13.0–17.0)
MCH: 28 pg (ref 26.0–34.0)
MCHC: 31.5 g/dL (ref 30.0–36.0)
MCV: 88.9 fL (ref 78.0–100.0)
Platelets: 151 10*3/uL (ref 150–400)
RBC: 4.22 MIL/uL (ref 4.22–5.81)

## 2013-08-19 LAB — BASIC METABOLIC PANEL
BUN: 44 mg/dL — ABNORMAL HIGH (ref 6–23)
CO2: 29 mEq/L (ref 19–32)
Calcium: 9.1 mg/dL (ref 8.4–10.5)
Chloride: 106 mEq/L (ref 96–112)
Creatinine, Ser: 2.41 mg/dL — ABNORMAL HIGH (ref 0.50–1.35)
GFR calc non Af Amer: 23 mL/min — ABNORMAL LOW (ref >90)
Glucose, Bld: 74 mg/dL (ref 70–99)

## 2013-08-21 LAB — URINALYSIS, ROUTINE W REFLEX MICROSCOPIC
Ketones, ur: NEGATIVE mg/dL
Leukocytes, UA: NEGATIVE
Nitrite: NEGATIVE
Protein, ur: NEGATIVE mg/dL
Urobilinogen, UA: 0.2 mg/dL (ref 0.0–1.0)

## 2013-08-21 LAB — CBC
HCT: 37.2 % — ABNORMAL LOW (ref 39.0–52.0)
MCH: 28 pg (ref 26.0–34.0)
MCHC: 32.3 g/dL (ref 30.0–36.0)
MCV: 86.9 fL (ref 78.0–100.0)
Platelets: 163 10*3/uL (ref 150–400)
RDW: 18.8 % — ABNORMAL HIGH (ref 11.5–15.5)
WBC: 7.7 10*3/uL (ref 4.0–10.5)

## 2013-08-21 LAB — BASIC METABOLIC PANEL
Calcium: 8.9 mg/dL (ref 8.4–10.5)
Creatinine, Ser: 2.51 mg/dL — ABNORMAL HIGH (ref 0.50–1.35)
GFR calc Af Amer: 26 mL/min — ABNORMAL LOW (ref >90)
GFR calc non Af Amer: 22 mL/min — ABNORMAL LOW (ref >90)

## 2013-08-22 LAB — URINE CULTURE: Colony Count: NO GROWTH

## 2013-08-23 LAB — BASIC METABOLIC PANEL
BUN: 49 mg/dL — ABNORMAL HIGH (ref 6–23)
CO2: 25 mEq/L (ref 19–32)
Creatinine, Ser: 2.76 mg/dL — ABNORMAL HIGH (ref 0.50–1.35)
GFR calc non Af Amer: 20 mL/min — ABNORMAL LOW (ref >90)
Glucose, Bld: 78 mg/dL (ref 70–99)
Potassium: 4.4 mEq/L (ref 3.5–5.1)
Sodium: 142 mEq/L (ref 135–145)

## 2013-08-24 LAB — BASIC METABOLIC PANEL
CO2: 24 mEq/L (ref 19–32)
Calcium: 8.6 mg/dL (ref 8.4–10.5)
GFR calc Af Amer: 25 mL/min — ABNORMAL LOW (ref >90)
Glucose, Bld: 72 mg/dL (ref 70–99)
Potassium: 4.8 mEq/L (ref 3.5–5.1)
Sodium: 140 mEq/L (ref 135–145)

## 2013-08-24 LAB — CBC
Hemoglobin: 11.2 g/dL — ABNORMAL LOW (ref 13.0–17.0)
MCH: 28.1 pg (ref 26.0–34.0)
Platelets: 143 10*3/uL — ABNORMAL LOW (ref 150–400)
RBC: 3.98 MIL/uL — ABNORMAL LOW (ref 4.22–5.81)
RDW: 18.7 % — ABNORMAL HIGH (ref 11.5–15.5)

## 2013-08-25 LAB — CBC
HCT: 36 % — ABNORMAL LOW (ref 39.0–52.0)
Hemoglobin: 11.5 g/dL — ABNORMAL LOW (ref 13.0–17.0)
MCH: 28.3 pg (ref 26.0–34.0)
MCV: 88.5 fL (ref 78.0–100.0)
RBC: 4.07 MIL/uL — ABNORMAL LOW (ref 4.22–5.81)

## 2013-08-25 LAB — BASIC METABOLIC PANEL
BUN: 56 mg/dL — ABNORMAL HIGH (ref 6–23)
CO2: 21 mEq/L (ref 19–32)
Calcium: 8.7 mg/dL (ref 8.4–10.5)
Creatinine, Ser: 3.02 mg/dL — ABNORMAL HIGH (ref 0.50–1.35)
Glucose, Bld: 72 mg/dL (ref 70–99)

## 2013-08-26 LAB — BASIC METABOLIC PANEL
BUN: 59 mg/dL — ABNORMAL HIGH (ref 6–23)
Calcium: 9.2 mg/dL (ref 8.4–10.5)
Creatinine, Ser: 2.7 mg/dL — ABNORMAL HIGH (ref 0.50–1.35)
GFR calc Af Amer: 24 mL/min — ABNORMAL LOW (ref >90)

## 2013-08-26 LAB — CBC
Hemoglobin: 11 g/dL — ABNORMAL LOW (ref 13.0–17.0)
MCH: 27.6 pg (ref 26.0–34.0)
MCV: 88.5 fL (ref 78.0–100.0)
Platelets: 156 10*3/uL (ref 150–400)
RDW: 19 % — ABNORMAL HIGH (ref 11.5–15.5)

## 2013-08-27 LAB — BASIC METABOLIC PANEL
BUN: 57 mg/dL — ABNORMAL HIGH (ref 6–23)
CO2: 27 mEq/L (ref 19–32)
Calcium: 9 mg/dL (ref 8.4–10.5)
Chloride: 106 mEq/L (ref 96–112)
Creatinine, Ser: 3.01 mg/dL — ABNORMAL HIGH (ref 0.50–1.35)
GFR calc Af Amer: 21 mL/min — ABNORMAL LOW (ref >90)
GFR calc non Af Amer: 18 mL/min — ABNORMAL LOW (ref >90)
Glucose, Bld: 82 mg/dL (ref 70–99)
Potassium: 5.4 mEq/L — ABNORMAL HIGH (ref 3.5–5.1)
Sodium: 142 mEq/L (ref 135–145)

## 2013-08-28 LAB — CBC WITH DIFFERENTIAL/PLATELET
Basophils Absolute: 0 10*3/uL (ref 0.0–0.1)
Eosinophils Absolute: 0.6 10*3/uL (ref 0.0–0.7)
Eosinophils Relative: 8 % — ABNORMAL HIGH (ref 0–5)
Hemoglobin: 11.1 g/dL — ABNORMAL LOW (ref 13.0–17.0)
Lymphocytes Relative: 23 % (ref 12–46)
Lymphs Abs: 1.7 10*3/uL (ref 0.7–4.0)
MCH: 28.8 pg (ref 26.0–34.0)
Monocytes Absolute: 0.7 10*3/uL (ref 0.1–1.0)
Neutrophils Relative %: 58 % (ref 43–77)
Platelets: 120 10*3/uL — ABNORMAL LOW (ref 150–400)
RBC: 3.86 MIL/uL — ABNORMAL LOW (ref 4.22–5.81)
RDW: 19.1 % — ABNORMAL HIGH (ref 11.5–15.5)
WBC: 7.2 10*3/uL (ref 4.0–10.5)

## 2013-08-28 LAB — COMPREHENSIVE METABOLIC PANEL
ALT: 13 U/L (ref 0–53)
AST: 17 U/L (ref 0–37)
Albumin: 2.3 g/dL — ABNORMAL LOW (ref 3.5–5.2)
Alkaline Phosphatase: 73 U/L (ref 39–117)
BUN: 55 mg/dL — ABNORMAL HIGH (ref 6–23)
CO2: 22 mEq/L (ref 19–32)
Calcium: 8.7 mg/dL (ref 8.4–10.5)
Creatinine, Ser: 2.86 mg/dL — ABNORMAL HIGH (ref 0.50–1.35)
Glucose, Bld: 82 mg/dL (ref 70–99)
Potassium: 4.8 mEq/L (ref 3.5–5.1)
Sodium: 140 mEq/L (ref 135–145)
Total Protein: 5.2 g/dL — ABNORMAL LOW (ref 6.0–8.3)

## 2013-08-28 LAB — PREALBUMIN: Prealbumin: 25.9 mg/dL (ref 17.0–34.0)

## 2013-08-29 LAB — CBC
HCT: 33.3 % — ABNORMAL LOW (ref 39.0–52.0)
Hemoglobin: 10.7 g/dL — ABNORMAL LOW (ref 13.0–17.0)
MCH: 28.7 pg (ref 26.0–34.0)
MCHC: 32.1 g/dL (ref 30.0–36.0)
MCV: 89.3 fL (ref 78.0–100.0)
RBC: 3.73 MIL/uL — ABNORMAL LOW (ref 4.22–5.81)
RDW: 19.1 % — ABNORMAL HIGH (ref 11.5–15.5)

## 2013-08-30 LAB — BASIC METABOLIC PANEL
BUN: 65 mg/dL — ABNORMAL HIGH (ref 6–23)
CO2: 25 mEq/L (ref 19–32)
Calcium: 9 mg/dL (ref 8.4–10.5)
Creatinine, Ser: 2.92 mg/dL — ABNORMAL HIGH (ref 0.50–1.35)
GFR calc Af Amer: 21 mL/min — ABNORMAL LOW (ref >90)
GFR calc non Af Amer: 18 mL/min — ABNORMAL LOW (ref >90)
Glucose, Bld: 73 mg/dL (ref 70–99)

## 2013-08-31 LAB — CBC
Hemoglobin: 10.9 g/dL — ABNORMAL LOW (ref 13.0–17.0)
MCH: 28.2 pg (ref 26.0–34.0)
MCHC: 31.7 g/dL (ref 30.0–36.0)
MCV: 88.9 fL (ref 78.0–100.0)
Platelets: 135 10*3/uL — ABNORMAL LOW (ref 150–400)
RBC: 3.87 MIL/uL — ABNORMAL LOW (ref 4.22–5.81)
RDW: 19 % — ABNORMAL HIGH (ref 11.5–15.5)

## 2013-08-31 LAB — BASIC METABOLIC PANEL
CO2: 25 mEq/L (ref 19–32)
Calcium: 8.8 mg/dL (ref 8.4–10.5)
Creatinine, Ser: 2.77 mg/dL — ABNORMAL HIGH (ref 0.50–1.35)
GFR calc non Af Amer: 20 mL/min — ABNORMAL LOW (ref >90)
Glucose, Bld: 61 mg/dL — ABNORMAL LOW (ref 70–99)
Sodium: 142 mEq/L (ref 135–145)

## 2013-09-01 LAB — BASIC METABOLIC PANEL
BUN: 56 mg/dL — ABNORMAL HIGH (ref 6–23)
CO2: 29 mEq/L (ref 19–32)
Chloride: 105 mEq/L (ref 96–112)
GFR calc non Af Amer: 21 mL/min — ABNORMAL LOW (ref >90)
Glucose, Bld: 78 mg/dL (ref 70–99)
Potassium: 4.4 mEq/L (ref 3.5–5.1)

## 2013-09-01 LAB — CBC
HCT: 33.5 % — ABNORMAL LOW (ref 39.0–52.0)
MCH: 28.8 pg (ref 26.0–34.0)
MCHC: 32.8 g/dL (ref 30.0–36.0)
RBC: 3.82 MIL/uL — ABNORMAL LOW (ref 4.22–5.81)
WBC: 5.1 10*3/uL (ref 4.0–10.5)

## 2013-09-20 ENCOUNTER — Encounter (HOSPITAL_COMMUNITY): Payer: Self-pay | Admitting: Emergency Medicine

## 2013-09-20 ENCOUNTER — Emergency Department (HOSPITAL_COMMUNITY): Payer: Medicare Other

## 2013-09-20 ENCOUNTER — Inpatient Hospital Stay (HOSPITAL_COMMUNITY)
Admission: EM | Admit: 2013-09-20 | Discharge: 2013-10-02 | DRG: 291 | Disposition: A | Discharge status: Home Health | Payer: Medicare Other | Attending: Internal Medicine | Admitting: Internal Medicine

## 2013-09-20 DIAGNOSIS — J4489 Other specified chronic obstructive pulmonary disease: Secondary | ICD-10-CM | POA: Diagnosis present

## 2013-09-20 DIAGNOSIS — I129 Hypertensive chronic kidney disease with stage 1 through stage 4 chronic kidney disease, or unspecified chronic kidney disease: Secondary | ICD-10-CM | POA: Diagnosis present

## 2013-09-20 DIAGNOSIS — D649 Anemia, unspecified: Secondary | ICD-10-CM | POA: Diagnosis present

## 2013-09-20 DIAGNOSIS — I5033 Acute on chronic diastolic (congestive) heart failure: Principal | ICD-10-CM | POA: Diagnosis present

## 2013-09-20 DIAGNOSIS — E41 Nutritional marasmus: Secondary | ICD-10-CM | POA: Diagnosis present

## 2013-09-20 DIAGNOSIS — G894 Chronic pain syndrome: Secondary | ICD-10-CM

## 2013-09-20 DIAGNOSIS — Z6825 Body mass index (BMI) 25.0-25.9, adult: Secondary | ICD-10-CM

## 2013-09-20 DIAGNOSIS — M069 Rheumatoid arthritis, unspecified: Secondary | ICD-10-CM | POA: Diagnosis present

## 2013-09-20 DIAGNOSIS — Z515 Encounter for palliative care: Secondary | ICD-10-CM

## 2013-09-20 DIAGNOSIS — N183 Chronic kidney disease, stage 3 unspecified: Secondary | ICD-10-CM

## 2013-09-20 DIAGNOSIS — R5381 Other malaise: Secondary | ICD-10-CM

## 2013-09-20 DIAGNOSIS — N1330 Unspecified hydronephrosis: Secondary | ICD-10-CM | POA: Diagnosis present

## 2013-09-20 DIAGNOSIS — Z96659 Presence of unspecified artificial knee joint: Secondary | ICD-10-CM

## 2013-09-20 DIAGNOSIS — Z87891 Personal history of nicotine dependence: Secondary | ICD-10-CM

## 2013-09-20 DIAGNOSIS — R0902 Hypoxemia: Secondary | ICD-10-CM

## 2013-09-20 DIAGNOSIS — M542 Cervicalgia: Secondary | ICD-10-CM

## 2013-09-20 DIAGNOSIS — E870 Hyperosmolality and hypernatremia: Secondary | ICD-10-CM

## 2013-09-20 DIAGNOSIS — J449 Chronic obstructive pulmonary disease, unspecified: Secondary | ICD-10-CM

## 2013-09-20 DIAGNOSIS — R627 Adult failure to thrive: Secondary | ICD-10-CM | POA: Diagnosis present

## 2013-09-20 DIAGNOSIS — Z87440 Personal history of urinary (tract) infections: Secondary | ICD-10-CM

## 2013-09-20 DIAGNOSIS — N179 Acute kidney failure, unspecified: Secondary | ICD-10-CM

## 2013-09-20 DIAGNOSIS — R519 Headache, unspecified: Secondary | ICD-10-CM

## 2013-09-20 DIAGNOSIS — I10 Essential (primary) hypertension: Secondary | ICD-10-CM | POA: Diagnosis present

## 2013-09-20 DIAGNOSIS — R51 Headache: Secondary | ICD-10-CM | POA: Diagnosis present

## 2013-09-20 DIAGNOSIS — J9602 Acute respiratory failure with hypercapnia: Secondary | ICD-10-CM

## 2013-09-20 DIAGNOSIS — R0600 Dyspnea, unspecified: Secondary | ICD-10-CM

## 2013-09-20 DIAGNOSIS — I509 Heart failure, unspecified: Secondary | ICD-10-CM

## 2013-09-20 DIAGNOSIS — Z7982 Long term (current) use of aspirin: Secondary | ICD-10-CM

## 2013-09-20 DIAGNOSIS — R197 Diarrhea, unspecified: Secondary | ICD-10-CM

## 2013-09-20 LAB — CBC WITH DIFFERENTIAL/PLATELET
Basophils Absolute: 0 10*3/uL (ref 0.0–0.1)
Basophils Relative: 0 % (ref 0–1)
Eosinophils Absolute: 0.1 10*3/uL (ref 0.0–0.7)
Eosinophils Relative: 1 % (ref 0–5)
Hemoglobin: 10.3 g/dL — ABNORMAL LOW (ref 13.0–17.0)
MCH: 28.6 pg (ref 26.0–34.0)
MCHC: 30.1 g/dL (ref 30.0–36.0)
Monocytes Relative: 9 % (ref 3–12)
Neutro Abs: 9.8 10*3/uL — ABNORMAL HIGH (ref 1.7–7.7)
Neutrophils Relative %: 80 % — ABNORMAL HIGH (ref 43–77)
Platelets: 127 10*3/uL — ABNORMAL LOW (ref 150–400)
RBC: 3.6 MIL/uL — ABNORMAL LOW (ref 4.22–5.81)
RDW: 19.5 % — ABNORMAL HIGH (ref 11.5–15.5)

## 2013-09-20 LAB — PRO B NATRIURETIC PEPTIDE: Pro B Natriuretic peptide (BNP): 1238 pg/mL — ABNORMAL HIGH (ref 0–450)

## 2013-09-20 LAB — COMPREHENSIVE METABOLIC PANEL
ALT: 22 U/L (ref 0–53)
AST: 14 U/L (ref 0–37)
Albumin: 2.9 g/dL — ABNORMAL LOW (ref 3.5–5.2)
Alkaline Phosphatase: 75 U/L (ref 39–117)
Chloride: 103 mEq/L (ref 96–112)
GFR calc Af Amer: 31 mL/min — ABNORMAL LOW (ref >90)
Potassium: 5.1 mEq/L (ref 3.5–5.1)
Sodium: 145 mEq/L (ref 135–145)
Total Bilirubin: 0.5 mg/dL (ref 0.3–1.2)
Total Protein: 5.9 g/dL — ABNORMAL LOW (ref 6.0–8.3)

## 2013-09-20 LAB — PROTIME-INR
INR: 0.98 (ref 0.00–1.49)
Prothrombin Time: 12.8 seconds (ref 11.6–15.2)

## 2013-09-20 LAB — POCT I-STAT TROPONIN I: Troponin i, poc: 0 ng/mL (ref 0.00–0.08)

## 2013-09-20 MED ORDER — SODIUM CHLORIDE 0.9 % IV SOLN
INTRAVENOUS | Status: DC
  Administered 2013-09-20: 800 mL via INTRAVENOUS
  Administered 2013-09-20: 18:00:00 via INTRAVENOUS

## 2013-09-20 MED ORDER — PREDNISONE 20 MG PO TABS
20.0000 mg | ORAL_TABLET | Freq: Two times a day (BID) | ORAL | Status: AC
  Administered 2013-09-20 – 2013-09-27 (×14): 20 mg via ORAL
  Filled 2013-09-20 (×15): qty 1

## 2013-09-20 MED ORDER — ONDANSETRON HCL 4 MG/2ML IJ SOLN
4.0000 mg | Freq: Once | INTRAMUSCULAR | Status: AC
  Administered 2013-09-20: 4 mg via INTRAVENOUS
  Filled 2013-09-20: qty 2

## 2013-09-20 MED ORDER — TORSEMIDE 20 MG PO TABS
20.0000 mg | ORAL_TABLET | Freq: Every day | ORAL | Status: DC
  Administered 2013-09-21: 20 mg via ORAL
  Filled 2013-09-20: qty 1

## 2013-09-20 MED ORDER — TAMSULOSIN HCL 0.4 MG PO CAPS
0.4000 mg | ORAL_CAPSULE | Freq: Every day | ORAL | Status: DC
  Administered 2013-09-21 – 2013-10-02 (×13): 0.4 mg via ORAL
  Filled 2013-09-20 (×12): qty 1

## 2013-09-20 MED ORDER — DOCUSATE SODIUM 100 MG PO CAPS
100.0000 mg | ORAL_CAPSULE | Freq: Two times a day (BID) | ORAL | Status: DC
  Administered 2013-09-20 – 2013-09-30 (×19): 100 mg via ORAL
  Filled 2013-09-20 (×15): qty 1

## 2013-09-20 MED ORDER — RISAQUAD PO CAPS
1.0000 | ORAL_CAPSULE | Freq: Two times a day (BID) | ORAL | Status: DC
  Administered 2013-09-20 – 2013-10-02 (×24): 1 via ORAL
  Filled 2013-09-20 (×26): qty 1

## 2013-09-20 MED ORDER — HYDROMORPHONE HCL PF 1 MG/ML IJ SOLN
0.5000 mg | Freq: Once | INTRAMUSCULAR | Status: AC
  Administered 2013-09-20: 0.5 mg via INTRAVENOUS
  Filled 2013-09-20: qty 1

## 2013-09-20 MED ORDER — ONDANSETRON HCL 4 MG PO TABS: 4.0000 mg | ORAL_TABLET | Freq: Four times a day (QID) | ORAL | Status: DC | PRN

## 2013-09-20 MED ORDER — ESCITALOPRAM OXALATE 10 MG PO TABS
10.0000 mg | ORAL_TABLET | Freq: Every day | ORAL | Status: DC
  Administered 2013-09-21 – 2013-10-02 (×12): 10 mg via ORAL
  Filled 2013-09-20 (×12): qty 1

## 2013-09-20 MED ORDER — PANTOPRAZOLE SODIUM 40 MG PO TBEC
40.0000 mg | DELAYED_RELEASE_TABLET | Freq: Every day | ORAL | Status: DC
  Administered 2013-09-21 – 2013-10-02 (×12): 40 mg via ORAL
  Filled 2013-09-20 (×10): qty 1

## 2013-09-20 MED ORDER — LORATADINE 10 MG PO TABS
10.0000 mg | ORAL_TABLET | Freq: Every day | ORAL | Status: DC
  Administered 2013-09-21 – 2013-10-02 (×12): 10 mg via ORAL
  Filled 2013-09-20 (×12): qty 1

## 2013-09-20 MED ORDER — ASPIRIN EC 81 MG PO TBEC
81.0000 mg | DELAYED_RELEASE_TABLET | Freq: Every morning | ORAL | Status: DC
  Administered 2013-09-21 – 2013-10-02 (×12): 81 mg via ORAL
  Filled 2013-09-20 (×12): qty 1

## 2013-09-20 MED ORDER — HEPARIN SODIUM (PORCINE) 5000 UNIT/ML IJ SOLN
5000.0000 [IU] | Freq: Three times a day (TID) | INTRAMUSCULAR | Status: DC
  Administered 2013-09-23 – 2013-10-02 (×21): 5000 [IU] via SUBCUTANEOUS
  Filled 2013-09-20 (×38): qty 1

## 2013-09-20 MED ORDER — METOPROLOL TARTRATE 25 MG PO TABS
25.0000 mg | ORAL_TABLET | Freq: Two times a day (BID) | ORAL | Status: DC
  Administered 2013-09-20 – 2013-10-01 (×23): 25 mg via ORAL
  Filled 2013-09-20 (×26): qty 1

## 2013-09-20 MED ORDER — ONDANSETRON HCL 4 MG/2ML IJ SOLN: 4.0000 mg | Freq: Four times a day (QID) | INTRAMUSCULAR | Status: DC | PRN

## 2013-09-20 MED ORDER — HYDROCORTISONE 1 % EX CREA
1.0000 "application " | TOPICAL_CREAM | Freq: Every day | CUTANEOUS | Status: DC | PRN
  Administered 2013-09-20: 1 via TOPICAL
  Filled 2013-09-20: qty 28

## 2013-09-20 MED ORDER — ALENDRONATE SODIUM 70 MG PO TABS: 70.0000 mg | ORAL_TABLET | ORAL | Status: DC

## 2013-09-20 MED ORDER — HYDROMORPHONE HCL PF 1 MG/ML IJ SOLN
0.5000 mg | INTRAMUSCULAR | Status: DC | PRN
  Administered 2013-09-21 – 2013-09-27 (×20): 0.5 mg via INTRAVENOUS
  Filled 2013-09-20 (×20): qty 1

## 2013-09-20 MED ORDER — ALBUTEROL SULFATE (5 MG/ML) 0.5% IN NEBU
2.5000 mg | INHALATION_SOLUTION | RESPIRATORY_TRACT | Status: DC | PRN
  Administered 2013-09-21: 2.5 mg via RESPIRATORY_TRACT
  Filled 2013-09-20: qty 0.5

## 2013-09-20 MED ORDER — VITAMIN D 1000 UNITS PO TABS
2000.0000 [IU] | ORAL_TABLET | Freq: Every morning | ORAL | Status: DC
  Administered 2013-09-21 – 2013-10-02 (×12): 2000 [IU] via ORAL
  Filled 2013-09-20 (×12): qty 2

## 2013-09-20 MED ORDER — ALIGN 4 MG PO CAPS: 1.0000 | ORAL_CAPSULE | Freq: Two times a day (BID) | ORAL | Status: DC

## 2013-09-20 MED ORDER — GABAPENTIN 300 MG PO CAPS
300.0000 mg | ORAL_CAPSULE | Freq: Every day | ORAL | Status: DC
  Administered 2013-09-20 – 2013-10-01 (×12): 300 mg via ORAL
  Filled 2013-09-20 (×14): qty 1

## 2013-09-20 MED ORDER — CAMPHOR-MENTHOL 0.5-0.5 % EX LOTN
1.0000 "application " | TOPICAL_LOTION | Freq: Every day | CUTANEOUS | Status: DC | PRN
  Administered 2013-09-20: 1 via TOPICAL
  Filled 2013-09-20: qty 222

## 2013-09-20 NOTE — ED Notes (Signed)
MD at bedside. 

## 2013-09-20 NOTE — H&P (Signed)
[facility] History and Physical  [name]:[id number] DOB: 08-02-1928    PCP:   [name], MD   Chief Complaint: Headaches.  HPI: [name] is an 79 y.o. male lives at home with his wife, with hx of HTN, arthritis, DDD, rheumatoid arthritis on chronic low dose steroids, CKD III, brought to the ER with pain in the occiput.  Wife said it was severe and he was screaming when he touches it.  No blurry vx, stiff neck, confusion, photophobia, or diffuse HA.  He also has been feeling weak as well.  Work up in the ER included a negative head CT, Cr of 2.1, and WBC of 12.2K.  He was given dilaudid with improvement of his pain, and was about to be discharged to home by EDP,  but family was not comfortable taking him home, as his wife would not be able to get pain meds for him, and was concerned with his pain coming back.  Hospitalist was asked to admit him for pain control.  Rewiew of Systems:  Constitutional: Negative for malaise, fever and chills. No significant weight loss or weight gain Eyes: Negative for eye pain, redness and discharge, diplopia, visual changes, or flashes of light. ENMT: Negative for ear pain, hoarseness, nasal congestion, sinus pressure and sore throat. No headaches; tinnitus, drooling, or problem swallowing. Cardiovascular: Negative for chest pain, palpitations, diaphoresis, dyspnea and peripheral edema. ; No orthopnea, PND Respiratory: Negative for cough, hemoptysis, wheezing and stridor. No pleuritic chestpain. Gastrointestinal: Negative for nausea, vomiting, diarrhea, constipation, abdominal pain, melena, blood in stool, hematemesis, jaundice and rectal bleeding.    Genitourinary: Negative for frequency, dysuria, incontinence,flank pain and hematuria; Musculoskeletal: Negative for back pain and neck pain. Negative for swelling and trauma.;  Skin: . Negative for pruritus, rash, abrasions, bruising and skin lesion.; ulcerations Neuro: Negative for  headache, lightheadedness and neck stiffness. Negative for weakness, altered level of consciousness , altered mental status, extremity weakness, burning feet, involuntary movement, seizure and syncope.  Psych: negative for anxiety, depression, insomnia, tearfulness, panic attacks, hallucinations, paranoia, suicidal or homicidal ideation    Past Medical History  Diagnosis Date  . Hypertension   . Arthritis   . Ex-smoker   . Rheumatoid arthritis(714.0)   . Pulmonary nodule   . DDD (degenerative disc disease)   . Complication of anesthesia     " I am difficult to wake up "  . Chronic kidney disease     acute 2013, 2014 on chronic stage 3.  . Anemia   . Proteus septicemia 02/2013  . Barrett's esophagus 04/2006    egd by dr Virginia Rochester.   . Colon polyp 04/2006    Hyperplastic on colonoscopy by Dr Virginia Rochester.   . Encephalopathy, metabolic 02/2013    Past Surgical History  Procedure Laterality Date  . Joint replacement      bilateral knee replacement  . Hernia repair    . Back surgery      X 2  . Appendectomy      Medications:  HOME MEDS: Prior to Admission medications   Medication Sig Start Date End Date Taking? Authorizing Provider  albuterol (PROVENTIL) (2.5 MG/3ML) 0.083% nebulizer solution Take 2.5 mg by nebulization 3 (three) times daily. For shortness of breath   Yes Historical Provider, MD  alendronate (FOSAMAX) 70 MG tablet Take 70 mg by mouth every 7 (seven) days. Take with a full glass of water on an empty stomach.  On Friday   Yes Historical Provider, MD  aspirin EC 81 MG tablet Take 81 mg by mouth every morning.    Yes Historical Provider, MD  camphor-menthol Wynelle Fanny) lotion Apply 1 application topically daily as needed for itching.   Yes Historical Provider, MD  Cholecalciferol (VITAMIN D) 2000 UNITS tablet Take 2,000 Units by mouth every morning.    Yes Historical Provider, MD  escitalopram (LEXAPRO) 10 MG tablet Take 10 mg by mouth daily.   Yes Historical Provider, MD  gabapentin  (NEURONTIN) 300 MG capsule Take 300 mg by mouth at bedtime.   Yes Historical Provider, MD  hydrocortisone cream 1 % Apply 1 application topically daily as needed for itching.   Yes Historical Provider, MD  loratadine (CLARITIN) 10 MG tablet Take 10 mg by mouth daily.   Yes Historical Provider, MD  metoprolol tartrate (LOPRESSOR) 25 MG tablet Take 25 mg by mouth 2 (two) times daily.   Yes Historical Provider, MD  Multiple Vitamin (MULTIVITAMIN WITH MINERALS) TABS tablet Take 1 tablet by mouth daily.   Yes Historical Provider, MD  omeprazole (PRILOSEC) 40 MG capsule Take 40 mg by mouth daily.   Yes Historical Provider, MD  predniSONE (DELTASONE) 5 MG tablet Take 5 mg by mouth daily.   Yes Historical Provider, MD  Probiotic Product (ALIGN) 4 MG CAPS Take 1 capsule by mouth 2 (two) times daily.    Yes Historical Provider, MD  Tamsulosin HCl (FLOMAX) 0.4 MG CAPS Take 1 capsule (0.4 mg total) by mouth daily after supper. 06/13/12  Yes Vassie Loll, MD  torsemide (DEMADEX) 20 MG tablet Take 20 mg by mouth daily.   Yes Historical Provider, MD  traMADol (ULTRAM) 50 MG tablet Take 50 mg by mouth every 6 (six) hours as needed for moderate pain.   Yes Historical Provider, MD  moxifloxacin (AVELOX) 400 MG tablet Take 400 mg by mouth daily at 8 pm. Started 12/10 Ended 12/20    Historical Provider, MD  trimethoprim (TRIMPEX) 100 MG tablet Take 100 mg by mouth daily.    Historical Provider, MD     Allergies:  Allergies  Allergen Reactions  . Codeine Other (See Comments)     drives me crazy  . Hydrocodone Other (See Comments)    Makes patient hallucinate, cannot sleep  . Oxycodone Other (See Comments)    Makes patient hallucinate, cannot sleep  . Penicillins Hives  . Sulfa Antibiotics Other (See Comments)    Per MAR    Social History:   reports that he quit smoking about 39 years ago. His smoking use included Cigarettes. He has a 15 pack-year smoking history. He has never used smokeless tobacco. He  reports that he does not drink alcohol or use illicit drugs.  Family History: Family History  Problem Relation Age of Onset  . Coronary artery disease Brother   . Hypertension Father   . Hypertension Mother      Physical Exam: Filed Vitals:   09/20/13 1543 09/20/13 1800 09/20/13 1815 09/20/13 1900  BP: 180/81 164/84 150/88 145/73  Pulse: 73 78 78 79  Temp: 97.5 F (36.4 C)     TempSrc: Oral     Resp: 18     SpO2: 98% 100% 98% 95%   Blood pressure 145/73, pulse 79, temperature 97.5 F (36.4 C), temperature source Oral, resp. rate 18, SpO2 95.00%.  GEN:  Pleasant patient lying in the stretcher in no acute distress; cooperative with exam. PSYCH:  alert and oriented x4; does not appear anxious or depressed; affect is appropriate. HEENT: Mucous membranes  pink and anicteric; PERRLA; EOM intact; no cervical lymphadenopathy nor thyromegaly or carotid bruit; no JVD; There were no stridor. Neck is very supple. Breasts:: Not examined CHEST WALL: No tenderness CHEST: Normal respiration, clear to auscultation bilaterally.  HEART: Regular rate and rhythm.  There are no murmur, rub, or gallops.   BACK: No kyphosis or scoliosis; no CVA tenderness ABDOMEN: soft and non-tender; no masses, no organomegaly, normal abdominal bowel sounds; no pannus; no intertriginous candida. There is no rebound and no distention. Rectal Exam: Not done EXTREMITIES: No bone or joint deformity; age-appropriate arthropathy of the hands and knees; no edema; no ulcerations.  There is no calf tenderness. Genitalia: not examined PULSES: 2+ and symmetric SKIN: Normal hydration no rash or ulceration.  Palpable pain at the occiput. CNS: Cranial nerves 2-12 grossly intact no focal lateralizing neurologic deficit.  Speech is fluent; uvula elevated with phonation, facial symmetry and tongue midline. DTR are normal bilaterally, cerebella exam is intact, barbinski is negative and strengths are equaled bilaterally.  No sensory  loss.   Labs on Admission:  Basic Metabolic Panel:  Recent Labs Lab 09/20/13 1637  NA 145  K 5.1  CL 103  CO2 37*  GLUCOSE 102*  BUN 36*  CREATININE 2.14*  CALCIUM 9.5   Liver Function Tests:  Recent Labs Lab 09/20/13 1637  AST 14  ALT 22  ALKPHOS 75  BILITOT 0.5  PROT 5.9*  ALBUMIN 2.9*   No results found for this basename: LIPASE, AMYLASE,  in the last 168 hours No results found for this basename: AMMONIA,  in the last 168 hours CBC:  Recent Labs Lab 09/20/13 1637  WBC 12.2*  NEUTROABS 9.8*  HGB 10.3*  HCT 34.2*  MCV 95.0  PLT 127*   Cardiac Enzymes: No results found for this basename: CKTOTAL, CKMB, CKMBINDEX, TROPONINI,  in the last 168 hours  CBG: No results found for this basename: GLUCAP,  in the last 168 hours   Radiological Exams on Admission: Dg Chest 2 View  09/20/2013   CLINICAL DATA:  Hypertension, COPD  EXAM: CHEST  2 VIEW  COMPARISON:  08/17/2013  FINDINGS: Chronic right hemidiaphragm elevation. Heart is enlarged with vascular and interstitial prominence. Difficult to exclude mild edema. Bibasilar atelectasis present. No definite focal pneumonia. No large effusion or pneumothorax. Trachea is midline.  IMPRESSION: Cardiomegaly with vascular congestion versus early edema  Basilar atelectasis  Chronic right hemidiaphragm elevation   Electronically Signed   By: Ruel Favors M.D.   On: 09/20/2013 17:46   Ct Head Wo Contrast  09/20/2013   CLINICAL DATA:  Pain.  EXAM: CT HEAD WITHOUT CONTRAST  TECHNIQUE: Contiguous axial images were obtained from the base of the skull through the vertex without intravenous contrast.  COMPARISON:  02/27/2013.  FINDINGS: No mass. No hydrocephalus. No hemorrhage. No acute bony abnormality. Visualized paranasal sinuses are clear. Small amount of fluid noted in the left mastoid air cells.  IMPRESSION: 1. Findings suggesting chronic white matter ischemia. Head CT is stable from prior exam. 2. Minimal amount of fluid in the  left mastoid air cells.   Electronically Signed   By: Maisie Fus  Register   On: 09/20/2013 17:09   Ct Cervical Spine Wo Contrast  09/20/2013   CLINICAL DATA:  Right posterior head pain, epistaxis, rash  EXAM: CT CERVICAL SPINE WITHOUT CONTRAST  TECHNIQUE: Multidetector CT imaging of the cervical spine was performed without intravenous contrast. Multiplanar CT image reconstructions were also generated. Accompanying CT images of the head of  the previously interpreted, please refer to that report.  COMPARISON:  None; correlation made with PET-CT 04/29/2009  FINDINGS: Small amount of fluid within left mastoid air cells.  Bones appear diffusely demineralized.  Prevertebral soft tissues normal thickness.  Skullbase intact.  Marked disc space narrowing with endplate spur formation at C5-C6.  Additional disc space narrowing and spurring identified at C6-C7 and T2-T3.  Minimal retrolisthesis at C5-C6 with 3.7 mm of anterolisthesis at C6-C7.  Minimal superior endplate compression fracture at T3.  Remaining vertebral demonstrate normal height without additional fracture or subluxation.  Multilevel facet degenerative changes.  Question bone islands in C6 vertebral body and spinous process of C4.  Prior median sternotomy.  Scattered carotid atherosclerotic calcifications.  Small nodular foci at the lung apices uncertain etiology.  IMPRESSION: Multilevel degenerative disc and facet disease changes of the cervical spine as above.  No definite acute cervical spine abnormalities.  Fluid within left mastoid air cells.  Numerous nodular foci at the lung apices, some present since 2010 but suspect increased in number, recommend followup CT imaging of the chest for further evaluation.   Electronically Signed   By: Ulyses Southward M.D.   On: 09/20/2013 17:19   Assessment/Plan Present on Admission:  . Headache . COPD (chronic obstructive pulmonary disease) . HYPERTENSION . CKD (chronic kidney disease) stage 3, GFR 30-59 ml/min .  Physical deconditioning . HA (headache)   PLAN: His HA seems benign, and appears to be extra-cranial in etiology.  His wife is persistent in her inability to take care of him if the HA returns, and he was a little weak.  Will admit him for pain control tonight.  I have ordered PT/OT for him to evaluate his gait.  I have continued his home meds, and if he does well tonight, he should be able to go home tomorrow.  He is stable, full code, and will be admitted to [facility] service.  Thank you for asking me to pariticpate in his care.  Other plans as per orders.  Code Status: full code.   Houston Siren, MD. [facility] Pager [phone] 7pm to 7am.  09/20/2013, 9:31 PM

## 2013-09-20 NOTE — ED Notes (Signed)
Pt in ct 

## 2013-09-20 NOTE — ED Notes (Signed)
Multiple complaints. Having pain to right posterior head, recent nosebleeds and has painful red rash to right side of back. No acute distress noted at triage.

## 2013-09-20 NOTE — ED Provider Notes (Addendum)
CSN: [id number]     Arrival date & time 09/20/13  1352 History   First MD Initiated Contact with Patient 09/20/13 1532     Chief Complaint  Patient presents with  . Rash  . Pain   (Consider location/radiation/quality/duration/timing/severity/associated sxs/prior Treatment) Patient is a 79 y.o. male presenting with rash. The history is provided by the patient and a relative.  Rash Associated symptoms: fatigue, headaches and shortness of breath   Associated symptoms: no abdominal pain, no fever, no nausea and not vomiting    patient brought in now with main complaint being posterior headache and present all day not helped by his tramadol. Other complaints included the chest discomfort with cough only has been coughing more frequently recently. Patient has a history of COPD he also made mention of recent nosebleeds but none recently. Pain is some pain in the back of the neck that may be the source of the headache. Patient was in no distress in triage. Patient primary doctor Dr. Ricki Miller.   Past Medical History  Diagnosis Date  . Hypertension   . Arthritis   . Ex-smoker   . Rheumatoid arthritis(714.0)   . Pulmonary nodule   . DDD (degenerative disc disease)   . Complication of anesthesia     " I am difficult to wake up "  . Chronic kidney disease     acute 2013, 2014 on chronic stage 3.  . Anemia   . Proteus septicemia 02/2013  . Barrett's esophagus 04/2006    egd by dr Virginia Rochester.   . Colon polyp 04/2006    Hyperplastic on colonoscopy by Dr Virginia Rochester.   . Encephalopathy, metabolic 02/2013   Past Surgical History  Procedure Laterality Date  . Joint replacement      bilateral knee replacement  . Hernia repair    . Back surgery      X 2  . Appendectomy     Family History  Problem Relation Age of Onset  . Coronary artery disease Brother   . Hypertension Father   . Hypertension Mother    History  Substance Use Topics  . Smoking status: Former Smoker -- 0.50 packs/day for 30 years    Types:  Cigarettes    Quit date: 10/12/1973  . Smokeless tobacco: Never Used  . Alcohol Use: No    Review of Systems  Constitutional: Positive for fatigue. Negative for fever.  HENT: Positive for nosebleeds. Negative for congestion.   Eyes: Negative for redness.  Respiratory: Positive for shortness of breath.   Cardiovascular: Positive for chest pain.  Gastrointestinal: Negative for nausea, vomiting and abdominal pain.  Genitourinary: Negative for dysuria.  Musculoskeletal: Positive for neck pain. Negative for back pain.  Skin: Positive for rash.  Neurological: Positive for headaches.  Psychiatric/Behavioral: Negative for confusion.    Allergies  Codeine; Hydrocodone; Oxycodone; Penicillins; and Sulfa antibiotics  Home Medications   Current Outpatient Rx  Name  Route  Sig  Dispense  Refill  . albuterol (PROVENTIL) (2.5 MG/3ML) 0.083% nebulizer solution   Nebulization   Take 2.5 mg by nebulization 3 (three) times daily. For shortness of breath         . alendronate (FOSAMAX) 70 MG tablet   Oral   Take 70 mg by mouth every 7 (seven) days. Take with a full glass of water on an empty stomach.  On Friday         . aspirin EC 81 MG tablet   Oral   Take 81 mg by mouth  every morning.          . camphor-menthol (SARNA) lotion   Topical   Apply 1 application topically daily as needed for itching.         . Cholecalciferol (VITAMIN D) 2000 UNITS tablet   Oral   Take 2,000 Units by mouth every morning.          . escitalopram (LEXAPRO) 10 MG tablet   Oral   Take 10 mg by mouth daily.         Marland Kitchen gabapentin (NEURONTIN) 300 MG capsule   Oral   Take 300 mg by mouth at bedtime.         . hydrocortisone cream 1 %   Topical   Apply 1 application topically daily as needed for itching.         . loratadine (CLARITIN) 10 MG tablet   Oral   Take 10 mg by mouth daily.         . metoprolol tartrate (LOPRESSOR) 25 MG tablet   Oral   Take 25 mg by mouth 2 (two) times  daily.         . Multiple Vitamin (MULTIVITAMIN WITH MINERALS) TABS tablet   Oral   Take 1 tablet by mouth daily.         Marland Kitchen omeprazole (PRILOSEC) 40 MG capsule   Oral   Take 40 mg by mouth daily.         . predniSONE (DELTASONE) 5 MG tablet   Oral   Take 5 mg by mouth daily.         . Probiotic Product (ALIGN) 4 MG CAPS   Oral   Take 1 capsule by mouth 2 (two) times daily.          . Tamsulosin HCl (FLOMAX) 0.4 MG CAPS   Oral   Take 1 capsule (0.4 mg total) by mouth daily after supper.         . torsemide (DEMADEX) 20 MG tablet   Oral   Take 20 mg by mouth daily.         . traMADol (ULTRAM) 50 MG tablet   Oral   Take 50 mg by mouth every 6 (six) hours as needed for moderate pain.         Marland Kitchen moxifloxacin (AVELOX) 400 MG tablet   Oral   Take 400 mg by mouth daily at 8 pm. Started 12/10 Ended 12/20         . trimethoprim (TRIMPEX) 100 MG tablet   Oral   Take 100 mg by mouth daily.          BP 145/73  Pulse 79  Temp(Src) 97.5 F (36.4 C) (Oral)  Resp 18  SpO2 95% Physical Exam  Nursing note and vitals reviewed. Constitutional: He is oriented to person, place, and time. He appears well-developed and well-nourished. No distress.  HENT:  Head: Normocephalic and atraumatic.  Mouth/Throat: Oropharynx is clear and moist.  Eyes: Conjunctivae and EOM are normal. Pupils are equal, round, and reactive to light.  Neck: Normal range of motion.  Cardiovascular: Normal rate, regular rhythm and normal heart sounds.   Pulmonary/Chest: Effort normal and breath sounds normal. No respiratory distress.  Abdominal: Soft. Bowel sounds are normal. There is no tenderness.  Musculoskeletal: Normal range of motion.  Neurological: He is alert and oriented to person, place, and time. No cranial nerve deficit. He exhibits normal muscle tone. Coordination normal.  Except hard of hearing  Skin: Skin is  warm and dry. Rash noted.  Patient's left arm with an erythematous  somewhat scaly rash also involves left side of face and left side of abdomen not consistent with herpes zoster. This has been present for several weeks.    ED Course  Procedures (including critical care time) Labs Review Labs Reviewed  PRO B NATRIURETIC PEPTIDE - Abnormal; Notable for the following:    Pro B Natriuretic peptide (BNP) 1238.0 (*)    All other components within normal limits  CBC WITH DIFFERENTIAL - Abnormal; Notable for the following:    WBC 12.2 (*)    RBC 3.60 (*)    Hemoglobin 10.3 (*)    HCT 34.2 (*)    RDW 19.5 (*)    Platelets 127 (*)    Neutrophils Relative % 80 (*)    Neutro Abs 9.8 (*)    Lymphocytes Relative 10 (*)    Monocytes Absolute 1.1 (*)    All other components within normal limits  COMPREHENSIVE METABOLIC PANEL - Abnormal; Notable for the following:    CO2 37 (*)    Glucose, Bld 102 (*)    BUN 36 (*)    Creatinine, Ser 2.14 (*)    Total Protein 5.9 (*)    Albumin 2.9 (*)    GFR calc non Af Amer 27 (*)    GFR calc Af Amer 31 (*)    All other components within normal limits  PROTIME-INR   Results for orders placed during the hospital encounter of 09/20/13  PROTIME-INR      Result Value Range   Prothrombin Time 12.8  11.6 - 15.2 seconds   INR 0.98  0.00 - 1.49  PRO B NATRIURETIC PEPTIDE      Result Value Range   Pro B Natriuretic peptide (BNP) 1238.0 (*) 0 - 450 pg/mL  CBC WITH DIFFERENTIAL      Result Value Range   WBC 12.2 (*) 4.0 - 10.5 K/uL   RBC 3.60 (*) 4.22 - 5.81 MIL/uL   Hemoglobin 10.3 (*) 13.0 - 17.0 g/dL   HCT 78.2 (*) 95.6 - 21.3 %   MCV 95.0  78.0 - 100.0 fL   MCH 28.6  26.0 - 34.0 pg   MCHC 30.1  30.0 - 36.0 g/dL   RDW 08.6 (*) 57.8 - 46.9 %   Platelets 127 (*) 150 - 400 K/uL   Neutrophils Relative % 80 (*) 43 - 77 %   Neutro Abs 9.8 (*) 1.7 - 7.7 K/uL   Lymphocytes Relative 10 (*) 12 - 46 %   Lymphs Abs 1.2  0.7 - 4.0 K/uL   Monocytes Relative 9  3 - 12 %   Monocytes Absolute 1.1 (*) 0.1 - 1.0 K/uL   Eosinophils  Relative 1  0 - 5 %   Eosinophils Absolute 0.1  0.0 - 0.7 K/uL   Basophils Relative 0  0 - 1 %   Basophils Absolute 0.0  0.0 - 0.1 K/uL  COMPREHENSIVE METABOLIC PANEL      Result Value Range   Sodium 145  135 - 145 mEq/L   Potassium 5.1  3.5 - 5.1 mEq/L   Chloride 103  96 - 112 mEq/L   CO2 37 (*) 19 - 32 mEq/L   Glucose, Bld 102 (*) 70 - 99 mg/dL   BUN 36 (*) 6 - 23 mg/dL   Creatinine, Ser 6.29 (*) 0.50 - 1.35 mg/dL   Calcium 9.5  8.4 - 52.8 mg/dL   Total Protein 5.9 (*)  6.0 - 8.3 g/dL   Albumin 2.9 (*) 3.5 - 5.2 g/dL   AST 14  0 - 37 U/L   ALT 22  0 - 53 U/L   Alkaline Phosphatase 75  39 - 117 U/L   Total Bilirubin 0.5  0.3 - 1.2 mg/dL   GFR calc non Af Amer 27 (*) >90 mL/min   GFR calc Af Amer 31 (*) >90 mL/min    Imaging Review Dg Chest 2 View  09/20/2013   CLINICAL DATA:  Hypertension, COPD  EXAM: CHEST  2 VIEW  COMPARISON:  08/17/2013  FINDINGS: Chronic right hemidiaphragm elevation. Heart is enlarged with vascular and interstitial prominence. Difficult to exclude mild edema. Bibasilar atelectasis present. No definite focal pneumonia. No large effusion or pneumothorax. Trachea is midline.  IMPRESSION: Cardiomegaly with vascular congestion versus early edema  Basilar atelectasis  Chronic right hemidiaphragm elevation   Electronically Signed   By: Ruel Favors M.D.   On: 09/20/2013 17:46   Ct Head Wo Contrast  09/20/2013   CLINICAL DATA:  Pain.  EXAM: CT HEAD WITHOUT CONTRAST  TECHNIQUE: Contiguous axial images were obtained from the base of the skull through the vertex without intravenous contrast.  COMPARISON:  02/27/2013.  FINDINGS: No mass. No hydrocephalus. No hemorrhage. No acute bony abnormality. Visualized paranasal sinuses are clear. Small amount of fluid noted in the left mastoid air cells.  IMPRESSION: 1. Findings suggesting chronic white matter ischemia. Head CT is stable from prior exam. 2. Minimal amount of fluid in the left mastoid air cells.   Electronically Signed    By: Maisie Fus  Register   On: 09/20/2013 17:09   Ct Cervical Spine Wo Contrast  09/20/2013   CLINICAL DATA:  Right posterior head pain, epistaxis, rash  EXAM: CT CERVICAL SPINE WITHOUT CONTRAST  TECHNIQUE: Multidetector CT imaging of the cervical spine was performed without intravenous contrast. Multiplanar CT image reconstructions were also generated. Accompanying CT images of the head of the previously interpreted, please refer to that report.  COMPARISON:  None; correlation made with PET-CT 04/29/2009  FINDINGS: Small amount of fluid within left mastoid air cells.  Bones appear diffusely demineralized.  Prevertebral soft tissues normal thickness.  Skullbase intact.  Marked disc space narrowing with endplate spur formation at C5-C6.  Additional disc space narrowing and spurring identified at C6-C7 and T2-T3.  Minimal retrolisthesis at C5-C6 with 3.7 mm of anterolisthesis at C6-C7.  Minimal superior endplate compression fracture at T3.  Remaining vertebral demonstrate normal height without additional fracture or subluxation.  Multilevel facet degenerative changes.  Question bone islands in C6 vertebral body and spinous process of C4.  Prior median sternotomy.  Scattered carotid atherosclerotic calcifications.  Small nodular foci at the lung apices uncertain etiology.  IMPRESSION: Multilevel degenerative disc and facet disease changes of the cervical spine as above.  No definite acute cervical spine abnormalities.  Fluid within left mastoid air cells.  Numerous nodular foci at the lung apices, some present since 2010 but suspect increased in number, recommend followup CT imaging of the chest for further evaluation.   Electronically Signed   By: Ulyses Southward M.D.   On: 09/20/2013 17:19    EKG Interpretation   None       MDM   1. Headache    Initially patient's main concern was the complaint of a posterior headache. Workup for that without any significant findings also did a general workup because he  talked about some intermittent chest discomfort with cough no prolonged chest  pain. Chest x-ray negative for pneumonia prolapse some early pulmonary edema. Patient known to have bad COPD also known to have hypertension chronic kidney insufficiency. Not known to have CHF.  Workup for that headache with no significant findings patient's headache improved significantly with 1 mg of hydromorphone. Now family members saying that she can't take him home not able to handle him at home that he sat too goofy still is a headache they can get pain medicine today or tomorrow because of everything being closed. She is unable to deal with him at home and once admitted.    We'll discuss with the hospitalist team about an overnight admission.  In addition patient occasionally did have saturations that went below 90% on his normal 2 L in room could make an argument for some transient hypoxia however did not hear any wheezing.  In addition the patient has a skin rash his primary care Dr. is aware of and was treating with prednisone still currently taking a baseline of prednisone 5 mg a day has been tapered down from a higher dose Dr. I. the dose was. Cause of the rash is unknown to his primary care doctor as well.  We will to arch show the hypoxia to make sure doesn't get worse that could be developing some mild pulmonary edema as well we care troponin is pending. He will be admitted by the hospitalist team.    Shelda Jakes, MD 09/20/13 2051  Shelda Jakes, MD 09/20/13 1610  Shelda Jakes, MD 09/20/13 2213

## 2013-09-20 NOTE — Progress Notes (Signed)

## 2013-09-20 NOTE — ED Notes (Signed)
Pt in xray

## 2013-09-21 DIAGNOSIS — I509 Heart failure, unspecified: Secondary | ICD-10-CM

## 2013-09-21 DIAGNOSIS — N179 Acute kidney failure, unspecified: Secondary | ICD-10-CM

## 2013-09-21 LAB — SEDIMENTATION RATE: Sed Rate: 30 mm/hr — ABNORMAL HIGH (ref 0–16)

## 2013-09-21 MED ORDER — HYDRALAZINE HCL 20 MG/ML IJ SOLN
10.0000 mg | Freq: Four times a day (QID) | INTRAMUSCULAR | Status: DC | PRN
  Administered 2013-09-21 – 2013-09-22 (×2): 10 mg via INTRAVENOUS
  Filled 2013-09-21 (×2): qty 1

## 2013-09-21 MED ORDER — FUROSEMIDE 10 MG/ML IJ SOLN
40.0000 mg | Freq: Once | INTRAMUSCULAR | Status: AC
  Administered 2013-09-21: 40 mg via INTRAVENOUS
  Filled 2013-09-21: qty 4

## 2013-09-21 MED ORDER — FUROSEMIDE 10 MG/ML IJ SOLN
40.0000 mg | Freq: Two times a day (BID) | INTRAMUSCULAR | Status: DC
  Administered 2013-09-21 – 2013-09-23 (×4): 40 mg via INTRAVENOUS
  Filled 2013-09-21 (×7): qty 4

## 2013-09-21 MED ORDER — GUAIFENESIN-DM 100-10 MG/5ML PO SYRP
5.0000 mL | ORAL_SOLUTION | ORAL | Status: DC | PRN
Start: 2013-09-21 — End: 2013-10-02
  Administered 2013-09-21 – 2013-09-22 (×3): 5 mL via ORAL
  Filled 2013-09-21 (×3): qty 5

## 2013-09-21 NOTE — Progress Notes (Signed)
[facility] PROGRESS NOTE  [name]:[id number] DOB: 02-08-1928 DOA: 09/20/2013 PCP: [name], MD   79 y.o. Male with hx of HTN, arthritis, DDD, rheumatoid arthritis on chronic low dose steroids, CKD III, brought to the ER with pain in the right occiput. Has tenderness to a certain area. No blurry vx, stiff neck, confusion, photophobia, or diffuse HA. He also has been feeling weak as well. Work up in the ER included a negative head CT, Cr of 2.1, and WBC of 12.2K. He was given dilaudid with improvement of his pain, and was about to be discharged to home by EDP, but family was not comfortable taking him home, as his wife would not be able to get pain meds for him, and was concerned with his pain coming back. Hospitalist was asked to admit him for pain control  Assessment/Plan: Headache Appears non specific. Head CT negative. Check ESR. Continue  tylenol. Prn dilaudid for pain  Shortness of breath  Patient appears congested on exam. CXR showing mild pulm edema. Pt has diastolic CHF and COPD. placed in IV lasix bid and scheduled nebs. continue o2 vis [place] Monitor I/O  Recent echo normal  CKD stage 3 Baseline creatinine of 1.6-2. slightly worsened . Would need monitoring while on IV lasix   Chronic bilateral hydronephrosis  Patient has recurrent UTIs. continue flomax . Follows with urologist as outpt  Deconditioning Was discharged to [facility] hospitalization. Appears very frail and weak. Will get PT eval  Rheumatoid arthritis Continue home dose prednisone  Code Status: full Family Communication: daughter at bedside Disposition Plan: pending PT eval    Consultants:  none  Procedures:  none  Antibiotics: none HPI/Subjective: Patient noted to be congested on exam. Still reports some headache  Objective: Filed Vitals:   09/21/13 1337  BP: 177/81  Pulse: 77  Temp: 98.2 F (36.8 C)  Resp: 20    Intake/Output Summary (Last 24 hours) at 09/21/13  1743 Last data filed at 09/21/13 1100  Gross per 24 hour  Intake    240 ml  Output    500 ml  Net   -260 ml   Filed Weights   09/20/13 2300  Weight: 70.2 kg (154 lb 12.2 oz)    Exam:   General: Elderly frail male lying in bed in no acute distress  HEENT: No pallor, moist oral mucosa, no JVD, tender to palpation over her right occiput, no swelling or deformity  Chest: Coarse breath sounds bilaterally, basal crackles,  CVS: Normal S1 and S2, no murmurs rub or gallop  Abdomen: Soft, nontender, nondistended, bowel sounds present  Extremities: Warm, no edema  CNS: AAO x3      Data Reviewed: Basic Metabolic Panel:  Recent Labs Lab 09/20/13 1637  NA 145  K 5.1  CL 103  CO2 37*  GLUCOSE 102*  BUN 36*  CREATININE 2.14*  CALCIUM 9.5   Liver Function Tests:  Recent Labs Lab 09/20/13 1637  AST 14  ALT 22  ALKPHOS 75  BILITOT 0.5  PROT 5.9*  ALBUMIN 2.9*   No results found for this basename: LIPASE, AMYLASE,  in the last 168 hours No results found for this basename: AMMONIA,  in the last 168 hours CBC:  Recent Labs Lab 09/20/13 1637  WBC 12.2*  NEUTROABS 9.8*  HGB 10.3*  HCT 34.2*  MCV 95.0  PLT 127*   Cardiac Enzymes: No results found for this basename: CKTOTAL, CKMB, CKMBINDEX, TROPONINI,  in the last 168 hours BNP (last  3 results)  Recent Labs  08/02/13 1740 08/18/13 0625 09/20/13 1637  PROBNP 7170.0* 1018.0* 1238.0*   CBG: No results found for this basename: GLUCAP,  in the last 168 hours  No results found for this or any previous visit (from the past 240 hour(s)).   Studies: Dg Chest 2 View  09/20/2013   CLINICAL DATA:  Hypertension, COPD  EXAM: CHEST  2 VIEW  COMPARISON:  08/17/2013  FINDINGS: Chronic right hemidiaphragm elevation. Heart is enlarged with vascular and interstitial prominence. Difficult to exclude mild edema. Bibasilar atelectasis present. No definite focal pneumonia. No large effusion or pneumothorax. Trachea is  midline.  IMPRESSION: Cardiomegaly with vascular congestion versus early edema  Basilar atelectasis  Chronic right hemidiaphragm elevation   Electronically Signed   By: Ruel Favors M.D.   On: 09/20/2013 17:46   Ct Head Wo Contrast  09/20/2013   CLINICAL DATA:  Pain.  EXAM: CT HEAD WITHOUT CONTRAST  TECHNIQUE: Contiguous axial images were obtained from the base of the skull through the vertex without intravenous contrast.  COMPARISON:  02/27/2013.  FINDINGS: No mass. No hydrocephalus. No hemorrhage. No acute bony abnormality. Visualized paranasal sinuses are clear. Small amount of fluid noted in the left mastoid air cells.  IMPRESSION: 1. Findings suggesting chronic white matter ischemia. Head CT is stable from prior exam. 2. Minimal amount of fluid in the left mastoid air cells.   Electronically Signed   By: Maisie Fus  Register   On: 09/20/2013 17:09   Ct Cervical Spine Wo Contrast  09/20/2013   CLINICAL DATA:  Right posterior head pain, epistaxis, rash  EXAM: CT CERVICAL SPINE WITHOUT CONTRAST  TECHNIQUE: Multidetector CT imaging of the cervical spine was performed without intravenous contrast. Multiplanar CT image reconstructions were also generated. Accompanying CT images of the head of the previously interpreted, please refer to that report.  COMPARISON:  None; correlation made with PET-CT 04/29/2009  FINDINGS: Small amount of fluid within left mastoid air cells.  Bones appear diffusely demineralized.  Prevertebral soft tissues normal thickness.  Skullbase intact.  Marked disc space narrowing with endplate spur formation at C5-C6.  Additional disc space narrowing and spurring identified at C6-C7 and T2-T3.  Minimal retrolisthesis at C5-C6 with 3.7 mm of anterolisthesis at C6-C7.  Minimal superior endplate compression fracture at T3.  Remaining vertebral demonstrate normal height without additional fracture or subluxation.  Multilevel facet degenerative changes.  Question bone islands in C6 vertebral body  and spinous process of C4.  Prior median sternotomy.  Scattered carotid atherosclerotic calcifications.  Small nodular foci at the lung apices uncertain etiology.  IMPRESSION: Multilevel degenerative disc and facet disease changes of the cervical spine as above.  No definite acute cervical spine abnormalities.  Fluid within left mastoid air cells.  Numerous nodular foci at the lung apices, some present since 2010 but suspect increased in number, recommend followup CT imaging of the chest for further evaluation.   Electronically Signed   By: Ulyses Southward M.D.   On: 09/20/2013 17:19    Scheduled Meds: . acidophilus  1 capsule Oral BID  . aspirin EC  81 mg Oral q morning - 10a  . cholecalciferol  2,000 Units Oral q morning - 10a  . docusate sodium  100 mg Oral BID  . escitalopram  10 mg Oral Daily  . furosemide  40 mg Intravenous Q12H  . gabapentin  300 mg Oral QHS  . heparin  5,000 Units Subcutaneous Q8H  . loratadine  10 mg Oral Daily  .  metoprolol tartrate  25 mg Oral BID  . pantoprazole  40 mg Oral Daily  . predniSONE  20 mg Oral BID WC  . tamsulosin  0.4 mg Oral QPC supper   Continuous Infusions:   Time spent: 25 minutes    Chessie Neuharth  [facility] Pager [phone] If 7PM-7AM, please contact night-coverage at [email or web address], password [email or web address] 09/21/2013, 5:43 PM  LOS: 1 day

## 2013-09-21 NOTE — Progress Notes (Signed)
Pt very congested weak cough and c/o  Sob and pain to back of neck .  Oxygen 2 ln/c started, respiratory therapist notify for treatment and MD made  Aware of patients status.  New orders given  Therapist at bedside  given treatment  Rn will continue to monitor pt.[name]

## 2013-09-22 DIAGNOSIS — J9600 Acute respiratory failure, unspecified whether with hypoxia or hypercapnia: Secondary | ICD-10-CM

## 2013-09-22 LAB — BASIC METABOLIC PANEL
BUN: 46 mg/dL — ABNORMAL HIGH (ref 6–23)
Calcium: 9.6 mg/dL (ref 8.4–10.5)
Creatinine, Ser: 2.46 mg/dL — ABNORMAL HIGH (ref 0.50–1.35)
GFR calc non Af Amer: 23 mL/min — ABNORMAL LOW (ref >90)
Glucose, Bld: 112 mg/dL — ABNORMAL HIGH (ref 70–99)
Sodium: 144 mEq/L (ref 135–145)

## 2013-09-22 NOTE — Evaluation (Signed)
Physical Therapy Evaluation Patient Details Name: [name] MRN: [id number] DOB: October 16, 1927 Today's Date: 09/22/2013 Time: 1191-4782 PT Time Calculation (min): 33 min  PT Assessment / Plan / Recommendation History of Present Illness  79 y.o. male with PMHx of COPD, recurrent UTIs and recent treatment for cellulitis who had called the EMS reportedly for shortness of breath. However it is of note the patient denies ever having shortness of breath. His main complaint was of not feeling well overall with some pain in his lower extremities  Clinical Impression  Pt very lethargic and weak with significant congestion.  Not moving very well and may need SNF if doesn't progress soon.    PT Assessment  Patient needs continued PT services    Follow Up Recommendations  SNF;Other (comment) (hopefull pt perks up without need for SNF, I'm not confident)    Does the patient have the potential to tolerate intense rehabilitation      Barriers to Discharge        Equipment Recommendations  None recommended by PT    Recommendations for Other Services     Frequency Min 3X/week    Precautions / Restrictions     Pertinent Vitals/Pain 85% on RA during/after ambulation      Mobility  Bed Mobility Bed Mobility: Supine to Sit;Sitting - Scoot to Edge of Bed Supine to Sit: 4: Min assist Sitting - Scoot to [place] of Bed: 4: Min guard Details for Bed Mobility Assistance: slow and painful movement Transfers Transfers: Sit to Stand;Stand to Sit Sit to Stand: 4: Min assist;From elevated surface;With upper extremity assist;From bed;From toilet Stand to Sit: 4: Min assist;With upper extremity assist;To chair/3-in-1;To toilet Details for Transfer Assistance: cues for hand placement Ambulation/Gait Ambulation/Gait Assistance: 4: Min assist Ambulation Distance (Feet): 15 Feet (times 2) Assistive device: Rolling walker Ambulation/Gait Assistance Details: unsteady/ weak gait with stooped  posture Stairs: No Wheelchair Mobility Wheelchair Mobility: No    Exercises     PT Diagnosis: Difficulty walking;Generalized weakness;Acute pain  PT Problem List: Decreased strength;Decreased activity tolerance;Decreased mobility;Decreased knowledge of use of DME;Decreased knowledge of precautions;Cardiopulmonary status limiting activity;Pain PT Treatment Interventions: DME instruction;Gait training;Functional mobility training;Therapeutic activities;Balance training;Patient/family education     PT Goals(Current goals can be found in the care plan section) Acute Rehab PT Goals Patient Stated Goal: pt did not state. PT Goal Formulation: With patient Time For Goal Achievement: 10/06/13 Potential to Achieve Goals: Good  Visit Information  Last PT Received On: 09/22/13 Assistance Needed: +1 History of Present Illness: 79 y.o. male with PMHx of COPD, recurrent UTIs and recent treatment for cellulitis who had called the EMS reportedly for shortness of breath. However it is of note the patient denies ever having shortness of breath. His main complaint was of not feeling well overall with some pain in his lower extremities       Prior Functioning  Home Living Family/patient expects to be discharged to:: Private residence Living Arrangements: Spouse/significant other;Other (Comment) (daughter) Available Help at Discharge: Family;Other (Comment) (may need [facility] SNF if doesn't get moving well.) Type of Home: House Home Access: Ramped entrance Home Layout: One level Home Equipment: Walker - 2 wheels Additional Comments: per pt he was not on home oxygen (per daughter needed oxygen at times) Prior Function Level of Independence: Needs assistance Gait / Transfers Assistance Needed: pt able to amb with RW throughout house without (A) from daughter  (but more and more family is following him) ADL's / Homemaking Assistance Needed: per pt he takes  sponge baths and can clean himself but requires  setup (A); daughter is responsible for homemaking   Communication Communication: HOH    Cognition  Cognition Arousal/Alertness: Awake/alert Behavior During Therapy: Flat affect Overall Cognitive Status: Difficult to assess    Extremity/Trunk Assessment Upper Extremity Assessment Upper Extremity Assessment: Defer to OT evaluation Lower Extremity Assessment Lower Extremity Assessment: Generalized weakness;RLE deficits/detail;LLE deficits/detail RLE Deficits / Details: weak and painful 3- hip flexors o/w at 3/5 LLE Deficits / Details: weak and painful 3- to 3/5 generally   Balance Balance Balance Assessed: Yes Static Sitting Balance Static Sitting - Balance Support: Left upper extremity supported;Right upper extremity supported Static Sitting - Level of Assistance: 5: Stand by assistance  End of Session PT - End of Session Equipment Utilized During Treatment: Gait belt Activity Tolerance: Patient limited by fatigue;Patient limited by pain Patient left: in chair;with family/visitor present Nurse Communication: Mobility status  GP     Heberto Sturdevant, Eliseo Gum 09/22/2013, 3:53 PM 09/22/2013  [place] Bing, PT [phone]  (pager)

## 2013-09-22 NOTE — Progress Notes (Signed)
[facility] PROGRESS NOTE  [name] UUV:[id number] DOB: 06-Feb-1928 DOA: 09/20/2013 PCP: [name], MD Brief narrative 79 y.o. Male with hx of HTN, arthritis, DDD, rheumatoid arthritis on chronic low dose steroids, CKD III, brought to the ER with pain in the right occiput. Has tenderness to a certain area. No blurry vx, stiff neck, confusion, photophobia, or diffuse HA. He also has been feeling weak as well. Work up in the ER included a negative head CT, Cr of 2.1, and WBC of 12.2K. He was given dilaudid with improvement of his pain, and was about to be discharged to home by EDP, but family was not comfortable taking him home, as his wife would not be able to get pain meds for him, and was concerned with his pain coming back. Hospitalist was asked to admit him for pain control .  Assessment/Plan:  Headache  Appears non specific. Head CT negative. ESR mildly elevated.. Continue tylenol. Prn dilaudid for pain . Symptoms not resolved and appears to be related to muscle sprain/ thousand.  Shortness of breath  Patient appears congested on exam. CXR showing mild pulm edema. Pt has diastolic CHF and COPD. placed in IV lasix bid and scheduled nebs. continue o2 vis [place]  Monitor I/O  Recent echo normal  -symptoms Improving today.  CKD stage 3  Baseline creatinine of 1.6-2. Worsened in a.m. labs on Lasix. We'll monitor for now and reduce Lasix dose if continues to improve  Chronic bilateral hydronephrosis  Patient has recurrent UTIs. continue flomax . Follows with urologist as outpt   Deconditioning and severe malnutrition Was discharged to [facility] last hospitalization. Appears very frail and weak. PT eval  -I had a limited discussion with his daughter about was of care and option for palliative care. She was very unhappy thinking that we were neglecting his care. I explained to her in detail again about his associative symptoms and underlying kidney disease with severe deconditioning and  malnutrition. She agrees to speak with palliative care regarding further goals of care.  Rheumatoid arthritis  Continue home dose prednisone   Code Status: full  Family Communication: daughter at bedside  Disposition Plan: pending PT eval and palliative care consult  Consultants:  Palliative care consult  Procedures:  none Antibiotics:  none  HPI/Subjective:  Patient seen and examined this morning. Denies any headache. Shortness of breath improved    Objective: Filed Vitals:   09/22/13 1440  BP: 143/68  Pulse: 88  Temp: 98.3 F (36.8 C)  Resp: 18    Intake/Output Summary (Last 24 hours) at 09/22/13 1519 Last data filed at 09/22/13 1442  Gross per 24 hour  Intake    598 ml  Output    300 ml  Net    298 ml   Filed Weights   09/20/13 2300  Weight: 70.2 kg (154 lb 12.2 oz)    Exam:  General: Elderly frail male lying in bed in no acute distress  HEENT: No pallor, moist oral mucosa, no JVD,  Chest: Coarse breath sounds bilaterally, basal crackles,  CVS: Normal S1 and S2, no murmurs rub or gallop  Abdomen: Soft, nontender, nondistended, bowel sounds present  Extremities: Warm, no edema  CNS: AAO x3   Data Reviewed: Basic Metabolic Panel:  Recent Labs Lab 09/20/13 1637 09/22/13 0400  NA 145 144  K 5.1 5.1  CL 103 97  CO2 37* 39*  GLUCOSE 102* 112*  BUN 36* 46*  CREATININE 2.14* 2.46*  CALCIUM 9.5 9.6   Liver  Function Tests:  Recent Labs Lab 09/20/13 1637  AST 14  ALT 22  ALKPHOS 75  BILITOT 0.5  PROT 5.9*  ALBUMIN 2.9*   No results found for this basename: LIPASE, AMYLASE,  in the last 168 hours No results found for this basename: AMMONIA,  in the last 168 hours CBC:  Recent Labs Lab 09/20/13 1637  WBC 12.2*  NEUTROABS 9.8*  HGB 10.3*  HCT 34.2*  MCV 95.0  PLT 127*   Cardiac Enzymes: No results found for this basename: CKTOTAL, CKMB, CKMBINDEX, TROPONINI,  in the last 168 hours BNP (last 3 results)  Recent Labs   08/02/13 1740 08/18/13 0625 09/20/13 1637  PROBNP 7170.0* 1018.0* 1238.0*   CBG: No results found for this basename: GLUCAP,  in the last 168 hours  No results found for this or any previous visit (from the past 240 hour(s)).   Studies: Dg Chest 2 View  09/20/2013   CLINICAL DATA:  Hypertension, COPD  EXAM: CHEST  2 VIEW  COMPARISON:  08/17/2013  FINDINGS: Chronic right hemidiaphragm elevation. Heart is enlarged with vascular and interstitial prominence. Difficult to exclude mild edema. Bibasilar atelectasis present. No definite focal pneumonia. No large effusion or pneumothorax. Trachea is midline.  IMPRESSION: Cardiomegaly with vascular congestion versus early edema  Basilar atelectasis  Chronic right hemidiaphragm elevation   Electronically Signed   By: Ruel Favors M.D.   On: 09/20/2013 17:46   Ct Head Wo Contrast  09/20/2013   CLINICAL DATA:  Pain.  EXAM: CT HEAD WITHOUT CONTRAST  TECHNIQUE: Contiguous axial images were obtained from the base of the skull through the vertex without intravenous contrast.  COMPARISON:  02/27/2013.  FINDINGS: No mass. No hydrocephalus. No hemorrhage. No acute bony abnormality. Visualized paranasal sinuses are clear. Small amount of fluid noted in the left mastoid air cells.  IMPRESSION: 1. Findings suggesting chronic white matter ischemia. Head CT is stable from prior exam. 2. Minimal amount of fluid in the left mastoid air cells.   Electronically Signed   By: Maisie Fus  Register   On: 09/20/2013 17:09   Ct Cervical Spine Wo Contrast  09/20/2013   CLINICAL DATA:  Right posterior head pain, epistaxis, rash  EXAM: CT CERVICAL SPINE WITHOUT CONTRAST  TECHNIQUE: Multidetector CT imaging of the cervical spine was performed without intravenous contrast. Multiplanar CT image reconstructions were also generated. Accompanying CT images of the head of the previously interpreted, please refer to that report.  COMPARISON:  None; correlation made with PET-CT 04/29/2009   FINDINGS: Small amount of fluid within left mastoid air cells.  Bones appear diffusely demineralized.  Prevertebral soft tissues normal thickness.  Skullbase intact.  Marked disc space narrowing with endplate spur formation at C5-C6.  Additional disc space narrowing and spurring identified at C6-C7 and T2-T3.  Minimal retrolisthesis at C5-C6 with 3.7 mm of anterolisthesis at C6-C7.  Minimal superior endplate compression fracture at T3.  Remaining vertebral demonstrate normal height without additional fracture or subluxation.  Multilevel facet degenerative changes.  Question bone islands in C6 vertebral body and spinous process of C4.  Prior median sternotomy.  Scattered carotid atherosclerotic calcifications.  Small nodular foci at the lung apices uncertain etiology.  IMPRESSION: Multilevel degenerative disc and facet disease changes of the cervical spine as above.  No definite acute cervical spine abnormalities.  Fluid within left mastoid air cells.  Numerous nodular foci at the lung apices, some present since 2010 but suspect increased in number, recommend followup CT imaging of the chest for  further evaluation.   Electronically Signed   By: Ulyses Southward M.D.   On: 09/20/2013 17:19    Scheduled Meds: . acidophilus  1 capsule Oral BID  . aspirin EC  81 mg Oral q morning - 10a  . cholecalciferol  2,000 Units Oral q morning - 10a  . docusate sodium  100 mg Oral BID  . escitalopram  10 mg Oral Daily  . furosemide  40 mg Intravenous Q12H  . gabapentin  300 mg Oral QHS  . heparin  5,000 Units Subcutaneous Q8H  . loratadine  10 mg Oral Daily  . metoprolol tartrate  25 mg Oral BID  . pantoprazole  40 mg Oral Daily  . predniSONE  20 mg Oral BID WC  . tamsulosin  0.4 mg Oral QPC supper   Continuous Infusions:     Time spent: 25 minutes    Amal Renbarger  [facility] Pager [phone] If 7PM-7AM, please contact night-coverage at [email or web address], password [facility] 09/22/2013, 3:19 PM  LOS: 2 days

## 2013-09-22 NOTE — Progress Notes (Signed)
UR complete.  Hayly Litsey RN, MSN 

## 2013-09-22 NOTE — Progress Notes (Signed)
Patient [name]      DOB: 24-Feb-1928      VWU:[id number]  Spoke with patient's daughter [name] earliest Jearld Lesch goals of care is Sunday at 130 pm  Will meet family then.  Kayann Maj L. Ladona Ridgel, MD MBA The Palliative Medicine Team at [facility] Phone: [phone] Pager: [phone]

## 2013-09-23 DIAGNOSIS — R5381 Other malaise: Secondary | ICD-10-CM

## 2013-09-23 LAB — BASIC METABOLIC PANEL
BUN: 61 mg/dL — ABNORMAL HIGH (ref 6–23)
Calcium: 9.3 mg/dL (ref 8.4–10.5)
Creatinine, Ser: 2.81 mg/dL — ABNORMAL HIGH (ref 0.50–1.35)
GFR calc Af Amer: 22 mL/min — ABNORMAL LOW (ref >90)
GFR calc non Af Amer: 19 mL/min — ABNORMAL LOW (ref >90)
Potassium: 4.7 mEq/L (ref 3.5–5.1)

## 2013-09-23 MED ORDER — FUROSEMIDE 40 MG PO TABS
40.0000 mg | ORAL_TABLET | Freq: Two times a day (BID) | ORAL | Status: DC
  Administered 2013-09-23 – 2013-09-25 (×4): 40 mg via ORAL
  Filled 2013-09-23 (×6): qty 1

## 2013-09-23 MED ORDER — LIDOCAINE 5 % EX PTCH
1.0000 | MEDICATED_PATCH | CUTANEOUS | Status: DC
  Administered 2013-09-23 – 2013-10-02 (×9): 1 via TRANSDERMAL
  Filled 2013-09-23 (×10): qty 1

## 2013-09-23 NOTE — Progress Notes (Signed)
[facility] PROGRESS NOTE  [name] ZOX:[id number] DOB: March 01, 1928 DOA: 09/20/2013 PCP: [name], MD  Brief narrative  79 y.o. Male with hx of HTN, arthritis, DDD, rheumatoid arthritis on chronic low dose steroids, CKD III, brought to the ER with pain in the right occiput. Has tenderness to a certain area. No blurry vx, stiff neck, confusion, photophobia, or diffuse HA. He also has been feeling weak as well. Work up in the ER included a negative head CT, Cr of 2.1, and WBC of 12.2K. He was given dilaudid with improvement of his pain, and was about to be discharged to home by EDP, but family was not comfortable taking him home, as his wife would not be able to get pain meds for him, and was concerned with his pain coming back. Hospitalist was asked to admit him for pain control .   Assessment/Plan:  Headache  Appears non specific. Head CT negative. ESR mildly elevated.. Continue tylenol. Prn dilaudid for pain . Symptoms not resolved and appears to be related to muscle sprain/ thousand.   Shortness of breath  Patient appeared congested on exam. CXR showing mild pulm edema. Pt has diastolic CHF and COPD. placed in IV lasix bid and scheduled nebs. Symptoms improving. Will reduce dose of Lasix. continue o2 vis [place]  Monitor I/O  Recent echo normal  - CKD stage 3  Baseline creatinine of 1.6-2. Worsened in a.m. labs on Lasix. Will reduce dose of Lasix as symptoms improving  Chronic bilateral hydronephrosis  Patient has recurrent UTIs. continue flomax . Follows with urologist as outpt   Deconditioning and severe malnutrition  Was discharged to [facility] hospitalization. Appears very frail and weak. PT eval  -I had a  discussion with his daughter about was of care and option for palliative care. She agrees to speak with palliative care regarding further goals of care.   Rheumatoid arthritis  Continue home dose prednisone   Code Status: full  Family Communication: None at bedside   Disposition Plan: pending PT eval and palliative care consult (scheduled for tomorrow)   Consultants:  Palliative care consult   Procedures:  none Antibiotics:  none    HPI/Subjective: Patient seen and examined this morning. He informs or shortness of breath to be better  Objective: Filed Vitals:   09/23/13 0938  BP: 140/84  Pulse: 69  Temp: 97.4 F (36.3 C)  Resp: 18    Intake/Output Summary (Last 24 hours) at 09/23/13 1437 Last data filed at 09/23/13 1100  Gross per 24 hour  Intake    518 ml  Output   1925 ml  Net  -1407 ml   Filed Weights   09/20/13 2300  Weight: 70.2 kg (154 lb 12.2 oz)    Exam:  General: Elderly frail male lying in bed in no acute distress  HEENT: No pallor, moist oral mucosa, no JVD,  Chest: Coarse breath sounds bilaterally, basal crackles,  CVS: Normal S1 and S2, no murmurs rub or gallop  Abdomen: Soft, nontender, nondistended, bowel sounds present  Extremities: Warm, no edema  CNS: AAO x3   Data Reviewed: Basic Metabolic Panel:  Recent Labs Lab 09/20/13 1637 09/22/13 0400 09/23/13 0854  NA 145 144 139  K 5.1 5.1 4.7  CL 103 97 93*  CO2 37* 39* 39*  GLUCOSE 102* 112* 94  BUN 36* 46* 61*  CREATININE 2.14* 2.46* 2.81*  CALCIUM 9.5 9.6 9.3   Liver Function Tests:  Recent Labs Lab 09/20/13 1637  AST 14  ALT 22  ALKPHOS 75  BILITOT 0.5  PROT 5.9*  ALBUMIN 2.9*   No results found for this basename: LIPASE, AMYLASE,  in the last 168 hours No results found for this basename: AMMONIA,  in the last 168 hours CBC:  Recent Labs Lab 09/20/13 1637  WBC 12.2*  NEUTROABS 9.8*  HGB 10.3*  HCT 34.2*  MCV 95.0  PLT 127*   Cardiac Enzymes: No results found for this basename: CKTOTAL, CKMB, CKMBINDEX, TROPONINI,  in the last 168 hours BNP (last 3 results)  Recent Labs  08/02/13 1740 08/18/13 0625 09/20/13 1637  PROBNP 7170.0* 1018.0* 1238.0*   CBG: No results found for this basename: GLUCAP,  in the last 168  hours  No results found for this or any previous visit (from the past 240 hour(s)).   Studies: No results found.  Scheduled Meds: . acidophilus  1 capsule Oral BID  . aspirin EC  81 mg Oral q morning - 10a  . cholecalciferol  2,000 Units Oral q morning - 10a  . docusate sodium  100 mg Oral BID  . escitalopram  10 mg Oral Daily  . furosemide  40 mg Intravenous Q12H  . gabapentin  300 mg Oral QHS  . heparin  5,000 Units Subcutaneous Q8H  . loratadine  10 mg Oral Daily  . metoprolol tartrate  25 mg Oral BID  . pantoprazole  40 mg Oral Daily  . predniSONE  20 mg Oral BID WC  . tamsulosin  0.4 mg Oral QPC supper   Continuous Infusions:    Time spent: 25 minutes    Delano Frate  [facility] Pager [phone] If 7PM-7AM, please contact night-coverage at [email or web address], password [facility] 09/23/2013, 2:37 PM  LOS: 3 days

## 2013-09-23 NOTE — Evaluation (Signed)
Occupational Therapy Evaluation Patient Details Name: [name] MRN: [id number] DOB: 10/18/1927 Today's Date: 09/23/2013 Time: 4782-9562 OT Time Calculation (min): 35 min  OT Assessment / Plan / Recommendation History of present illness 79 y.o. male with PMHx of COPD, recurrent UTIs and recent treatment for cellulitis who had called the EMS reportedly for shortness of breath. However it is of note the patient denies ever having shortness of breath. His main complaint was of not feeling well overall with some pain in his lower extremities   Clinical Impression   Pt admitted with above.  He demonstrates the below listed deficits and will benefit from continued OT to maximize safety and independence with BADLs.  Pt required significant (max) coaxing and encouragement to move OOB.  Pt citing Rt neck/shoulder pain and generalized weakness for reason he couldn't get OOB.   Pt reluctantly agreed to transfer to recliner for dinner.  Attempted gentle massag, myofascial release as pt with significant muscular tightness, but pt unable to tolerate the lightest of touch.  Ice applied, RN notified, and pt positioned with towel roll lengthwise along his spine to encourage thoracic and cervical extension as he is kyphotic and maintains head/neck in flexed posture.  He will likely require SNF at discharge as he is currently requiring max to total A for all BADLs, and min A for transfers.     OT Assessment  Patient needs continued OT Services    Follow Up Recommendations  SNF;Supervision/Assistance - 24 hour    Barriers to Discharge Decreased caregiver support unsure that family can provide level of care that pt is needing   Equipment Recommendations  None recommended by OT    Recommendations for Other Services    Frequency  Min 2X/week    Precautions / Restrictions Precautions Precautions: Fall Restrictions Weight Bearing Restrictions: No   Pertinent Vitals/Pain     ADL  Eating/Feeding:  Maximal assistance Where Assessed - Eating/Feeding: Chair Grooming: Wash/dry hands;Wash/dry face;Brushing hair;Moderate assistance Where Assessed - Grooming: Supported sitting Upper Body Bathing: Maximal assistance Where Assessed - Upper Body Bathing: Supported sitting Lower Body Bathing: +1 Total assistance Where Assessed - Lower Body Bathing: Supported sit to stand Upper Body Dressing: Maximal assistance Where Assessed - Upper Body Dressing: Unsupported sitting Lower Body Dressing: +1 Total assistance Where Assessed - Lower Body Dressing: Supported sit to Pharmacist, hospital: Minimal assistance Toilet Transfer Method: Sit to stand;Stand pivot Toileting - Clothing Manipulation and Hygiene: +1 Total assistance Where Assessed - Toileting Clothing Manipulation and Hygiene: Standing Transfers/Ambulation Related to ADLs: min A ADL Comments: Pt required maximal coaxing/encouragement to get OOB.  He refused all other activity citing Rt. neck pain and weakness.  Pt would not feed himself insisting that dtr feed him.  UEs difficult to asses due to neck/shoulder pain.  Pt with significant tighness in the musculature of this area, but does not tolerate the lightest touch to this area therefore could not attempt myofascial release or gentle massage.      OT Diagnosis: Generalized weakness;Cognitive deficits;Acute pain  OT Problem List: Decreased strength;Decreased activity tolerance;Impaired balance (sitting and/or standing);Decreased cognition;Decreased safety awareness;Decreased knowledge of use of DME or AE;Pain OT Treatment Interventions: Self-care/ADL training;Therapeutic exercise;DME and/or AE instruction;Therapeutic activities;Patient/family education;Balance training;Cognitive remediation/compensation;Manual therapy   OT Goals(Current goals can be found in the care plan section) Acute Rehab OT Goals Patient Stated Goal: pt did not state. OT Goal Formulation: With patient/family Time For  Goal Achievement: 10/07/13 Potential to Achieve Goals: Fair ADL Goals Pt Will Perform  Grooming: with min assist;standing Pt Will Perform Upper Body Bathing: with min assist;sitting Pt Will Perform Lower Body Bathing: with mod assist;sit to/from stand Pt Will Transfer to Toilet: with min assist;ambulating;regular height toilet;bedside commode;grab bars Pt Will Perform Toileting - Clothing Manipulation and hygiene: with min assist;sit to/from stand Additional ADL Goal #1: Pt will be independent with neck and UE AROM  Visit Information  Last OT Received On: 09/23/13 History of Present Illness: 79 y.o. male with PMHx of COPD, recurrent UTIs and recent treatment for cellulitis who had called the EMS reportedly for shortness of breath. However it is of note the patient denies ever having shortness of breath. His main complaint was of not feeling well overall with some pain in his lower extremities       Prior Functioning     Home Living Family/patient expects to be discharged to:: Private residence Living Arrangements: Spouse/significant other;Other (Comment) (daughter) Available Help at Discharge: Family;Other (Comment) (may need ST SNF if doesn't get moving well.) Type of Home: House Home Access: Ramped entrance Home Layout: One level Home Equipment: Walker - 2 wheels Additional Comments: per pt he was not on home oxygen (per daughter needed oxygen at times) Prior Function Level of Independence: Needs assistance Gait / Transfers Assistance Needed: pt able to amb with RW throughout house without (A) from daughter  (but more and more family is following him) ADL's / Homemaking Assistance Needed: per pt he takes sponge baths and can clean himself but requires setup (A); daughter is responsible for homemaking   Communication Communication: HOH         Vision/Perception     Cognition  Cognition Arousal/Alertness: Awake/alert Behavior During Therapy: WFL for tasks  assessed/performed Overall Cognitive Status: Impaired/Different from baseline Area of Impairment: Orientation;Safety/judgement;Awareness;Problem solving Orientation Level: Disoriented to;Place Safety/Judgement: Decreased awareness of safety;Decreased awareness of deficits Problem Solving: Slow processing;Requires verbal cues;Requires tactile cues General Comments: Pt notably confused.  Thinks there is an old man that has been left in his bathroom.  When asked where that bathroom is, he states at the restaurant    Extremity/Trunk Assessment Upper Extremity Assessment Upper Extremity Assessment: Generalized weakness;Difficult to assess due to impaired cognition Lower Extremity Assessment Lower Extremity Assessment: Defer to PT evaluation     Mobility Bed Mobility Bed Mobility: Supine to Sit;Sitting - Scoot to Edge of Bed Supine to Sit: 4: Min assist Sitting - Scoot to [place] of Bed: 4: Min assist Details for Bed Mobility Assistance: Assist to lift shoulders and scoot hips Transfers Transfers: Sit to Stand;Stand to Sit Sit to Stand: 4: Min assist;With upper extremity assist;From bed Stand to Sit: 4: Min assist;With upper extremity assist;To chair/3-in-1 Details for Transfer Assistance: cues for hand placement and to pivot     Exercise     Balance Balance Balance Assessed: Yes Static Standing Balance Static Standing - Balance Support: Bilateral upper extremity supported Static Standing - Level of Assistance: 4: Min assist Static Standing - Comment/# of Minutes: 1 mins   End of Session OT - End of Session Activity Tolerance: Patient limited by pain Patient left: in chair;with call bell/phone within reach;with family/visitor present Nurse Communication: Patient requests pain meds  GO     Avalynne Diver M 09/23/2013, 6:30 PM

## 2013-09-24 DIAGNOSIS — N289 Disorder of kidney and ureter, unspecified: Secondary | ICD-10-CM

## 2013-09-24 DIAGNOSIS — R0609 Other forms of dyspnea: Secondary | ICD-10-CM

## 2013-09-24 LAB — BASIC METABOLIC PANEL WITH GFR
BUN: 71 mg/dL — ABNORMAL HIGH (ref 6–23)
CO2: 37 meq/L — ABNORMAL HIGH (ref 19–32)
Calcium: 9.3 mg/dL (ref 8.4–10.5)
Chloride: 90 meq/L — ABNORMAL LOW (ref 96–112)
Creatinine, Ser: 2.94 mg/dL — ABNORMAL HIGH (ref 0.50–1.35)
GFR calc Af Amer: 21 mL/min — ABNORMAL LOW (ref >90)
GFR calc non Af Amer: 18 mL/min — ABNORMAL LOW (ref >90)
Glucose, Bld: 75 mg/dL (ref 70–99)
Potassium: 4.8 meq/L (ref 3.5–5.1)
Sodium: 139 meq/L (ref 135–145)

## 2013-09-24 NOTE — Progress Notes (Addendum)
[facility] PROGRESS NOTE  [name] UJW:[id number] DOB: [birth year] DOA: 09/20/2013 PCP: [name], MD  Brief narrative  79 y.o. Male with hx of HTN, arthritis, DDD, rheumatoid arthritis on chronic low dose steroids, CKD III, brought to the ER with pain in the right occiput. Has tenderness to a certain area. No blurry vision, stiff neck, confusion, photophobia, or diffuse HA. He also has been feeling weak as well. Work up in the ER included a negative head CT, Cr of 2.1, and WBC of 12.2K. He was given dilaudid with improvement of his pain, and was about to be discharged to home by EDP, but family was not comfortable taking him home, as his wife would not be able to get pain meds for him, and was concerned with his pain coming back. Hospitalist was asked to admit him for pain control . Hospital course prolonged due to shortness of breath with CHF and COPD.   Assessment/Plan:  Headache  Appears non specific and likely muscle sprain. Head CT negative. ESR mildly elevated.. Continue tylenol. Prn dilaudid for pain . Symptoms not resolved and appears to be related to muscle sprain. Ordered  lidoderm patch.   Shortness of breath  Patient appeared congested on exam. CXR showing mild pulm edema. Pt has diastolic CHF and COPD. placed in IV lasix bid and scheduled nebs. Symptoms improving.  reduced dose of Lasix to 40 mg po bid. continue o2 via [place]  Monitor I/O  Recent echo with  normal EF  AKI on CKD stage 3  Baseline creatinine of 1.6-2. Worsened  on Lasix. Will reduced dose of Lasix as symptoms improving. Creatinine of 2.9 today. continue to monitor.   Chronic bilateral hydronephrosis  Patient has recurrent UTIs. continue flomax . Follows with urologist as outpt   Deconditioning and severe malnutrition  Was discharged to [facility] last hospitalization. Appears very frail and weak. PT eval  -I had a  discussion with his daughter about goals  of care and option for palliative care. She agrees  to speak with palliative care regarding further goals of care.  Plan is to meet with palliative care today.   Rheumatoid arthritis  Continue home dose prednisone   Code Status: full   Family Communication: None at bedside ( daughter [name] is involved in care)  Disposition Plan: pending PT eval recommends SNF. SW consulted.  Meeting with palliative care consult (scheduled for today )   Consultants:  Palliative care consult   Procedures:  none Antibiotics:  none    HPI/Subjective: Patient seen and examined this morning. He informs or shortness of breath to be better. Denies neck pain today.   Objective: Filed Vitals:   09/24/13 0541  BP: 132/75  Pulse: 66  Temp: 97.8 F (36.6 C)  Resp: 18    Intake/Output Summary (Last 24 hours) at 09/24/13 1203 Last data filed at 09/24/13 0830  Gross per 24 hour  Intake    120 ml  Output      0 ml  Net    120 ml   Filed Weights   09/20/13 2300  Weight: 70.2 kg (154 lb 12.2 oz)    Exam:  General: Elderly frail male lying in bed in no acute distress  HEENT: No pallor, moist oral mucosa, no JVD,  Chest: Coarse breath sounds bilaterally, basal crackles,  CVS: Normal S1 and S2, no murmurs rub or gallop  Abdomen: Soft, nontender, nondistended, bowel sounds present  Extremities: Warm, no edema , chronic skin changes, macular rash  Over the trunk andextremities CNS: AAO x3   Data Reviewed: Basic Metabolic Panel:  Recent Labs Lab 09/20/13 1637 09/22/13 0400 09/23/13 0854 09/24/13 0738  NA 145 144 139 139  K 5.1 5.1 4.7 4.8  CL 103 97 93* 90*  CO2 37* 39* 39* 37*  GLUCOSE 102* 112* 94 75  BUN 36* 46* 61* 71*  CREATININE 2.14* 2.46* 2.81* 2.94*  CALCIUM 9.5 9.6 9.3 9.3   Liver Function Tests:  Recent Labs Lab 09/20/13 1637  AST 14  ALT 22  ALKPHOS 75  BILITOT 0.5  PROT 5.9*  ALBUMIN 2.9*   No results found for this basename: LIPASE, AMYLASE,  in the last 168 hours No results found for this basename: AMMONIA,   in the last 168 hours CBC:  Recent Labs Lab 09/20/13 1637  WBC 12.2*  NEUTROABS 9.8*  HGB 10.3*  HCT 34.2*  MCV 95.0  PLT 127*   Cardiac Enzymes: No results found for this basename: CKTOTAL, CKMB, CKMBINDEX, TROPONINI,  in the last 168 hours BNP (last 3 results)  Recent Labs  08/02/13 1740 08/18/13 0625 09/20/13 1637  PROBNP 7170.0* 1018.0* 1238.0*   CBG: No results found for this basename: GLUCAP,  in the last 168 hours  No results found for this or any previous visit (from the past 240 hour(s)).   Studies: No results found.  Scheduled Meds: . acidophilus  1 capsule Oral BID  . aspirin EC  81 mg Oral q morning - 10a  . cholecalciferol  2,000 Units Oral q morning - 10a  . docusate sodium  100 mg Oral BID  . escitalopram  10 mg Oral Daily  . furosemide  40 mg Oral BID  . gabapentin  300 mg Oral QHS  . heparin  5,000 Units Subcutaneous Q8H  . lidocaine  1 patch Transdermal Q24H  . loratadine  10 mg Oral Daily  . metoprolol tartrate  25 mg Oral BID  . pantoprazole  40 mg Oral Daily  . predniSONE  20 mg Oral BID WC  . tamsulosin  0.4 mg Oral QPC supper   Continuous Infusions:    Time spent: 25 minutes    [name]  [facility] Pager [phone] If 7PM-7AM, please contact night-coverage at [email or web address], password [email or web address] 09/24/2013, 12:03 PM  LOS: 4 days

## 2013-09-24 NOTE — Progress Notes (Signed)
Orthopedic Tech Progress Note Patient Details:  [name] October 29, 1927 [id number]  Ortho Devices Type of Ortho Device: Soft collar Ortho Device/Splint Location: neck Ortho Device/Splint Interventions: Ordered;Application   [name] 09/24/2013, 4:22 PM

## 2013-09-24 NOTE — Consult Note (Signed)
[name] WU:[id number] [name]      DOB: 03-22-1928      YNW:[id number]  Summary of Goals of Care, full note to follow   Met with [name]'s three daughters. [name] was recently admitted in ICU with Aspiration pneumonia and Acute on Chronic UTI.  [name] spent some time at [facility] and only recently returned home .  [name] was admitted complaining of right sided neck and shoulder pain.  [name] was admitted for pain control.  [name] gets some relief with opiates.  [name] Neck CT scan was negative  Obvious Rheumatoid involvement of cervical spine, aside from degenerative changes.  [name] also has no evidence for a rash and [name] does not report lightening pain down either arms.  [name] state if [name] holds [name] neck it feels better and [name] has had acupuncture treatments by Dr. Ricki Miller in the past.  Excellent pain control is precluded by chronic renal failure which would not permit NSAID.  [name] Prednisone has already been increased to account for the stress, [name] is chronically on 5 mg.   [name] daughters and I talked about [name] declining course of health.  They have spoke with Dr. Ricki Miller about [name] chronic illnesses and they were all in favor of a milder comfort approach.  None of the daughters had in their mind that some day [name] would start dialysis and they are starting to see the connection between [name] kidneys and [name] heart disease.  [facility] Tuesday and I started talking with them about how hospice might be helpful sooner rather than later to help them keep [name] care focused at home.  I provided a MOST form for them to review and a Hard Choices Book.  They shared with me they had not done advanced directive planning because they thought if they changed him to a DNR that [name] would receive other needed treatments.  They stated "we want everything done until [name] is brain dead", and yet they realize that [name] is declining and that [name] quality of life is being affected.  They have an appointment with Dr. Ricki Miller on 1/28 and  could review our meeting and materials with him before committing.  They were open and receptive to Hospice possibly getting involved if [name] continues to do poorly .  They can self refer or Dr. Ricki Miller can refer them as an out [name] once they see how [name] is going to do.   [name] remains with significant cough with and without liquids.  Might want to reeval with speech before discharge and have PT continue to work with consideration for home PT.    Recommend: 1.  Honor full code status at this time, but I challenged them to work with Dr. Ricki Miller on the Most after looking through the books.  2.  Neck Pain: continue lidocaine, tramadol, and will trial soft collar to see if supporting it will permit it to feel better. Reviewed CT with radiology doubt any local intervention.  I alerted daughters to watch for blisters, just incase shingles could be lurking.    3.  Cough : consider reeval with speech, agree with treating chf.  Total time:  130 pm - 230 pm.   Hrithik Boschee L. Ladona Ridgel, MD MBA The Palliative Medicine Team at [facility] Phone: [phone] Pager: [phone]

## 2013-09-24 NOTE — Consult Note (Signed)
Patient [name] NEEDS      DOB: 23-Mar-1928      OZH:[id number]     Consult Note from the Palliative Medicine Team at [facility]    Consult Requested by: Dr. Gonzella Lex     PCP: [name], MD Reason for Consultation: goals      Phone Number:[phone] Symptom management 1. Assessment of patients Current state: 79 yr old white male with pain of unclear origin and intensity involving his right shoulder and occiput .  All radiologic images are negative.  I reviewed the patient's continued poor recovery with his daughters.  They recognize he is not doing well.  [facility]  Dr. Ricki Miller has also been talking with them about his chronic medical illnesses .  In particular they understand that if his kidneys worsen that they would not consider dialysis and so comfort care made sense to them.  At this time , however, the patient's primary care giving daughter wants to think about code status.  I have provided MOsT form to review and hard choices booklets.  They agree that they would like to try to control his pain and get him home as soon as possible.  I spoke with them about hospice care in the near future if his kidneys continue to fail.   Goals of Care: 1.  Code Status: Full code   2. Scope of Treatment: Continue curative care including antibiotics, pain medication  4. Disposition: home with as much support as patient can have.   3. Symptom Management:    1. Pain: add lidocaine patch, and soft collar.  Patient does ok with tramadol 2. Consider reevaluation by speech suspect continued aspiration. 3. Vascular congestion: treat heart failure.  4. Psychosocial: lives with his daughter.  Has a wife who is separeated but somewhat involved in his care.  5. Spiritual:  Not desired at this time        Patient Documents Completed or Given: Document Given Completed  Advanced Directives Pkt    MOST    DNR    Gone from My Sight    Hard Choices       Brief HPI: 79 yr old white male admitted with uncontrolled pain in his right shoulder.  Patient is s/p recent life threatening admission to the  And has been home from rehab for less than a week.  He complains of right sided neck and occipital pain.    ROS: right sided neck and head pain, chronic cough, generalized weakness    PMH:  Past Medical History  Diagnosis Date  . Hypertension   . Arthritis   . Ex-smoker   . Rheumatoid arthritis(714.0)   . Pulmonary nodule   . DDD (degenerative disc disease)   . Complication of anesthesia     " I am difficult to wake up "  . Chronic kidney disease     acute 2013, 2014 on chronic stage 3.  . Anemia   . Proteus septicemia 02/2013  . Barrett's esophagus 04/2006    egd by dr Virginia Rochester.   . Colon polyp 04/2006    Hyperplastic on colonoscopy by Dr Virginia Rochester.   . Encephalopathy, metabolic 02/2013     PSH: Past Surgical History  Procedure Laterality Date  . Joint replacement      bilateral knee replacement  . Hernia repair    . Back surgery      X 2  . Appendectomy  I have reviewed the FH and SH and  If appropriate update it with new information. Allergies  Allergen Reactions  . Codeine Other (See Comments)     drives me crazy  . Hydrocodone Other (See Comments)    Makes patient hallucinate, cannot sleep  . Oxycodone Other (See Comments)    Makes patient hallucinate, cannot sleep  . Penicillins Hives  . Sulfa Antibiotics Other (See Comments)    Per MAR   Scheduled Meds: . acidophilus  1 capsule Oral BID  . aspirin EC  81 mg Oral q morning - 10a  . cholecalciferol  2,000 Units Oral q morning - 10a  . docusate sodium  100 mg Oral BID  . escitalopram  10 mg Oral Daily  . furosemide  40 mg Oral BID  . gabapentin  300 mg Oral QHS  . heparin  5,000 Units Subcutaneous Q8H  . lidocaine  1 patch Transdermal Q24H  . loratadine  10 mg Oral Daily  . metoprolol tartrate  25 mg Oral BID  . pantoprazole  40 mg Oral Daily  . predniSONE  20  mg Oral BID WC  . tamsulosin  0.4 mg Oral QPC supper   Continuous Infusions:  PRN Meds:.albuterol, camphor-menthol, guaiFENesin-dextromethorphan, hydrALAZINE, hydrocortisone cream, HYDROmorphone (DILAUDID) injection, ondansetron (ZOFRAN) IV, ondansetron    BP 83/43  Pulse 106  Temp(Src) 98.6 F (37 C) (Oral)  Resp 19  Ht 5\' 5"  (1.651 m)  Wt 70.2 kg (154 lb 12.2 oz)  BMI 25.75 kg/m2  SpO2 99%   PPS: 40%  Intake/Output Summary (Last 24 hours) at 09/24/13 1541 Last data filed at 09/24/13 1532  Gross per 24 hour  Intake    120 ml  Output     60 ml  Net     60 ml    Physical Exam:  General: Minimal distress, sitting in bed if asked he will complain of neck and shoulder pain HEENT:  Pupils equal round reactive to light, cognition appears to be slow mucous membranes are dry Chest:   Decreased with some coarse wet rhonchi in the upper airway CVS: Regular rate rhythm positive S1 and appreciated stairs for Abdomen: Slightly obese nontender nondistended Ext: Patient able to move the arm on the right and left passively without complaint of pain extended range of motion does cause patient to state it hurts Neuro: Patient oriented to himself but does not appear to be completely able to recall facts of the last few days  Labs: CBC    Component Value Date/Time   WBC 12.2* 09/20/2013 1637   RBC 3.60* 09/20/2013 1637   RBC 3.16* 06/05/2012 0523   HGB 10.3* 09/20/2013 1637   HCT 34.2* 09/20/2013 1637   PLT 127* 09/20/2013 1637   MCV 95.0 09/20/2013 1637   MCH 28.6 09/20/2013 1637   MCHC 30.1 09/20/2013 1637   RDW 19.5* 09/20/2013 1637   LYMPHSABS 1.2 09/20/2013 1637   MONOABS 1.1* 09/20/2013 1637   EOSABS 0.1 09/20/2013 1637   BASOSABS 0.0 09/20/2013 1637      CMP     Component Value Date/Time   NA 139 09/24/2013 0738   K 4.8 09/24/2013 0738   CL 90* 09/24/2013 0738   CO2 37* 09/24/2013 0738   GLUCOSE 75 09/24/2013 0738   BUN 71* 09/24/2013 0738   CREATININE 2.94*  09/24/2013 0738   CALCIUM 9.3 09/24/2013 0738   PROT 5.9* 09/20/2013 1637   ALBUMIN 2.9* 09/20/2013 1637   AST 14 09/20/2013 1637  ALT 22 09/20/2013 1637   ALKPHOS 75 09/20/2013 1637   BILITOT 0.5 09/20/2013 1637   GFRNONAA 18* 09/24/2013 0738   GFRAA 21* 09/24/2013 0738    Chest Xray Reviewed/Impressions:Cardiomegaly with vascular congestion versus early edema  Basilar atelectasis  Chronic right hemidiaphragm elevation   CT scan of the Head Reviewed/Impressions: 1. Findings suggesting chronic white matter ischemia. Head CT is  stable from prior exam.  2. Minimal amount of fluid in the left mastoid air cells    Time In Time Out Total Time Spent with Patient Total Overall Time  1:30 PM   2:30 PM   15 minutes   60 minutes     Greater than 50%  of this time was spent counseling and coordinating care related to the above assessment and plan.  Parker Sawatzky L. Ladona Ridgel, MD MBA The Palliative Medicine Team at [facility] Phone: [phone] Pager: [phone]

## 2013-09-25 LAB — BASIC METABOLIC PANEL
BUN: 94 mg/dL — ABNORMAL HIGH (ref 6–23)
CO2: 38 mEq/L — ABNORMAL HIGH (ref 19–32)
Calcium: 8.5 mg/dL (ref 8.4–10.5)
GFR calc non Af Amer: 15 mL/min — ABNORMAL LOW (ref >90)
Glucose, Bld: 128 mg/dL — ABNORMAL HIGH (ref 70–99)
Sodium: 139 mEq/L (ref 135–145)

## 2013-09-25 MED ORDER — ALBUTEROL SULFATE (2.5 MG/3ML) 0.083% IN NEBU: 2.5000 mg | INHALATION_SOLUTION | RESPIRATORY_TRACT | Status: DC | PRN

## 2013-09-25 NOTE — Progress Notes (Signed)
UR complete.  Candace Begue RN, MSN 

## 2013-09-25 NOTE — Progress Notes (Signed)
[facility] PROGRESS NOTE  LASHUN MCCANTS ZOX:[id number] DOB: 1928-01-10 DOA: 09/20/2013 PCP: [name], MD  Brief narrative  79 y.o. Male with hx of HTN, arthritis, DDD, rheumatoid arthritis on chronic low dose steroids, CKD III, brought to the ER with pain in the right occiput. Has tenderness to a certain area. No blurry vision, stiff neck, confusion, photophobia, or diffuse HA. He also has been feeling weak as well. Work up in the ER included a negative head CT, Cr of 2.1, and WBC of 12.2K. He was given dilaudid with improvement of his pain, and was about to be discharged to home by EDP, but family was not comfortable taking him home, as his wife would not be able to get pain meds for him, and was concerned with his pain coming back. Hospitalist was asked to admit him for pain control . Hospital course prolonged due to shortness of breath with CHF and COPD.   Assessment/Plan:  Headache  Appears non specific and likely muscle sprain. Head CT negative. ESR mildly elevated.. Continue tylenol. Prn dilaudid for pain . Symptoms not resolved and appears to be related to muscle sprain.  lidoderm patch.   Shortness of breath   Symptoms improving.   Holding lasix as Cr has worsened Recent echo with  normal EF  AKI on CKD stage 3  Baseline creatinine of 1.6-2.  Worsened  on Lasix.  Holding lasix Trend BMP -hold on IVF  Chronic bilateral hydronephrosis  Patient has recurrent UTIs. continue flomax . Follows with urologist as outpt   Deconditioning and severe malnutrition  Was discharged to [facility] last hospitalization. Appears very frail and weak. PT eval  -palliative care following SNF recommended by PT   Rheumatoid arthritis  Continue home dose prednisone   Code Status: full   Family Communication: None at bedside (daughter [name] is involved in care)  Disposition Plan: pending PT eval recommends SNF. SW consulted.  Meeting with palliative care consult (scheduled for today )    Consultants:  Palliative care consult   Procedures:  none Antibiotics:  none    HPI/Subjective: No complaints this AM.   Objective: Filed Vitals:   09/25/13 0651  BP: 125/72  Pulse: 58  Temp: 97.8 F (36.6 C)  Resp: 16    Intake/Output Summary (Last 24 hours) at 09/25/13 1001 Last data filed at 09/25/13 0651  Gross per 24 hour  Intake      0 ml  Output    710 ml  Net   -710 ml   Filed Weights   09/20/13 2300  Weight: 70.2 kg (154 lb 12.2 oz)    Exam:  General: Elderly frail male lying in bed in no acute distress  HEENT: No pallor, moist oral mucosa, no JVD,  Chest: clear anterior  CVS: Normal S1 and S2, no murmurs rub or gallop  Abdomen: Soft, nontender, nondistended, bowel sounds present  Extremities: Warm, no edema , chronic skin changes, macular rash  Over the trunk andextremities CNS: AAO x3   Data Reviewed: Basic Metabolic Panel:  Recent Labs Lab 09/20/13 1637 09/22/13 0400 09/23/13 0854 09/24/13 0738 09/25/13 0519  NA 145 144 139 139 139  K 5.1 5.1 4.7 4.8 4.7  CL 103 97 93* 90* 91*  CO2 37* 39* 39* 37* 38*  GLUCOSE 102* 112* 94 75 128*  BUN 36* 46* 61* 71* 94*  CREATININE 2.14* 2.46* 2.81* 2.94* 3.42*  CALCIUM 9.5 9.6 9.3 9.3 8.5   Liver Function Tests:  Recent Labs Lab 09/20/13  1637  AST 14  ALT 22  ALKPHOS 75  BILITOT 0.5  PROT 5.9*  ALBUMIN 2.9*   No results found for this basename: LIPASE, AMYLASE,  in the last 168 hours No results found for this basename: AMMONIA,  in the last 168 hours CBC:  Recent Labs Lab 09/20/13 1637  WBC 12.2*  NEUTROABS 9.8*  HGB 10.3*  HCT 34.2*  MCV 95.0  PLT 127*   Cardiac Enzymes: No results found for this basename: CKTOTAL, CKMB, CKMBINDEX, TROPONINI,  in the last 168 hours BNP (last 3 results)  Recent Labs  08/02/13 1740 08/18/13 0625 09/20/13 1637  PROBNP 7170.0* 1018.0* 1238.0*   CBG: No results found for this basename: GLUCAP,  in the last 168 hours  No results  found for this or any previous visit (from the past 240 hour(s)).   Studies: No results found.  Scheduled Meds: . acidophilus  1 capsule Oral BID  . aspirin EC  81 mg Oral q morning - 10a  . cholecalciferol  2,000 Units Oral q morning - 10a  . docusate sodium  100 mg Oral BID  . escitalopram  10 mg Oral Daily  . gabapentin  300 mg Oral QHS  . heparin  5,000 Units Subcutaneous Q8H  . lidocaine  1 patch Transdermal Q24H  . loratadine  10 mg Oral Daily  . metoprolol tartrate  25 mg Oral BID  . pantoprazole  40 mg Oral Daily  . predniSONE  20 mg Oral BID WC  . tamsulosin  0.4 mg Oral QPC supper   Continuous Infusions:    Time spent: 25 minutes    Ritika Hellickson  [facility] Pager [phone] If 7PM-7AM, please contact night-coverage at [email or web address], password [facility] 09/25/2013, 10:01 AM  LOS: 5 days

## 2013-09-25 NOTE — Progress Notes (Signed)
Physical Therapy Treatment Patient Details Name: [name] MRN: [id number] DOB: 06-20-1928 Today's Date: 09/25/2013 Time: 4098-1191 PT Time Calculation (min): 21 min  PT Assessment / Plan / Recommendation  History of Present Illness 79 y.o. male with PMHx of COPD, recurrent UTIs and recent treatment for cellulitis who had called the EMS reportedly for shortness of breath. However it is of note the patient denies ever having shortness of breath. His main complaint was of not feeling well overall with some pain in his lower extremities   PT Comments   Patient continues to demonstrate persistent neck pain with all aspects of mobility. Patient was able to tolerate ambulation to the chair but required increased time to do so with min guard.  Will continue to see and progress as tolerated. Continue to recommend SNF.   Follow Up Recommendations  SNF           Equipment Recommendations  None recommended by PT       Frequency Min 3X/week   Progress towards PT Goals Progress towards PT goals: Progressing toward goals (modestly)  Plan Current plan remains appropriate    Precautions / Restrictions Precautions Precautions: Fall Restrictions Weight Bearing Restrictions: No   Pertinent Vitals/Pain No numerical value given, patient continuously stating that his neck hurts at all times    Mobility  Bed Mobility Bed Mobility: Supine to Sit;Sitting - Scoot to Edge of Bed Supine to Sit: 4: Min assist Sitting - Scoot to [place] of Bed: 4: Min guard Details for Bed Mobility Assistance: increased time to perform, assist to bring trunk to elevated seated position Transfers Transfers: Sit to Stand;Stand to Sit Sit to Stand: 4: Min assist;From elevated surface;With upper extremity assist;From bed;From toilet Stand to Sit: 4: Min assist;With upper extremity assist;To chair/3-in-1;To toilet Details for Transfer Assistance: assist to elevate to standing position, cues for hand  placement Ambulation/Gait Ambulation/Gait Assistance: 4: Min assist Ambulation Distance (Feet): 12 Feet Assistive device: Rolling walker Ambulation/Gait Assistance Details: unsteady slow cadence with short shuffling steps Stairs: No Wheelchair Mobility Wheelchair Mobility: No      PT Goals (current goals can now be found in the care plan section) Acute Rehab PT Goals Patient Stated Goal: pt did not state. PT Goal Formulation: With patient Time For Goal Achievement: 10/06/13 Potential to Achieve Goals: Good  Visit Information  Last PT Received On: 09/25/13 Assistance Needed: +1 History of Present Illness: 79 y.o. male with PMHx of COPD, recurrent UTIs and recent treatment for cellulitis who had called the EMS reportedly for shortness of breath. However it is of note the patient denies ever having shortness of breath. His main complaint was of not feeling well overall with some pain in his lower extremities    Subjective Data  Subjective: My neck hurts so bad Patient Stated Goal: pt did not state.   Cognition  Cognition Arousal/Alertness: Awake/alert Behavior During Therapy: Flat affect Overall Cognitive Status: Difficult to assess    Balance  Balance Balance Assessed: Yes Static Sitting Balance Static Sitting - Balance Support: Left upper extremity supported;Right upper extremity supported Static Sitting - Level of Assistance: 5: Stand by assistance Static Standing Balance Static Standing - Balance Support: Bilateral upper extremity supported Static Standing - Level of Assistance: 4: Min assist Static Standing - Comment/# of Minutes: performed x 3 for approximately 1 minute each time  End of Session PT - End of Session Equipment Utilized During Treatment: Gait belt Activity Tolerance: Patient limited by fatigue;Patient limited by pain Patient left: in  chair;with family/visitor present Nurse Communication: Mobility status   GP     [name] 09/25/2013, 3:19  PM [name], PT DPT  [phone]

## 2013-09-25 NOTE — Evaluation (Signed)
Clinical/Bedside Swallow Evaluation Patient Details  Name: [name] MRN: [id number] Date of Birth: 1928/06/22  Today's Date: 09/25/2013 Time: 1630-1700 SLP Time Calculation (min): 30 min  Past Medical History:  Past Medical History  Diagnosis Date  . Hypertension   . Arthritis   . Ex-smoker   . Rheumatoid arthritis(714.0)   . Pulmonary nodule   . DDD (degenerative disc disease)   . Complication of anesthesia     " I am difficult to wake up "  . Chronic kidney disease     acute 2013, 2014 on chronic stage 3.  . Anemia   . Proteus septicemia 02/2013  . Barrett's esophagus 04/2006    egd by dr Virginia Rochester.   . Colon polyp 04/2006    Hyperplastic on colonoscopy by Dr Virginia Rochester.   . Encephalopathy, metabolic 02/2013   Past Surgical History:  Past Surgical History  Procedure Laterality Date  . Joint replacement      bilateral knee replacement  . Hernia repair    . Back surgery      X 2  . Appendectomy     HPI:  [name] is an 79 y.o. male lives at home with his wife, with hx of HTN, arthritis, DDD, rheumatoid arthritis on chronic low dose steroids, CKD III, brought to the ER with pain in the occiput. Wife said it was severe and he was screaming when he touches it. No blurry vx, stiff neck, confusion, photophobia, or diffuse HA. He also has been feeling weak as well. Work up in the ER included a negative head CT, Cr of 2.1, and WBC of 12.2K. He was given dilaudid with improvement of his pain, and was about to be discharged to home by EDP, but family was not comfortable taking him home, as his wife would not be able to get pain meds for him, and was concerned with his pain coming back. Hospitalist was asked to admit him for pain control. Pt underwent MBS on 11/14 recommended Dys 3/nectar.    Assessment / Plan / Recommendation Clinical Impression  Pt with a h/o dysphagia consistently demonstrated delayed throat clearing and immediate cough with cup sips of thin liquids; chin tuck was not  effective at reducing s/s of aspiration. Although evidence of a mild dysphagia are noted with all consistencies, no overt s/s of aspiration were observed with nectar-thick liquids or solid consistencies. Recommend to downgrade to Dys 3 textures and nectar thick liquids with intermittent supervision. SLP to follow for tolerance and assessment for possible upgrade.    Aspiration Risk  Moderate    Diet Recommendation Dysphagia 3 (Mechanical Soft);Nectar-thick liquid   Liquid Administration via: Cup;Straw;No straw Medication Administration: Whole meds with puree Supervision: Patient able to self feed;Staff to assist with self feeding;Intermittent supervision to cue for compensatory strategies Compensations: Slow rate;Small sips/bites Postural Changes and/or Swallow Maneuvers: Seated upright 90 degrees    Other  Recommendations Oral Care Recommendations: Oral care BID   Follow Up Recommendations  Home health SLP    Frequency and Duration min 2x/week  2 weeks   Pertinent Vitals/Pain N/A    SLP Swallow Goals  Please refer to Care Plan   Swallow Study Prior Functional Status       General Date of Onset:  (chronic) HPI: [name] is an 79 y.o. male lives at home with his wife, with hx of HTN, arthritis, DDD, rheumatoid arthritis on chronic low dose steroids, CKD III, brought to the ER with pain in the  occiput. Wife said it was severe and he was screaming when he touches it. No blurry vx, stiff neck, confusion, photophobia, or diffuse HA. He also has been feeling weak as well. Work up in the ER included a negative head CT, Cr of 2.1, and WBC of 12.2K. He was given dilaudid with improvement of his pain, and was about to be discharged to home by EDP, but family was not comfortable taking him home, as his wife would not be able to get pain meds for him, and was concerned with his pain coming back. Hospitalist was asked to admit him for pain control. Pt underwent MBS on 11/14 recommended Dys  3/nectar.  Type of Study: Bedside swallow evaluation Previous Swallow Assessment: MBS 08/07/13 with recommendations for Dys 3 and nectar thick liquids Diet Prior to this Study: Dysphagia 3 (soft);Thin liquids Temperature Spikes Noted: No Respiratory Status: Room air History of Recent Intubation: No Behavior/Cognition: Alert;Cooperative;Pleasant mood Oral Cavity - Dentition: Adequate natural dentition Self-Feeding Abilities: Able to feed self;Needs assist Patient Positioning: Upright in bed Baseline Vocal Quality: Clear Volitional Cough: Weak Volitional Swallow: Able to elicit    Oral/Motor/Sensory Function Overall Oral Motor/Sensory Function: Impaired Labial ROM: Within Functional Limits Labial Symmetry: Within Functional Limits Labial Strength: Reduced Lingual ROM: Within Functional Limits (involuntary movements noted upon protrusion) Lingual Symmetry: Within Functional Limits Lingual Strength: Reduced Facial ROM: Within Functional Limits Facial Symmetry: Within Functional Limits Mandible: Within Functional Limits   Ice Chips Ice chips: Not tested   Thin Liquid Thin Liquid: Impaired Presentation: Cup;Self Fed Pharyngeal  Phase Impairments: Suspected delayed Swallow;Decreased hyoid-laryngeal movement;Throat Clearing - Delayed;Cough - Immediate;Other (comments) (chin tuck not effective in reducing s/s of aspiration)    Nectar Thick Nectar Thick Liquid: Impaired Presentation: Cup;Self Fed;Straw Pharyngeal Phase Impairments: Suspected delayed Swallow;Decreased hyoid-laryngeal movement   Honey Thick Honey Thick Liquid: Not tested   Puree Puree: Impaired Presentation: Self Fed;Spoon Pharyngeal Phase Impairments: Suspected delayed Swallow;Decreased hyoid-laryngeal movement   Solid   GO    Solid: Impaired Presentation: Self Fed Pharyngeal Phase Impairments: Suspected delayed Swallow;Decreased hyoid-laryngeal movement        Maxcine Ham, M.A.  CCC-SLP [phone]  Maxcine Ham 09/25/2013,5:25 PM

## 2013-09-26 DIAGNOSIS — M542 Cervicalgia: Secondary | ICD-10-CM

## 2013-09-26 DIAGNOSIS — R0902 Hypoxemia: Secondary | ICD-10-CM

## 2013-09-26 DIAGNOSIS — Z515 Encounter for palliative care: Secondary | ICD-10-CM

## 2013-09-26 LAB — BASIC METABOLIC PANEL
BUN: 86 mg/dL — ABNORMAL HIGH (ref 6–23)
CO2: 34 mEq/L — ABNORMAL HIGH (ref 19–32)
Creatinine, Ser: 2.96 mg/dL — ABNORMAL HIGH (ref 0.50–1.35)
GFR calc Af Amer: 21 mL/min — ABNORMAL LOW (ref >90)
GFR calc non Af Amer: 18 mL/min — ABNORMAL LOW (ref >90)
Glucose, Bld: 86 mg/dL (ref 70–99)
Potassium: 4.3 mEq/L (ref 3.7–5.3)

## 2013-09-26 MED ORDER — ALBUTEROL SULFATE (2.5 MG/3ML) 0.083% IN NEBU: 2.5000 mg | INHALATION_SOLUTION | RESPIRATORY_TRACT | Status: DC | PRN

## 2013-09-26 NOTE — Progress Notes (Signed)
Physical Therapy Treatment Patient Details Name: [name] MRN: [id number] DOB: 1928-06-08 Today's Date: 09/26/2013 Time: 5621-3086 PT Time Calculation (min): 20 min  PT Assessment / Plan / Recommendation  History of Present Illness 79 y.o. male with PMHx of COPD, recurrent UTIs and recent treatment for cellulitis who had called the EMS reportedly for shortness of breath. However it is of note the patient denies ever having shortness of breath. His main complaint was of not feeling well overall with some pain in his lower extremities   PT Comments   Patient demonstrates improved participation today but is still significantly limited in mobility. Patient unable to exceed approx 12 ft of ambulation without sitting. As patient fatigues he becomes a heavy assist to prevent fall as bilaterl LEs start to remain in flexion due to weakness. Will need SNF.  Follow Up Recommendations  SNF           Equipment Recommendations  None recommended by PT       Frequency Min 3X/week   Progress towards PT Goals Progress towards PT goals: Progressing toward goals  Plan Current plan remains appropriate    Precautions / Restrictions Precautions Precautions: Fall Restrictions Weight Bearing Restrictions: No   Pertinent Vitals/Pain Patient did not complain of pain until end of session, then stated some neck pain, requested pain medication    Mobility  Bed Mobility Bed Mobility: Supine to Sit;Sitting - Scoot to Edge of Bed Supine to Sit: 4: Min guard Sitting - Scoot to [place] of Bed: 4: Min guard Details for Bed Mobility Assistance: Able to reach EOB without any physical assist today, did require increased time to perform Transfers Transfers: Sit to Stand;Stand to Sit Sit to Stand: 4: Min assist;From elevated surface;With upper extremity assist;From bed;From toilet Stand to Sit: 4: Min assist;With upper extremity assist;To chair/3-in-1;To toilet Details for Transfer Assistance: assist to  elevate to standing position, cues for hand placement Ambulation/Gait Ambulation/Gait Assistance: 4: Min assist Ambulation Distance (Feet): 32 Feet (12 ft, 62ft x 2, seated rest breaks in between) Assistive device: Rolling walker Ambulation/Gait Assistance Details: 3 seated rest breaks with ambulation, as patient became fatigue began to flex LEs and unsafe to progress further Stairs: No Wheelchair Mobility Wheelchair Mobility: No      PT Goals (current goals can now be found in the care plan section) Acute Rehab PT Goals Patient Stated Goal: pt did not state. PT Goal Formulation: With patient Time For Goal Achievement: 10/06/13 Potential to Achieve Goals: Good  Visit Information  Last PT Received On: 09/26/13 Assistance Needed: +1 History of Present Illness: 79 y.o. male with PMHx of COPD, recurrent UTIs and recent treatment for cellulitis who had called the EMS reportedly for shortness of breath. However it is of note the patient denies ever having shortness of breath. His main complaint was of not feeling well overall with some pain in his lower extremities    Subjective Data  Subjective: I didn't sleep well at all Patient Stated Goal: pt did not state.   Cognition  Cognition Arousal/Alertness: Awake/alert Behavior During Therapy: Flat affect Overall Cognitive Status: Difficult to assess General Comments: pt presetns with confusion today, confabulating story about how he didnt sleep well because his daughter had a pistol locked in the closet and he was worried about her, (pointing to the bathroom)    Balance  Balance Balance Assessed: Yes Static Sitting Balance Static Sitting - Balance Support: Left upper extremity supported;Right upper extremity supported Static Sitting - Level of Assistance:  5: Stand by assistance Static Sitting - Comment/# of Minutes: EOB  End of Session PT - End of Session Equipment Utilized During Treatment: Gait belt;Oxygen Activity Tolerance:  Patient limited by fatigue;Patient limited by pain Patient left: in chair;with call bell/phone within reach Nurse Communication: Mobility status   GP     Fabio Asa 09/26/2013, 8:58 AM Charlotte Crumb, PT DPT  [phone]

## 2013-09-26 NOTE — Progress Notes (Signed)
Speech Language Pathology Treatment: Dysphagia  Patient Details Name: [name] MRN: [id number] DOB: 08-18-1928 Today's Date: 09/26/2013 Time: 4098-1191 SLP Time Calculation (min): 12 min  Assessment / Plan / Recommendation Clinical Impression  Pt seen for f/u dysphagia treatment. Pt with increased participation today, reporting that he was feeling better "in some ways." Pt consumed trials od dys 3 textures and thin liquids with no overt s/s of aspiration observed. Intermittent eructation noted. Recommend Dys 3 and thin liquids with brief SLP f/u for diet tolerance.   HPI HPI: [name] is an 79 y.o. male lives at home with his wife, with hx of HTN, arthritis, DDD, rheumatoid arthritis on chronic low dose steroids, CKD III, brought to the ER with pain in the occiput. Wife said it was severe and he was screaming when he touches it. No blurry vx, stiff neck, confusion, photophobia, or diffuse HA. He also has been feeling weak as well. Work up in the ER included a negative head CT, Cr of 2.1, and WBC of 12.2K. He was given dilaudid with improvement of his pain, and was about to be discharged to home by EDP, but family was not comfortable taking him home, as his wife would not be able to get pain meds for him, and was concerned with his pain coming back. Hospitalist was asked to admit him for pain control. Pt underwent MBS on 11/14 recommended Dys 3/nectar.    Pertinent Vitals N/A  SLP Plan  Continue with current plan of care    Recommendations Diet recommendations: Dysphagia 3 (mechanical soft);Thin liquid Liquids provided via: Cup;Straw;No straw Medication Administration: Whole meds with puree Supervision: Patient able to self feed;Staff to assist with self feeding;Intermittent supervision to cue for compensatory strategies Compensations: Slow rate;Small sips/bites Postural Changes and/or Swallow Maneuvers: Seated upright 90 degrees              Oral Care Recommendations: Oral care  BID Follow up Recommendations: None Plan: Continue with current plan of care    GO      Maxcine Ham, M.A. CCC-SLP [phone]  Maxcine Ham 09/26/2013, 3:30 PM

## 2013-09-26 NOTE — Progress Notes (Signed)
[facility] PROGRESS NOTE  [name] WUJ:[id number] DOB: 1928-02-13 DOA: 09/20/2013 PCP: [name], MD  Brief narrative  79 y.o. Male with hx of HTN, arthritis, DDD, rheumatoid arthritis on chronic low dose steroids, CKD III, brought to the ER with pain in the right occiput. Has tenderness to a certain area. No blurry vision, stiff neck, confusion, photophobia, or diffuse HA. He also has been feeling weak as well. Work up in the ER included a negative head CT, Cr of 2.1, and WBC of 12.2K. He was given dilaudid with improvement of his pain, and was about to be discharged to home by EDP, but family was not comfortable taking him home, as his wife would not be able to get pain meds for him, and was concerned with his pain coming back. Hospitalist was asked to admit him for pain control . Hospital course prolonged due to shortness of breath with CHF and COPD.   Assessment/Plan:  Headache  Appears non specific and likely muscle sprain. Head CT negative. ESR mildly elevated.. Continue tylenol. Prn dilaudid for pain . Symptoms not resolved and appears to be related to muscle sprain.  lidoderm patch.   Shortness of breath   Symptoms improving.   Holding lasix as Cr has worsened- will resume soon at a lower dose Recent echo with  normal EF  AKI on CKD stage 3  Baseline creatinine of 1.6-2.  Worsened  on Lasix.  Holding lasix Trend BMP -hold on IVF  Chronic bilateral hydronephrosis  Patient has recurrent UTIs. continue flomax . Follows with urologist as outpt   Deconditioning and severe malnutrition  Was discharged to [facility] last hospitalization. Appears very frail and weak. PT eval  -palliative care following SNF recommended by PT   Rheumatoid arthritis  Continue home dose prednisone   Code Status: full   Family Communication: daughter at bedside  Disposition Plan: pending PT eval recommends SNF. SW consulted.    Consultants:  Palliative care consult   Procedures:   none Antibiotics:  none    HPI/Subjective: Pain tolerable  Objective: Filed Vitals:   09/26/13 1008  BP: 130/73  Pulse: 58  Temp: 97.8 F (36.6 C)  Resp: 18    Intake/Output Summary (Last 24 hours) at 09/26/13 1039 Last data filed at 09/26/13 0805  Gross per 24 hour  Intake      0 ml  Output    700 ml  Net   -700 ml   Filed Weights   09/20/13 2300  Weight: 70.2 kg (154 lb 12.2 oz)    Exam:  General: in chair, no distress  HEENT: No pallor, moist oral mucosa, no JVD,  Chest: clear anterior  CVS: Normal S1 and S2, no murmurs rub or gallop  Abdomen: Soft, nontender, nondistended, bowel sounds present  Extremities: Warm, no edema , chronic skin changes, macular rash  Over the trunk andextremities CNS: AAO x3   Data Reviewed: Basic Metabolic Panel:  Recent Labs Lab 09/22/13 0400 09/23/13 0854 09/24/13 0738 09/25/13 0519 09/26/13 0610  NA 144 139 139 139 141  K 5.1 4.7 4.8 4.7 4.3  CL 97 93* 90* 91* 93*  CO2 39* 39* 37* 38* 34*  GLUCOSE 112* 94 75 128* 86  BUN 46* 61* 71* 94* 86*  CREATININE 2.46* 2.81* 2.94* 3.42* 2.96*  CALCIUM 9.6 9.3 9.3 8.5 8.0*   Liver Function Tests:  Recent Labs Lab 09/20/13 1637  AST 14  ALT 22  ALKPHOS 75  BILITOT 0.5  PROT 5.9*  ALBUMIN 2.9*   No results found for this basename: LIPASE, AMYLASE,  in the last 168 hours No results found for this basename: AMMONIA,  in the last 168 hours CBC:  Recent Labs Lab 09/20/13 1637  WBC 12.2*  NEUTROABS 9.8*  HGB 10.3*  HCT 34.2*  MCV 95.0  PLT 127*   Cardiac Enzymes: No results found for this basename: CKTOTAL, CKMB, CKMBINDEX, TROPONINI,  in the last 168 hours BNP (last 3 results)  Recent Labs  08/02/13 1740 08/18/13 0625 09/20/13 1637  PROBNP 7170.0* 1018.0* 1238.0*   CBG: No results found for this basename: GLUCAP,  in the last 168 hours  No results found for this or any previous visit (from the past 240 hour(s)).   Studies: No results  found.  Scheduled Meds: . acidophilus  1 capsule Oral BID  . aspirin EC  81 mg Oral q morning - 10a  . cholecalciferol  2,000 Units Oral q morning - 10a  . docusate sodium  100 mg Oral BID  . escitalopram  10 mg Oral Daily  . gabapentin  300 mg Oral QHS  . heparin  5,000 Units Subcutaneous Q8H  . lidocaine  1 patch Transdermal Q24H  . loratadine  10 mg Oral Daily  . metoprolol tartrate  25 mg Oral BID  . pantoprazole  40 mg Oral Daily  . predniSONE  20 mg Oral BID WC  . tamsulosin  0.4 mg Oral QPC supper   Continuous Infusions:    Time spent: 25 minutes    [name]  [facility] Pager [phone] If 7PM-7AM, please contact night-coverage at [email or web address], password [facility] 09/26/2013, 10:39 AM  LOS: 6 days

## 2013-09-26 NOTE — Progress Notes (Signed)
[name]      DOB: Jun 06, 1928      NGE:[id number]  [name].  He is currently enrolled in their  [facility] program and was scheduled to have a home visit tomorrow.  Patient is likely hospice eligible based on his renal disease and heart disease.  I spoke with Dr. Benjamine Mola.  He is not ready for discharge at this time due to decompensated HF.  I will follow up with his daughter in the am to reinforce Hospice services which the [place] thinks is a good idea.  Pain seemed better today per Dr. Benjamine Mola.  Will revisit patient and try to help coordinate discharge in the am.  [name] L. Ladona Ridgel, MD MBA The Palliative Medicine Team at [facility] Phone: [phone] Pager: [phone]

## 2013-09-27 LAB — BASIC METABOLIC PANEL
BUN: 82 mg/dL — ABNORMAL HIGH (ref 6–23)
CO2: 33 mEq/L — ABNORMAL HIGH (ref 19–32)
Calcium: 8.2 mg/dL — ABNORMAL LOW (ref 8.4–10.5)
Chloride: 99 mEq/L (ref 96–112)
Creatinine, Ser: 3 mg/dL — ABNORMAL HIGH (ref 0.50–1.35)
GFR calc Af Amer: 21 mL/min — ABNORMAL LOW (ref >90)
Glucose, Bld: 100 mg/dL — ABNORMAL HIGH (ref 70–99)
Potassium: 4.9 mEq/L (ref 3.7–5.3)

## 2013-09-27 MED ORDER — HYDROMORPHONE HCL PF 1 MG/ML IJ SOLN
0.5000 mg | INTRAMUSCULAR | Status: DC | PRN
  Administered 2013-09-27 – 2013-10-01 (×6): 0.5 mg via INTRAVENOUS
  Filled 2013-09-27 (×7): qty 1

## 2013-09-27 NOTE — Progress Notes (Signed)
Speech Language Pathology Treatment: Dysphagia  Patient Details Name: [name] MRN: [id number] DOB: [birth year] Today's Date: 09/27/2013 Time: 4098-1191 SLP Time Calculation (min): 16 min  Assessment / Plan / Recommendation Clinical Impression  Pt seen for f/u dysphagia treatment with focus on diet tolerance and utilization of compensatory strategies. Pt consumed thin liquids via straw sips and dys 3 textures with delayed cough x1 with large straw sip of water. SLP provided Min cues for use of strategies. Pt reported that his swallowing was the "least of his concerns right now" and his daughter reported that she has not seen any overt difficulty. Given h/o dysphagia, will follow up one additional time for tolerance and education.   HPI HPI: [name] is an 79 y.o. male lives at home with his wife, with hx of HTN, arthritis, DDD, rheumatoid arthritis on chronic low dose steroids, CKD III, brought to the ER with pain in the occiput. Wife said it was severe and he was screaming when he touches it. No blurry vx, stiff neck, confusion, photophobia, or diffuse HA. He also has been feeling weak as well. Work up in the ER included a negative head CT, Cr of 2.1, and WBC of 12.2K. He was given dilaudid with improvement of his pain, and was about to be discharged to home by EDP, but family was not comfortable taking him home, as his wife would not be able to get pain meds for him, and was concerned with his pain coming back. Hospitalist was asked to admit him for pain control. Pt underwent MBS on 11/14 recommended Dys 3/nectar.    Pertinent Vitals N/A  SLP Plan  Continue with current plan of care    Recommendations Diet recommendations: Dysphagia 3 (mechanical soft);Thin liquid Liquids provided via: Cup;Straw;No straw Medication Administration: Whole meds with puree Supervision: Patient able to self feed;Staff to assist with self feeding;Intermittent supervision to cue for compensatory  strategies Compensations: Slow rate;Small sips/bites Postural Changes and/or Swallow Maneuvers: Seated upright 90 degrees              Oral Care Recommendations: Oral care BID Follow up Recommendations: None Plan: Continue with current plan of care    GO      Maxcine Ham, M.A. CCC-SLP [phone]  Maxcine Ham 09/27/2013, 4:18 PM

## 2013-09-27 NOTE — Progress Notes (Signed)
[facility] PROGRESS NOTE  [name] ZOX:[id number] DOB: [birth year] DOA: 09/20/2013 PCP: [name], MD  Brief narrative  79 y.o. Male with hx of HTN, arthritis, DDD, rheumatoid arthritis on chronic low dose steroids, CKD III, brought to the ER with pain in the right occiput. Has tenderness to a certain area. No blurry vision, stiff neck, confusion, photophobia, or diffuse HA. He also has been feeling weak as well. Work up in the ER included a negative head CT, Cr of 2.1, and WBC of 12.2K. He was given dilaudid with improvement of his pain, and was about to be discharged to home by EDP, but family was not comfortable taking him home, as his wife would not be able to get pain meds for him, and was concerned with his pain coming back. Hospitalist was asked to admit him for pain control . Hospital course prolonged due to shortness of breath with CHF and COPD.   Assessment/Plan:  Headache  Appears non specific and likely muscle sprain. Head CT negative. ESR mildly elevated.. Continue tylenol. Prn dilaudid for pain . Symptoms not resolved and appears to be related to muscle sprain.  lidoderm patch.   Shortness of breath   Symptoms improving.   Holding lasix as Cr has worsened- will resume soon at a lower dose once Cr stabilizes-expect Cr baseline is higher Recent echo with  normal EF  AKI on CKD stage 3  Baseline creatinine of 1.6-2.  Worsened  on Lasix.  Holding lasix Trend BMP  Chronic bilateral hydronephrosis  Patient has recurrent UTIs. continue flomax . Follows with urologist as outpt   Deconditioning and severe malnutrition  Was discharged to [facility] last hospitalization. Appears very frail and weak. PT eval  -palliative care following SNF recommended by PT   Rheumatoid arthritis  Continue home dose prednisone   Code Status: full   Family Communication: daughter on phone  Disposition Plan: pending PT eval recommends SNF. SW consulted.    Consultants:  Palliative  care   Procedures:  none Antibiotics:  none    HPI/Subjective: Pain tolerable No SOB- breathing fine  Objective: Filed Vitals:   09/27/13 0859  BP: 128/69  Pulse: 61  Temp: 98.2 F (36.8 C)  Resp: 18   No intake or output data in the 24 hours ending 09/27/13 1234 Filed Weights   09/20/13 2300  Weight: 70.2 kg (154 lb 12.2 oz)    Exam:  General: in chair, no distress  HEENT: No pallor, moist oral mucosa, no JVD,  Chest: clear anterior  CVS: Normal S1 and S2, no murmurs rub or gallop  Abdomen: Soft, nontender, nondistended, bowel sounds present  Extremities: Warm, no edema , chronic skin changes, macular rash  Over the trunk andextremities CNS: AAO x3   Data Reviewed: Basic Metabolic Panel:  Recent Labs Lab 09/23/13 0854 09/24/13 0738 09/25/13 0519 09/26/13 0610 09/27/13 0714  NA 139 139 139 141 144  K 4.7 4.8 4.7 4.3 4.9  CL 93* 90* 91* 93* 99  CO2 39* 37* 38* 34* 33*  GLUCOSE 94 75 128* 86 100*  BUN 61* 71* 94* 86* 82*  CREATININE 2.81* 2.94* 3.42* 2.96* 3.00*  CALCIUM 9.3 9.3 8.5 8.0* 8.2*   Liver Function Tests:  Recent Labs Lab 09/20/13 1637  AST 14  ALT 22  ALKPHOS 75  BILITOT 0.5  PROT 5.9*  ALBUMIN 2.9*   No results found for this basename: LIPASE, AMYLASE,  in the last 168 hours No results found for this basename: AMMONIA,  in the last 168 hours CBC:  Recent Labs Lab 09/20/13 1637  WBC 12.2*  NEUTROABS 9.8*  HGB 10.3*  HCT 34.2*  MCV 95.0  PLT 127*   Cardiac Enzymes: No results found for this basename: CKTOTAL, CKMB, CKMBINDEX, TROPONINI,  in the last 168 hours BNP (last 3 results)  Recent Labs  08/02/13 1740 08/18/13 0625 09/20/13 1637  PROBNP 7170.0* 1018.0* 1238.0*   CBG: No results found for this basename: GLUCAP,  in the last 168 hours  No results found for this or any previous visit (from the past 240 hour(s)).   Studies: No results found.  Scheduled Meds: . acidophilus  1 capsule Oral BID  . aspirin  EC  81 mg Oral q morning - 10a  . cholecalciferol  2,000 Units Oral q morning - 10a  . docusate sodium  100 mg Oral BID  . escitalopram  10 mg Oral Daily  . gabapentin  300 mg Oral QHS  . heparin  5,000 Units Subcutaneous Q8H  . lidocaine  1 patch Transdermal Q24H  . loratadine  10 mg Oral Daily  . metoprolol tartrate  25 mg Oral BID  . pantoprazole  40 mg Oral Daily  . tamsulosin  0.4 mg Oral QPC supper   Continuous Infusions:    Time spent: 25 minutes    Benjamine Mola JESSICA  [facility] Pager [phone] If 7PM-7AM, please contact night-coverage at [email or web address], password [facility] 09/27/2013, 12:34 PM  LOS: 7 days

## 2013-09-27 NOTE — Progress Notes (Signed)
[name]      DOB: 1928-03-01      NGE:[id number]   Palliative Medicine Team at [facility] Progress Note  Late Entry for 12/30  Subjective: Stopped to see patient.  No family at the bedside.  He was up in the chair and needed to go to the rest room.  Patient stated at that time pain was ok. Now with pain on skin just under right clavicle whereas yesterday was trapezius and central cervical neck pain.  Soft collar helped some. Will update daughter.     Filed Vitals:   09/27/13 0859  BP: 128/69  Pulse: 61  Temp: 98.2 F (36.8 C)  Resp: 18   Physical exam:    General: no acute distress until one touches skin over right shoulder very light touch causes out of proportion pain.  No rash noted.  Lidocaine patch over trapezius on the right.  No pain with range of motion only with touch Chest decreased less rhonchi CVS: regular, S1, S2 Abd: soft not tender Ext: swollen 1+ Neuro: awake alert and oriented to self and general place    CRT: 2.96 down from 3.42  Assessment and plan:  79 yr old white male admitted with neck pain.  History of CHF, CKD.  Course complicated by worsening heart failure and CKD.  He recently was released for ICu to [facility] and now home but still not doing well.  I spoke with patient's NP at [facility] they will be continuing to work with he and his family long term to get services.  They will likely make a referral to hospice.  [name] is not sure she wants hospice, but she is open to further hospice education.  I believe with comorbid renal and cardiac condition the he likely qualifies for six months or less.  1.  Pain: continue with preferentially the Tramadol, the lidocaine and support with collar if helpful.  He is already on prednisone.  Psycho social support may also help.  2.  CKD:  Being followed by primary team  3.  CHF: improved  4. Dysphagia: speech eval reviewed. Agree with modifications  5.  Hospice can provide education at their leisure with  the support of the [place].  Patient will not be discharging to home with hospice at this time.   Total time:  15 min  Breeanna Galgano L. Ladona Ridgel, MD MBA The Palliative Medicine Team at [facility] Phone: [phone] Pager: [phone]

## 2013-09-27 NOTE — Progress Notes (Signed)
Patient WU:JWJXBJ SOUA CALTAGIRONE      DOB: 11-15-1927      YNW:[id number]  Was able to update Dtr [name] on [facility] with [facility].  Updated SW on options and gave contact information regarding patient's NP at the [place].  Hospice updated on education opportunity at their timing.  No hurry as family not actively considering that right now.  Patient may not have SNF days so SW going to ring [place] to collaborate.  PMT will reevaluate in am for pain control.     Martena Emanuele L. Ladona Ridgel, MD MBA The Palliative Medicine Team at [facility] Phone: [phone] Pager: [phone]

## 2013-09-27 NOTE — Progress Notes (Signed)
Clinical Social Work Department BRIEF PSYCHOSOCIAL ASSESSMENT 09/27/2013  Patient:  [name], [name]     Account Number:  [id number]     Admit date:  09/20/2013  Clinical Social Worker:  Leron Croak, CLINICAL SOCIAL WORKER  Date/Time:  09/27/2013 11:09 AM  Referred by:  Physician  Date Referred:  09/27/2013 Referred for  SNF Placement   Other Referral:   Interview type:  Family Other interview type:   CSW spoke with HCPOA:  Daughter:  [name]  [phone]    PSYCHOSOCIAL DATA Living Status:  FAMILY Admitted from facility:   Level of care:   Primary support name:  [name]  [phone] Primary support relationship to patient:  CHILD, ADULT Degree of support available:   Pt has some support from daughters and and PACT program with the [employer], however the daughters work and are unable to care for the Pt at home.    CURRENT CONCERNS Current Concerns  Post-Acute Placement   Other Concerns:    SOCIAL WORK ASSESSMENT / PLAN CSW spoke with [name] (dtr [phone]) via phone for d/c planning. CSW introduced self and confirmed that daughter is the HCPOA. Ms. Dudenhoeffer stated that she is the [facility] and that she is aware that the Pt is in need for SNF placement at this time. The daughter stated that she believes the Pt has used all of his 100 days and that he has not been well enough for 60 days in order for those days to start over. Daughter stated that the Pt does receive some services from the [facility] PACT program, however they were not able to supply HHPT/Nurse for the amount of hours needed for the daughter to work (8 hours). Daughter stated that she has already taken out additional credit cards to assist with the puirchase of some of his medications and that the [facility] is trying to assist with some of the mediations also. Pt daughter was able to speak with Dr. Judie Petit. Ladona Ridgel concerning [facility] and MD feels that the Pt could qualify for Hospice services. Dr. Ladona Ridgel also  informed CSW that [facility] has  [facility] and [facility] are [facility] increasing the Pt's assistance at home. CSW attempted to contact Ms. Scott and was unable to at this time. CSW will continue to try and work with the [place] for d/c planning. Daughter will be contacting Medicare about days for SNF available and CSW will contact desired SNF to see if Pt has days.  CSW will continue to work with the Pt and family for d/c planning.   Assessment/plan status:  Information/Referral to [facility] Other assessment/ plan:   Information/referral to community resources:   None needed at this time.    PATIENT'S/FAMILY'S RESPONSE TO PLAN OF CARE: Pt's daughter was appreciative for asssitance.       Leron Croak [facility]  [address];  [phone] Phone: [phone]

## 2013-09-28 NOTE — Progress Notes (Signed)
[facility] PROGRESS NOTE  RAMEL TOBON VOH:[id number] DOB: [birth year] DOA: 09/20/2013 PCP: Tommy Medal, MD  Brief narrative  80 y.o. Male with hx of HTN, arthritis, DDD, rheumatoid arthritis on chronic low dose steroids, CKD III, brought to the ER with pain in the right occiput. Has tenderness to a certain area. No blurry vision, stiff neck, confusion, photophobia, or diffuse HA. He also has been feeling weak as well. Work up in the ER included a negative head CT, Cr of 2.1, and WBC of 12.2K. He was given dilaudid with improvement of his pain, and was about to be discharged to home by EDP, but family was not comfortable taking him home, as his wife would not be able to get pain meds for him, and was concerned with his pain coming back. Hospitalist was asked to admit him for pain control . Hospital course prolonged due to shortness of breath with CHF and COPD.   Assessment/Plan:  Headache  Appears non specific and likely muscle sprain. Head CT negative. ESR mildly elevated.. Continue tylenol. Prn dilaudid for pain . Symptoms not resolved and appears to be related to muscle sprain.  lidoderm patch.   Shortness of breath   Symptoms improving.   Holding lasix as Cr has worsened- will resume soon at a lower dose once Cr stabilizes-expect Cr baseline is higher Recent echo with  normal EF  AKI on CKD stage 3  Baseline creatinine of 1.6-2.  Worsened  on Lasix.  Holding lasix Trend BMP  Chronic bilateral hydronephrosis  Patient has recurrent UTIs. continue flomax . Follows with urologist as outpt   Deconditioning and severe malnutrition  Was discharged to [facility] last hospitalization. Appears very frail and weak. PT eval  -palliative care following SNF recommended by PT   Rheumatoid arthritis  Continue home dose prednisone   Code Status: full   Family Communication: daughter on phone  Disposition Plan:  SW consulted.    Consultants:  Palliative care   Procedures:   none Antibiotics:  none    HPI/Subjective: Pain tolerable No SOB- breathing fine  Objective: Filed Vitals:   09/28/13 0629  BP: 112/60  Pulse: 112  Temp: 98 F (36.7 C)  Resp: 16    Intake/Output Summary (Last 24 hours) at 09/28/13 1406 Last data filed at 09/28/13 1000  Gross per 24 hour  Intake      0 ml  Output      1 ml  Net     -1 ml   Filed Weights   09/20/13 2300  Weight: 70.2 kg (154 lb 12.2 oz)    Exam:  General: in bed, no distress  HEENT: No pallor, moist oral mucosa, no JVD,  Chest: clear anterior  CVS: Normal S1 and S2, no murmurs rub or gallop  Abdomen: Soft, nontender, nondistended, bowel sounds present  Extremities: Warm, no edema , chronic skin changes CNS: AAO x3   Data Reviewed: Basic Metabolic Panel:  Recent Labs Lab 09/23/13 0854 09/24/13 0738 09/25/13 0519 09/26/13 0610 09/27/13 0714  NA 139 139 139 141 144  K 4.7 4.8 4.7 4.3 4.9  CL 93* 90* 91* 93* 99  CO2 39* 37* 38* 34* 33*  GLUCOSE 94 75 128* 86 100*  BUN 61* 71* 94* 86* 82*  CREATININE 2.81* 2.94* 3.42* 2.96* 3.00*  CALCIUM 9.3 9.3 8.5 8.0* 8.2*   Liver Function Tests: No results found for this basename: AST, ALT, ALKPHOS, BILITOT, PROT, ALBUMIN,  in the last 168 hours No results found for  this basename: LIPASE, AMYLASE,  in the last 168 hours No results found for this basename: AMMONIA,  in the last 168 hours CBC: No results found for this basename: WBC, NEUTROABS, HGB, HCT, MCV, PLT,  in the last 168 hours Cardiac Enzymes: No results found for this basename: CKTOTAL, CKMB, CKMBINDEX, TROPONINI,  in the last 168 hours BNP (last 3 results)  Recent Labs  08/02/13 1740 08/18/13 0625 09/20/13 1637  PROBNP 7170.0* 1018.0* 1238.0*   CBG: No results found for this basename: GLUCAP,  in the last 168 hours  No results found for this or any previous visit (from the past 240 hour(s)).   Studies: No results found.  Scheduled Meds: . acidophilus  1 capsule Oral BID   . aspirin EC  81 mg Oral q morning - 10a  . cholecalciferol  2,000 Units Oral q morning - 10a  . docusate sodium  100 mg Oral BID  . escitalopram  10 mg Oral Daily  . gabapentin  300 mg Oral QHS  . heparin  5,000 Units Subcutaneous Q8H  . lidocaine  1 patch Transdermal Q24H  . loratadine  10 mg Oral Daily  . metoprolol tartrate  25 mg Oral BID  . pantoprazole  40 mg Oral Daily  . tamsulosin  0.4 mg Oral QPC supper   Continuous Infusions:    Time spent: 25 minutes    Eliseo Squires Fontella Shan  [facility] Pager [phone] If 7PM-7AM, please contact night-coverage at [email or web address], password [facility] 09/28/2013, 2:06 PM  LOS: 8 days

## 2013-09-29 DIAGNOSIS — E870 Hyperosmolality and hypernatremia: Secondary | ICD-10-CM

## 2013-09-29 LAB — BASIC METABOLIC PANEL
BUN: 62 mg/dL — ABNORMAL HIGH (ref 6–23)
CHLORIDE: 108 meq/L (ref 96–112)
CO2: 30 meq/L (ref 19–32)
Calcium: 8.7 mg/dL (ref 8.4–10.5)
Creatinine, Ser: 2.41 mg/dL — ABNORMAL HIGH (ref 0.50–1.35)
GFR calc Af Amer: 27 mL/min — ABNORMAL LOW (ref >90)
GFR calc non Af Amer: 23 mL/min — ABNORMAL LOW (ref >90)
GLUCOSE: 80 mg/dL (ref 70–99)
Potassium: 4.8 mEq/L (ref 3.7–5.3)
SODIUM: 148 meq/L — AB (ref 137–147)

## 2013-09-29 LAB — CBC
HCT: 36.3 % — ABNORMAL LOW (ref 39.0–52.0)
Hemoglobin: 11 g/dL — ABNORMAL LOW (ref 13.0–17.0)
MCH: 28.3 pg (ref 26.0–34.0)
MCHC: 30.3 g/dL (ref 30.0–36.0)
MCV: 93.3 fL (ref 78.0–100.0)
Platelets: 161 10*3/uL (ref 150–400)
RBC: 3.89 MIL/uL — AB (ref 4.22–5.81)
RDW: 19.1 % — ABNORMAL HIGH (ref 11.5–15.5)
WBC: 9.1 10*3/uL (ref 4.0–10.5)

## 2013-09-29 MED ORDER — ENSURE COMPLETE PO LIQD
237.0000 mL | Freq: Two times a day (BID) | ORAL | Status: DC
  Administered 2013-09-29 – 2013-10-02 (×6): 237 mL via ORAL

## 2013-09-29 MED ORDER — SODIUM CHLORIDE 0.45 % IV SOLN
INTRAVENOUS | Status: AC
  Administered 2013-09-29: 17:00:00 via INTRAVENOUS

## 2013-09-29 NOTE — Progress Notes (Addendum)
INITIAL NUTRITION ASSESSMENT  DOCUMENTATION CODES Per approved criteria  -Non-severe (moderate) malnutrition in the context of chronic illness   INTERVENTION: Ensure Complete po BID, each supplement provides 350 kcal and 13 grams of protein RD to follow for nutrition care plan  NUTRITION DIAGNOSIS: Inadequate oral intake related to poor appetite as evidenced by patient report  Goal: Pt to meet >/= 90% of their estimated nutrition needs   Monitor:  PO & supplemental intake, weight, labs, I/O's  Reason for Assessment: Malnutrition Screening Tool Report  80 y.o. male  Admitting Dx: Headache  ASSESSMENT: Patient with PMH of HTN, arthritis, DDD, rheumatoid arthritis on chronic low dose steroids, CKD III; brought to ER with pain in the right occiput; negative head CT; Hospitalist asked to admit patient for pain control.  RD unable to obtain much nutrition hx from patient; he reports his appetite is poor and that he's "not a big eater;" patient known to Clinical Nutrition with previous admission; has hx of poor PO intake; also has had a 10% weight loss since July 2104; would benefit from nutrition supplements -- RD to order.  Noted Palliative Care Team consulted for goals of care.  Patient meets criteria for non-severe (moderate) malnutrition in the context of chronic illness as evidenced by < 75% intake of estimated energy requirement for > 1 month and 10% weight loss x 6 months.  Height: Ht Readings from Last 1 Encounters:  09/20/13 5\' 5"  (1.651 m)    Weight: Wt Readings from Last 1 Encounters:  09/20/13 154 lb 12.2 oz (70.2 kg)   Ideal Body Weight: 136 lb  % Ideal Body Weight: 113%  Wt Readings from Last 10 Encounters:  09/20/13 154 lb 12.2 oz (70.2 kg)  08/07/13 150 lb 2.1 oz (68.1 kg)  05/04/13 159 lb 4.8 oz (72.258 kg)  03/03/13 172 lb 12.8 oz (78.382 kg)  03/01/13 161 lb 1.6 oz (73.074 kg)  02/06/13 168 lb 6.4 oz (76.386 kg)  01/06/13 175 lb 1.6 oz (79.425 kg)   06/04/12 176 lb (79.833 kg)  10/13/11 163 lb 2.3 oz (74 kg)  05/15/09 176 lb 4 oz (79.946 kg)    Usual Body Weight: 172 lb -- June 2014  % Usual Body Weight: 89%  BMI:  Body mass index is 25.75 kg/(m^2).  Estimated Nutritional Needs: Kcal: 1700-1900 Protein: 80-90 gm Fluid: 1.7-1.9 L  Skin: Intact   Diet Order: Dysphagia 3, thin liquids  EDUCATION NEEDS: -No education needs identified at this time  Labs:   Recent Labs Lab 09/26/13 0610 09/27/13 0714 09/29/13 0448  NA 141 144 148*  K 4.3 4.9 4.8  CL 93* 99 108  CO2 34* 33* 30  BUN 86* 82* 62*  CREATININE 2.96* 3.00* 2.41*  CALCIUM 8.0* 8.2* 8.7  GLUCOSE 86 100* 80    Scheduled Meds: . acidophilus  1 capsule Oral BID  . aspirin EC  81 mg Oral q morning - 10a  . cholecalciferol  2,000 Units Oral q morning - 10a  . docusate sodium  100 mg Oral BID  . escitalopram  10 mg Oral Daily  . gabapentin  300 mg Oral QHS  . heparin  5,000 Units Subcutaneous Q8H  . lidocaine  1 patch Transdermal Q24H  . loratadine  10 mg Oral Daily  . metoprolol tartrate  25 mg Oral BID  . pantoprazole  40 mg Oral Daily  . tamsulosin  0.4 mg Oral QPC supper    Continuous Infusions:   Past Medical History  Diagnosis Date  . Hypertension   . Arthritis   . Ex-smoker   . Rheumatoid arthritis(714.0)   . Pulmonary nodule   . DDD (degenerative disc disease)   . Complication of anesthesia     " I am difficult to wake up "  . Chronic kidney disease     acute 2013, 2014 on chronic stage 3.  . Anemia   . Proteus septicemia 02/2013  . Barrett's esophagus 04/2006    egd by dr Virginia Rochester.   . Colon polyp 04/2006    Hyperplastic on colonoscopy by Dr Virginia Rochester.   . Encephalopathy, metabolic 02/2013    Past Surgical History  Procedure Laterality Date  . Joint replacement      bilateral knee replacement  . Hernia repair    . Back surgery      X 2  . Appendectomy      Maureen Chatters, RD, LDN Pager #: [phone] After-Hours Pager #:  [phone]

## 2013-09-29 NOTE — Progress Notes (Signed)
PT Cancellation Note  Patient Details Name: [name] MRN: [id number] DOB: [birth year]   Cancelled Treatment:    Reason Eval/Treat Not Completed: Patient declined, no reason specified, patient states that he does not want to do anything right now. States he just wants to sleep. Will try back later if time permits.   Fabio Asa 09/29/2013, 9:06 AM Charlotte Crumb, PT DPT  [phone]

## 2013-09-29 NOTE — Progress Notes (Signed)
[facility] PROGRESS NOTE  [name] QHU:[id number] DOB: [birth year] DOA: 09/20/2013 PCP: Tommy Medal, MD  Brief narrative  80 y.o. Male with hx of HTN, arthritis, DDD, rheumatoid arthritis on chronic low dose steroids, CKD III, brought to the ER with pain in the right occiput. Has tenderness to a certain area. No blurry vision, stiff neck, confusion, photophobia, or diffuse HA. He also has been feeling weak as well. Work up in the ER included a negative head CT, Cr of 2.1, and WBC of 12.2K. He was given dilaudid with improvement of his pain, and was about to be discharged to home by EDP, but family was not comfortable taking him home, as his wife would not be able to get pain meds for him, and was concerned with his pain coming back. Hospitalist was asked to admit him for pain control . Hospital course prolonged due to shortness of breath with CHF and COPD.   Assessment/Plan:  Headache  Appears non specific and likely muscle sprain. Head CT negative. ESR mildly elevated.. Continue tylenol. Prn dilaudid for pain . Symptoms not resolved and appears to be related to muscle sprain.  lidoderm patch.   Hypernatremia -encourage PO intake and 1/2 NS at slow rate  Shortness of breath   Symptoms improving.   Holding lasix as Cr has worsened- will resume soon at a lower dose once Cr stabilizes-expect Cr baseline is higher Recent echo with  normal EF  AKI on CKD stage 3  Baseline creatinine of 1.6-2.  Worsened  on Lasix.  Holding lasix Trend BMP  Chronic bilateral hydronephrosis  Patient has recurrent UTIs. continue flomax . Follows with urologist as outpt   Deconditioning and severe malnutrition  Was discharged to [facility] last hospitalization. Appears very frail and weak. PT eval  -palliative care following SNF recommended by PT - no SNF days left -home once care giving arranged with family  Rheumatoid arthritis  Continue home dose prednisone   Code Status: full   Family  Communication: daughter on phone  Disposition Plan:  SW consulted home soon   Consultants:  Palliative care   Procedures:  none Antibiotics:  none    HPI/Subjective: Pain tolerable No SOB- breathing fine  Objective: Filed Vitals:   09/29/13 1308  BP: 114/68  Pulse: 77  Temp: 98.8 F (37.1 C)  Resp: 18   No intake or output data in the 24 hours ending 09/29/13 1525 Filed Weights   09/20/13 2300  Weight: 70.2 kg (154 lb 12.2 oz)    Exam:  General: in bed, no distress  HEENT: No pallor, moist oral mucosa, no JVD,  Chest: clear anterior  CVS: Normal S1 and S2, no murmurs rub or gallop  Abdomen: Soft, nontender, nondistended, bowel sounds present  Extremities: Warm, no edema , chronic skin changes CNS: AAO x3   Data Reviewed: Basic Metabolic Panel:  Recent Labs Lab 09/24/13 0738 09/25/13 0519 09/26/13 0610 09/27/13 0714 09/29/13 0448  NA 139 139 141 144 148*  K 4.8 4.7 4.3 4.9 4.8  CL 90* 91* 93* 99 108  CO2 37* 38* 34* 33* 30  GLUCOSE 75 128* 86 100* 80  BUN 71* 94* 86* 82* 62*  CREATININE 2.94* 3.42* 2.96* 3.00* 2.41*  CALCIUM 9.3 8.5 8.0* 8.2* 8.7   Liver Function Tests: No results found for this basename: AST, ALT, ALKPHOS, BILITOT, PROT, ALBUMIN,  in the last 168 hours No results found for this basename: LIPASE, AMYLASE,  in the last 168 hours No results found  for this basename: AMMONIA,  in the last 168 hours CBC:  Recent Labs Lab 09/29/13 0448  WBC 9.1  HGB 11.0*  HCT 36.3*  MCV 93.3  PLT 161   Cardiac Enzymes: No results found for this basename: CKTOTAL, CKMB, CKMBINDEX, TROPONINI,  in the last 168 hours BNP (last 3 results)  Recent Labs  08/02/13 1740 08/18/13 0625 09/20/13 1637  PROBNP 7170.0* 1018.0* 1238.0*   CBG: No results found for this basename: GLUCAP,  in the last 168 hours  No results found for this or any previous visit (from the past 240 hour(s)).   Studies: No results found.  Scheduled Meds: .  acidophilus  1 capsule Oral BID  . aspirin EC  81 mg Oral q morning - 10a  . cholecalciferol  2,000 Units Oral q morning - 10a  . docusate sodium  100 mg Oral BID  . escitalopram  10 mg Oral Daily  . gabapentin  300 mg Oral QHS  . heparin  5,000 Units Subcutaneous Q8H  . lidocaine  1 patch Transdermal Q24H  . loratadine  10 mg Oral Daily  . metoprolol tartrate  25 mg Oral BID  . pantoprazole  40 mg Oral Daily  . tamsulosin  0.4 mg Oral QPC supper   Continuous Infusions: . sodium chloride       Time spent: 25 minutes    Andreya Lacks  [facility] Pager [phone] If 7PM-7AM, please contact night-coverage at [email or web address], password [facility] 09/29/2013, 3:25 PM  LOS: 9 days

## 2013-09-29 NOTE — Progress Notes (Signed)
UR complete.  Navarre Diana RN, MSN 

## 2013-09-29 NOTE — Care Management Note (Unsigned)
Page 1 of 2   10/02/2013     4:19:26 PM   CARE MANAGEMENT NOTE 10/02/2013  Patient:  [name], [name]   Account Number:  [id number]  Date Initiated:  09/25/2013  Documentation initiated by:  Lorne Skeens  Subjective/Objective Assessment:   patient admitted with headache, developed volume overload, shortness of breath due to CHF and COPD. Lives at home with his wife     Action/Plan:   Will follow for discharge needs   Anticipated DC Date:     Anticipated DC Plan:  SKILLED NURSING FACILITY  In-house referral  Clinical Social Worker      DC Planning Services  CM consult      Choice offered to / List presented to:             Status of service:   Medicare Important Message given?   (If response is "NO", the following Medicare IM given date fields will be blank) Date Medicare IM given:   Date Additional Medicare IM given:    Discharge Disposition:    Per UR Regulation:    If discussed at Long Length of Stay Meetings, dates discussed:    Comments:  10/02/13 [place], MSN, CM- Spoke with patient's daughter [name] at length to discuss discharge plans.  Due to lack of skilled Medicare days, patient is being discharged home as he is medically stable for discharge.  Patient's daughter is very frustrated that funds are not available for SNF private pay and private duty care agencies also present a financial hardship. Patient's daughter requested medication assistance, which cannot be provided as patient has insurance.  He is currently recieving his medications from the [place]. Patient's daughter has additional questions regarding Medicare days. CSW was notified and will call daughter to further explain. Clinicals were faxed to Horris Latino, NP at the [facility] as requested.   09/29/13 [place], MSN, CM- met with patient's daughter to further discuss discharge.  Daughter [name] was provided with a private duty list, which had been discussed previously by phone.  CM  explained to daughter that CSW was continuing to pursue information regarding any Medicare SNF days that may be left to arrange discharge based on whether there are any skilled days available.  09/29/13 [place], MSN, [place] with patient's daughter [name] [phone].  Daughter is willing to continue with the planned hospice evaluation, although family is unsure whether they are interested in this option.  Home health discussion with Ms Nicki Reaper was shared with daughter.  CM will meet with daughter later today to discuss private duty options and answer any additional questions.  CSW was updated on conversation and states she will speak with daughter regarding Medicare days.   09/29/13 [place], MSN, CM- Spoke with Horris Latino NP regarding discharge planning.  Ms Nicki Reaper requested that CM speak with patient's daughter [name] regarding scheduled [place] home evaluation and home health needs. At this time, Ms Nicki Reaper is not able to verify the number of hourse/help that will be available at discharge.  This will be determined at a post-discharge interdisciplinary team evaluation.  CM will send reqested discharge information to Ms Nicki Reaper at [phone] when available.   09/29/13 [place], MSN, CM- Voicemail left at [phone] for Horris Latino NP at the [facility] regarding home health services for patient.  Patient is currently recieving services through the [place] covering 4 hrs/day, but will require more assistance at discharge.  Dr Lovena Le with Palliative medicine has previously spoken with Ms. Scott, and CM is following up.  CM was unable to reach anyone through the main number [phone], so cell number was attempted.  Awaiting call back. CM will continue to follow.

## 2013-09-29 NOTE — Progress Notes (Deleted)
CSW contacted [facility] at [facility] to give a referral for [facility] and to see if there are any beds available. CSW also left a message for [name] concerning placement at [facility].   CSW to follow for d/c planning.    [name] [facility]  [address];  [phone] Phone: [phone]

## 2013-09-30 DIAGNOSIS — G894 Chronic pain syndrome: Secondary | ICD-10-CM

## 2013-09-30 DIAGNOSIS — R197 Diarrhea, unspecified: Secondary | ICD-10-CM

## 2013-09-30 LAB — CBC
HEMATOCRIT: 34.4 % — AB (ref 39.0–52.0)
HEMOGLOBIN: 10.7 g/dL — AB (ref 13.0–17.0)
MCH: 29.1 pg (ref 26.0–34.0)
MCHC: 31.1 g/dL (ref 30.0–36.0)
MCV: 93.5 fL (ref 78.0–100.0)
Platelets: 158 10*3/uL (ref 150–400)
RBC: 3.68 MIL/uL — AB (ref 4.22–5.81)
RDW: 19.2 % — ABNORMAL HIGH (ref 11.5–15.5)
WBC: 9.7 10*3/uL (ref 4.0–10.5)

## 2013-09-30 LAB — BASIC METABOLIC PANEL
BUN: 57 mg/dL — ABNORMAL HIGH (ref 6–23)
CALCIUM: 8.7 mg/dL (ref 8.4–10.5)
CHLORIDE: 104 meq/L (ref 96–112)
CO2: 26 meq/L (ref 19–32)
CREATININE: 2.58 mg/dL — AB (ref 0.50–1.35)
GFR calc Af Amer: 25 mL/min — ABNORMAL LOW (ref >90)
GFR calc non Af Amer: 21 mL/min — ABNORMAL LOW (ref >90)
GLUCOSE: 83 mg/dL (ref 70–99)
Potassium: 4.7 mEq/L (ref 3.7–5.3)
Sodium: 143 mEq/L (ref 137–147)

## 2013-09-30 LAB — URINALYSIS, ROUTINE W REFLEX MICROSCOPIC
Bilirubin Urine: NEGATIVE
Glucose, UA: NEGATIVE mg/dL
HGB URINE DIPSTICK: NEGATIVE
KETONES UR: NEGATIVE mg/dL
LEUKOCYTES UA: NEGATIVE
Nitrite: NEGATIVE
PH: 7 (ref 5.0–8.0)
PROTEIN: 30 mg/dL — AB
Specific Gravity, Urine: 1.014 (ref 1.005–1.030)
Urobilinogen, UA: 0.2 mg/dL (ref 0.0–1.0)

## 2013-09-30 LAB — URINE MICROSCOPIC-ADD ON

## 2013-09-30 MED ORDER — DOCUSATE SODIUM 100 MG PO CAPS: 100.0000 mg | ORAL_CAPSULE | Freq: Two times a day (BID) | ORAL | Status: DC | PRN

## 2013-09-30 NOTE — Progress Notes (Signed)
Complaints of burning when voiding. Urine has foul odor. Patient states "My urinary tract infection is back."

## 2013-09-30 NOTE — Progress Notes (Addendum)
[facility] PROGRESS NOTE  [name] LNL:[id number] DOB: [birth year] DOA: 09/20/2013 PCP: Tommy Medal, MD  Brief narrative  80 y.o. Male with hx of HTN, arthritis, DDD, rheumatoid arthritis on chronic low dose steroids, CKD III, brought to the ER with pain in the right occiput. Has tenderness to a certain area. No blurry vision, stiff neck, confusion, photophobia, or diffuse HA. He also has been feeling weak as well. Work up in the ER included a negative head CT, Cr of 2.1, and WBC of 12.2K. He was given dilaudid with improvement of his pain, and was about to be discharged to home by EDP, but family was not comfortable taking him home, as his wife would not be able to get pain meds for him, and was concerned with his pain coming back. Hospitalist was asked to admit him for pain control . Hospital course prolonged due to shortness of breath with CHF and COPD.   Assessment/Plan:  Headache  Appears non specific and likely muscle sprain. Head CT negative. ESR mildly elevated.. Continue tylenol. Prn dilaudid for pain . Symptoms not resolved and appears to be related to muscle sprain.  lidoderm patch.   Diarrhea -monitor -hold colase  Hypernatremia -encourage PO intake and 1/2 NS at slow rate- end time for this PM  Shortness of breath   Symptoms improving.   Holding lasix as Cr has worsened- will resume soon at a lower dose once Cr stabilizes-expect Cr baseline is higher Recent echo with  normal EF  AKI on CKD stage 3  Baseline creatinine of 1.6-2.  Worsened  on Lasix.  Holding lasix Trend BMP  Chronic bilateral hydronephrosis  Patient has recurrent UTIs. continue flomax . Follows with urologist as outpt   Deconditioning and severe malnutrition  Was discharged to [facility] last hospitalization. Appears very frail and weak. PT eval  -palliative care following SNF recommended by PT - no SNF days left -home once care giving arranged with family-  -overall poor  prognosis  Rheumatoid arthritis  Continue home dose prednisone   Code Status: full   Family Communication: daughter on phone  Disposition Plan:  SW consulted home soon   Consultants:  Palliative care   Procedures:  none Antibiotics:  none    HPI/Subjective: Pain tolerable Had diarrhea this AM- holding colase- monitor  Objective: Filed Vitals:   09/30/13 1018  BP: 109/80  Pulse: 67  Temp: 97.9 F (36.6 C)  Resp: 18    Intake/Output Summary (Last 24 hours) at 09/30/13 1105 Last data filed at 09/30/13 0600  Gross per 24 hour  Intake    350 ml  Output    350 ml  Net      0 ml   Filed Weights   09/20/13 2300  Weight: 70.2 kg (154 lb 12.2 oz)    Exam:  General: in bed, no resp distress HEENT: No pallor, moist oral mucosa, no JVD,  Chest: clear anterior  CVS: Normal S1 and S2, no murmurs rub or gallop  Abdomen: Soft, nontender, nondistended, bowel sounds present  Extremities: Warm, no edema , chronic skin changes CNS: AAO x3   Data Reviewed: Basic Metabolic Panel:  Recent Labs Lab 09/25/13 0519 09/26/13 0610 09/27/13 0714 09/29/13 0448 09/30/13 0538  NA 139 141 144 148* 143  K 4.7 4.3 4.9 4.8 4.7  CL 91* 93* 99 108 104  CO2 38* 34* 33* 30 26  GLUCOSE 128* 86 100* 80 83  BUN 94* 86* 82* 62* 57*  CREATININE 3.42* 2.96*  3.00* 2.41* 2.58*  CALCIUM 8.5 8.0* 8.2* 8.7 8.7   Liver Function Tests: No results found for this basename: AST, ALT, ALKPHOS, BILITOT, PROT, ALBUMIN,  in the last 168 hours No results found for this basename: LIPASE, AMYLASE,  in the last 168 hours No results found for this basename: AMMONIA,  in the last 168 hours CBC:  Recent Labs Lab 09/29/13 0448 09/30/13 0538  WBC 9.1 9.7  HGB 11.0* 10.7*  HCT 36.3* 34.4*  MCV 93.3 93.5  PLT 161 158   Cardiac Enzymes: No results found for this basename: CKTOTAL, CKMB, CKMBINDEX, TROPONINI,  in the last 168 hours BNP (last 3 results)  Recent Labs  08/02/13 1740  08/18/13 0625 09/20/13 1637  PROBNP 7170.0* 1018.0* 1238.0*   CBG: No results found for this basename: GLUCAP,  in the last 168 hours  No results found for this or any previous visit (from the past 240 hour(s)).   Studies: No results found.  Scheduled Meds: . acidophilus  1 capsule Oral BID  . aspirin EC  81 mg Oral q morning - 10a  . cholecalciferol  2,000 Units Oral q morning - 10a  . escitalopram  10 mg Oral Daily  . feeding supplement (ENSURE COMPLETE)  237 mL Oral BID BM  . gabapentin  300 mg Oral QHS  . heparin  5,000 Units Subcutaneous Q8H  . lidocaine  1 patch Transdermal Q24H  . loratadine  10 mg Oral Daily  . metoprolol tartrate  25 mg Oral BID  . pantoprazole  40 mg Oral Daily  . tamsulosin  0.4 mg Oral QPC supper   Continuous Infusions: . sodium chloride 50 mL/hr at 09/29/13 1720     Time spent: 25 minutes    Eulogio Bear  [facility] Pager [phone] If 7PM-7AM, please contact night-coverage at [email or web address], password [facility] 09/30/2013, 11:05 AM  LOS: 10 days

## 2013-10-01 MED ORDER — HYDROMORPHONE HCL 2 MG PO TABS
1.0000 mg | ORAL_TABLET | ORAL | Status: DC | PRN
  Administered 2013-10-01 – 2013-10-02 (×5): 1 mg via ORAL
  Filled 2013-10-01 (×5): qty 1

## 2013-10-01 NOTE — Progress Notes (Signed)
[facility] PROGRESS NOTE  BRITTEN SEYFRIED UXL:[id number] DOB: 1928/07/18 DOA: 09/20/2013 PCP: Tommy Medal, MD  Brief narrative  80 y.o. Male with hx of HTN, arthritis, DDD, rheumatoid arthritis on chronic low dose steroids, CKD III, brought to the ER with pain in the right occiput. Has tenderness to a certain area. No blurry vision, stiff neck, confusion, photophobia, or diffuse HA. He also has been feeling weak as well. Work up in the ER included a negative head CT, Cr of 2.1, and WBC of 12.2K. He was given dilaudid with improvement of his pain, and was about to be discharged to home by EDP, but family was not comfortable taking him home, as his wife would not be able to get pain meds for him, and was concerned with his pain coming back. Hospitalist was asked to admit him for pain control . Hospital course prolonged due to shortness of breath with CHF and COPD.   Assessment/Plan:  Headache  Appears non specific and likely muscle sprain. Head CT negative. ESR mildly elevated.. Continue tylenol. Prn dilaudid for pain . Symptoms not resolved and appears to be related to muscle sprain.  lidoderm patch.   Diarrhea -resolved  Hypernatremia -encourage PO intake and 1/2 NS at slow rate- end time for this PM  Shortness of breath   Symptoms improving.   Holding lasix as Cr has worsened- will resume soon at a lower dose once Cr stabilizes-expect Cr baseline is higher Recent echo with  normal EF  AKI on CKD stage 3  Baseline creatinine of 1.6-2.  Worsened  on Lasix.  Holding lasix Trend BMP  Chronic bilateral hydronephrosis  Patient has recurrent UTIs. continue flomax . Follows with urologist as outpt   Deconditioning and severe malnutrition  Was discharged to [facility] last hospitalization. Appears very frail and weak. PT eval  -palliative care following SNF recommended by PT - no SNF days left -home once care giving arranged with family-  -overall poor prognosis  Rheumatoid arthritis   Continue home dose prednisone   Code Status: full   Family Communication: daughter on phone  Disposition Plan:  SW consulted home soon- d/c in AM- spoke with daughter about getting more help at home and impending discharge   Consultants:  Palliative care   Procedures:  none Antibiotics:  none    HPI/Subjective: No SOB, no CP   Objective: Filed Vitals:   10/01/13 0600  BP: 131/66  Pulse: 59  Temp: 97.6 F (36.4 C)  Resp: 20    Intake/Output Summary (Last 24 hours) at 10/01/13 1259 Last data filed at 10/01/13 0600  Gross per 24 hour  Intake      0 ml  Output    400 ml  Net   -400 ml   Filed Weights   09/20/13 2300  Weight: 70.2 kg (154 lb 12.2 oz)    Exam:  General: in bed, no resp distress HEENT: No pallor, moist oral mucosa, no JVD,  Chest: clear anterior  CVS: Normal S1 and S2, no murmurs rub or gallop - tender to palpation Abdomen: Soft, nontender, nondistended, bowel sounds present  Extremities: Warm, no edema , chronic skin changes CNS: AAO x3   Data Reviewed: Basic Metabolic Panel:  Recent Labs Lab 09/25/13 0519 09/26/13 0610 09/27/13 0714 09/29/13 0448 09/30/13 0538  NA 139 141 144 148* 143  K 4.7 4.3 4.9 4.8 4.7  CL 91* 93* 99 108 104  CO2 38* 34* 33* 30 26  GLUCOSE 128* 86 100* 80 83  BUN 94* 86* 82* 62* 57*  CREATININE 3.42* 2.96* 3.00* 2.41* 2.58*  CALCIUM 8.5 8.0* 8.2* 8.7 8.7   Liver Function Tests: No results found for this basename: AST, ALT, ALKPHOS, BILITOT, PROT, ALBUMIN,  in the last 168 hours No results found for this basename: LIPASE, AMYLASE,  in the last 168 hours No results found for this basename: AMMONIA,  in the last 168 hours CBC:  Recent Labs Lab 09/29/13 0448 09/30/13 0538  WBC 9.1 9.7  HGB 11.0* 10.7*  HCT 36.3* 34.4*  MCV 93.3 93.5  PLT 161 158   Cardiac Enzymes: No results found for this basename: CKTOTAL, CKMB, CKMBINDEX, TROPONINI,  in the last 168 hours BNP (last 3 results)  Recent  Labs  08/02/13 1740 08/18/13 0625 09/20/13 1637  PROBNP 7170.0* 1018.0* 1238.0*   CBG: No results found for this basename: GLUCAP,  in the last 168 hours  No results found for this or any previous visit (from the past 240 hour(s)).   Studies: No results found.  Scheduled Meds: . acidophilus  1 capsule Oral BID  . aspirin EC  81 mg Oral q morning - 10a  . cholecalciferol  2,000 Units Oral q morning - 10a  . escitalopram  10 mg Oral Daily  . feeding supplement (ENSURE COMPLETE)  237 mL Oral BID BM  . gabapentin  300 mg Oral QHS  . heparin  5,000 Units Subcutaneous Q8H  . lidocaine  1 patch Transdermal Q24H  . loratadine  10 mg Oral Daily  . metoprolol tartrate  25 mg Oral BID  . pantoprazole  40 mg Oral Daily  . tamsulosin  0.4 mg Oral QPC supper   Continuous Infusions:     Time spent: 25 minutes    Eliseo Squires Tadd Holtmeyer  [facility] Pager [phone] If 7PM-7AM, please contact night-coverage at [email or web address], password [facility] 10/01/2013, 12:59 PM  LOS: 11 days

## 2013-10-02 DIAGNOSIS — N183 Chronic kidney disease, stage 3 unspecified: Secondary | ICD-10-CM | POA: Diagnosis present

## 2013-10-02 LAB — CBC
HCT: 33.6 % — ABNORMAL LOW (ref 39.0–52.0)
HEMOGLOBIN: 10.6 g/dL — AB (ref 13.0–17.0)
MCH: 29.3 pg (ref 26.0–34.0)
MCHC: 31.5 g/dL (ref 30.0–36.0)
MCV: 92.8 fL (ref 78.0–100.0)
Platelets: 141 10*3/uL — ABNORMAL LOW (ref 150–400)
RBC: 3.62 MIL/uL — ABNORMAL LOW (ref 4.22–5.81)
RDW: 18.5 % — ABNORMAL HIGH (ref 11.5–15.5)
WBC: 8.4 10*3/uL (ref 4.0–10.5)

## 2013-10-02 LAB — BASIC METABOLIC PANEL
BUN: 43 mg/dL — ABNORMAL HIGH (ref 6–23)
CALCIUM: 9.1 mg/dL (ref 8.4–10.5)
CO2: 26 mEq/L (ref 19–32)
Chloride: 106 mEq/L (ref 96–112)
Creatinine, Ser: 2.29 mg/dL — ABNORMAL HIGH (ref 0.50–1.35)
GFR calc Af Amer: 29 mL/min — ABNORMAL LOW (ref >90)
GFR, EST NON AFRICAN AMERICAN: 25 mL/min — AB (ref >90)
Glucose, Bld: 82 mg/dL (ref 70–99)
Potassium: 4.8 mEq/L (ref 3.7–5.3)
SODIUM: 143 meq/L (ref 137–147)

## 2013-10-02 MED ORDER — TRAMADOL HCL 50 MG PO TABS: 50.0000 mg | ORAL_TABLET | Freq: Four times a day (QID) | ORAL | Status: DC | PRN

## 2013-10-02 MED ORDER — HYDROMORPHONE HCL 2 MG PO TABS: 1.0000 mg | ORAL_TABLET | Freq: Three times a day (TID) | ORAL | Status: DC | PRN

## 2013-10-02 MED ORDER — LIDOCAINE 5 % EX PTCH: 1.0000 | MEDICATED_PATCH | CUTANEOUS | Status: DC

## 2013-10-02 MED ORDER — ENSURE COMPLETE PO LIQD: 237.0000 mL | Freq: Two times a day (BID) | ORAL | Status: DC

## 2013-10-02 NOTE — Discharge Instructions (Signed)
Heart Failure Heart failure means your heart has trouble pumping blood. This makes it hard for your body to work well. Heart failure is usually a long-term (chronic) condition. You must take good care of yourself and follow your doctor's treatment plan. HOME CARE  Take your heart medicine as told by your doctor.  Do not stop taking medicine unless your doctor tells you to.  Do not skip any dose of medicine.  Refill your medicines before they run out.  Take other medicines only as told by your doctor or pharmacist.  Stay active if told by your doctor. The elderly and people with severe heart failure should talk with a doctor about physical activity.  Eat heart healthy foods. Choose foods that are without trans fat and are low in saturated fat, cholesterol, and salt (sodium). This includes fresh or frozen fruits and vegetables, fish, lean meats, fat-free or low-fat dairy foods, whole grains, and high-fiber foods. Lentils and dried peas and beans (legumes) are also good choices.  Limit salt if told by your doctor.  Cook in a healthy way. Roast, grill, broil, bake, poach, steam, or stir-fry foods.  Limit fluids as told by your doctor.  Weigh yourself every morning. Do this after you pee (urinate) and before you eat breakfast. Write down your weight to give to your doctor.  Take your blood pressure and write it down if your doctor tell you to.  Ask your doctor how to check your pulse. Check your pulse as told.  Lose weight if told by your doctor.  Stop smoking or chewing tobacco. Do not use gum or patches that help you quit without your doctor's approval.  Schedule and go to doctor visits as told.  Nonpregnant women should have no more than 1 drink a day. Men should have no more than 2 drinks a day. Talk to your doctor about drinking alcohol.  Stop illegal drug use.  Stay current with shots (immunizations).  Manage your health conditions as told by your doctor.  Learn to manage  your stress.  Rest when you are tired.  If it is really hot outside:  Avoid intense activities.  Use air conditioning or fans, or get in a cooler place.  Avoid caffeine and alcohol.  Wear loose-fitting, lightweight, and light-colored clothing.  If it is really cold outside:  Avoid intense activities.  Layer your clothing.  Wear mittens or gloves, a hat, and a scarf when going outside.  Avoid alcohol.  Learn about heart failure and get support as needed.  Get help to maintain or improve your quality of life and your ability to care for yourself as needed. GET HELP IF:   You gain 03 lb/1.4 kg or more in 1 day or 05 lb/2.3 kg in a week.  You are more short of breath than usual.  You cannot do your normal activities.  You tire easily.  You cough more than normal, especially with activity.  You have any or more puffiness (swelling) in areas such as your hands, feet, ankles, or belly (abdomen).  You cannot sleep because it is hard to breathe.  You feel like your heart is beating fast (palpitations).  You get dizzy or lightheaded when you stand up. GET HELP RIGHT AWAY IF:   You have trouble breathing.  There is a change in mental status, such as becoming less alert or not being able to focus.  You have chest pain or discomfort.  You faint. MAKE SURE YOU:   Understand these   instructions.  Will watch your condition.  Will get help right away if you are not doing well or get worse. Document Released: 06/23/2008 Document Revised: 01/09/2013 Document Reviewed: 04/14/2012 Exit[employer] Patient Information 2014 [employer].  

## 2013-10-02 NOTE — Discharge Summary (Signed)
Physician Discharge Summary  [name] KZL:[id number] DOB: 1928/05/16 DOA: 09/20/2013  PCP: Tommy Medal, MD  Admit date: 09/20/2013 Discharge date: 10/02/2013  Time spent: 40 minutes  Recommendations for Outpatient Follow-up:  discharge home with [facility]  Follow up [place] as outpt  Discharge Diagnoses:   Principal Problem: CHF exacerbation  Active Problems: Occipital headache   HYPERTENSION   Physical deconditioning Failure to thrive   COPD (chronic obstructive pulmonary disease)   CKD (chronic kidney disease) stage 3, GFR 30-59 ml/min   Acute on chronic kidney disease, stage 3   Discharge Condition: fair  Diet recommendation: dysphagia level 3 , thin liquids  Filed Weights   09/20/13 2300  Weight: 70.2 kg (154 lb 12.2 oz)    History of present illness:  Please refer to admission h&P for details, but in brief, 80 y.o. Male with hx of HTN, arthritis, DDD, rheumatoid arthritis on chronic low dose steroids, CKD III, brought to the ER with pain in the right occiput. Has tenderness to a certain area. No blurry vision, stiff neck, confusion, photophobia, or diffuse HA. He also has been feeling weak as well. Work up in the ER included a negative head CT, Cr of 2.1, and WBC of 12.2K. He was given dilaudid with improvement of his pain, and was about to be discharged to home by EDP, but family was not comfortable taking him home, as his wife would not be able to get pain meds for him, and was concerned with his pain coming back. Hospitalist was asked to admit him for pain control . Hospital course prolonged due to shortness of breath with CHF and COPD.    Hospital Course:  Headache  Appears non specific and likely muscle sprain. Head CT negative. ESR mildly elevated.. Continue tylenol. Prn dilaudid for pain ( will give some prescriptions) . Symptoms appears to be related to muscle sprain. lidoderm patch and prn tramadol.   Diarrhea  -resolved   Hypernatremia  -encourage PO  intake. Improved with 1/2 NS  Shortness of breath  Secondary to CHF exacerbation and COPD. Symptoms improving.  Holding lasix as Cr has worsened. Improved this am. Will resume home dose torsemide.  Recent echo with normal EF . Has diastolic dysfunction. Resume home albuterol nebs  AKI on CKD stage 3  Baseline creatinine of 1.6-2.  Worsened on Lasix.  Held further  lasix . Renal fn better today with creatinine of 2.29. Will resume home dose torsemide. Needs outpt renal function checked.   Chronic bilateral hydronephrosis  Patient has recurrent UTIs. continue flomax . Follows with urologist as outpt   Deconditioning and severe malnutrition  Was discharged to [facility] last hospitalization. Appears very frail and weak. PT eval  -palliative care consulted but daughter wanted to continue full care for ow and not interested in palliative care or hospice at this time.  SNF recommended by PT - no SNF days left so planned to discharge home with home health which has been ordered. Additional home health to be provided by [place] and CM/ [facility]. -patient and daughter informed that his overall condition was declining and will likely have recurrent hospitalization with underlying medical issues in future. .   Rheumatoid arthritis  Continue home dose prednisone   Code Status: full  Family Communication: d/w daughter during hospitalization  Disposition Plan: Home with [facility]   Consultants:  Palliative care    Procedures:  None   Antibiotics:  none  Discharge Exam: Filed Vitals:   10/02/13 1424  BP: 122/65  Pulse: 60  Temp: 97.7 F (36.5 C)  Resp: 20    General: *Elderly frail male in NAD HEENT: no pallor, moist oral mucosa, mild tenderness over right neck area Chest: fine basilar crackles b/l, no added sounds CVS: N S1&S2, no murmurs , rubs or gallop Abd: soft, NT, ND, BS+ Ext: warm, no edema CNS: AAOX3  Discharge Instructions    Future Appointments Provider Department Dept Phone   10/10/2013 1:45 PM Milus Banister, MD [facility] [phone]       Medication List    STOP taking these medications       moxifloxacin 400 MG tablet  Commonly known as:  AVELOX      TAKE these medications       albuterol (2.5 MG/3ML) 0.083% nebulizer solution  Commonly known as:  PROVENTIL  Take 2.5 mg by nebulization 3 (three) times daily. For shortness of breath     alendronate 70 MG tablet  Commonly known as:  FOSAMAX  Take 70 mg by mouth every 7 (seven) days. Take with a full glass of water on an empty stomach.  On Friday     ALIGN 4 MG Caps  Take 1 capsule by mouth 2 (two) times daily.     aspirin EC 81 MG tablet  Take 81 mg by mouth every morning.     camphor-menthol lotion  Commonly known as:  SARNA  Apply 1 application topically daily as needed for itching.     escitalopram 10 MG tablet  Commonly known as:  LEXAPRO  Take 10 mg by mouth daily.     feeding supplement (ENSURE COMPLETE) Liqd  Take 237 mLs by mouth 2 (two) times daily between meals.     gabapentin 300 MG capsule  Commonly known as:  NEURONTIN  Take 300 mg by mouth at bedtime.     hydrocortisone cream 1 %  Apply 1 application topically daily as needed for itching.     HYDROmorphone 2 MG tablet  Commonly known as:  DILAUDID  Take 0.5 tablets (1 mg total) by mouth every 8 (eight) hours as needed for severe pain.     lidocaine 5 %  Commonly known as:  LIDODERM  Place 1 patch onto the skin daily. Remove & Discard patch within 12 hours or as directed by MD     loratadine 10 MG tablet  Commonly known as:  CLARITIN  Take 10 mg by mouth daily.     metoprolol tartrate 25 MG tablet  Commonly known as:  LOPRESSOR  Take 25 mg by mouth 2 (two) times daily.     multivitamin with minerals Tabs tablet  Take 1 tablet by mouth daily.     omeprazole 40 MG capsule  Commonly known as:  PRILOSEC  Take 40 mg by mouth daily.      predniSONE 5 MG tablet  Commonly known as:  DELTASONE  Take 5 mg by mouth daily.     tamsulosin 0.4 MG Caps capsule  Commonly known as:  FLOMAX  Take 1 capsule (0.4 mg total) by mouth daily after supper.     torsemide 20 MG tablet  Commonly known as:  DEMADEX  Take 20 mg by mouth daily.     traMADol 50 MG tablet  Commonly known as:  ULTRAM  Take 1 tablet (50 mg total) by mouth every 6 (six) hours as needed for moderate pain.     trimethoprim  100 MG tablet  Commonly known as:  TRIMPEX  Take 100 mg by mouth daily.     Vitamin D 2000 UNITS tablet  Take 2,000 Units by mouth every morning.       Allergies  Allergen Reactions  . Codeine Other (See Comments)     drives me crazy  . Hydrocodone Other (See Comments)    Makes patient hallucinate, cannot sleep  . Oxycodone Other (See Comments)    Makes patient hallucinate, cannot sleep  . Penicillins Hives  . Sulfa Antibiotics Other (See Comments)    Per MAR       Follow-up Information   Please follow up. (follow [facility] with [facility] in 1 week)        The results of significant diagnostics from this hospitalization (including imaging, microbiology, ancillary and laboratory) are listed below for reference.    Significant Diagnostic Studies: Dg Chest 2 View  09/20/2013   CLINICAL DATA:  Hypertension, COPD  EXAM: CHEST  2 VIEW  COMPARISON:  08/17/2013  FINDINGS: Chronic right hemidiaphragm elevation. Heart is enlarged with vascular and interstitial prominence. Difficult to exclude mild edema. Bibasilar atelectasis present. No definite focal pneumonia. No large effusion or pneumothorax. Trachea is midline.  IMPRESSION: Cardiomegaly with vascular congestion versus early edema  Basilar atelectasis  Chronic right hemidiaphragm elevation   Electronically Signed   By: Daryll Brod M.D.   On: 09/20/2013 17:46   Ct Head Wo Contrast  09/20/2013   CLINICAL DATA:  Pain.  EXAM: CT HEAD WITHOUT CONTRAST  TECHNIQUE: Contiguous axial  images were obtained from the base of the skull through the vertex without intravenous contrast.  COMPARISON:  02/27/2013.  FINDINGS: No mass. No hydrocephalus. No hemorrhage. No acute bony abnormality. Visualized paranasal sinuses are clear. Small amount of fluid noted in the left mastoid air cells.  IMPRESSION: 1. Findings suggesting chronic white matter ischemia. Head CT is stable from prior exam. 2. Minimal amount of fluid in the left mastoid air cells.   Electronically Signed   By: Marcello Moores  Register   On: 09/20/2013 17:09   Ct Cervical Spine Wo Contrast  09/20/2013   CLINICAL DATA:  Right posterior head pain, epistaxis, rash  EXAM: CT CERVICAL SPINE WITHOUT CONTRAST  TECHNIQUE: Multidetector CT imaging of the cervical spine was performed without intravenous contrast. Multiplanar CT image reconstructions were also generated. Accompanying CT images of the head of the previously interpreted, please refer to that report.  COMPARISON:  None; correlation made with PET-CT 04/29/2009  FINDINGS: Small amount of fluid within left mastoid air cells.  Bones appear diffusely demineralized.  Prevertebral soft tissues normal thickness.  Skullbase intact.  Marked disc space narrowing with endplate spur formation at C5-C6.  Additional disc space narrowing and spurring identified at C6-C7 and T2-T3.  Minimal retrolisthesis at C5-C6 with 3.7 mm of anterolisthesis at C6-C7.  Minimal superior endplate compression fracture at T3.  Remaining vertebral demonstrate normal height without additional fracture or subluxation.  Multilevel facet degenerative changes.  Question bone islands in C6 vertebral body and spinous process of C4.  Prior median sternotomy.  Scattered carotid atherosclerotic calcifications.  Small nodular foci at the lung apices uncertain etiology.  IMPRESSION: Multilevel degenerative disc and facet disease changes of the cervical spine as above.  No definite acute cervical spine abnormalities.  Fluid within left  mastoid air cells.  Numerous nodular foci at the lung apices, some present since 2010 but suspect increased in number, recommend followup CT imaging of the chest  for further evaluation.   Electronically Signed   By: Lavonia Dana M.D.   On: 09/20/2013 17:19    Microbiology: Recent Results (from the past 240 hour(s))  URINE CULTURE     Status: None   Collection Time    09/30/13 12:05 PM      Result Value Range Status   Specimen Description URINE, CATHETERIZED   Final   Special Requests NONE   Final   Culture  Setup Time     Final   Value: 09/30/2013 19:33     Performed at [facility]     Final   Value: >=100,000 COLONIES/ML     Performed at [facility]   Culture     Final   Value: PROTEUS MIRABILIS     Performed at [facility]   Report Status PENDING   Incomplete     Labs: Basic Metabolic Panel:  Recent Labs Lab 09/26/13 0610 09/27/13 0714 09/29/13 0448 09/30/13 0538 10/02/13 0333  NA 141 144 148* 143 143  K 4.3 4.9 4.8 4.7 4.8  CL 93* 99 108 104 106  CO2 34* 33* 30 26 26   GLUCOSE 86 100* 80 83 82  BUN 86* 82* 62* 57* 43*  CREATININE 2.96* 3.00* 2.41* 2.58* 2.29*  CALCIUM 8.0* 8.2* 8.7 8.7 9.1   Liver Function Tests: No results found for this basename: AST, ALT, ALKPHOS, BILITOT, PROT, ALBUMIN,  in the last 168 hours No results found for this basename: LIPASE, AMYLASE,  in the last 168 hours No results found for this basename: AMMONIA,  in the last 168 hours CBC:  Recent Labs Lab 09/29/13 0448 09/30/13 0538 10/02/13 0333  WBC 9.1 9.7 8.4  HGB 11.0* 10.7* 10.6*  HCT 36.3* 34.4* 33.6*  MCV 93.3 93.5 92.8  PLT 161 158 141*   Cardiac Enzymes: No results found for this basename: CKTOTAL, CKMB, CKMBINDEX, TROPONINI,  in the last 168 hours BNP: BNP (last 3 results)  Recent Labs  08/02/13 1740 08/18/13 0625 09/20/13 1637  PROBNP 7170.0* 1018.0* 1238.0*   CBG: No results found for this basename: GLUCAP,  in the last  168 hours     Signed:  Renise Gillies  [facility] 10/02/2013, 2:26 PM

## 2013-10-02 NOTE — Progress Notes (Signed)
Per weekend handoff from CSW, "per MD note no SNF days left, disposition--home once care giving is arranged w/ family." RNCM called CSW to discuss case and CSW updated RNCM on this discharge plan. RNCM aware and arranging for home health. CSW signing off.    Maryclare Labrador, MSW, [facility] Clinical Social Worker [phone]

## 2013-10-02 NOTE — Progress Notes (Signed)
Speech Language Pathology Treatment: Dysphagia  Patient Details Name: [name] MRN: [id number] DOB: July 09, 1928 Today's Date: 10/02/2013 Time: 4967-5916 SLP Time Calculation (min): 18 min  Assessment / Plan / Recommendation Clinical Impression  Pt seen to assess tolerance of po diet.  Pt was sitting upright in chair requested pain medicine - RN aware.  Pt admits to xerostomia but otherwise states swallow function is consistent with prior to admit.  Cough with and without po reported by pt.  SLP observed pt to consume pill with water from RN and water alone, no s/s of aspiration noted.  Swallow was timely with clear voice.   Pt apparently overall stronger than when seen by previous SLP, as he was able to feed himself today.  SLP provided written aspiration precautions to pt and sample packets of Biotene for xerostomia. He does admit to dyspnea with po intake, therefore recommend continue soft diet for energy conservation.   SLP to sign off as all education is completed.    HPI HPI: [name] is an 80 y.o. male lives at home with his wife, with hx of HTN, arthritis, DDD, rheumatoid arthritis on chronic low dose steroids, CKD III, brought to the ER with pain in the occiput. Wife said it was severe and he was screaming when he touches it. No blurry vx, stiff neck, confusion, photophobia, or diffuse HA. He also has been feeling weak as well. Work up in the ER included a negative head CT, Cr of 2.1, and WBC of 12.2K. He was given dilaudid with improvement of his pain, and was about to be discharged to home by EDP, but family was not comfortable taking him home, as his wife would not be able to get pain meds for him, and was concerned with his pain coming back. Was admitted for pain control.  Pt underwent MBS on 11/14 recommended Dys 3/nectar.  Pt diet advanced to thin/soft.     Pertinent Vitals Afebrile, decreased  SLP Plan  All goals met    Recommendations Diet recommendations: Dysphagia 3  (mechanical soft);Thin liquid Liquids provided via: Teaspoon;Cup Medication Administration: Whole meds with puree (or as tolerated, if small with water) Supervision: Intermittent supervision to cue for compensatory strategies Compensations: Slow rate;Small sips/bites Postural Changes and/or Swallow Maneuvers: Seated upright 90 degrees;Upright 30-60 min after meal              Oral Care Recommendations: Oral care BID Follow up Recommendations: None Plan: All goals met    [place], [facility] [place]

## 2013-10-02 NOTE — Progress Notes (Signed)
Discharged home accompanied by  His daughter; discharge instructions and Rxs. Given and reviewed.  Patient awaits follow up with NP from the [place] on Thursday of this week.

## 2013-10-02 NOTE — Progress Notes (Signed)
Physical Therapy Treatment Patient Details Name: [name] MRN: [id number] DOB: Feb 09, 1928 Today's Date: 10/02/2013 Time: 5573-2202 PT Time Calculation (min): 24 min  PT Assessment / Plan / Recommendation  History of Present Illness 80 y.o. male with PMHx of COPD, recurrent UTIs and recent treatment for cellulitis who had called the EMS reportedly for shortness of breath. However it is of note the patient denies ever having shortness of breath. His main complaint was of not feeling well overall with some pain in his lower extremities   PT Comments   Patient making progress towards goals this session. Continues to be limited by overall weakness. Able to incorporate generalized therex throughout session. Awaiting SNF placement at this time  Follow Up Recommendations  SNF     Does the patient have the potential to tolerate intense rehabilitation     Barriers to Discharge        Equipment Recommendations  None recommended by PT    Recommendations for Other Services    Frequency Min 3X/week   Progress towards PT Goals Progress towards PT goals: Progressing toward goals  Plan      Precautions / Restrictions Precautions Precautions: Fall   Pertinent Vitals/Pain Complained of neck pain. RN aware    Mobility  Bed Mobility Bed Mobility: Not assessed Transfers Sit to Stand: 4: Min assist;With upper extremity assist;From chair/3-in-1 Stand to Sit: 4: Min assist;With upper extremity assist;To chair/3-in-1 Details for Transfer Assistance: assist to elevate to standing position, cues for hand placement. Stood x4 Ambulation/Gait Ambulation/Gait Assistance: 4: Min Environmental consultant (Feet): 60 Feet Assistive device: Rolling walker Ambulation/Gait Assistance Details: Patient to ambulate 49ft x2 with rest breaks in between and 60ft with no breaks. Cues for posture and safety with RW. A for overall stability and balance due to generalized weakness Gait Pattern: Step-to  pattern;Shuffle;Trunk flexed;Narrow base of support Gait velocity: decreased    Exercises General Exercises - Lower Extremity Long Arc Quad: AROM;Both;Other reps (comment) Hip Flexion/Marching: AROM;Both;Other reps (comment)   PT Diagnosis:    PT Problem List:   PT Treatment Interventions:     PT Goals (current goals can now be found in the care plan section)    Visit Information  Last PT Received On: 10/02/13 Assistance Needed: +1 History of Present Illness: 80 y.o. male with PMHx of COPD, recurrent UTIs and recent treatment for cellulitis who had called the EMS reportedly for shortness of breath. However it is of note the patient denies ever having shortness of breath. His main complaint was of not feeling well overall with some pain in his lower extremities    Subjective Data      Cognition  Cognition Arousal/Alertness: Awake/alert Behavior During Therapy: WFL for tasks assessed/performed Overall Cognitive Status: Within Functional Limits for tasks assessed    Balance     End of Session PT - End of Session Equipment Utilized During Treatment: Gait belt Activity Tolerance: Patient tolerated treatment well;Patient limited by fatigue Patient left: in chair;with call bell/phone within reach;with chair alarm set   GP     Fredrich Birks 10/02/2013, 10:09 AM 10/02/2013 Fredrich Birks PTA [phone] pager [phone] office

## 2013-10-03 LAB — URINE CULTURE: Colony Count: >=100000

## 2013-10-10 ENCOUNTER — Ambulatory Visit: Payer: Medicare Other | Admitting: Gastroenterology

## 2013-10-23 ENCOUNTER — Other Ambulatory Visit: Payer: Self-pay | Admitting: Dermatology

## 2014-09-29 ENCOUNTER — Emergency Department (HOSPITAL_COMMUNITY)
Admission: EM | Admit: 2014-09-29 | Discharge: 2014-09-29 | Disposition: A | Discharge status: Home / Self Care | Payer: Medicare Other | Attending: Emergency Medicine | Admitting: Emergency Medicine

## 2014-09-29 ENCOUNTER — Encounter (HOSPITAL_COMMUNITY): Payer: Self-pay | Admitting: Emergency Medicine

## 2014-09-29 ENCOUNTER — Emergency Department (HOSPITAL_COMMUNITY): Payer: Medicare Other

## 2014-09-29 DIAGNOSIS — Z7982 Long term (current) use of aspirin: Secondary | ICD-10-CM | POA: Insufficient documentation

## 2014-09-29 DIAGNOSIS — Z7952 Long term (current) use of systemic steroids: Secondary | ICD-10-CM | POA: Insufficient documentation

## 2014-09-29 DIAGNOSIS — N183 Chronic kidney disease, stage 3 (moderate): Secondary | ICD-10-CM | POA: Diagnosis not present

## 2014-09-29 DIAGNOSIS — Z86010 Personal history of colonic polyps: Secondary | ICD-10-CM | POA: Insufficient documentation

## 2014-09-29 DIAGNOSIS — Z8719 Personal history of other diseases of the digestive system: Secondary | ICD-10-CM | POA: Diagnosis not present

## 2014-09-29 DIAGNOSIS — M25552 Pain in left hip: Secondary | ICD-10-CM | POA: Diagnosis present

## 2014-09-29 DIAGNOSIS — Z862 Personal history of diseases of the blood and blood-forming organs and certain disorders involving the immune mechanism: Secondary | ICD-10-CM | POA: Diagnosis not present

## 2014-09-29 DIAGNOSIS — Z880 Allergy status to penicillin: Secondary | ICD-10-CM | POA: Diagnosis not present

## 2014-09-29 DIAGNOSIS — Z8619 Personal history of other infectious and parasitic diseases: Secondary | ICD-10-CM | POA: Insufficient documentation

## 2014-09-29 DIAGNOSIS — R52 Pain, unspecified: Secondary | ICD-10-CM

## 2014-09-29 DIAGNOSIS — Z79899 Other long term (current) drug therapy: Secondary | ICD-10-CM | POA: Diagnosis not present

## 2014-09-29 DIAGNOSIS — M1990 Unspecified osteoarthritis, unspecified site: Secondary | ICD-10-CM | POA: Diagnosis not present

## 2014-09-29 DIAGNOSIS — Z87891 Personal history of nicotine dependence: Secondary | ICD-10-CM | POA: Diagnosis not present

## 2014-09-29 DIAGNOSIS — I129 Hypertensive chronic kidney disease with stage 1 through stage 4 chronic kidney disease, or unspecified chronic kidney disease: Secondary | ICD-10-CM | POA: Diagnosis not present

## 2014-09-29 DIAGNOSIS — Z8669 Personal history of other diseases of the nervous system and sense organs: Secondary | ICD-10-CM | POA: Diagnosis not present

## 2014-09-29 MED ORDER — LIDOCAINE 5 % EX PTCH: 1.0000 | MEDICATED_PATCH | CUTANEOUS | Status: DC

## 2014-09-29 NOTE — ED Notes (Signed)
PTAR at bedside 

## 2014-09-29 NOTE — Discharge Instructions (Signed)
Arthritis, Nonspecific °Arthritis is inflammation of a joint. This usually means pain, redness, warmth or swelling are present. One or more joints may be involved. There are a number of types of arthritis. Your caregiver may not be able to tell what type of arthritis you have right away. °CAUSES  °The most common cause of arthritis is the wear and tear on the joint (osteoarthritis). This causes damage to the cartilage, which can break down over time. The knees, hips, back and neck are most often affected by this type of arthritis. °Other types of arthritis and common causes of joint pain include: °· Sprains and other injuries near the joint. Sometimes minor sprains and injuries cause pain and swelling that develop hours later. °· Rheumatoid arthritis. This affects hands, feet and knees. It usually affects both sides of your body at the same time. It is often associated with chronic ailments, fever, weight loss and general weakness. °· Crystal arthritis. Gout and pseudo gout can cause occasional acute severe pain, redness and swelling in the foot, ankle, or knee. °· Infectious arthritis. Bacteria can get into a joint through a break in overlying skin. This can cause infection of the joint. Bacteria and viruses can also spread through the blood and affect your joints. °· Drug, infectious and allergy reactions. Sometimes joints can become mildly painful and slightly swollen with these types of illnesses. °SYMPTOMS  °· Pain is the main symptom. °· Your joint or joints can also be red, swollen and warm or hot to the touch. °· You may have a fever with certain types of arthritis, or even feel overall ill. °· The joint with arthritis will hurt with movement. Stiffness is present with some types of arthritis. °DIAGNOSIS  °Your caregiver will suspect arthritis based on your description of your symptoms and on your exam. Testing may be needed to find the type of arthritis: °· Blood and sometimes urine tests. °· X-ray tests  and sometimes CT or MRI scans. °· Removal of fluid from the joint (arthrocentesis) is done to check for bacteria, crystals or other causes. Your caregiver (or a specialist) will numb the area over the joint with a local anesthetic, and use a needle to remove joint fluid for examination. This procedure is only minimally uncomfortable. °· Even with these tests, your caregiver may not be able to tell what kind of arthritis you have. Consultation with a specialist (rheumatologist) may be helpful. °TREATMENT  °Your caregiver will discuss with you treatment specific to your type of arthritis. If the specific type cannot be determined, then the following general recommendations may apply. °Treatment of severe joint pain includes: °· Rest. °· Elevation. °· Anti-inflammatory medication (for example, ibuprofen) may be prescribed. Avoiding activities that cause increased pain. °· Only take over-the-counter or prescription medicines for pain and discomfort as recommended by your caregiver. °· Cold packs over an inflamed joint may be used for 10 to 15 minutes every hour. Hot packs sometimes feel better, but do not use overnight. Do not use hot packs if you are diabetic without your caregiver's permission. °· A cortisone shot into arthritic joints may help reduce pain and swelling. °· Any acute arthritis that gets worse over the next 1 to 2 days needs to be looked at to be sure there is no joint infection. °Long-term arthritis treatment involves modifying activities and lifestyle to reduce joint stress jarring. This can include weight loss. Also, exercise is needed to nourish the joint cartilage and remove waste. This helps keep the muscles   around the joint strong. °HOME CARE INSTRUCTIONS  °· Do not take aspirin to relieve pain if gout is suspected. This elevates uric acid levels. °· Only take over-the-counter or prescription medicines for pain, discomfort or fever as directed by your caregiver. °· Rest the joint as much as  possible. °· If your joint is swollen, keep it elevated. °· Use crutches if the painful joint is in your leg. °· Drinking plenty of fluids may help for certain types of arthritis. °· Follow your caregiver's dietary instructions. °· Try low-impact exercise such as: °¨ Swimming. °¨ Water aerobics. °¨ Biking. °¨ Walking. °· Morning stiffness is often relieved by a warm shower. °· Put your joints through regular range-of-motion. °SEEK MEDICAL CARE IF:  °· You do not feel better in 24 hours or are getting worse. °· You have side effects to medications, or are not getting better with treatment. °SEEK IMMEDIATE MEDICAL CARE IF:  °· You have a fever. °· You develop severe joint pain, swelling or redness. °· Many joints are involved and become painful and swollen. °· There is severe back pain and/or leg weakness. °· You have loss of bowel or bladder control. °Document Released: 10/22/2004 Document Revised: 12/07/2011 Document Reviewed: 11/07/2008 °[employer]® Patient Information ©2015 [employer]. This information is not intended to replace advice given to you by your health care provider. Make sure you discuss any questions you have with your health care provider. ° °

## 2014-09-29 NOTE — ED Notes (Signed)
Pt from home via [facility] c/o left hip pain he rates his pain 6/10 and sharp. He denies recent falls. No obvious deformity or shortening. He reports it is painful to bear weight on left leg. He uses 2.5L O2 at home.

## 2014-09-29 NOTE — ED Provider Notes (Signed)
CSN: [id number]     Arrival date & time 09/29/14  1526 History   First MD Initiated Contact with Patient 09/29/14 1549     Chief Complaint  Patient presents with  . Hip Pain      HPI Patient presents to the emergency department complaining of left hip pain.  No recent injury or fall.  He's had a long-standing history of pain in his bilateral hips left greater the right.  He states this seems to be worsening over the past several weeks.  He continues to walk on this.  No fevers or chills.  He tried his tramadol for pain but states he doesn't like how it makes his head feel.  He has attempted to get into his primary care physician but his been unable to.  Family is trying to also get him into physical therapy to help with his ambulation, his strength, flexibility.  Pain is mild to moderate in severity.   Past Medical History  Diagnosis Date  . Hypertension   . Arthritis   . Ex-smoker   . Rheumatoid arthritis(714.0)   . Pulmonary nodule   . DDD (degenerative disc disease)   . Complication of anesthesia     " I am difficult to wake up "  . Chronic kidney disease     acute 2013, 2014 on chronic stage 3.  . Anemia   . Proteus septicemia 02/2013  . Barrett's esophagus 04/2006    egd by dr Virginia Rochester.   . Colon polyp 04/2006    Hyperplastic on colonoscopy by Dr Virginia Rochester.   . Encephalopathy, metabolic 02/2013   Past Surgical History  Procedure Laterality Date  . Joint replacement      bilateral knee replacement  . Hernia repair    . Back surgery      X 2  . Appendectomy     Family History  Problem Relation Age of Onset  . Coronary artery disease Brother   . Hypertension Father   . Hypertension Mother    History  Substance Use Topics  . Smoking status: Former Smoker -- 0.50 packs/day for 30 years    Types: Cigarettes    Quit date: 10/12/1973  . Smokeless tobacco: Never Used  . Alcohol Use: No    Review of Systems  All other systems reviewed and are negative.     Allergies   Codeine; Hydrocodone; Oxycodone; Penicillins; and Sulfa antibiotics  Home Medications   Prior to Admission medications   Medication Sig Start Date End Date Taking? Authorizing Provider  albuterol (PROVENTIL) (2.5 MG/3ML) 0.083% nebulizer solution Take 2.5 mg by nebulization 3 (three) times daily. For shortness of breath   Yes Historical Provider, MD  aspirin EC 81 MG tablet Take 81 mg by mouth every morning.    Yes Historical Provider, MD  camphor-menthol Wynelle Fanny) lotion Apply 1 application topically daily as needed for itching.   Yes Historical Provider, MD  escitalopram (LEXAPRO) 5 MG tablet Take 5 mg by mouth daily.   Yes Historical Provider, MD  hydrocortisone cream 1 % Apply 1 application topically daily as needed for itching.   Yes Historical Provider, MD  Multiple Vitamin (MULTIVITAMIN WITH MINERALS) TABS tablet Take 1 tablet by mouth daily.   Yes Historical Provider, MD  omeprazole (PRILOSEC) 40 MG capsule Take 40 mg by mouth daily.   Yes Historical Provider, MD  predniSONE (DELTASONE) 5 MG tablet Take 5 mg by mouth daily.   Yes Historical Provider, MD  Probiotic Product (ALIGN) 4 MG  CAPS Take 1 capsule by mouth 2 (two) times daily.    Yes Historical Provider, MD  Tamsulosin HCl (FLOMAX) 0.4 MG CAPS Take 1 capsule (0.4 mg total) by mouth daily after supper. 06/13/12  Yes Vassie Loll, MD  torsemide (DEMADEX) 20 MG tablet Take 20 mg by mouth every other day.    Yes Historical Provider, MD  trimethoprim (TRIMPEX) 100 MG tablet Take 100 mg by mouth every other day.    Yes Historical Provider, MD  feeding supplement, ENSURE COMPLETE, (ENSURE COMPLETE) LIQD Take 237 mLs by mouth 2 (two) times daily between meals. Patient not taking: Reported on 09/29/2014 10/02/13   Nishant Dhungel, MD  HYDROmorphone (DILAUDID) 2 MG tablet Take 0.5 tablets (1 mg total) by mouth every 8 (eight) hours as needed for severe pain. Patient not taking: Reported on 09/29/2014 10/02/13   Nishant Dhungel, MD  lidocaine  (LIDODERM) 5 % Place 1 patch onto the skin daily. Remove & Discard patch within 12 hours or as directed by MD Patient not taking: Reported on 09/29/2014 10/02/13   Nishant Dhungel, MD         traMADol (ULTRAM) 50 MG tablet Take 1 tablet (50 mg total) by mouth every 6 (six) hours as needed for moderate pain. Patient not taking: Reported on 09/29/2014 10/02/13   Nishant Dhungel, MD   BP 158/75 mmHg  Pulse 81  Temp(Src) 97.7 F (36.5 C) (Oral)  Resp 18  SpO2 97% Physical Exam  Constitutional: He is oriented to person, place, and time. He appears well-developed and well-nourished.  HENT:  Head: Normocephalic and atraumatic.  Eyes: EOM are normal.  Neck: Normal range of motion.  Cardiovascular: Normal rate, regular rhythm, normal heart sounds and intact distal pulses.   Pulmonary/Chest: Effort normal and breath sounds normal. No respiratory distress.  Abdominal: Soft. He exhibits no distension. There is no tenderness.  Musculoskeletal: Normal range of motion.  Mild pain with range of motion of the left hip.  No significant limitations however  Neurological: He is alert and oriented to person, place, and time.  Skin: Skin is warm and dry.  Psychiatric: He has a normal mood and affect. Judgment normal.  Nursing note and vitals reviewed.   ED Course  Procedures (including critical care time) Labs Review Labs Reviewed - No data to display  Imaging Review Dg Hip Complete Left  09/29/2014   CLINICAL DATA:  Left hip pain for 1 month, no known injury.  EXAM: LEFT HIP - COMPLETE 2+ VIEW  COMPARISON:  Abdomen x-ray in March 20, 2014  FINDINGS: There is no evidence of hip fracture or dislocation. There are chronic changes with chronic narrowed joint spaces of bilateral hips, chronic patchy increased sclerosis of right acetabulum and proximal right femur stable compared to prior abdominal plain film. There are degenerative joint changes of lumbar spine.  IMPRESSION: No acute fracture or dislocation. Stable  chronic changes of bilateral hips.   Electronically Signed   By: Sherian Rein M.D.   On: 09/29/2014 15:57  I personally reviewed the imaging tests through PACS system I reviewed available ER/hospitalization records through the EMR    EKG Interpretation None      MDM   Final diagnoses:  Left hip pain  Arthritis    Likely arthritic-like pain.  Home with Lidoderm patch in addition to his tramadol.  Primary care follow-up.  Patient cannot tolerate codeine, hydrocodone, oxycodone.  Doubt fracture.  Ambulatory.  No indication for additional treatment.    Lyanne Co,  MD 09/29/14 1737

## 2014-09-29 NOTE — ED Notes (Signed)
[facility] Called 

## 2014-11-07 IMAGING — US US RENAL
1 series · 14 of 17 positions shown · non-contrast
Comparison: 06/10/2012

CLINICAL DATA: Recurrent urinary tract infections and renal
failure.

RENAL/URINARY TRACT ULTRASOUND COMPLETE

[Series 1: us renal · 0.27mm/px · 14 of 17 slices shown]
[im 1/17]
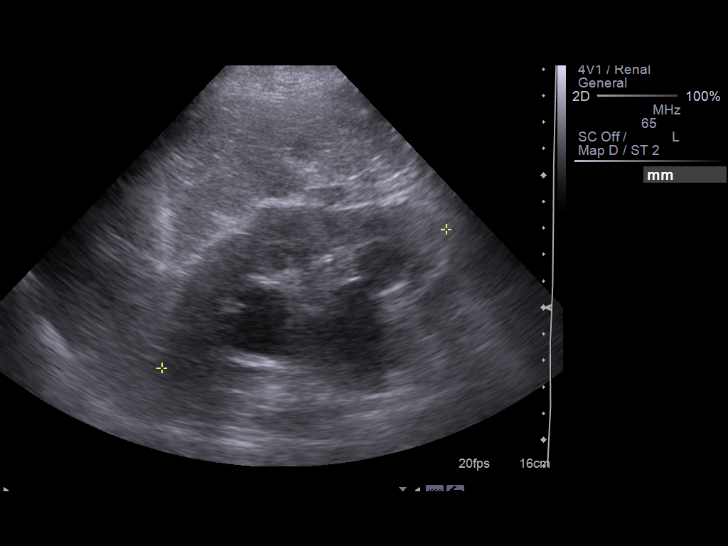
[im 2/17]
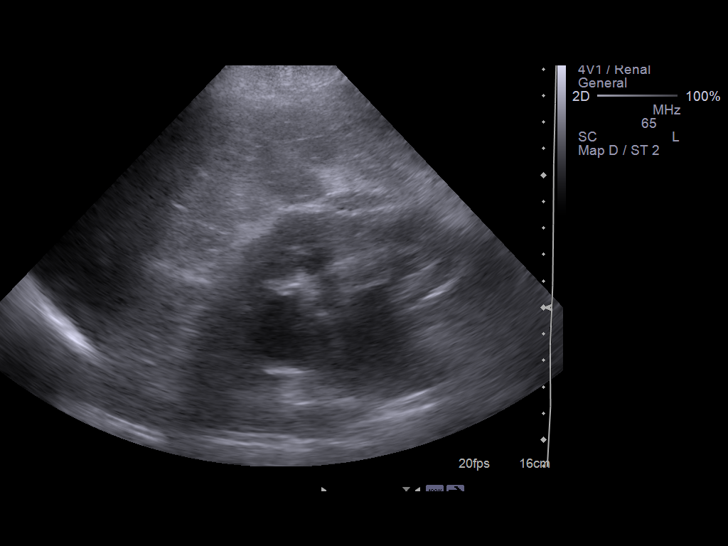
[im 4/17]
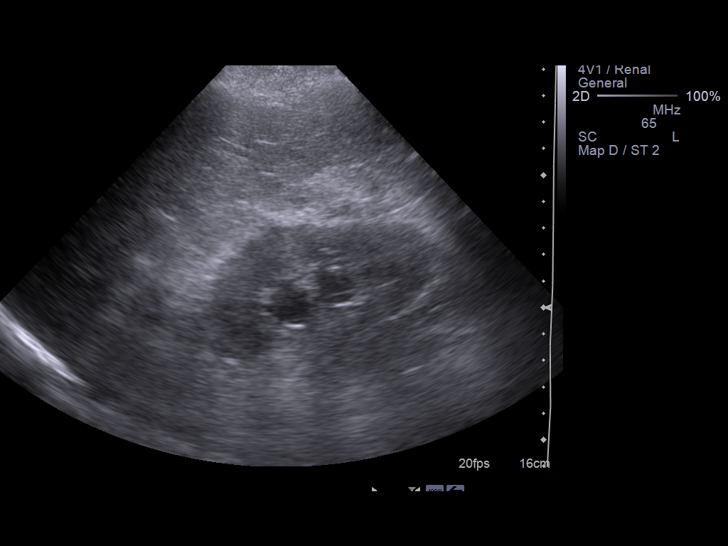
[im 5/17]
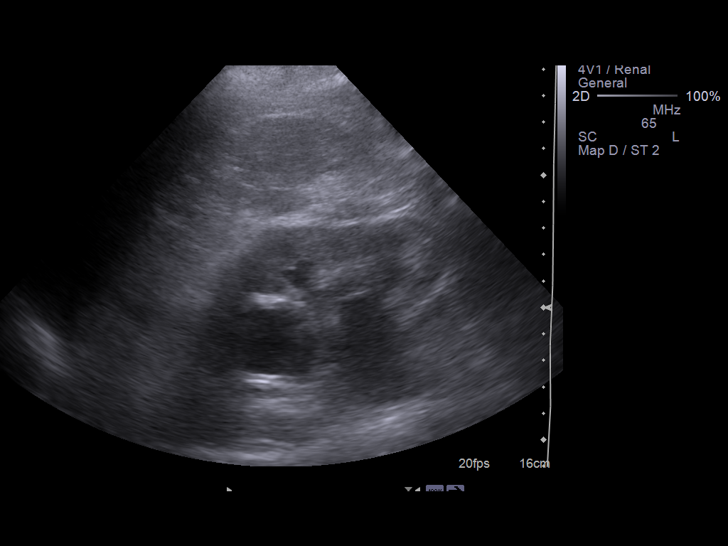
[im 6/17]
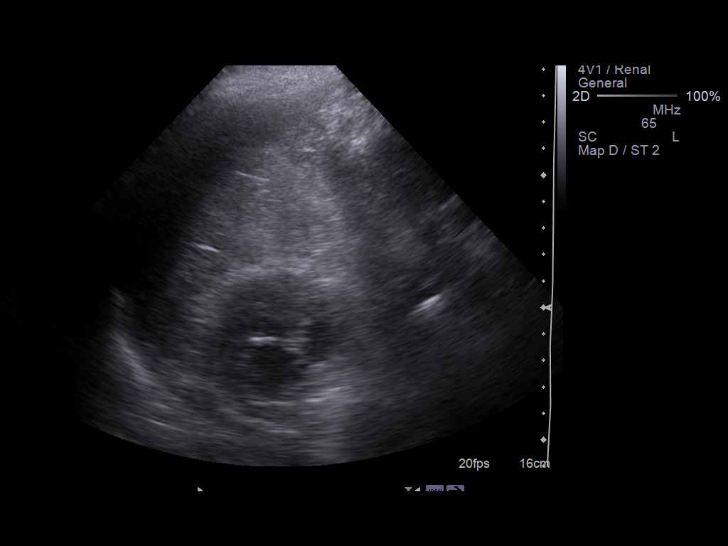
[im 7/17]
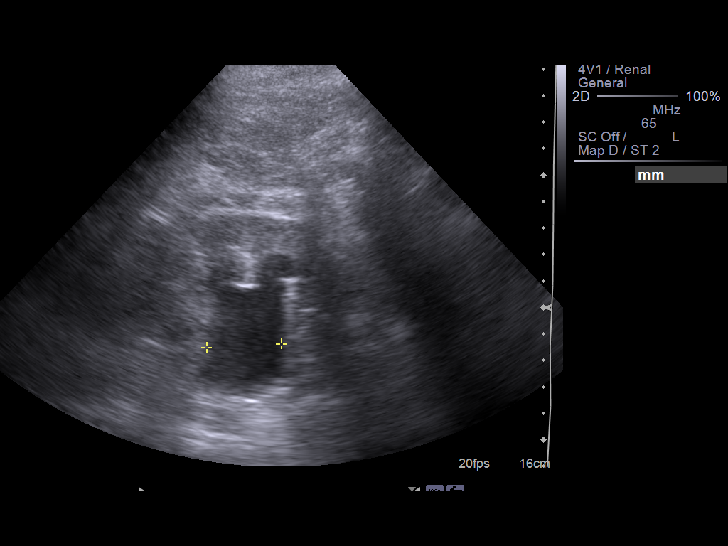
[im 8/17]
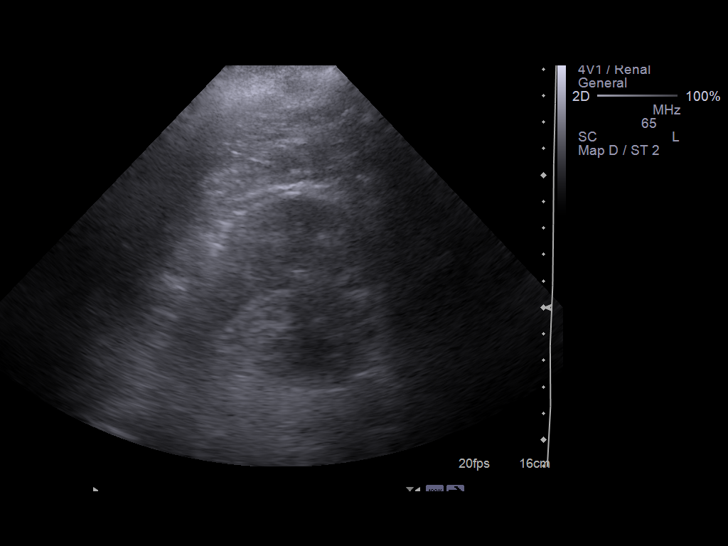
[im 10/17]
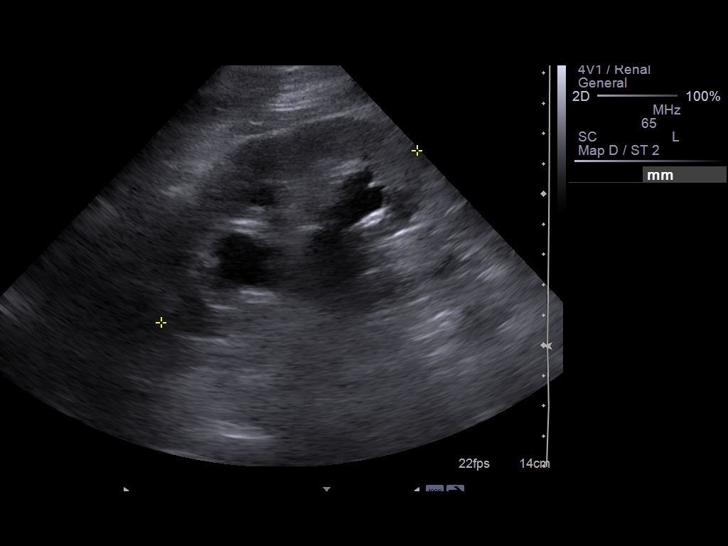
[im 11/17]
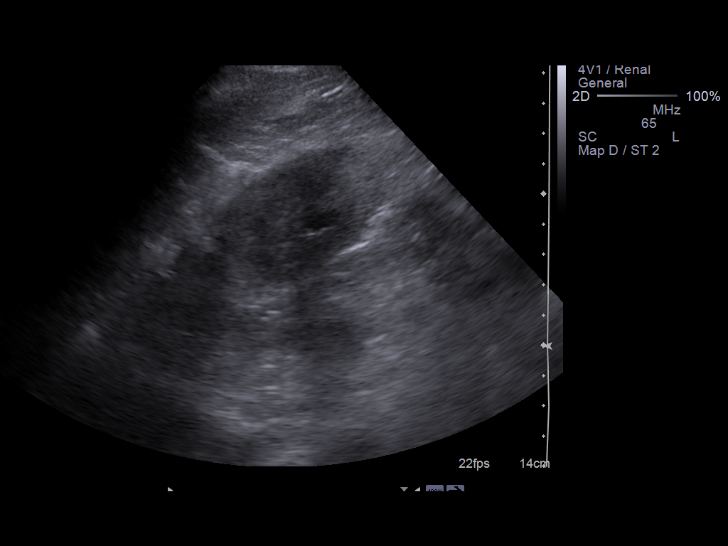
[im 12/17]
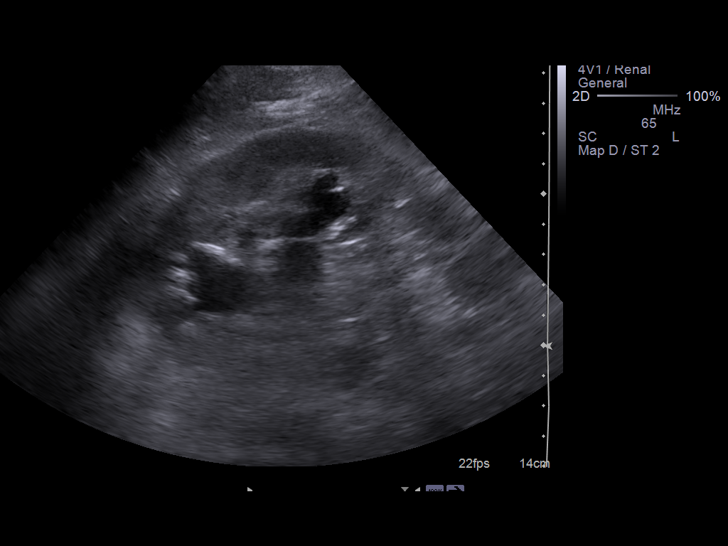
[im 13/17]
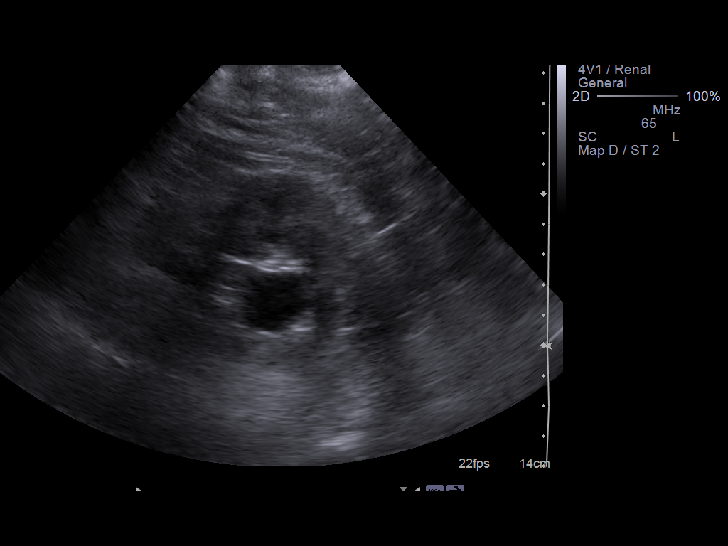
[im 14/17]
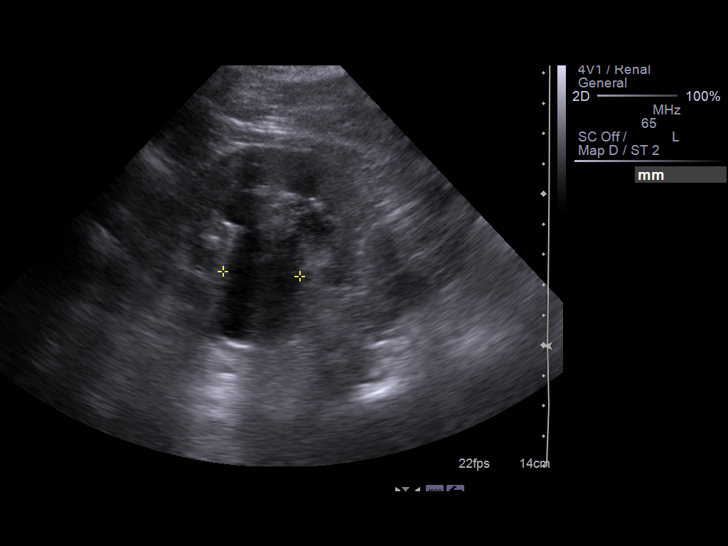
[im 16/17]
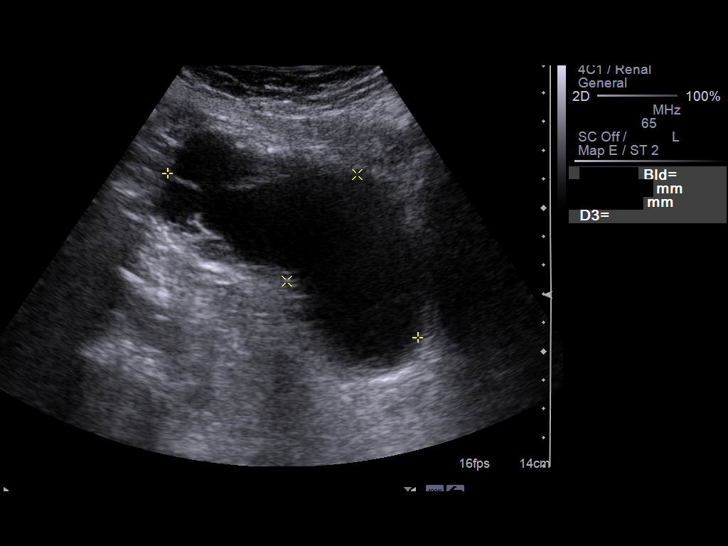
[im 17/17]
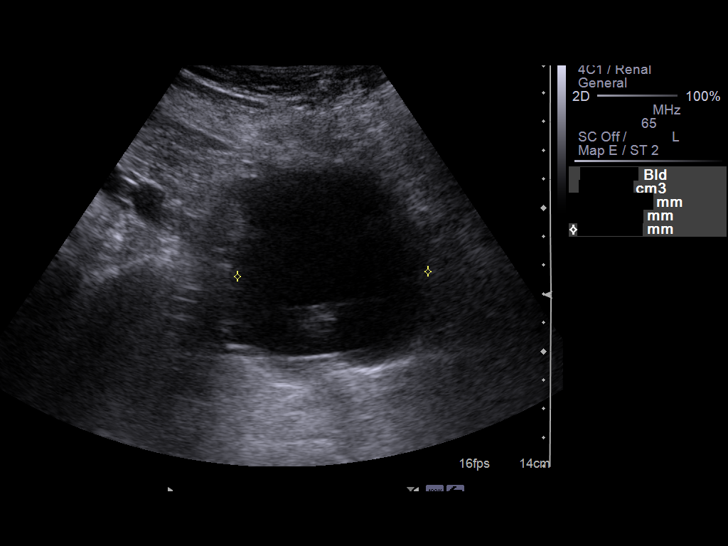

[14 of 17 positions shown; findings below may reference images not displayed]

FINDINGS: Right Kidney:  12 cm in length.  Moderately severe hydronephrosis
present.  The cortex is echogenic.

Left Kidney:  10.2 cm in length.  Moderately severe hydronephrosis
present.  The cortex is echogenic.

Bladder:  The bladder is irregular in appearance with irregular
wall thickening and potential internal areas of septation.
Underlying tumor or debris in the bladder cannot be excluded by
ultrasound.
IMPRESSION: Significant bilateral hydronephrosis present.  Hydronephrosis was
not present on the prior renal ultrasound.  Level of obstruction
may be at the bladder where irregular wall thickening is
identified.  Findings were communicated by telephone to Lakshman Sorour
with [REDACTED]s.

## 2015-02-09 IMAGING — US US RENAL
1 series · 14 of 25 positions shown · non-contrast
Comparison: CT scan 04/29/2013.

CLINICAL DATA: Hydronephrosis

EXAM:
RENAL/URINARY TRACT ULTRASOUND COMPLETE

[Series 1: us renal · 0.24mm/px · 14 of 31 slices shown]
[im 1/31]
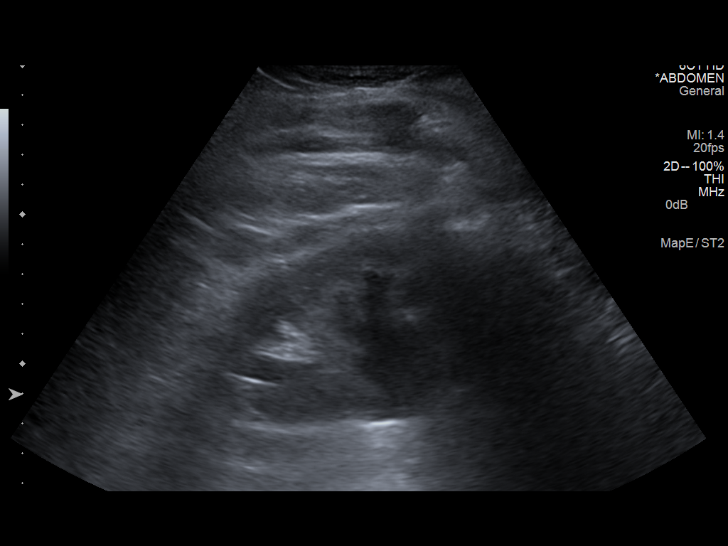
[im 3/31]
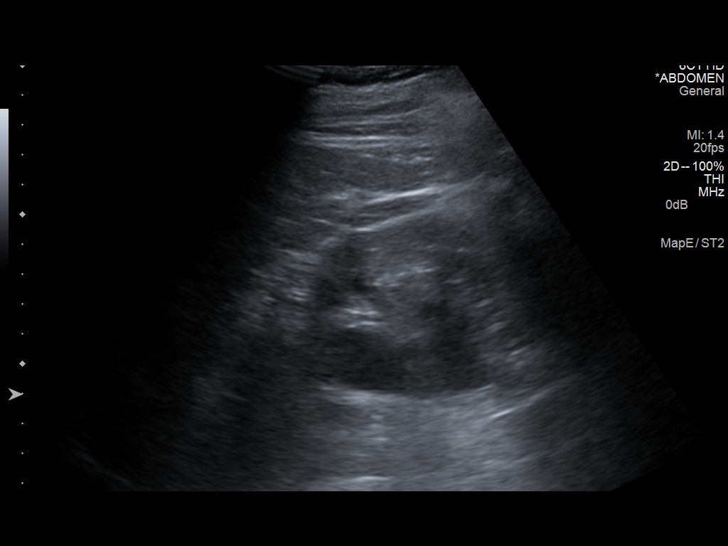
[im 6/31]
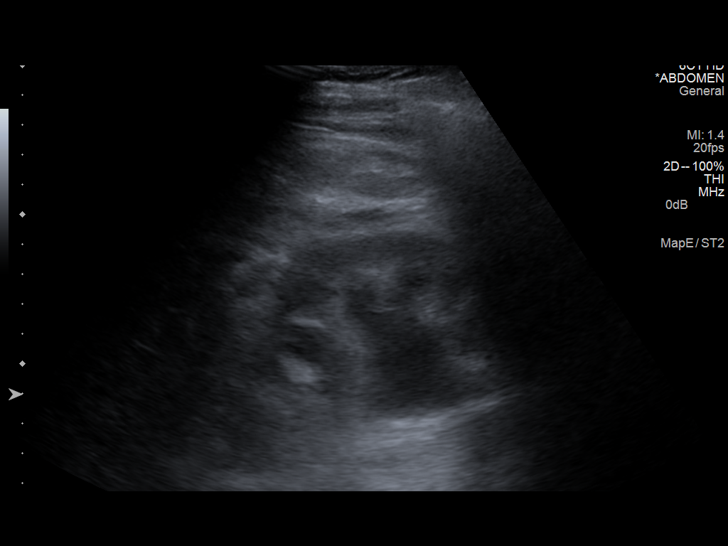
[im 8/31]
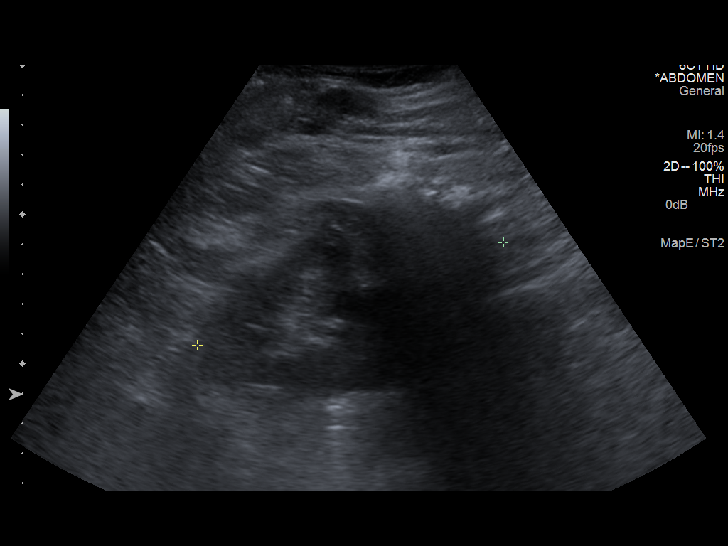
[im 11/31]
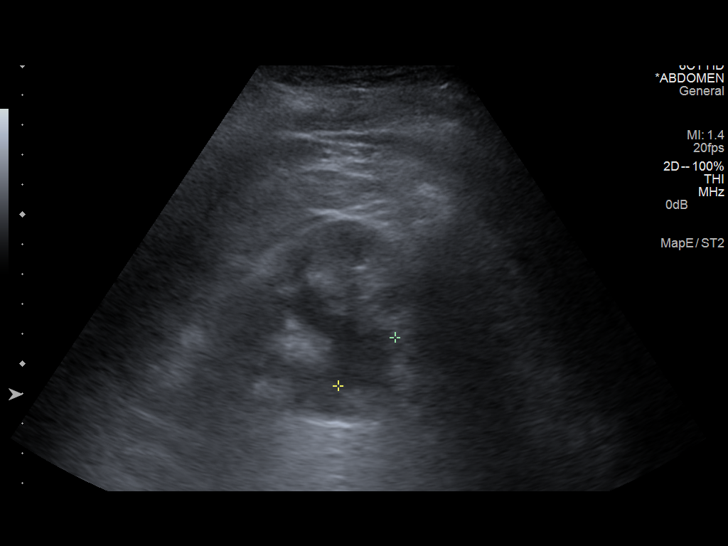
[im 12/31]
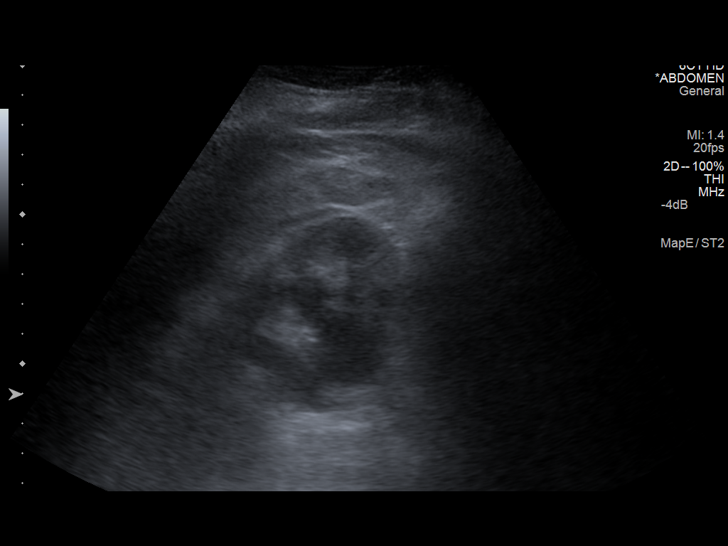
[im 14/31]
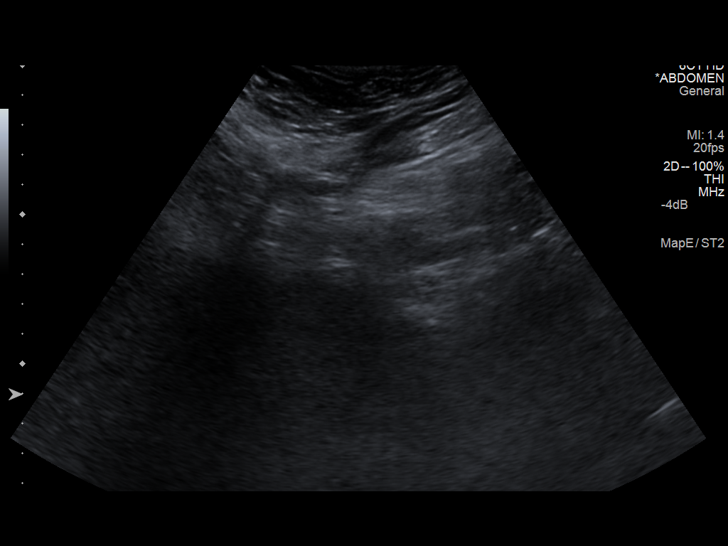
[im 17/31]
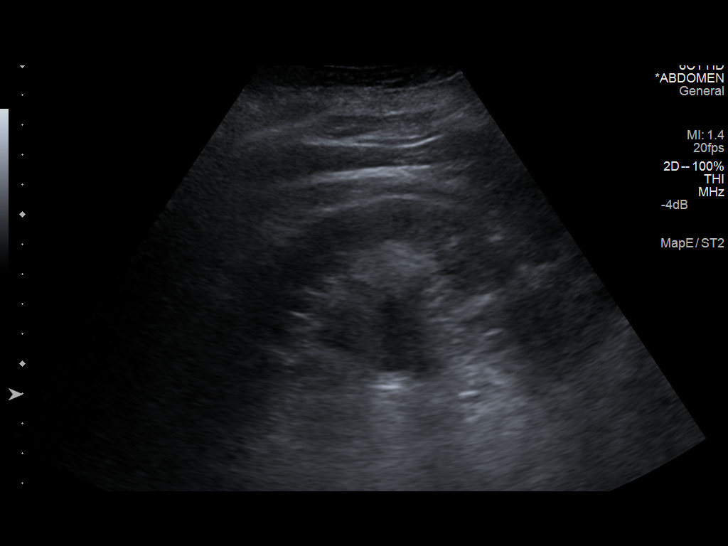
[im 19/31]
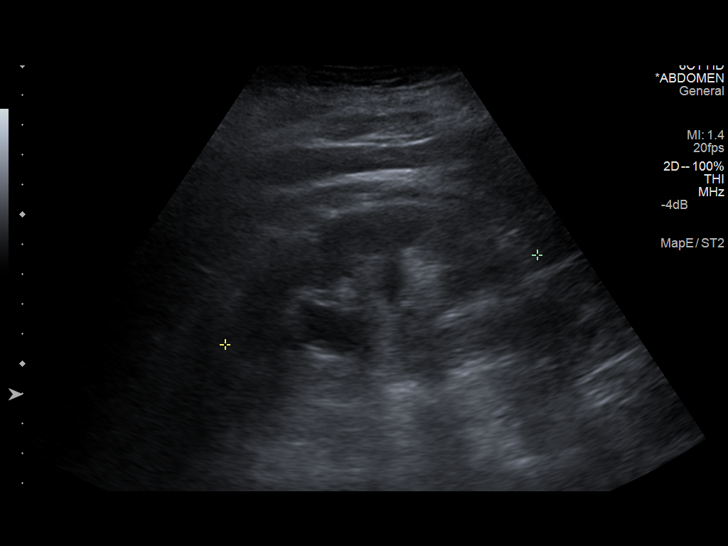
[im 21/31]
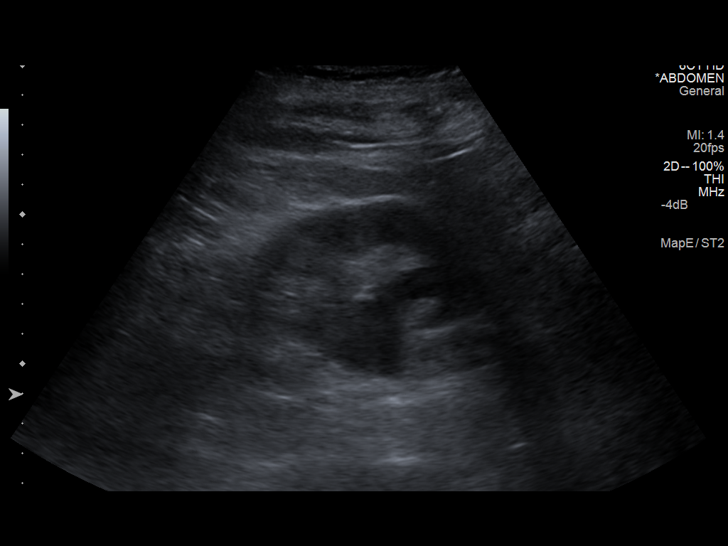
[im 23/31]
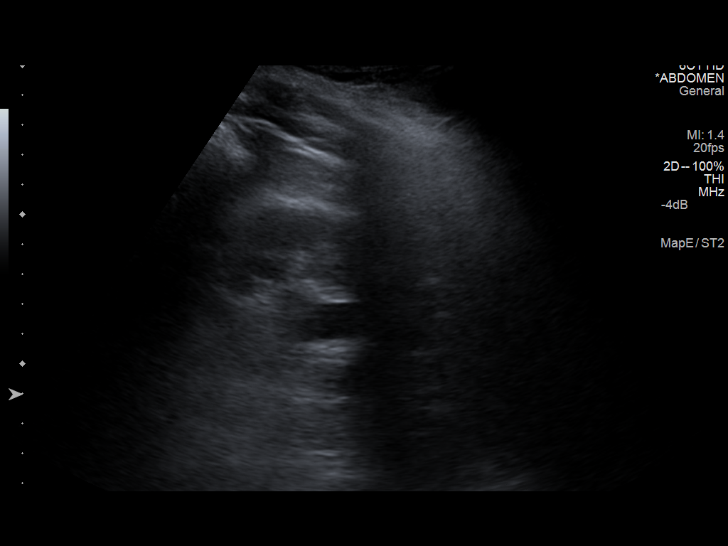
[im 26/31]
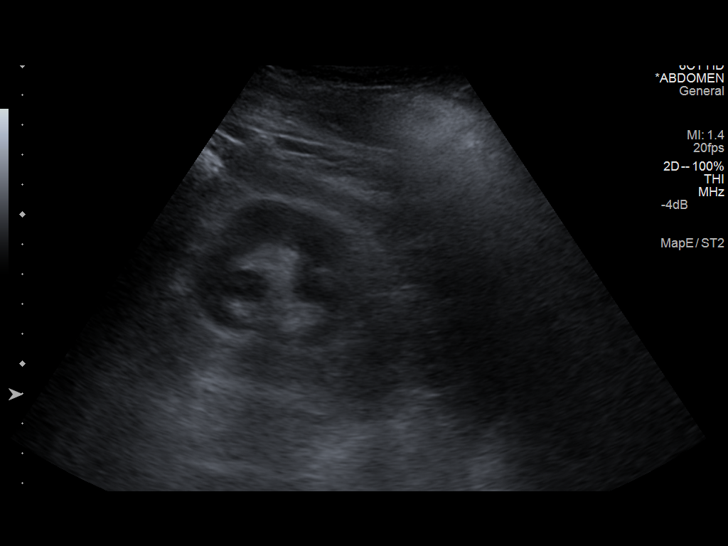
[im 28/31]
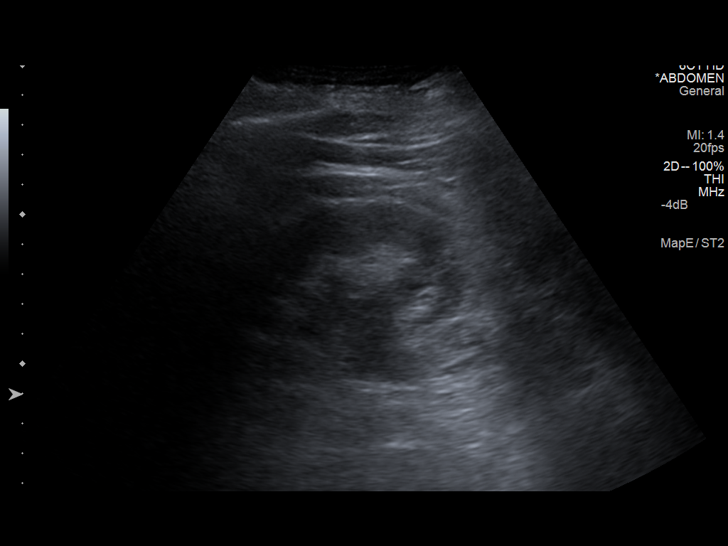
[im 31/31]
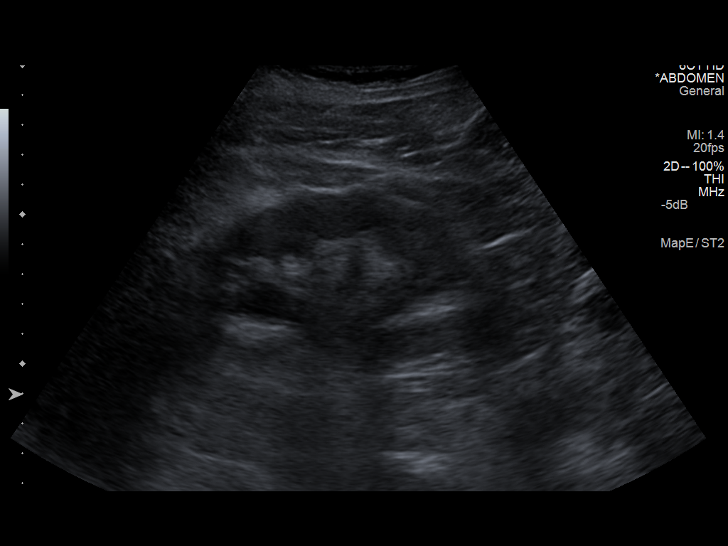

[14 of 25 positions shown; findings below may reference images not displayed]

FINDINGS: Right Kidney

Length: 10.8 cm. Normal echogenicity and normal renal cortical
thickness. No focal lesions. Moderate to advanced hydronephrosis.

Left Kidney

Length: 10.8 cm. Normal echogenicity and normal renal cortical
thickness. Moderate hydronephrosis. No definite renal calculi.

Bladder

Decompressed by a Foley catheter.
IMPRESSION: Bilateral hydronephrosis, right greater than left.

## 2015-02-11 IMAGING — CR DG CHEST 1V PORT
1 series · 1 of 1 positions shown · non-contrast
Comparison: Chest x-ray 08/02/2013.

CLINICAL DATA: Evaluate endotracheal tube and central line
placement.

EXAM:
PORTABLE CHEST - 1 VIEW

[AP]
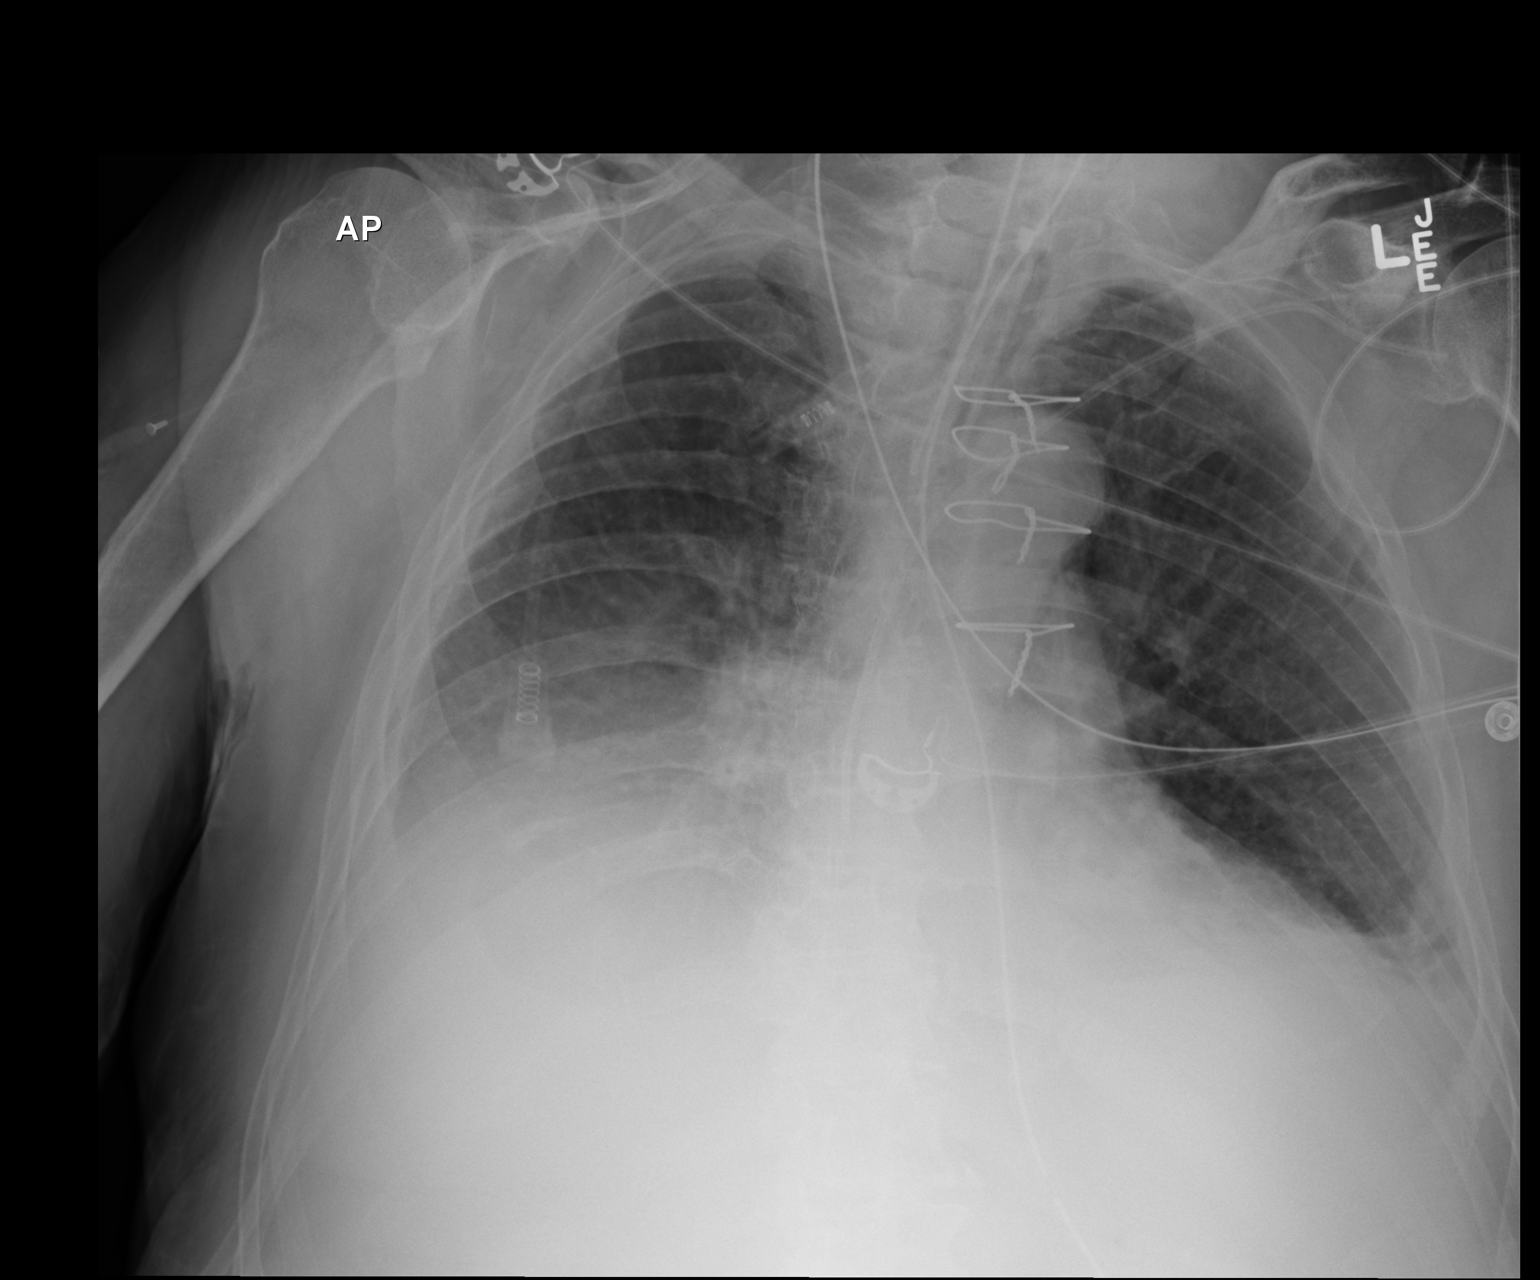

[1 of 1 positions shown; findings below may reference images not displayed]

FINDINGS: An endotracheal tube is in place with tip 3.2 cm above the carina. A
nasogastric tube is seen extending into the stomach, however, the
tip of the nasogastric tube extends below the lower margin of the
image. There is a left-sided subclavian central venous catheter with
tip terminating in the superior cavoatrial junction. Lung volumes
are low. Persistent bibasilar opacities may reflect areas of
atelectasis and/or consolidation. Small left and small to moderate
right pleural effusions are unchanged. Mild crowding of the
pulmonary vasculature, without frank pulmonary edema. Mild
cardiomegaly. The patient is rotated to the left on today's exam,
resulting in distortion of the mediastinal contours and reduced
diagnostic sensitivity and specificity for mediastinal pathology.
Atherosclerosis in the thoracic aorta. Status post median
sternotomy.
IMPRESSION: 1. Support apparatus, as above.
2. Improving aeration, likely related to resolving pulmonary edema.
3. Persistent bibasilar atelectasis and/or consolidation with small
to moderate right and small left pleural effusions.

## 2015-02-14 IMAGING — CR DG CHEST 1V PORT
1 series · 1 of 1 positions shown · non-contrast
Comparison: 08/04/2013

CLINICAL DATA: Respiratory failure and pleural effusions.

EXAM:
PORTABLE CHEST - 1 VIEW

[AP]
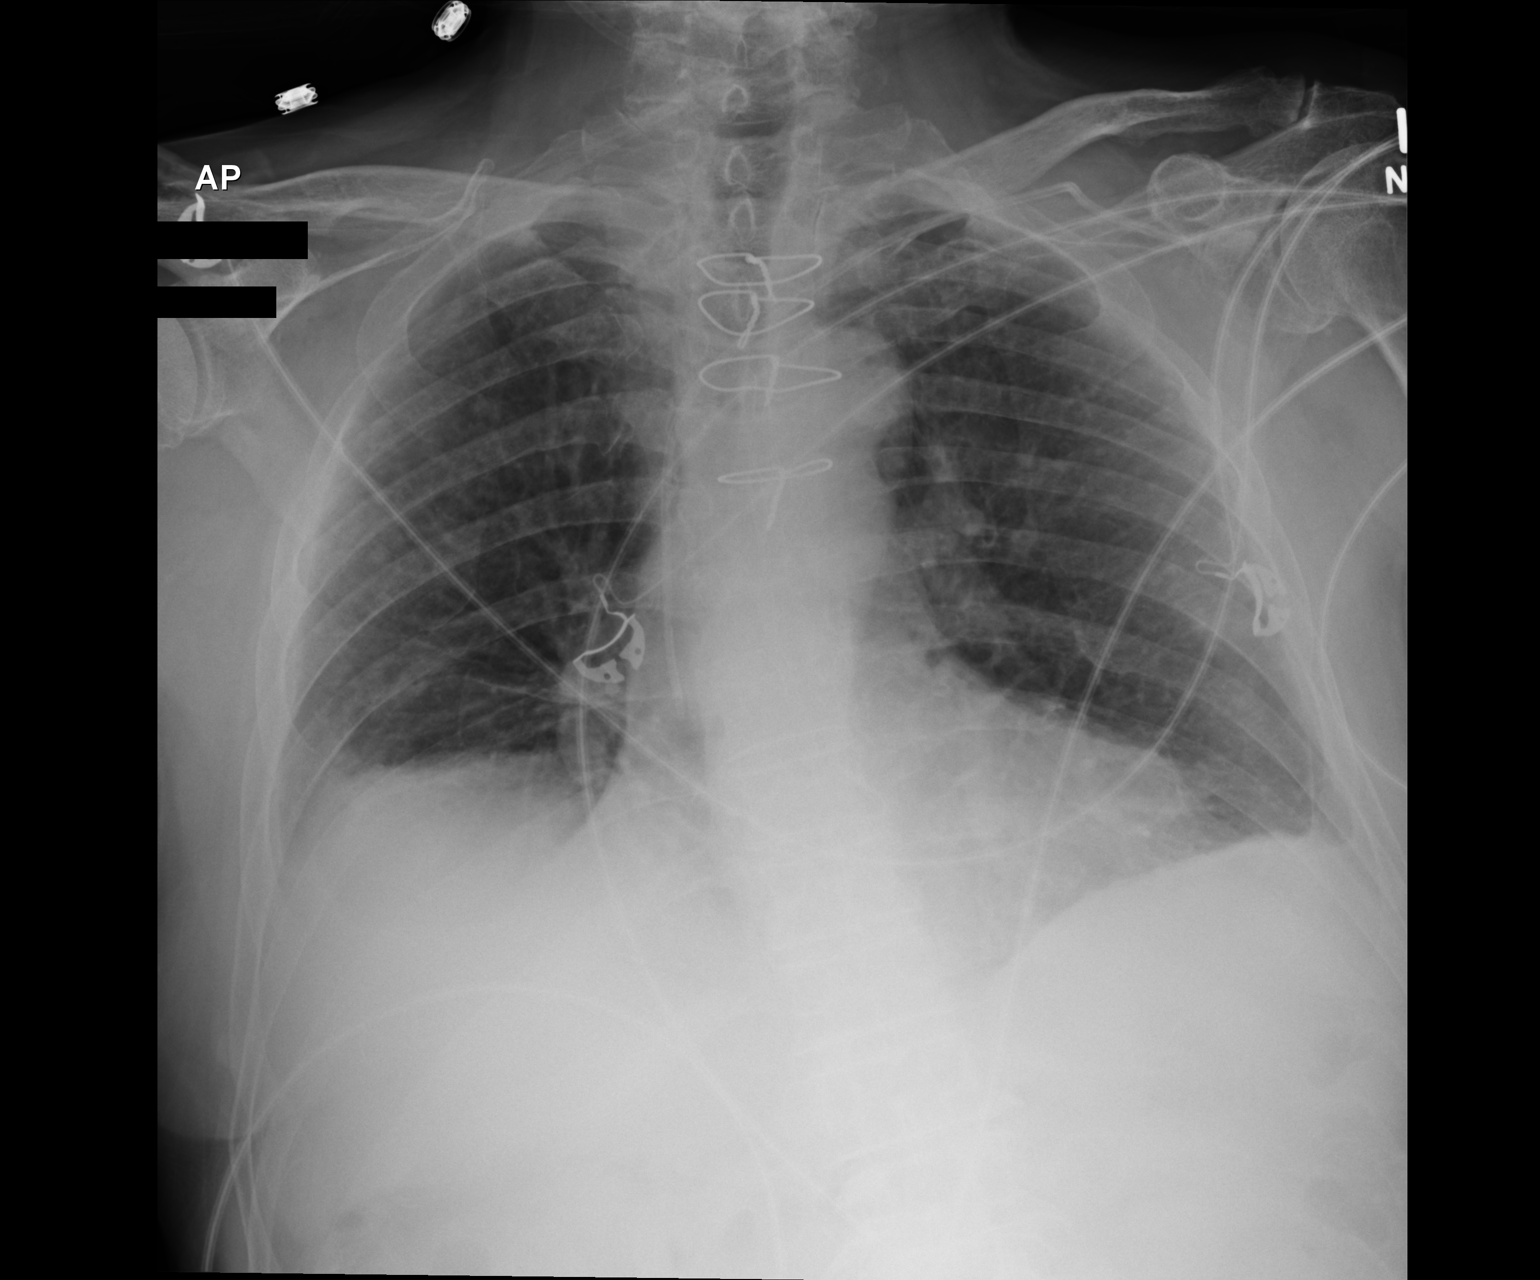

[1 of 1 positions shown; findings below may reference images not displayed]

FINDINGS: Interval extubation and removal of nasogastric tube. Left-sided
central line shows stable positioning. Lungs show improved aeration
bilaterally with no edema remaining. Mild atelectasis present at the
right lung base. Less prominent bilateral pleural effusions appear
small in volume. The heart size is stable.
IMPRESSION: Improved aeration, diminished appearance of bilateral pleural
effusions and no significant residual pulmonary edema.

## 2015-02-26 IMAGING — CR DG CHEST 1V PORT
1 series · 1 of 1 positions shown · non-contrast
Comparison: Chest x-ray 08/09/2013.

CLINICAL DATA: Evaluate for pneumonia.

EXAM:
PORTABLE CHEST - 1 VIEW

[AP]
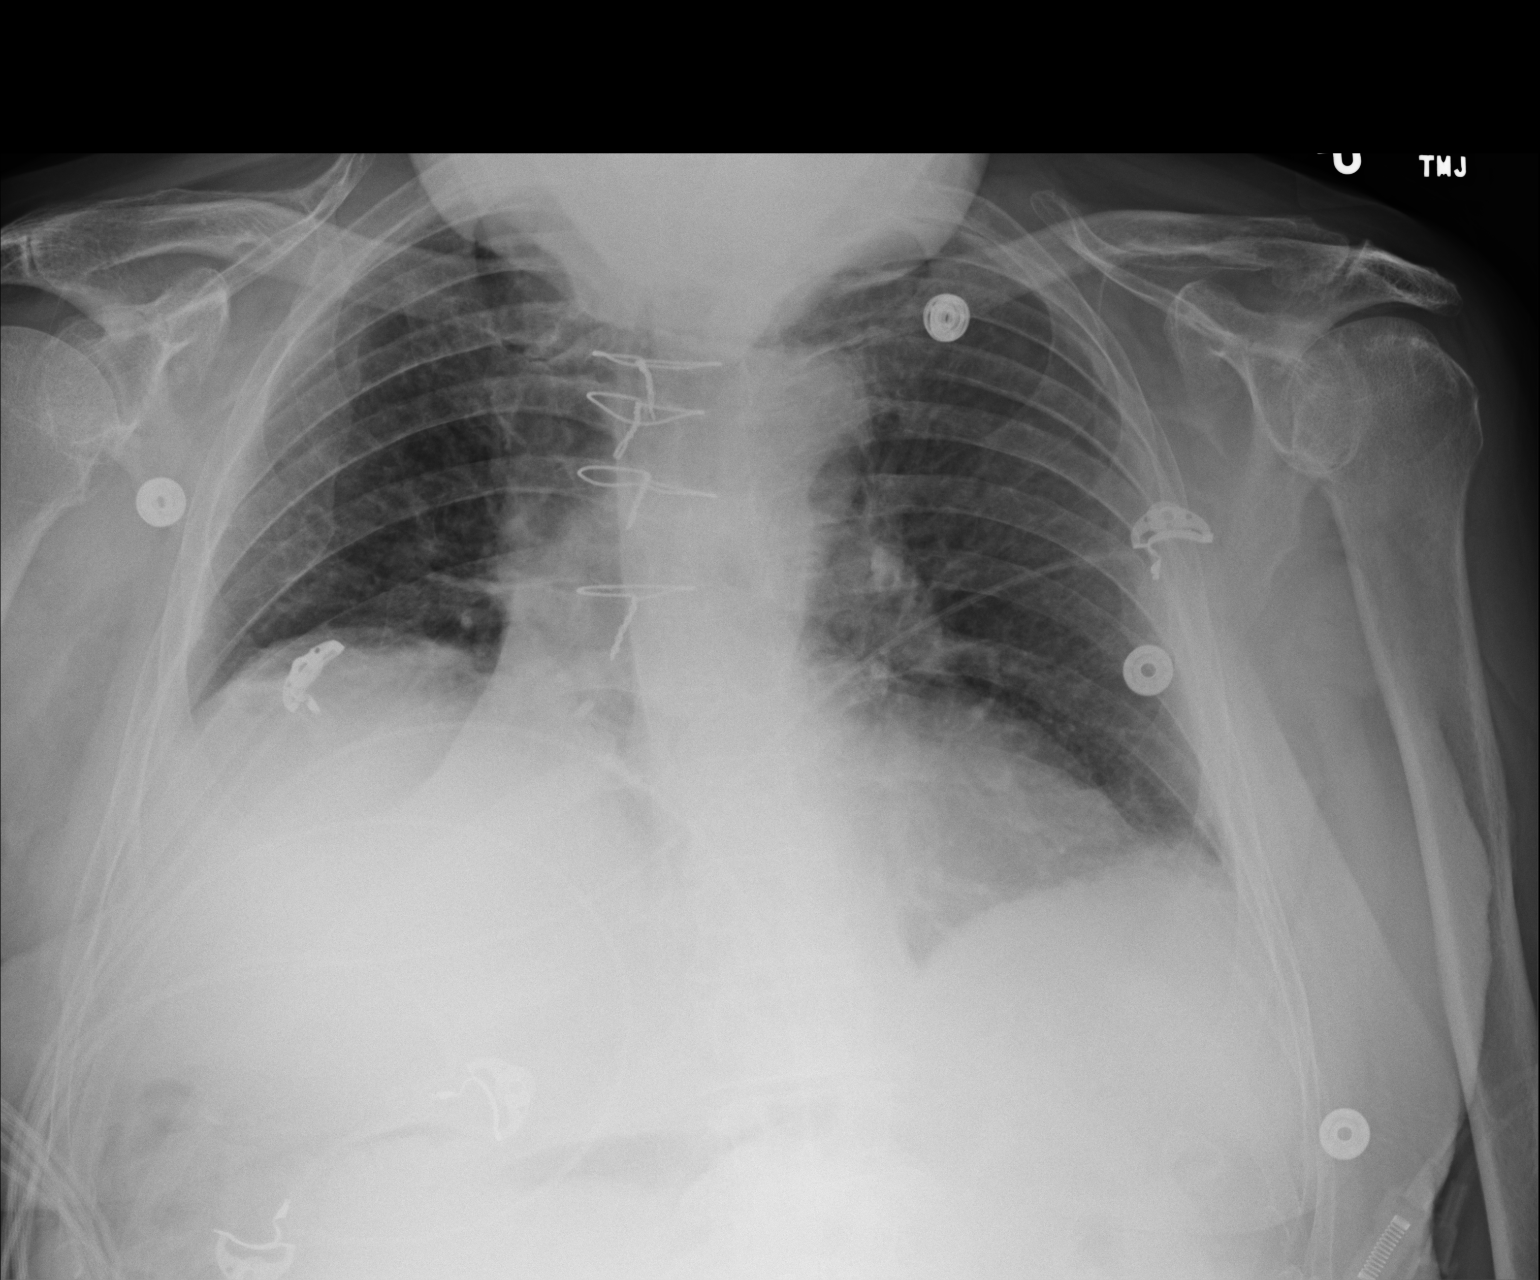

[1 of 1 positions shown; findings below may reference images not displayed]

FINDINGS: Lung volumes are low. Elevation of the right hemidiaphragm.
Bibasilar opacities favored to reflect subsegmental atelectasis. No
definite pleural effusions. No evidence of pulmonary edema. Heart
size appears mildly enlarged. Mediastinal contours are distorted by
patient positioning. Atherosclerosis in the thoracic aorta. Status
post he in the median sternotomy.
IMPRESSION: 1. Low lung volumes with elevated right hemidiaphragm, but no
definite findings of pneumonia on this portable examination. If
strong clinical concern for pneumonia persists, a repeat standing PA
and lateral chest radiograph during improved inspiratory effort
would be better suited to exclude possible lower lobe pneumonia.
2. Atherosclerosis.
3. Mild cardiomegaly.

## 2015-04-17 ENCOUNTER — Emergency Department (HOSPITAL_COMMUNITY)

## 2015-04-17 ENCOUNTER — Encounter (HOSPITAL_COMMUNITY): Payer: Self-pay | Admitting: Emergency Medicine

## 2015-04-17 ENCOUNTER — Emergency Department (HOSPITAL_COMMUNITY)
Admission: EM | Admit: 2015-04-17 | Discharge: 2015-04-18 | Disposition: A | Discharge status: Home / Self Care | Attending: Emergency Medicine | Admitting: Emergency Medicine

## 2015-04-17 DIAGNOSIS — Z8669 Personal history of other diseases of the nervous system and sense organs: Secondary | ICD-10-CM | POA: Diagnosis not present

## 2015-04-17 DIAGNOSIS — Z8619 Personal history of other infectious and parasitic diseases: Secondary | ICD-10-CM | POA: Diagnosis not present

## 2015-04-17 DIAGNOSIS — Z87891 Personal history of nicotine dependence: Secondary | ICD-10-CM | POA: Diagnosis not present

## 2015-04-17 DIAGNOSIS — R001 Bradycardia, unspecified: Secondary | ICD-10-CM | POA: Insufficient documentation

## 2015-04-17 DIAGNOSIS — M069 Rheumatoid arthritis, unspecified: Secondary | ICD-10-CM | POA: Diagnosis not present

## 2015-04-17 DIAGNOSIS — R600 Localized edema: Secondary | ICD-10-CM | POA: Insufficient documentation

## 2015-04-17 DIAGNOSIS — R609 Edema, unspecified: Secondary | ICD-10-CM

## 2015-04-17 DIAGNOSIS — R1084 Generalized abdominal pain: Secondary | ICD-10-CM | POA: Diagnosis not present

## 2015-04-17 DIAGNOSIS — R079 Chest pain, unspecified: Secondary | ICD-10-CM | POA: Insufficient documentation

## 2015-04-17 DIAGNOSIS — Z86010 Personal history of colonic polyps: Secondary | ICD-10-CM | POA: Diagnosis not present

## 2015-04-17 DIAGNOSIS — I129 Hypertensive chronic kidney disease with stage 1 through stage 4 chronic kidney disease, or unspecified chronic kidney disease: Secondary | ICD-10-CM | POA: Diagnosis not present

## 2015-04-17 DIAGNOSIS — Z880 Allergy status to penicillin: Secondary | ICD-10-CM | POA: Diagnosis not present

## 2015-04-17 DIAGNOSIS — Z8719 Personal history of other diseases of the digestive system: Secondary | ICD-10-CM | POA: Diagnosis not present

## 2015-04-17 DIAGNOSIS — N189 Chronic kidney disease, unspecified: Secondary | ICD-10-CM | POA: Diagnosis not present

## 2015-04-17 DIAGNOSIS — R531 Weakness: Secondary | ICD-10-CM | POA: Insufficient documentation

## 2015-04-17 DIAGNOSIS — Z79899 Other long term (current) drug therapy: Secondary | ICD-10-CM | POA: Insufficient documentation

## 2015-04-17 DIAGNOSIS — Z862 Personal history of diseases of the blood and blood-forming organs and certain disorders involving the immune mechanism: Secondary | ICD-10-CM | POA: Diagnosis not present

## 2015-04-17 DIAGNOSIS — M7989 Other specified soft tissue disorders: Secondary | ICD-10-CM | POA: Diagnosis present

## 2015-04-17 LAB — COMPREHENSIVE METABOLIC PANEL
ALT: 19 U/L (ref 17–63)
ANION GAP: 5 (ref 5–15)
AST: 17 U/L (ref 15–41)
Albumin: 2.6 g/dL — ABNORMAL LOW (ref 3.5–5.0)
Alkaline Phosphatase: 66 U/L (ref 38–126)
BUN: 61 mg/dL — AB (ref 6–20)
CO2: 33 mmol/L — ABNORMAL HIGH (ref 22–32)
CREATININE: 2.55 mg/dL — AB (ref 0.61–1.24)
Calcium: 10 mg/dL (ref 8.9–10.3)
Chloride: 104 mmol/L (ref 101–111)
GFR, EST AFRICAN AMERICAN: 25 mL/min — AB (ref >60)
GFR, EST NON AFRICAN AMERICAN: 22 mL/min — AB (ref >60)
GLUCOSE: 124 mg/dL — AB (ref 65–99)
Potassium: 4.2 mmol/L (ref 3.5–5.1)
Sodium: 142 mmol/L (ref 135–145)
TOTAL PROTEIN: 5.5 g/dL — AB (ref 6.5–8.1)
Total Bilirubin: 0.7 mg/dL (ref 0.3–1.2)

## 2015-04-17 LAB — URINALYSIS, ROUTINE W REFLEX MICROSCOPIC
BILIRUBIN URINE: NEGATIVE
Glucose, UA: NEGATIVE mg/dL
Ketones, ur: NEGATIVE mg/dL
NITRITE: NEGATIVE
Protein, ur: NEGATIVE mg/dL
SPECIFIC GRAVITY, URINE: 1.008 (ref 1.005–1.030)
UROBILINOGEN UA: 0.2 mg/dL (ref 0.0–1.0)
pH: 6.5 (ref 5.0–8.0)

## 2015-04-17 LAB — CBC WITH DIFFERENTIAL/PLATELET
BASOS ABS: 0.1 10*3/uL (ref 0.0–0.1)
Basophils Relative: 1 % (ref 0–1)
Eosinophils Absolute: 0.1 10*3/uL (ref 0.0–0.7)
Eosinophils Relative: 1 % (ref 0–5)
HCT: 32.7 % — ABNORMAL LOW (ref 39.0–52.0)
Hemoglobin: 9.8 g/dL — ABNORMAL LOW (ref 13.0–17.0)
LYMPHS PCT: 12 % (ref 12–46)
Lymphs Abs: 1.5 10*3/uL (ref 0.7–4.0)
MCH: 30.6 pg (ref 26.0–34.0)
MCHC: 30 g/dL (ref 30.0–36.0)
MCV: 102.2 fL — ABNORMAL HIGH (ref 78.0–100.0)
MONO ABS: 1.2 10*3/uL — AB (ref 0.1–1.0)
MONOS PCT: 10 % (ref 3–12)
NEUTROS PCT: 76 % (ref 43–77)
Neutro Abs: 9.2 10*3/uL — ABNORMAL HIGH (ref 1.7–7.7)
Platelets: 221 10*3/uL (ref 150–400)
RBC: 3.2 MIL/uL — AB (ref 4.22–5.81)
RDW: 16.5 % — ABNORMAL HIGH (ref 11.5–15.5)
WBC: 12.1 10*3/uL — ABNORMAL HIGH (ref 4.0–10.5)

## 2015-04-17 LAB — I-STAT CG4 LACTIC ACID, ED: Lactic Acid, Venous: 0.7 mmol/L (ref 0.5–2.0)

## 2015-04-17 LAB — URINE MICROSCOPIC-ADD ON

## 2015-04-17 MED ORDER — SODIUM CHLORIDE 0.9 % IV BOLUS (SEPSIS)
500.0000 mL | Freq: Once | INTRAVENOUS | Status: AC
  Administered 2015-04-17: 500 mL via INTRAVENOUS

## 2015-04-17 MED ORDER — SODIUM CHLORIDE 0.9 % IV SOLN
Freq: Once | INTRAVENOUS | Status: AC
  Administered 2015-04-17: 23:00:00 via INTRAVENOUS

## 2015-04-17 NOTE — ED Provider Notes (Signed)
CSN: [id number]     Arrival date & time 04/17/15  2135 History   First MD Initiated Contact with Patient 04/17/15 2143     Chief Complaint  Patient presents with  . Leg Swelling     (Consider location/radiation/quality/duration/timing/severity/associated sxs/prior Treatment) HPI Comments: This is an 81 year old male who lives at home with his family with the help of hospice directives that states IV fluids only and antiemetics if it will be like altering but specifically states no intubation or CPR. Daughter arrives with patient who states that he's had a decrease in mental status over the past 5 days since being started on Cipro.  She states that he's had problems with Cipro in the past causing confusion.  She states she is being given Cipro for a prostate irritation after being catheterized for a urine sample.  He also was started back on gabapentin but stopped 3 days later because this has caused confusion in the past. Daughter states that he has not been given Dilaudid for pain because it causes confusion been giving him Tylenol for any complaints.  He has of pain. He does continue to take an aspirin a day. He has been unable to get up out of the bed to perform ADLs per her norm for the past 3 days. Images and report any fever or chills, no nausea or vomiting or diarrhea.  The history is provided by a relative.    Past Medical History  Diagnosis Date  . Hypertension   . Arthritis   . Ex-smoker   . Rheumatoid arthritis(714.0)   . Pulmonary nodule   . DDD (degenerative disc disease)   . Complication of anesthesia     " I am difficult to wake up "  . Chronic kidney disease     acute 2013, 2014 on chronic stage 3.  . Anemia   . Proteus septicemia 02/2013  . Barrett's esophagus 04/2006    egd by dr Virginia Rochester.   . Colon polyp 04/2006    Hyperplastic on colonoscopy by Dr Virginia Rochester.   . Encephalopathy, metabolic 02/2013   Past Surgical History  Procedure Laterality Date  . Joint replacement       bilateral knee replacement  . Hernia repair    . Back surgery      X 2  . Appendectomy     Family History  Problem Relation Age of Onset  . Coronary artery disease Brother   . Hypertension Father   . Hypertension Mother    History  Substance Use Topics  . Smoking status: Former Smoker -- 0.50 packs/day for 30 years    Types: Cigarettes    Quit date: 10/12/1973  . Smokeless tobacco: Never Used  . Alcohol Use: No    Review of Systems  Constitutional: Positive for activity change. Negative for fever.  Respiratory: Negative for cough.   Cardiovascular: Positive for leg swelling.  Gastrointestinal: Negative for vomiting, diarrhea and constipation.  Musculoskeletal: Positive for arthralgias.  Skin: Positive for color change. Negative for wound.  Neurological: Positive for weakness.  All other systems reviewed and are negative.     Allergies  Ciprofloxacin; Codeine; Hydrocodone; Oxycodone; Lisinopril; Penicillins; and Sulfa antibiotics  Home Medications   Prior to Admission medications   Medication Sig Start Date End Date Taking? Authorizing Provider  albuterol (PROVENTIL) (2.5 MG/3ML) 0.083% nebulizer solution Take 2.5 mg by nebulization daily. For shortness of breath   Yes Historical Provider, MD  clotrimazole (LOTRIMIN) 1 % cream Apply 1 application topically 2 (  two) times daily.   Yes Historical Provider, MD  escitalopram (LEXAPRO) 5 MG tablet Take 20 mg by mouth daily.    Yes Historical Provider, MD  gentamicin (GARAMYCIN) 0.3 % ophthalmic solution Place 1 drop into both eyes every 4 (four) hours.   Yes Historical Provider, MD  guaifenesin (HUMIBID E) 400 MG TABS tablet Take 400 mg by mouth every 4 (four) hours.   Yes Historical Provider, MD  omeprazole (PRILOSEC) 40 MG capsule Take 40 mg by mouth daily.   Yes Historical Provider, MD  predniSONE (DELTASONE) 5 MG tablet Take 5 mg by mouth 2 (two) times daily with a meal.    Yes Historical Provider, MD  Skin  Protectants, Misc. (EUCERIN) cream Apply 1 application topically daily.   Yes Historical Provider, MD  Tamsulosin HCl (FLOMAX) 0.4 MG CAPS Take 1 capsule (0.4 mg total) by mouth daily after supper. 06/13/12  Yes Vassie Loll, MD  torsemide (DEMADEX) 20 MG tablet Take 40 mg by mouth every other day.    Yes Historical Provider, MD  ciprofloxacin (CIPRO) 500 MG tablet Take 500 mg by mouth daily with breakfast.    Historical Provider, MD  feeding supplement, ENSURE COMPLETE, (ENSURE COMPLETE) LIQD Take 237 mLs by mouth 2 (two) times daily between meals. Patient not taking: Reported on 09/29/2014 10/02/13   Nishant Dhungel, MD  gabapentin (NEURONTIN) 100 MG capsule Take 100 mg by mouth at bedtime.    Historical Provider, MD  HYDROmorphone (DILAUDID) 2 MG tablet Take 0.5 tablets (1 mg total) by mouth every 8 (eight) hours as needed for severe pain. Patient not taking: Reported on 09/29/2014 10/02/13   Nishant Dhungel, MD  lidocaine (LIDODERM) 5 % Place 1 patch onto the skin daily. Remove & Discard patch within 12 hours or as directed by MD Patient not taking: Reported on 09/29/2014 10/02/13   Nishant Dhungel, MD  lidocaine (LIDODERM) 5 % Place 1 patch onto the skin daily. Remove & Discard patch within 12 hours or as directed by MD 09/29/14   Azalia Bilis, MD  traMADol (ULTRAM) 50 MG tablet Take 1 tablet (50 mg total) by mouth every 6 (six) hours as needed for moderate pain. Patient not taking: Reported on 09/29/2014 10/02/13   Nishant Dhungel, MD   BP 113/48 mmHg  Pulse 60  Temp(Src) 97.9 F (36.6 C) (Oral)  Resp 15  SpO2 97% Physical Exam  Constitutional: He appears well-developed and well-nourished. No distress.  HENT:  Head: Normocephalic.  Right Ear: External ear normal.  Left Ear: External ear normal.  Lips appear dry mouth, is in poor hygienic condition.  Eyes: EOM are normal. Pupils are equal, round, and reactive to light.  Lid margins.  Reddened without drainage or discharge  Cardiovascular: Regular  rhythm.  Bradycardia present.   Pulmonary/Chest: Effort normal and breath sounds normal. No respiratory distress. He has no wheezes. He exhibits tenderness.  Abdominal: Soft. There is generalized tenderness.  Musculoskeletal: He exhibits edema and tenderness.  , And complains of pain with all touch to any part of the body. Toes bilaterally.  Discolored palpable pedal pulses, slight edema  Lymphadenopathy:    He has no cervical adenopathy.  Neurological: He is alert.  Skin: Skin is warm and dry. No rash noted.  Multiple ecchymotic area and pressure areas such as around the waist.  Wear pants with elastic band set ecchymotic areas and is shoulders.  Multiple areas on his forearms.  Nursing note and vitals reviewed.   ED Course  Procedures (including  critical care time) Labs Review Labs Reviewed  CBC WITH DIFFERENTIAL/PLATELET - Abnormal; Notable for the following:    WBC 12.1 (*)    RBC 3.20 (*)    Hemoglobin 9.8 (*)    HCT 32.7 (*)    MCV 102.2 (*)    RDW 16.5 (*)    Neutro Abs 9.2 (*)    Monocytes Absolute 1.2 (*)    All other components within normal limits  COMPREHENSIVE METABOLIC PANEL - Abnormal; Notable for the following:    CO2 33 (*)    Glucose, Bld 124 (*)    BUN 61 (*)    Creatinine, Ser 2.55 (*)    Total Protein 5.5 (*)    Albumin 2.6 (*)    GFR calc non Af Amer 22 (*)    GFR calc Af Amer 25 (*)    All other components within normal limits  URINALYSIS, ROUTINE W REFLEX MICROSCOPIC (NOT AT [facility]) - Abnormal; Notable for the following:    APPearance TURBID (*)    Hgb urine dipstick MODERATE (*)    Leukocytes, UA LARGE (*)    All other components within normal limits  URINE CULTURE  URINE MICROSCOPIC-ADD ON  I-STAT CG4 LACTIC ACID, ED    Imaging Review Dg Chest Portable 1 View  04/17/2015   CLINICAL DATA:  Altered mental status today.  EXAM: PORTABLE CHEST - 1 VIEW  COMPARISON:  09/20/2013  FINDINGS: Low lung volumes again demonstrated. Mild cardiomegaly is  stable. Mild scarring again seen in lung bases. No evidence of acute infiltrate or pleural effusion. No pneumothorax visualized. Prior median sternotomy noted.  IMPRESSION: Stable cardiomegaly, low lung volumes and bibasilar atelectasis. No acute findings.   Electronically Signed   By: Myles Rosenthal M.D.   On: 04/17/2015 22:26     EKG Interpretation None     labs have been reviewed.  I have requested a culture of the urine There are no changes.  X-ray is clear.  Patient has been given IV fluids seems to be marginally more alert.  This has been discussed with the patient's family, who agrees to take him home and continue care at home  MDM   Final diagnoses:  Peripheral edema         Earley Favor, NP 04/18/15 0022  Arby Barrette, MD 04/19/15 2010

## 2015-04-17 NOTE — ED Notes (Signed)
Gail, NP at bedside. 

## 2015-04-17 NOTE — ED Notes (Signed)
Per [name], Daughter: 3-4 nights has not slept, has been kinda out of it.  Weakness during day, requiring more help with ADL's.  Hospice nurses states his feet were blue.  States he has been talking out of his head and thinks it might be related to the cipro that he has been on for past few days.

## 2015-04-17 NOTE — ED Notes (Signed)
Per EMS:  Hospice patient with advance directive (IV fluids only) per EMS.  No copy of advance directive yet, family on the way.  Patient did not want to come, family insisted (per EMS).  Patient is not c/o pain, only bilateral leg swelling.  Feet are swollen and cold.  In afib. VSS, afebrile.

## 2015-04-18 NOTE — ED Notes (Signed)
[facility] contacted to tx patient home 

## 2015-04-18 NOTE — ED Notes (Signed)
[facility] called for transport.  

## 2015-04-18 NOTE — Discharge Instructions (Signed)
Peripheral Edema You have swelling in your legs (peripheral edema). This swelling is due to excess accumulation of salt and water in your body. Edema may be a sign of heart, kidney or liver disease, or a side effect of a medication. It may also be due to problems in the leg veins. Elevating your legs and using special support stockings may be very helpful, if the cause of the swelling is due to poor venous circulation. Avoid long periods of standing, whatever the cause. Treatment of edema depends on identifying the cause. Chips, pretzels, pickles and other salty foods should be avoided. Restricting salt in your diet is almost always needed. Water pills (diuretics) are often used to remove the excess salt and water from your body via urine. These medicines prevent the kidney from reabsorbing sodium. This increases urine flow. Diuretic treatment may also result in lowering of potassium levels in your body. Potassium supplements may be needed if you have to use diuretics daily. Daily weights can help you keep track of your progress in clearing your edema. You should call your caregiver for follow up care as recommended. SEEK IMMEDIATE MEDICAL CARE IF:   You have increased swelling, pain, redness, or heat in your legs.  You develop shortness of breath, especially when lying down.  You develop chest or abdominal pain, weakness, or fainting.  You have a fever. Document Released: 10/22/2004 Document Revised: 12/07/2011 Document Reviewed: 10/02/2009 [facility] Patient Information 2015 [place], [place]. This information is not intended to replace advice given to you by your health care provider. Make sure you discuss any questions you have with your health care provider. Follow-up with your primary care physician at the [facility]

## 2015-04-20 LAB — URINE CULTURE: SPECIAL REQUESTS: NORMAL

## 2015-04-21 ENCOUNTER — Telehealth (HOSPITAL_COMMUNITY): Payer: Self-pay

## 2015-04-21 NOTE — Telephone Encounter (Signed)
Post ED Visit - Positive Culture Follow-up  Culture report reviewed by antimicrobial stewardship pharmacist: []  Wes Dulaney, Pharm.D., BCPS []  , Pharm.D., BCPS [x]  , Pharm.D., BCPS []  [place], .D., BCPS, AAHIVP []  Georgina Pillion, Pharm.D., BCPS, AAHIVP []  , [place].D., BCPS  Positive Urine culture,  50,000 colonies -> Proteus Mirabilis Treated with Ciprofloxacin -> Resistant  Chart reviewed by E. West PA "No additional antibiotics needed -> hospice pt.  Only 50,000 colonies"  [place] 04/21/2015, 8:53 PM

## 2015-06-09 ENCOUNTER — Emergency Department (HOSPITAL_COMMUNITY)

## 2015-06-09 ENCOUNTER — Encounter (HOSPITAL_COMMUNITY): Payer: Self-pay | Admitting: Emergency Medicine

## 2015-06-09 ENCOUNTER — Inpatient Hospital Stay (HOSPITAL_COMMUNITY)
Admission: EM | Admit: 2015-06-09 | Discharge: 2015-06-13 | DRG: 872 | Disposition: A | Discharge status: Hospice | Attending: Internal Medicine | Admitting: Internal Medicine

## 2015-06-09 DIAGNOSIS — J9600 Acute respiratory failure, unspecified whether with hypoxia or hypercapnia: Secondary | ICD-10-CM | POA: Diagnosis not present

## 2015-06-09 DIAGNOSIS — J9612 Chronic respiratory failure with hypercapnia: Secondary | ICD-10-CM | POA: Diagnosis present

## 2015-06-09 DIAGNOSIS — I959 Hypotension, unspecified: Secondary | ICD-10-CM | POA: Diagnosis not present

## 2015-06-09 DIAGNOSIS — N183 Chronic kidney disease, stage 3 unspecified: Secondary | ICD-10-CM

## 2015-06-09 DIAGNOSIS — Z7952 Long term (current) use of systemic steroids: Secondary | ICD-10-CM

## 2015-06-09 DIAGNOSIS — Z8249 Family history of ischemic heart disease and other diseases of the circulatory system: Secondary | ICD-10-CM

## 2015-06-09 DIAGNOSIS — K2270 Barrett's esophagus without dysplasia: Secondary | ICD-10-CM | POA: Diagnosis present

## 2015-06-09 DIAGNOSIS — R29898 Other symptoms and signs involving the musculoskeletal system: Secondary | ICD-10-CM | POA: Diagnosis present

## 2015-06-09 DIAGNOSIS — Z515 Encounter for palliative care: Secondary | ICD-10-CM

## 2015-06-09 DIAGNOSIS — I129 Hypertensive chronic kidney disease with stage 1 through stage 4 chronic kidney disease, or unspecified chronic kidney disease: Secondary | ICD-10-CM | POA: Diagnosis present

## 2015-06-09 DIAGNOSIS — R6520 Severe sepsis without septic shock: Secondary | ICD-10-CM | POA: Diagnosis present

## 2015-06-09 DIAGNOSIS — I4891 Unspecified atrial fibrillation: Secondary | ICD-10-CM | POA: Diagnosis not present

## 2015-06-09 DIAGNOSIS — Z87891 Personal history of nicotine dependence: Secondary | ICD-10-CM

## 2015-06-09 DIAGNOSIS — Z951 Presence of aortocoronary bypass graft: Secondary | ICD-10-CM | POA: Diagnosis not present

## 2015-06-09 DIAGNOSIS — M069 Rheumatoid arthritis, unspecified: Secondary | ICD-10-CM | POA: Diagnosis present

## 2015-06-09 DIAGNOSIS — A419 Sepsis, unspecified organism: Secondary | ICD-10-CM | POA: Diagnosis not present

## 2015-06-09 DIAGNOSIS — N289 Disorder of kidney and ureter, unspecified: Secondary | ICD-10-CM | POA: Diagnosis not present

## 2015-06-09 DIAGNOSIS — D649 Anemia, unspecified: Secondary | ICD-10-CM | POA: Diagnosis present

## 2015-06-09 DIAGNOSIS — Z96653 Presence of artificial knee joint, bilateral: Secondary | ICD-10-CM | POA: Diagnosis present

## 2015-06-09 DIAGNOSIS — I248 Other forms of acute ischemic heart disease: Secondary | ICD-10-CM | POA: Diagnosis present

## 2015-06-09 DIAGNOSIS — Z885 Allergy status to narcotic agent status: Secondary | ICD-10-CM

## 2015-06-09 DIAGNOSIS — I471 Supraventricular tachycardia: Secondary | ICD-10-CM | POA: Diagnosis not present

## 2015-06-09 DIAGNOSIS — Z66 Do not resuscitate: Secondary | ICD-10-CM | POA: Diagnosis present

## 2015-06-09 DIAGNOSIS — A084 Viral intestinal infection, unspecified: Secondary | ICD-10-CM | POA: Diagnosis present

## 2015-06-09 DIAGNOSIS — J441 Chronic obstructive pulmonary disease with (acute) exacerbation: Secondary | ICD-10-CM | POA: Diagnosis present

## 2015-06-09 DIAGNOSIS — K219 Gastro-esophageal reflux disease without esophagitis: Secondary | ICD-10-CM | POA: Diagnosis present

## 2015-06-09 DIAGNOSIS — Z881 Allergy status to other antibiotic agents status: Secondary | ICD-10-CM

## 2015-06-09 DIAGNOSIS — Z87440 Personal history of urinary (tract) infections: Secondary | ICD-10-CM | POA: Diagnosis not present

## 2015-06-09 DIAGNOSIS — J9692 Respiratory failure, unspecified with hypercapnia: Secondary | ICD-10-CM

## 2015-06-09 DIAGNOSIS — Z880 Allergy status to penicillin: Secondary | ICD-10-CM

## 2015-06-09 DIAGNOSIS — R531 Weakness: Secondary | ICD-10-CM | POA: Diagnosis present

## 2015-06-09 DIAGNOSIS — N400 Enlarged prostate without lower urinary tract symptoms: Secondary | ICD-10-CM | POA: Diagnosis present

## 2015-06-09 DIAGNOSIS — R079 Chest pain, unspecified: Secondary | ICD-10-CM | POA: Diagnosis present

## 2015-06-09 DIAGNOSIS — R7989 Other specified abnormal findings of blood chemistry: Secondary | ICD-10-CM | POA: Diagnosis not present

## 2015-06-09 DIAGNOSIS — R000 Tachycardia, unspecified: Secondary | ICD-10-CM | POA: Diagnosis present

## 2015-06-09 DIAGNOSIS — J9691 Respiratory failure, unspecified with hypoxia: Secondary | ICD-10-CM | POA: Insufficient documentation

## 2015-06-09 DIAGNOSIS — Z888 Allergy status to other drugs, medicaments and biological substances status: Secondary | ICD-10-CM

## 2015-06-09 DIAGNOSIS — N390 Urinary tract infection, site not specified: Secondary | ICD-10-CM | POA: Diagnosis present

## 2015-06-09 DIAGNOSIS — Z882 Allergy status to sulfonamides status: Secondary | ICD-10-CM | POA: Diagnosis not present

## 2015-06-09 DIAGNOSIS — F419 Anxiety disorder, unspecified: Secondary | ICD-10-CM | POA: Diagnosis present

## 2015-06-09 DIAGNOSIS — I2510 Atherosclerotic heart disease of native coronary artery without angina pectoris: Secondary | ICD-10-CM | POA: Diagnosis present

## 2015-06-09 DIAGNOSIS — N179 Acute kidney failure, unspecified: Secondary | ICD-10-CM | POA: Diagnosis present

## 2015-06-09 DIAGNOSIS — E872 Acidosis, unspecified: Secondary | ICD-10-CM | POA: Diagnosis present

## 2015-06-09 DIAGNOSIS — F329 Major depressive disorder, single episode, unspecified: Secondary | ICD-10-CM | POA: Diagnosis present

## 2015-06-09 DIAGNOSIS — I9589 Other hypotension: Secondary | ICD-10-CM | POA: Diagnosis not present

## 2015-06-09 DIAGNOSIS — J9611 Chronic respiratory failure with hypoxia: Secondary | ICD-10-CM | POA: Diagnosis present

## 2015-06-09 DIAGNOSIS — J449 Chronic obstructive pulmonary disease, unspecified: Secondary | ICD-10-CM | POA: Insufficient documentation

## 2015-06-09 DIAGNOSIS — R0789 Other chest pain: Secondary | ICD-10-CM | POA: Diagnosis present

## 2015-06-09 DIAGNOSIS — Z9981 Dependence on supplemental oxygen: Secondary | ICD-10-CM | POA: Diagnosis not present

## 2015-06-09 DIAGNOSIS — J189 Pneumonia, unspecified organism: Secondary | ICD-10-CM | POA: Insufficient documentation

## 2015-06-09 DIAGNOSIS — R778 Other specified abnormalities of plasma proteins: Secondary | ICD-10-CM

## 2015-06-09 DIAGNOSIS — E861 Hypovolemia: Secondary | ICD-10-CM | POA: Diagnosis present

## 2015-06-09 HISTORY — DX: Chronic obstructive pulmonary disease, unspecified: J44.9

## 2015-06-09 LAB — CBC WITH DIFFERENTIAL/PLATELET
BASOS PCT: 0 % (ref 0–1)
Basophils Absolute: 0 10*3/uL (ref 0.0–0.1)
EOS ABS: 0.3 10*3/uL (ref 0.0–0.7)
Eosinophils Relative: 4 % (ref 0–5)
HEMATOCRIT: 41.6 % (ref 39.0–52.0)
Hemoglobin: 12.5 g/dL — ABNORMAL LOW (ref 13.0–17.0)
LYMPHS ABS: 0.3 10*3/uL — AB (ref 0.7–4.0)
Lymphocytes Relative: 3 % — ABNORMAL LOW (ref 12–46)
MCH: 29 pg (ref 26.0–34.0)
MCHC: 30 g/dL (ref 30.0–36.0)
MCV: 96.5 fL (ref 78.0–100.0)
MONO ABS: 0.4 10*3/uL (ref 0.1–1.0)
MONOS PCT: 5 % (ref 3–12)
Neutro Abs: 7.1 10*3/uL (ref 1.7–7.7)
Neutrophils Relative %: 88 % — ABNORMAL HIGH (ref 43–77)
Platelets: 172 10*3/uL (ref 150–400)
RBC: 4.31 MIL/uL (ref 4.22–5.81)
RDW: 15.2 % (ref 11.5–15.5)
WBC: 8.1 10*3/uL (ref 4.0–10.5)

## 2015-06-09 LAB — URINE MICROSCOPIC-ADD ON

## 2015-06-09 LAB — COMPREHENSIVE METABOLIC PANEL
ALBUMIN: 2.9 g/dL — AB (ref 3.5–5.0)
ALK PHOS: 90 U/L (ref 38–126)
ALT: 30 U/L (ref 17–63)
AST: 41 U/L (ref 15–41)
Anion gap: 14 (ref 5–15)
BUN: 48 mg/dL — ABNORMAL HIGH (ref 6–20)
CALCIUM: 10.1 mg/dL (ref 8.9–10.3)
CO2: 28 mmol/L (ref 22–32)
CREATININE: 2.93 mg/dL — AB (ref 0.61–1.24)
Chloride: 101 mmol/L (ref 101–111)
GFR calc Af Amer: 21 mL/min — ABNORMAL LOW (ref >60)
GFR calc non Af Amer: 18 mL/min — ABNORMAL LOW (ref >60)
GLUCOSE: 84 mg/dL (ref 65–99)
Potassium: 3.7 mmol/L (ref 3.5–5.1)
SODIUM: 143 mmol/L (ref 135–145)
Total Bilirubin: 1.3 mg/dL — ABNORMAL HIGH (ref 0.3–1.2)
Total Protein: 5.5 g/dL — ABNORMAL LOW (ref 6.5–8.1)

## 2015-06-09 LAB — URINALYSIS, ROUTINE W REFLEX MICROSCOPIC
BILIRUBIN URINE: NEGATIVE
GLUCOSE, UA: NEGATIVE mg/dL
Ketones, ur: NEGATIVE mg/dL
Nitrite: POSITIVE — AB
Protein, ur: NEGATIVE mg/dL
SPECIFIC GRAVITY, URINE: 1.013 (ref 1.005–1.030)
UROBILINOGEN UA: 0.2 mg/dL (ref 0.0–1.0)
pH: 5 (ref 5.0–8.0)

## 2015-06-09 LAB — MAGNESIUM: MAGNESIUM: 1.4 mg/dL — AB (ref 1.7–2.4)

## 2015-06-09 LAB — TROPONIN I
TROPONIN I: 0.16 ng/mL — AB (ref <0.031)
Troponin I: 0.1 ng/mL — ABNORMAL HIGH (ref <0.031)
Troponin I: 0.16 ng/mL — ABNORMAL HIGH (ref <0.031)

## 2015-06-09 LAB — MRSA PCR SCREENING: MRSA BY PCR: NEGATIVE

## 2015-06-09 LAB — I-STAT CG4 LACTIC ACID, ED
LACTIC ACID, VENOUS: 1.62 mmol/L (ref 0.5–2.0)
Lactic Acid, Venous: 2.71 mmol/L (ref 0.5–2.0)
Lactic Acid, Venous: 1.1 mmol/L (ref 0.5–2.0)

## 2015-06-09 LAB — PROCALCITONIN: PROCALCITONIN: 2.72 ng/mL

## 2015-06-09 LAB — LACTIC ACID, PLASMA
LACTIC ACID, VENOUS: 1 mmol/L (ref 0.5–2.0)
Lactic Acid, Venous: 0.7 mmol/L (ref 0.5–2.0)

## 2015-06-09 LAB — APTT: APTT: 27 s (ref 24–37)

## 2015-06-09 LAB — PROTIME-INR
INR: 1.15 (ref 0.00–1.49)
PROTHROMBIN TIME: 14.9 s (ref 11.6–15.2)

## 2015-06-09 MED ORDER — ONDANSETRON HCL 4 MG PO TABS: 4.0000 mg | ORAL_TABLET | Freq: Four times a day (QID) | ORAL | Status: DC | PRN

## 2015-06-09 MED ORDER — IPRATROPIUM BROMIDE 0.02 % IN SOLN
0.5000 mg | Freq: Four times a day (QID) | RESPIRATORY_TRACT | Status: DC
  Administered 2015-06-09 – 2015-06-11 (×7): 0.5 mg via RESPIRATORY_TRACT
  Filled 2015-06-09 (×7): qty 2.5

## 2015-06-09 MED ORDER — HEPARIN SODIUM (PORCINE) 5000 UNIT/ML IJ SOLN
5000.0000 [IU] | Freq: Three times a day (TID) | INTRAMUSCULAR | Status: DC
Start: 2015-06-09 — End: 2015-06-12
  Administered 2015-06-09 – 2015-06-12 (×9): 5000 [IU] via SUBCUTANEOUS
  Filled 2015-06-09 (×9): qty 1

## 2015-06-09 MED ORDER — HYDROCORTISONE NA SUCCINATE PF 100 MG IJ SOLR: 100.0000 mg | Freq: Four times a day (QID) | INTRAMUSCULAR | Status: DC

## 2015-06-09 MED ORDER — VANCOMYCIN HCL 10 G IV SOLR
1250.0000 mg | Freq: Once | INTRAVENOUS | Status: AC
  Administered 2015-06-09: 1250 mg via INTRAVENOUS
  Filled 2015-06-09 (×2): qty 1250

## 2015-06-09 MED ORDER — DEXTROSE 5 % IV SOLN
2.0000 g | Freq: Once | INTRAVENOUS | Status: AC
  Administered 2015-06-09: 2 g via INTRAVENOUS
  Filled 2015-06-09: qty 2

## 2015-06-09 MED ORDER — ESCITALOPRAM OXALATE 20 MG PO TABS
20.0000 mg | ORAL_TABLET | Freq: Every day | ORAL | Status: DC
  Administered 2015-06-09 – 2015-06-11 (×3): 20 mg via ORAL
  Filled 2015-06-09: qty 1
  Filled 2015-06-09 (×3): qty 2

## 2015-06-09 MED ORDER — LEVALBUTEROL HCL 0.63 MG/3ML IN NEBU
0.6300 mg | INHALATION_SOLUTION | Freq: Four times a day (QID) | RESPIRATORY_TRACT | Status: DC
  Administered 2015-06-09 – 2015-06-11 (×7): 0.63 mg via RESPIRATORY_TRACT
  Filled 2015-06-09 (×7): qty 3

## 2015-06-09 MED ORDER — SODIUM CHLORIDE 0.9 % IV BOLUS (SEPSIS)
500.0000 mL | INTRAVENOUS | Status: AC
  Administered 2015-06-09: 500 mL via INTRAVENOUS

## 2015-06-09 MED ORDER — GI COCKTAIL ~~LOC~~
30.0000 mL | Freq: Three times a day (TID) | ORAL | Status: DC | PRN
  Administered 2015-06-09: 30 mL via ORAL
  Filled 2015-06-09: qty 30

## 2015-06-09 MED ORDER — SODIUM CHLORIDE 0.9 % IV SOLN
INTRAVENOUS | Status: DC
  Administered 2015-06-10: 1000 mL via INTRAVENOUS
  Administered 2015-06-10 – 2015-06-12 (×3): via INTRAVENOUS

## 2015-06-09 MED ORDER — TAMSULOSIN HCL 0.4 MG PO CAPS
0.4000 mg | ORAL_CAPSULE | Freq: Every day | ORAL | Status: DC
  Administered 2015-06-09 – 2015-06-11 (×3): 0.4 mg via ORAL
  Filled 2015-06-09 (×4): qty 1

## 2015-06-09 MED ORDER — ONDANSETRON HCL 4 MG/2ML IJ SOLN: 4.0000 mg | Freq: Four times a day (QID) | INTRAMUSCULAR | Status: DC | PRN

## 2015-06-09 MED ORDER — IPRATROPIUM BROMIDE 0.02 % IN SOLN
0.5000 mg | RESPIRATORY_TRACT | Status: DC | PRN
  Administered 2015-06-12: 0.5 mg via RESPIRATORY_TRACT
  Filled 2015-06-09: qty 2.5

## 2015-06-09 MED ORDER — SODIUM CHLORIDE 0.9 % IV SOLN
INTRAVENOUS | Status: AC
  Administered 2015-06-09 – 2015-06-10 (×2): via INTRAVENOUS

## 2015-06-09 MED ORDER — SODIUM CHLORIDE 0.9 % IV BOLUS (SEPSIS)
1000.0000 mL | INTRAVENOUS | Status: AC
  Administered 2015-06-09 (×2): 1000 mL via INTRAVENOUS

## 2015-06-09 MED ORDER — TRAZODONE HCL 50 MG PO TABS: 25.0000 mg | ORAL_TABLET | Freq: Every evening | ORAL | Status: DC | PRN

## 2015-06-09 MED ORDER — SODIUM CHLORIDE 0.9 % IJ SOLN
3.0000 mL | Freq: Two times a day (BID) | INTRAMUSCULAR | Status: DC
  Administered 2015-06-09 – 2015-06-12 (×4): 3 mL via INTRAVENOUS

## 2015-06-09 MED ORDER — HALOPERIDOL 0.5 MG PO TABS
0.5000 mg | ORAL_TABLET | Freq: Three times a day (TID) | ORAL | Status: DC | PRN
  Filled 2015-06-09: qty 1

## 2015-06-09 MED ORDER — VANCOMYCIN HCL IN DEXTROSE 1-5 GM/200ML-% IV SOLN
1000.0000 mg | INTRAVENOUS | Status: DC
  Administered 2015-06-11: 1000 mg via INTRAVENOUS
  Filled 2015-06-09: qty 200

## 2015-06-09 MED ORDER — SODIUM CHLORIDE 0.9 % IV SOLN: INTRAVENOUS | Status: AC

## 2015-06-09 MED ORDER — ASPIRIN EC 325 MG PO TBEC: 325.0000 mg | DELAYED_RELEASE_TABLET | Freq: Every day | ORAL | Status: DC

## 2015-06-09 MED ORDER — ASPIRIN 325 MG PO TABS
325.0000 mg | ORAL_TABLET | Freq: Once | ORAL | Status: AC
  Administered 2015-06-09: 325 mg via ORAL
  Filled 2015-06-09: qty 1

## 2015-06-09 MED ORDER — VANCOMYCIN HCL IN DEXTROSE 1-5 GM/200ML-% IV SOLN
1000.0000 mg | Freq: Once | INTRAVENOUS | Status: DC
Start: 2015-06-09 — End: 2015-06-09

## 2015-06-09 MED ORDER — CETYLPYRIDINIUM CHLORIDE 0.05 % MT LIQD
7.0000 mL | Freq: Two times a day (BID) | OROMUCOSAL | Status: DC
  Administered 2015-06-09 – 2015-06-13 (×8): 7 mL via OROMUCOSAL

## 2015-06-09 MED ORDER — ACETAMINOPHEN 325 MG PO TABS
650.0000 mg | ORAL_TABLET | Freq: Four times a day (QID) | ORAL | Status: DC | PRN
  Administered 2015-06-11 (×2): 650 mg via ORAL
  Filled 2015-06-09 (×2): qty 2

## 2015-06-09 MED ORDER — FAMOTIDINE IN NACL 20-0.9 MG/50ML-% IV SOLN
20.0000 mg | INTRAVENOUS | Status: DC
  Administered 2015-06-09: 20 mg via INTRAVENOUS
  Filled 2015-06-09: qty 50

## 2015-06-09 MED ORDER — PIPERACILLIN-TAZOBACTAM 3.375 G IVPB 30 MIN: 3.3750 g | Freq: Once | INTRAVENOUS | Status: DC

## 2015-06-09 MED ORDER — GUAIFENESIN 400 MG PO TABS
400.0000 mg | ORAL_TABLET | ORAL | Status: DC
  Administered 2015-06-09 – 2015-06-11 (×13): 400 mg via ORAL
  Filled 2015-06-09 (×22): qty 1

## 2015-06-09 MED ORDER — HYDROCERIN EX CREA
TOPICAL_CREAM | Freq: Every day | CUTANEOUS | Status: DC
  Administered 2015-06-09: 18:00:00 via TOPICAL
  Administered 2015-06-10 – 2015-06-11 (×2): 1 via TOPICAL
  Administered 2015-06-12 – 2015-06-13 (×2): via TOPICAL
  Filled 2015-06-09: qty 113

## 2015-06-09 MED ORDER — ACETAMINOPHEN 650 MG RE SUPP
650.0000 mg | Freq: Four times a day (QID) | RECTAL | Status: DC | PRN
  Filled 2015-06-09: qty 1

## 2015-06-09 MED ORDER — VANCOMYCIN 50 MG/ML ORAL SOLUTION
250.0000 mg | Freq: Four times a day (QID) | ORAL | Status: DC
  Administered 2015-06-09 – 2015-06-11 (×8): 250 mg via ORAL
  Filled 2015-06-09 (×13): qty 5

## 2015-06-09 MED ORDER — PANTOPRAZOLE SODIUM 40 MG IV SOLR
40.0000 mg | Freq: Every day | INTRAVENOUS | Status: DC
  Administered 2015-06-09: 40 mg via INTRAVENOUS
  Filled 2015-06-09: qty 40

## 2015-06-09 MED ORDER — GABAPENTIN 100 MG PO CAPS
100.0000 mg | ORAL_CAPSULE | Freq: Every day | ORAL | Status: DC
  Administered 2015-06-09 – 2015-06-11 (×3): 100 mg via ORAL
  Filled 2015-06-09 (×3): qty 1

## 2015-06-09 MED ORDER — DEXTROSE 5 % IV SOLN
2.0000 g | INTRAVENOUS | Status: DC
  Administered 2015-06-10 – 2015-06-12 (×3): 2 g via INTRAVENOUS
  Filled 2015-06-09 (×3): qty 2

## 2015-06-09 MED ORDER — TOBRAMYCIN 0.3 % OP SOLN
1.0000 [drp] | OPHTHALMIC | Status: DC
  Administered 2015-06-09 – 2015-06-12 (×17): 1 [drp] via OPHTHALMIC
  Filled 2015-06-09: qty 5

## 2015-06-09 MED ORDER — SODIUM CHLORIDE 0.9 % IV BOLUS (SEPSIS)
500.0000 mL | Freq: Once | INTRAVENOUS | Status: AC
  Administered 2015-06-09: 500 mL via INTRAVENOUS

## 2015-06-09 MED ORDER — SODIUM CHLORIDE 0.9 % IV BOLUS (SEPSIS)
500.0000 mL | INTRAVENOUS | Status: DC | PRN
  Administered 2015-06-10: 500 mL via INTRAVENOUS
  Filled 2015-06-09: qty 500

## 2015-06-09 MED ORDER — BUDESONIDE 0.5 MG/2ML IN SUSP
0.5000 mg | Freq: Two times a day (BID) | RESPIRATORY_TRACT | Status: DC
  Administered 2015-06-09 – 2015-06-13 (×8): 0.5 mg via RESPIRATORY_TRACT
  Filled 2015-06-09 (×8): qty 2

## 2015-06-09 MED ORDER — SODIUM CHLORIDE 0.9 % IV SOLN
INTRAVENOUS | Status: DC
  Administered 2015-06-09: 125 mL/h via INTRAVENOUS

## 2015-06-09 MED ORDER — ARFORMOTEROL TARTRATE 15 MCG/2ML IN NEBU
15.0000 ug | INHALATION_SOLUTION | Freq: Two times a day (BID) | RESPIRATORY_TRACT | Status: DC
  Administered 2015-06-09 – 2015-06-13 (×7): 15 ug via RESPIRATORY_TRACT
  Filled 2015-06-09 (×11): qty 2

## 2015-06-09 MED ORDER — EUCERIN EX CREA
1.0000 | TOPICAL_CREAM | Freq: Every day | CUTANEOUS | Status: DC
Start: 2015-06-09 — End: 2015-06-09

## 2015-06-09 MED ORDER — HYDROCORTISONE NA SUCCINATE PF 100 MG IJ SOLR
100.0000 mg | Freq: Three times a day (TID) | INTRAMUSCULAR | Status: DC
  Administered 2015-06-09 – 2015-06-12 (×10): 100 mg via INTRAVENOUS
  Filled 2015-06-09 (×10): qty 2

## 2015-06-09 MED ORDER — LEVALBUTEROL HCL 0.63 MG/3ML IN NEBU
0.6300 mg | INHALATION_SOLUTION | RESPIRATORY_TRACT | Status: DC | PRN
  Administered 2015-06-12: 0.63 mg via RESPIRATORY_TRACT
  Filled 2015-06-09: qty 3

## 2015-06-09 MED ORDER — DILTIAZEM HCL 25 MG/5ML IV SOLN
10.0000 mg | Freq: Once | INTRAVENOUS | Status: AC
  Administered 2015-06-09: 10 mg via INTRAVENOUS
  Filled 2015-06-09: qty 5

## 2015-06-09 NOTE — ED Provider Notes (Signed)
CSN: [id number]     Arrival date & time 06/09/15  0902 History   First MD Initiated Contact with Patient 06/09/15 [phone]     Chief Complaint  Patient presents with  . Chest Pain     (Consider location/radiation/quality/duration/timing/severity/associated sxs/prior Treatment) The history is provided by the patient.  [name] is a 81 y.o. male hx of HTN, rheumatoid arthritis, CKD, end stage COPD on hospice, DNR here with chest pain. Patient is on home hospice. 3 days ago, patient was picked up by his family from hospital bed to the bedside commode and thought that they may have broken some ribs. However no x-rays were done. Had some nausea and chest pain today in the upper part of his chest. Denies any fevers but has chronic cough. Denies any abdominal pain. Patient DNR but wants IVF, IV abx, and pressors.   Past Medical History  Diagnosis Date  . Hypertension   . Arthritis   . Ex-smoker   . Rheumatoid arthritis(714.0)   . Pulmonary nodule   . DDD (degenerative disc disease)   . Complication of anesthesia     " I am difficult to wake up "  . Chronic kidney disease     acute 2013, 2014 on chronic stage 3.  . Anemia   . Proteus septicemia 02/2013  . Barrett's esophagus 04/2006    egd by dr Virginia Rochester.   . Colon polyp 04/2006    Hyperplastic on colonoscopy by Dr Virginia Rochester.   . Encephalopathy, metabolic 02/2013  . COPD (chronic obstructive pulmonary disease)    Past Surgical History  Procedure Laterality Date  . Joint replacement      bilateral knee replacement  . Hernia repair    . Back surgery      X 2  . Appendectomy     Family History  Problem Relation Age of Onset  . Coronary artery disease Brother   . Hypertension Father   . Hypertension Mother    Social History  Substance Use Topics  . Smoking status: Former Smoker -- 0.50 packs/day for 30 years    Types: Cigarettes    Quit date: 10/12/1973  . Smokeless tobacco: Never Used  . Alcohol Use: No    Review of Systems   Cardiovascular: Positive for chest pain.  All other systems reviewed and are negative.     Allergies  Ciprofloxacin; Codeine; Hydrocodone; Oxycodone; Lisinopril; Penicillins; and Sulfa antibiotics  Home Medications   Prior to Admission medications   Medication Sig Start Date End Date Taking? Authorizing Provider  albuterol (PROVENTIL) (2.5 MG/3ML) 0.083% nebulizer solution Take 2.5 mg by nebulization every 6 (six) hours as needed for wheezing or shortness of breath. For shortness of breath   Yes Historical Provider, MD  clotrimazole (LOTRIMIN) 1 % cream Apply 1 application topically 2 (two) times daily.   Yes Historical Provider, MD  escitalopram (LEXAPRO) 5 MG tablet Take 20 mg by mouth daily.    Yes Historical Provider, MD  gabapentin (NEURONTIN) 100 MG capsule Take 100 mg by mouth at bedtime.   Yes Historical Provider, MD  guaifenesin (HUMIBID E) 400 MG TABS tablet Take 400 mg by mouth every 4 (four) hours.   Yes Historical Provider, MD  haloperidol (HALDOL) 0.5 MG tablet Take 0.5 mg by mouth every 8 (eight) hours as needed for agitation.   Yes Historical Provider, MD  Loperamide HCl (IMODIUM A-D PO) Take 1 tablet by mouth as needed (loose stool).   Yes Historical Provider, MD  omeprazole (  PRILOSEC) 40 MG capsule Take 40 mg by mouth daily.   Yes Historical Provider, MD  predniSONE (DELTASONE) 5 MG tablet Take 5 mg by mouth 2 (two) times daily with a meal.    Yes Historical Provider, MD  Skin Protectants, Misc. (EUCERIN) cream Apply 1 application topically daily.   Yes Historical Provider, MD  Tamsulosin HCl (FLOMAX) 0.4 MG CAPS Take 1 capsule (0.4 mg total) by mouth daily after supper. 06/13/12  Yes Vassie Loll, MD  torsemide (DEMADEX) 20 MG tablet Take 40 mg by mouth every other day.    Yes Historical Provider, MD  gentamicin (GARAMYCIN) 0.3 % ophthalmic solution Place 1 drop into both eyes every 4 (four) hours.    Historical Provider, MD   BP 113/62 mmHg  Pulse 60  Temp(Src) 97.4  F (36.3 C) (Oral)  Resp 21  Ht 5\' 5"  (1.651 m)  SpO2 87% Physical Exam  Constitutional: He is oriented to person, place, and time.  Chronically ill appearing   HENT:  Head: Normocephalic.  Mouth/Throat: Oropharynx is clear and moist.  Eyes: Conjunctivae are normal. Pupils are equal, round, and reactive to light.  Neck: Normal range of motion. Neck supple.  Cardiovascular: Normal rate, regular rhythm and normal heart sounds.   Pulmonary/Chest:  Diminished throughout. No obvious ecchymosis.   Abdominal: Soft. Bowel sounds are normal. He exhibits no distension. There is no tenderness. There is no rebound.  Musculoskeletal: Normal range of motion. He exhibits no edema or tenderness.  Neurological: He is alert and oriented to person, place, and time. No cranial nerve deficit. Coordination normal.  Skin: Skin is warm and dry.  Psychiatric: He has a normal mood and affect. His behavior is normal. Judgment and thought content normal.  Nursing note and vitals reviewed.   ED Course  Procedures (including critical care time)  CRITICAL CARE Performed by: , Idaliz Tinkle   Total critical care time: 30 min   Critical care time was exclusive of separately billable procedures and treating other patients.  Critical care was necessary to treat or prevent imminent or life-threatening deterioration.  Critical care was time spent personally by me on the following activities: development of treatment plan with patient and/or surrogate as well as nursing, discussions with consultants, evaluation of patient's response to treatment, examination of patient, obtaining history from patient or surrogate, ordering and performing treatments and interventions, ordering and review of laboratory studies, ordering and review of radiographic studies, pulse oximetry and re-evaluation of patient's condition.   Labs Review Labs Reviewed  COMPREHENSIVE METABOLIC PANEL - Abnormal; Notable for the following:    BUN 48  (*)    Creatinine, Ser 2.93 (*)    Total Protein 5.5 (*)    Albumin 2.9 (*)    Total Bilirubin 1.3 (*)    GFR calc non Af Amer 18 (*)    GFR calc Af Amer 21 (*)    All other components within normal limits  CBC WITH DIFFERENTIAL/PLATELET - Abnormal; Notable for the following:    Hemoglobin 12.5 (*)    Neutrophils Relative % 88 (*)    Lymphocytes Relative 3 (*)    Lymphs Abs 0.3 (*)    All other components within normal limits  TROPONIN I - Abnormal; Notable for the following:    Troponin I 0.10 (*)    All other components within normal limits  I-STAT CG4 LACTIC ACID, ED - Abnormal; Notable for the following:    Lactic Acid, Venous 2.71 (*)    All other  components within normal limits  CULTURE, BLOOD (ROUTINE X 2)  CULTURE, BLOOD (ROUTINE X 2)  URINE CULTURE  URINALYSIS, ROUTINE W REFLEX MICROSCOPIC (NOT AT [facility])  I-STAT CG4 LACTIC ACID, ED  I-STAT CG4 LACTIC ACID, ED    Imaging Review Dg Chest Port 1 View  06/09/2015   CLINICAL DATA:  Chest pain for 4 days.  EXAM: PORTABLE CHEST - 1 VIEW  COMPARISON:  04/17/2015  FINDINGS: Previous median sternotomy and CABG procedure. Mild cardiac enlargement. Lung volumes are suboptimally inflated. Bibasilar atelectasis again noted.  IMPRESSION: 1. Similar appearance of low lung volumes and bibasilar atelectasis.   Electronically Signed   By: Signa Kell M.D.   On: 06/09/2015 10:04   I have personally reviewed and evaluated these images and lab results as part of my medical decision-making.   EKG Interpretation   Date/Time:  Sunday June 09 2015 09:06:02 EDT Ventricular Rate:  116 PR Interval:  175 QRS Duration: 105 QT Interval:  327 QTC Calculation: 454 R Axis:   -57 Text Interpretation:  rapid afib Multiple premature complexes, vent  Left  anterior fascicular block LVH with secondary repolarization abnormality  Anterior Q waves, possibly due to LVH Baseline wander in lead(s) V1 rapid  afib unchanged from previous poor  baseline  Confirmed by Adylin Hankey  MD, Samaa Ueda  ([id number]) on 06/09/2015 9:31:05 AM      MDM   Final diagnoses:  None   [name] is a 81 y.o. male here with chest pain. He is tachy and hypotensive with rapid afib. I think more likely to be septic causing hypotension and tachycardia. Code sepsis initiated, will give 30 cc/kg bolus. Will reassess.    12:15 PM HR improved initially to low 100s and BP improved to 120/60. However, several minutes ago, HR went back to 120s. Given cardizem. Troponin mildly positive, likely demand ischemia. CXR showed possible atelectasis. WBC nl, lactate mildly elevated. Given abx for possible pneumonia. Consulted cardiology, but I think likely demand ischemia and will likely not do much workup. Will admit to stepdown.    Richardean Canal, MD 06/09/15 [phone]

## 2015-06-09 NOTE — Consult Note (Signed)
CARDIOLOGY CONSULT NOTE     Primary Care Physician: [facility] Referring Physician:  Dr Janee Morn  Admit Date: 06/09/2015  Reason for consultation:  Chest pain  [name] is a 81 y.o. male with a h/o end stage COPD on home hospice who presents with N/V and hypotension.  He is normally on 5 L of oxygen at home. He presents the emergency department today with burning chest pain and difficulty breathing.  His initial blood pressure was 60/35. After receiving some fluids improved but has now decreased to 85/52. Heart rate has been between 113-139.  This was felt to be AF by ED team (though sinus with PACs) and he received IV diltiazem.  He has subsequently recovered and is currently without complaint.  Presently, he has chest discomfort only with deep breathing, also worsened with chest wall palpitation.   Past Medical History  Diagnosis Date  . Hypertension   . Arthritis   . Ex-smoker   . Rheumatoid arthritis(714.0)   . Pulmonary nodule   . DDD (degenerative disc disease)   . Complication of anesthesia     " I am difficult to wake up "  . Chronic kidney disease     acute 2013, 2014 on chronic stage 3.  . Anemia   . Proteus septicemia 02/2013  . Barrett's esophagus 04/2006    egd by dr Virginia Rochester.   . Colon polyp 04/2006    Hyperplastic on colonoscopy by Dr Virginia Rochester.   . Encephalopathy, metabolic 02/2013  . COPD (chronic obstructive pulmonary disease)    Past Surgical History  Procedure Laterality Date  . Joint replacement      bilateral knee replacement  . Hernia repair    . Back surgery      X 2  . Appendectomy      . aspirin EC  325 mg Oral Daily  . hydrocortisone sod succinate (SOLU-CORTEF) inj  100 mg Intravenous Q8H  . pantoprazole (PROTONIX) IV  40 mg Intravenous Daily   . sodium chloride 125 mL/hr (06/09/15 1506)  . [START ON 06/10/2015] cefTAZidime (FORTAZ)  IV    . sodium chloride    . [START ON 06/11/2015] vancomycin      Allergies  Allergen Reactions    . Ciprofloxacin Other (See Comments)    CRAZY TALK, WEAKNESS, SEDATIVE ACTIVITY  . Codeine Other (See Comments)    Agitation and aggressive   . Hydrocodone Other (See Comments)    Makes patient hallucinate, cannot sleep  . Oxycodone Other (See Comments)    Makes patient hallucinate, cannot sleep  . Lisinopril Other (See Comments)  . Penicillins Hives  . Sulfa Antibiotics Other (See Comments)    Per MAR    Social History   Social History  . Marital Status: Married    Spouse Name: N/A  . Number of Children: N/A  . Years of Education: N/A   Occupational History  . Not on file.   Social History Main Topics  . Smoking status: Former Smoker -- 0.50 packs/day for 30 years    Types: Cigarettes    Quit date: 10/12/1973  . Smokeless tobacco: Never Used  . Alcohol Use: No  . Drug Use: No  . Sexual Activity: No   Other Topics Concern  . Not on file   Social History Narrative    Family History  Problem Relation Age of Onset  . Coronary artery disease Brother   . Hypertension Father   . Hypertension Mother  ROS- unable to provide due to SOB  Physical Exam: Telemetry: Filed Vitals:   06/09/15 1415 06/09/15 1430 06/09/15 1445 06/09/15 1500  BP: 109/65 113/50 118/63 107/52  Pulse: 77 81 91 91  Temp:      TempSrc:      Resp: 19 17 19 16   Height:      SpO2: 97% 98% 96% 96%    GEN- The patient is chronically ill appearing, alert and oriented x 3 today.   Head- normocephalic, atraumatic Eyes-  Sclera clear, conjunctiva pink Ears- hearing intact Oropharynx- clear Neck- supple,  Lungs- expiratory wheezes with poor air movement Heart- Regular rate and rhythm with frequent ectopy GI- soft, NT, ND, + BS Extremities- no clubbing, cyanosis, or edema MS- diffuse muscle atrophy Skin- no rash or lesion Psych- euthymic mood, full affect Neuro- strength and sensation are intact  EKG: reveals sinus tachycardia with PACs.  HE DOES NOT HAVE AFIB DOCUMENTED ON TELE OR EKG  TODAY  Labs:   Lab Results  Component Value Date   WBC 8.1 06/09/2015   HGB 12.5* 06/09/2015   HCT 41.6 06/09/2015   MCV 96.5 06/09/2015   PLT 172 06/09/2015    Recent Labs Lab 06/09/15 0930  NA 143  K 3.7  CL 101  CO2 28  BUN 48*  CREATININE 2.93*  CALCIUM 10.1  PROT 5.5*  BILITOT 1.3*  ALKPHOS 90  ALT 30  AST 41  GLUCOSE 84   Lab Results  Component Value Date   CKTOTAL 53 10/13/2011   CKMB 4.0 10/13/2011   TROPONINI 0.10* 06/09/2015    Lab Results  Component Value Date   CHOL  04/27/2009    195        ATP III CLASSIFICATION:  <200     mg/dL   Desirable  04/29/2009  mg/dL   Borderline High  353-614    mg/dL   High          Lab Results  Component Value Date   HDL 65 04/27/2009   Lab Results  Component Value Date   LDLCALC * 04/27/2009    111        Total Cholesterol/HDL:CHD Risk Coronary Heart Disease Risk Table                     Men   Women  1/2 Average Risk   3.4   3.3  Average Risk       5.0   4.4  2 X Average Risk   9.6   7.1  3 X Average Risk  23.4   11.0        Use the calculated Patient Ratio above and the CHD Risk Table to determine the patient's CHD Risk.        ATP III CLASSIFICATION (LDL):  <100     mg/dL   Optimal  04/29/2009  mg/dL   Near or Above                    Optimal  130-159  mg/dL   Borderline  540-086  mg/dL   High  761-950     mg/dL   Very High   Lab Results  Component Value Date   TRIG 134 08/03/2013   TRIG 93 04/27/2009   Lab Results  Component Value Date   CHOLHDL 3.0 04/27/2009   No results found for: LDLDIRECT    ASSESSMENT AND PLAN:   1. Sinus with PACs Not afib Would treat  underlying lung disease and hypotension  2. Atypical chest pain Not a candidate for invasive CV procedures Family would prefer conservative measures Likely due to nausea/ chest wall pain Also with some reactive airway component Conservative measures would be appropriate in this hospice patient Would not cycle markers or workup  further unless unable to control pain otherwise  Cardiology to see as needed while here Please call with questions   Hillis Range, MD 06/09/2015  3:18 PM

## 2015-06-09 NOTE — ED Notes (Signed)
Pt monitored by pulse ox, bp cuff, and 12-lead. Silverio Lay MD at bedside.

## 2015-06-09 NOTE — ED Notes (Signed)
Code Sepsis called @ [phone]

## 2015-06-09 NOTE — H&P (Signed)
[facility] History and Physical                                                                                    [name], is a 81 y.o. male  MRN: [id number]   DOB - 23-May-1928  Admit Date - 06/09/2015  Outpatient Primary MD for the patient is [facility]  Referring Physician:  Dr. Silverio Lay  Chief Complaint:   Chief Complaint  Patient presents with  . 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  He has end stage COPD on Hospice, rheumatoid arthritis, chronic kidney disease, and hypertension. He has a history of CABG.  He is normally on 5 L of oxygen at home. He presents the emergency department today with burning chest pain and difficulty breathing. As the patient is having difficulty breathing his family provides the history. Reportedly the patient has not been eating well all week. Thursday he was lifted by a family member and shortly after developed left-sided pain and swelling. The family is worried he had broken a rib. On Friday they noticed that he was urinating less frequently. On Saturday he had 1 episode of vomiting and multiple episodes of diarrhea. He was recently treated for a UTI with a course of Keflex that ended Wednesday. This morning the patient developed chest pain at approximately 8 AM the pain is located centrally. It does not seem to radiate. It feels like both burning and pressure. It is constant.    His daughter, [name] reports that she had a virus on Wednesday with vomiting and diarrhea.  In the ER the patient firmly confirms that he does not want narcotic medications. He wants to be of clear mind. Oxycodone makes him hallucinate.  His initial blood pressure was 60/35. After receiving some fluids improved but has now decreased to 85/52.  Heart rate has been between 113-139.  His troponin is elevated at 0.10. O2 sats were 87%, His creatinine is normally 2.5-2.9.  EKG shows rapid A. fib.  Review of Systems   The  patient is unable to give a review of systems due to dyspnea and severe sepsis.  Past Medical History  Past Medical History  Diagnosis Date  . Hypertension   . Arthritis   . Ex-smoker   . Rheumatoid arthritis(714.0)   . Pulmonary nodule   . DDD (degenerative disc disease)   . Complication of anesthesia     " I am difficult to wake up "  . Chronic kidney disease     acute 2013, 2014 on chronic stage 3.  . Anemia   . Proteus septicemia 02/2013  . Barrett's esophagus 04/2006    egd by dr Virginia Rochester.   . Colon polyp 04/2006    Hyperplastic on colonoscopy by Dr Virginia Rochester.   . Encephalopathy, metabolic 02/2013  . COPD (chronic obstructive pulmonary disease)     Past Surgical History  Procedure Laterality Date  . Joint replacement      bilateral knee replacement  . Hernia repair    . Back surgery      X 2  .  Appendectomy      Social History Social History  Substance Use Topics  . Smoking status: Former Smoker -- 0.50 packs/day for 30 years    Types: Cigarettes    Quit date: 10/12/1973  . Smokeless tobacco: Never Used  . Alcohol Use: No   is on hospice. His cared for in his home by his family.  Family History Family History  Problem Relation Age of Onset  . Coronary artery disease Brother   . Hypertension Father   . Hypertension Mother     Prior to Admission medications   Medication Sig Start Date End Date Taking? Authorizing Provider  albuterol (PROVENTIL) (2.5 MG/3ML) 0.083% nebulizer solution Take 2.5 mg by nebulization every 6 (six) hours as needed for wheezing or shortness of breath. For shortness of breath   Yes Historical Provider, MD  clotrimazole (LOTRIMIN) 1 % cream Apply 1 application topically 2 (two) times daily.   Yes Historical Provider, MD  escitalopram (LEXAPRO) 5 MG tablet Take 20 mg by mouth daily.    Yes Historical Provider, MD  gabapentin (NEURONTIN) 100 MG capsule Take 100 mg by mouth at bedtime.   Yes Historical Provider, MD  guaifenesin (HUMIBID E) 400 MG  TABS tablet Take 400 mg by mouth every 4 (four) hours.   Yes Historical Provider, MD  haloperidol (HALDOL) 0.5 MG tablet Take 0.5 mg by mouth every 8 (eight) hours as needed for agitation.   Yes Historical Provider, MD  Loperamide HCl (IMODIUM A-D PO) Take 1 tablet by mouth as needed (loose stool).   Yes Historical Provider, MD  omeprazole (PRILOSEC) 40 MG capsule Take 40 mg by mouth daily.   Yes Historical Provider, MD  predniSONE (DELTASONE) 5 MG tablet Take 5 mg by mouth 2 (two) times daily with a meal.    Yes Historical Provider, MD  Skin Protectants, Misc. (EUCERIN) cream Apply 1 application topically daily.   Yes Historical Provider, MD  Tamsulosin HCl (FLOMAX) 0.4 MG CAPS Take 1 capsule (0.4 mg total) by mouth daily after supper. 06/13/12  Yes Vassie Loll, MD  torsemide (DEMADEX) 20 MG tablet Take 40 mg by mouth every other day.    Yes Historical Provider, MD  gentamicin (GARAMYCIN) 0.3 % ophthalmic solution Place 1 drop into both eyes every 4 (four) hours.    Historical Provider, MD    Allergies  Allergen Reactions  . Ciprofloxacin Other (See Comments)    CRAZY TALK, WEAKNESS, SEDATIVE ACTIVITY  . Codeine Other (See Comments)    Agitation and aggressive   . Hydrocodone Other (See Comments)    Makes patient hallucinate, cannot sleep  . Oxycodone Other (See Comments)    Makes patient hallucinate, cannot sleep  . Lisinopril Other (See Comments)  . Penicillins Hives  . Sulfa Antibiotics Other (See Comments)    Per [facility]    Physical Exam  Vitals  Blood pressure 107/52, pulse 91, temperature 97.4 F (36.3 C), temperature source Oral, resp. rate 16, height 5\' 5"  (1.651 m), SpO2 96 %.   General: Elderly, chronically ill-appearing male lying in bed with mildly increased work of breathing.  Psych:  He is awake, calm, appropriate. Unable to answer many questions due to dyspnea  Neuro:   Has left lower extremity weakness 3 over 5. Able to move other 3 extremities. Follows  commands.  ENT: His left eye has exudates and a red swollen conjunctiva.  Neck:  Supple, No lymphadenopathy appreciated  Respiratory:  Short of breath, unable to listen posteriorly. No frank wheezes  crackles or rhonchi  Cardiac:  Irregularly irregular, no frank  Murmurs, no LE edema noted, no JVD.    Abdomen:  Positive bowel sounds, Soft, Non tender, Non distended,  No masses appreciated  Skin:  Multiple skin lesions that look like actinic keratoses, multiple bruises on arms.   Data Review  CBC  Recent Labs Lab 06/09/15 0930  WBC 8.1  HGB 12.5*  HCT 41.6  PLT 172  MCV 96.5  MCH 29.0  MCHC 30.0  RDW 15.2  LYMPHSABS 0.3*  MONOABS 0.4  EOSABS 0.3  BASOSABS 0.0    Chemistries   Recent Labs Lab 06/09/15 0930  NA 143  K 3.7  CL 101  CO2 28  GLUCOSE 84  BUN 48*  CREATININE 2.93*  CALCIUM 10.1  AST 41  ALT 30  ALKPHOS 90  BILITOT 1.3*     Cardiac Enzymes  Recent Labs Lab 06/09/15 0930  TROPONINI 0.10*     Urinalysis    Component Value Date/Time   COLORURINE AMBER* 06/09/2015 1219   APPEARANCEUR TURBID* 06/09/2015 1219   LABSPEC 1.013 06/09/2015 1219   PHURINE 5.0 06/09/2015 1219   GLUCOSEU NEGATIVE 06/09/2015 1219   HGBUR MODERATE* 06/09/2015 1219   BILIRUBINUR NEGATIVE 06/09/2015 1219   KETONESUR NEGATIVE 06/09/2015 1219   PROTEINUR NEGATIVE 06/09/2015 1219   UROBILINOGEN 0.2 06/09/2015 1219   NITRITE POSITIVE* 06/09/2015 1219   LEUKOCYTESUR LARGE* 06/09/2015 1219    Imaging results:   Dg Chest Port 1 View  06/09/2015   CLINICAL DATA:  Chest pain for 4 days.  EXAM: PORTABLE CHEST - 1 VIEW  COMPARISON:  04/17/2015  FINDINGS: Previous median sternotomy and CABG procedure. Mild cardiac enlargement. Lung volumes are suboptimally inflated. Bibasilar atelectasis again noted.  IMPRESSION: 1. Similar appearance of low lung volumes and bibasilar atelectasis.   Electronically Signed   By: Signa Kell M.D.   On: 06/09/2015 10:04    My personal  review of EKG: Rapid A. fib   Assessment & Plan  Principal Problem:   Severe sepsis Active Problems:   Acute worsening of stage 3 chronic kidney disease   Decompensated COPD with exacerbation (chronic obstructive pulmonary disease)   Chest pain at rest   Hypotension   UTI (lower urinary tract infection)   Sinus tachycardia   Rheumatoid arthritis   Left leg weakness   Elevated troponin   Lactic acidosis    Severe sepsis and a hospice patient Hypotension, lactic acidosis, tachycardia, increased respirations. Possibly due to UTI and viral infection.  Gently giving IV fluid resuscitation.  He has received 2500 thus far. Blood and urine cultures were obtained.  Fortaz and vancomycin per pharmacy started. Patient will be admitted to stepdown.    Hypotension Likely multifactorial, due to to infection (UTI and c-diff vs viral infection)  hypovolemia, rapid afib.  Patient's pressures seem to respond to IVF then drop again.  Will start solucortef (he is on chronic prednisone) for stress dose steroids.  Discussed pressors / central line with family who agrees to "no pressors".  Pressure is currently stable at approximately 100.   Sinus Tach with PAC and elevated troponin in a patient with CAD s/p CABG. Current troponin 0.10 Likely due to acute infection and end stage COPD.   Patient received 1 dose of IV Cardizem. Due to low blood pressure this was not repeated. Pulse rate seems to have reduced with IV fluids and Cardizem. Cardiology has been consulted - appreciate their recommendations.   COPD exacerbation in the setting  of end-stage COPD. Patient is on 5 L of oxygen at baseline. Will continue supportive oxygen therapy. Family is okay with BiPAP if necessary Placed on solucortef.  Nebulizers scheduled and PRN.   Vomiting and Diarrhea x 1 day. Dtr was recently ill with the same symptoms. Will check a C. difficile, as he was recently on antibiotics for UTI. Gently hydrate. Supportive  care. Will start oral vancomycin as empiric treatment for c-diff.   Persistant Urinary tract infection UA positive. Patient failed outpatient treatment with Keflex. Culture pending. Fortaz and vancomycin on board.   Rheumatoid arthritis Patient was on chronic prednisone 5 mg twice a day. No current complaints regarding pain. Has been placed on stress dose steroids (solucortef) for hypertension.   Consultants Called:  Palliative Medicine, Cardiology  Family Communication:   Daughters and niece at bedside - DTR [name] is primary care taker.  Code Status:  DNR / DNI, No pressors  Condition:  Poor prognosis.  Could easily decompensate and not survive the hospitalization.  Potential Disposition: Home with hospice vs [facility].  Time spent in minutes : 60   Algis Downs,  PA-C on 06/09/2015 at 3:17 PM Between 7am to 7pm - Pager - [phone] After 7pm go to [email or web address] - password [email or web address] And look for the night coverage person covering me after hours  [facility]

## 2015-06-09 NOTE — ED Notes (Signed)
Lab notified re: add on troponin

## 2015-06-09 NOTE — Progress Notes (Addendum)
ANTIBIOTIC CONSULT NOTE - INITIAL  Pharmacy Consult for Ceftazidime and Vancomycin Indication: Sepsis  Allergies  Allergen Reactions  . Ciprofloxacin Other (See Comments)    CRAZY TALK, WEAKNESS, SEDATIVE ACTIVITY  . Codeine Other (See Comments)    Agitation and aggressive   . Hydrocodone Other (See Comments)    Makes patient hallucinate, cannot sleep  . Oxycodone Other (See Comments)    Makes patient hallucinate, cannot sleep  . Lisinopril Other (See Comments)  . Penicillins Hives  . Sulfa Antibiotics Other (See Comments)    Per MAR    Patient Measurements: Height: 5\' 5"  (165.1 cm) IBW/kg (Calculated) : 61.5 TBW 70 kg back in 2014  Vital Signs: Temp: 97.4 F (36.3 C) (09/11 0915) Temp Source: Oral (09/11 0915) BP: 60/35 mmHg (09/11 0919) Pulse Rate: 130 (09/11 0919) Intake/Output from previous day:   Intake/Output from this shift:    Labs: No results for input(s): WBC, HGB, PLT, LABCREA, CREATININE in the last 72 hours. CrCl cannot be calculated (Unknown ideal weight.). No results for input(s): VANCOTROUGH, VANCOPEAK, VANCORANDOM, GENTTROUGH, GENTPEAK, GENTRANDOM, TOBRATROUGH, TOBRAPEAK, TOBRARND, AMIKACINPEAK, AMIKACINTROU, AMIKACIN in the last 72 hours.   Microbiology: No results found for this or any previous visit (from the past 720 hour(s)).  Medical History: Past Medical History  Diagnosis Date  . Hypertension   . Arthritis   . Ex-smoker   . Rheumatoid arthritis(714.0)   . Pulmonary nodule   . DDD (degenerative disc disease)   . Complication of anesthesia     " I am difficult to wake up "  . Chronic kidney disease     acute 2013, 2014 on chronic stage 3.  . Anemia   . Proteus septicemia 02/2013  . Barrett's esophagus 04/2006    egd by dr 05/2006.   . Colon polyp 04/2006    Hyperplastic on colonoscopy by Dr 05/2006.   . Encephalopathy, metabolic 02/2013  . COPD (chronic obstructive pulmonary disease)     Assessment: 81 yo M presents on 9/11 with nausea  and chest pain. Code sepsis was called and pharmacy consulted to dose abx. PCN allergy but has received cephalosporins in the past. Afebrile, all other labs pending. Does have a hx of CKD. Cx's ordered. SCr came back elevated at 2.93, CrCl ~ 91ml/min.  Goal of Therapy:  Vancomycin trough level 15-20 mcg/ml  Resolution of infection  Plan:  Give ceftazidime 2g IV x 1, then start ceftazidime 2g IV Q24 Give vancomycin 1,250mg  IV x 1, then start vancomycin 1g IV Q48 Monitor clinical picture, renal function, VT prn F/U C&S, abx deescalation / LOT   Khyrin Trevathan J 06/09/2015,9:36 AM

## 2015-06-09 NOTE — ED Notes (Addendum)
39 yom presents to the ED via EMS. EMS did give 324mg  of aspirin. Patient has end stage COPD and is a hospice patient. He wears 5L O2 at home. On Thursday, 06/06/2015, this family was picking him up from his hospital bed to put him on the bedside commode. Family stated, they thought he had broken some ribs. Hospice did not plan on doing anything about this issue. Today patient experiences nausea and chest pain, rating it a 5 on a 0-10 pain scale. Patient states it is in the middle of his chest and that it is a sharp pain with pressure.

## 2015-06-10 ENCOUNTER — Inpatient Hospital Stay (HOSPITAL_COMMUNITY)

## 2015-06-10 DIAGNOSIS — I471 Supraventricular tachycardia: Secondary | ICD-10-CM

## 2015-06-10 DIAGNOSIS — N183 Chronic kidney disease, stage 3 (moderate): Secondary | ICD-10-CM

## 2015-06-10 DIAGNOSIS — J9691 Respiratory failure, unspecified with hypoxia: Secondary | ICD-10-CM | POA: Insufficient documentation

## 2015-06-10 DIAGNOSIS — R7989 Other specified abnormal findings of blood chemistry: Secondary | ICD-10-CM

## 2015-06-10 DIAGNOSIS — I9589 Other hypotension: Secondary | ICD-10-CM

## 2015-06-10 DIAGNOSIS — M069 Rheumatoid arthritis, unspecified: Secondary | ICD-10-CM

## 2015-06-10 DIAGNOSIS — N289 Disorder of kidney and ureter, unspecified: Secondary | ICD-10-CM

## 2015-06-10 DIAGNOSIS — E872 Acidosis: Secondary | ICD-10-CM

## 2015-06-10 DIAGNOSIS — R079 Chest pain, unspecified: Secondary | ICD-10-CM

## 2015-06-10 DIAGNOSIS — J9692 Respiratory failure, unspecified with hypercapnia: Secondary | ICD-10-CM

## 2015-06-10 DIAGNOSIS — N390 Urinary tract infection, site not specified: Secondary | ICD-10-CM

## 2015-06-10 LAB — BASIC METABOLIC PANEL
ANION GAP: 12 (ref 5–15)
BUN: 44 mg/dL — AB (ref 6–20)
CALCIUM: 8.1 mg/dL — AB (ref 8.9–10.3)
CO2: 21 mmol/L — AB (ref 22–32)
Chloride: 110 mmol/L (ref 101–111)
Creatinine, Ser: 2.66 mg/dL — ABNORMAL HIGH (ref 0.61–1.24)
GFR calc Af Amer: 24 mL/min — ABNORMAL LOW (ref >60)
GFR calc non Af Amer: 21 mL/min — ABNORMAL LOW (ref >60)
GLUCOSE: 97 mg/dL (ref 65–99)
POTASSIUM: 4 mmol/L (ref 3.5–5.1)
Sodium: 143 mmol/L (ref 135–145)

## 2015-06-10 LAB — CBC
HEMATOCRIT: 35.3 % — AB (ref 39.0–52.0)
Hemoglobin: 10.3 g/dL — ABNORMAL LOW (ref 13.0–17.0)
MCH: 28.8 pg (ref 26.0–34.0)
MCHC: 29.2 g/dL — ABNORMAL LOW (ref 30.0–36.0)
MCV: 98.6 fL (ref 78.0–100.0)
Platelets: 133 10*3/uL — ABNORMAL LOW (ref 150–400)
RBC: 3.58 MIL/uL — ABNORMAL LOW (ref 4.22–5.81)
RDW: 15.4 % (ref 11.5–15.5)
WBC: 6.1 10*3/uL (ref 4.0–10.5)

## 2015-06-10 LAB — TROPONIN I: Troponin I: 0.13 ng/mL — ABNORMAL HIGH (ref <0.031)

## 2015-06-10 MED ORDER — ASPIRIN 81 MG PO CHEW
81.0000 mg | CHEWABLE_TABLET | Freq: Every day | ORAL | Status: DC
  Administered 2015-06-10 – 2015-06-11 (×2): 81 mg via ORAL
  Filled 2015-06-10 (×3): qty 1

## 2015-06-10 MED ORDER — MAGNESIUM SULFATE 2 GM/50ML IV SOLN
2.0000 g | Freq: Once | INTRAVENOUS | Status: AC
  Administered 2015-06-10: 2 g via INTRAVENOUS
  Filled 2015-06-10: qty 50

## 2015-06-10 NOTE — Care Management Note (Signed)
Case Management Note  Patient Details  Name: [name] MRN: [id number] Date of Birth: 07/24/1928  Subjective/Objective:     Adm w sepsis               Action/Plan:lives w fam, chart [facility] act w [facility]   Expected Discharge Date:                  Expected Discharge Plan:  Home w Hospice Care  In-House Referral:     Discharge planning Services  CM Consult  Post Acute Care Choice:    Choice offered to:     DME Arranged:    DME Agency:     HH Arranged:  RN HH Agency:  [facility]  Status of Service:     Medicare Important Message Given:    Date Medicare IM Given:    Medicare IM give by:    Date Additional Medicare IM Given:    Additional Medicare Important Message give by:     If discussed at Long Length of Stay Meetings, dates discussed:    Additional Comments: ur review done  Hanley Hays, RN 06/10/2015, 8:24 AM

## 2015-06-10 NOTE — Clinical Documentation Improvement (Signed)
Hospitalist      Please provide clinical indicators, historical, or baseline comparative data to support your documented diagnosis of Sepsis. Such as: lab values, treatment plan, etc.  If the diagnosis is no longer applicable, amend your documentation as appropriate.  Please exercise your independent, professional judgment when responding. A specific answer is not anticipated or expected.  Thank You, Nevin Bloodgood, RN, BSN, CCDS,Clinical Documentation Specialist:  [phone]  [phone]=[facility]- Health Information Management

## 2015-06-10 NOTE — Progress Notes (Signed)
  Echocardiogram 2D Echocardiogram has been performed.  Leta Jungling M 06/10/2015, 3:12 PM

## 2015-06-10 NOTE — Progress Notes (Signed)
PATIENT ID: [id number] y.o. 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  INTERVAL HISTORY: Continues treatment for UTI and steroids for COPD.  SUBJECTIVE:  [name] denies any chest pain.  His breathing has improved.   PHYSICAL EXAM Filed Vitals:   06/10/15 0500 06/10/15 0600 06/10/15 0715 06/10/15 0817  BP: 104/63 95/51    Pulse: 76 72    Temp:    97.3 F (36.3 C)  TempSrc:    Oral  Resp: 11 9    Height:      Weight: 76.4 kg (168 lb 6.9 oz)     SpO2: 99% 100% 96%    General:  Chronically ill-appearing elderly man sitting upright in NAD Neck: No JVD Lungs:  Mild expiratory wheezing. Poor air movement. Heart:  Regular rhythm with frequent ectopy.  No m/r/g.  Nl S1/S2 Abdomen:  Soft, NT, ND.  +BS Extremities:  WWP.  No edema  LABS: Lab Results  Component Value Date   TROPONINI 0.13* 06/10/2015   Results for orders placed or performed during the hospital encounter of 06/09/15 (from the past 24 hour(s))  Comprehensive metabolic panel     Status: Abnormal   Collection Time: 06/09/15  9:30 AM  Result Value Ref Range   Sodium 143 135 - 145 mmol/L   Potassium 3.7 3.5 - 5.1 mmol/L   Chloride 101 101 - 111 mmol/L   CO2 28 22 - 32 mmol/L   Glucose, Bld 84 65 - 99 mg/dL   BUN 48 (H) 6 - 20 mg/dL   Creatinine, Ser 3.61 (H) 0.61 - 1.24 mg/dL   Calcium 44.3 8.9 - 15.4 mg/dL   Total Protein 5.5 (L) 6.5 - 8.1 g/dL   Albumin 2.9 (L) 3.5 - 5.0 g/dL   AST 41 15 - 41 U/L   ALT 30 17 - 63 U/L   Alkaline Phosphatase 90 38 - 126 U/L   Total Bilirubin 1.3 (H) 0.3 - 1.2 mg/dL   GFR calc non Af Amer 18 (L) >60 mL/min   GFR calc Af Amer 21 (L) >60 mL/min   Anion gap 14 5 - 15  CBC WITH DIFFERENTIAL     Status: Abnormal   Collection Time: 06/09/15  9:30 AM  Result Value Ref Range   WBC 8.1 4.0 - 10.5 K/uL   RBC 4.31 4.22 - 5.81 MIL/uL   Hemoglobin 12.5 (L) 13.0 - 17.0 g/dL   HCT 00.8 67.6 - 19.5 %   MCV 96.5 78.0 - 100.0 fL   MCH 29.0 26.0 - 34.0 pg   MCHC 30.0 30.0 - 36.0 g/dL   RDW 09.3 26.7 - 12.4 %   Platelets 172 150 - 400 K/uL   Neutrophils Relative % 88 (H) 43 - 77 %   Neutro Abs 7.1 1.7 - 7.7 K/uL   Lymphocytes Relative 3 (L) 12 - 46 %   Lymphs Abs 0.3 (L) 0.7 - 4.0 K/uL   Monocytes Relative 5 3 - 12 %   Monocytes Absolute 0.4 0.1 - 1.0 K/uL   Eosinophils Relative 4 0 - 5 %   Eosinophils Absolute 0.3 0.0 - 0.7 K/uL   Basophils Relative 0 0 - 1 %   Basophils Absolute 0.0 0.0 - 0.1 K/uL  Troponin I     Status: Abnormal   Collection Time: 06/09/15  9:30 AM  Result Value Ref Range   Troponin I 0.10 (H) <0.031 ng/mL  I-Stat CG4 Lactic Acid, ED  (  not at  [facility])     Status: Abnormal   Collection Time: 06/09/15 10:36 AM  Result Value Ref Range   Lactic Acid, Venous 2.71 (HH) 0.5 - 2.0 mmol/L   Comment NOTIFIED PHYSICIAN   I-Stat CG4 Lactic Acid, ED     Status: None   Collection Time: 06/09/15 10:49 AM  Result Value Ref Range   Lactic Acid, Venous 1.62 0.5 - 2.0 mmol/L  Urinalysis, Routine w reflex microscopic (not at [facility])     Status: Abnormal   Collection Time: 06/09/15 12:19 PM  Result Value Ref Range   Color, Urine AMBER (A) YELLOW   APPearance TURBID (A) CLEAR   Specific Gravity, Urine 1.013 1.005 - 1.030   pH 5.0 5.0 - 8.0   Glucose, UA NEGATIVE NEGATIVE mg/dL   Hgb urine dipstick MODERATE (A) NEGATIVE   Bilirubin Urine NEGATIVE NEGATIVE   Ketones, ur NEGATIVE NEGATIVE mg/dL   Protein, ur NEGATIVE NEGATIVE mg/dL   Urobilinogen, UA 0.2 0.0 - 1.0 mg/dL   Nitrite POSITIVE (A) NEGATIVE   Leukocytes, UA LARGE (A) NEGATIVE  Urine microscopic-add on     Status: Abnormal   Collection Time: 06/09/15 12:19 PM  Result Value Ref Range   Squamous Epithelial / LPF RARE RARE   WBC, UA TOO NUMEROUS TO COUNT <3 WBC/hpf   Bacteria, UA MANY (A) RARE  Troponin I (q 6hr x 3)     Status: Abnormal   Collection Time: 06/09/15  1:30 PM  Result Value Ref Range   Troponin I 0.16 (H) <0.031 ng/mL  I-Stat CG4 Lactic Acid, ED  (not at   [facility])     Status: None   Collection Time: 06/09/15  2:20 PM  Result Value Ref Range   Lactic Acid, Venous 1.10 0.5 - 2.0 mmol/L  MRSA PCR Screening     Status: None   Collection Time: 06/09/15  4:37 PM  Result Value Ref Range   MRSA by PCR NEGATIVE NEGATIVE  Troponin I (q 6hr x 3)     Status: Abnormal   Collection Time: 06/09/15  6:22 PM  Result Value Ref Range   Troponin I 0.16 (H) <0.031 ng/mL  Magnesium     Status: Abnormal   Collection Time: 06/09/15  6:22 PM  Result Value Ref Range   Magnesium 1.4 (L) 1.7 - 2.4 mg/dL  Lactic acid, plasma     Status: None   Collection Time: 06/09/15  6:22 PM  Result Value Ref Range   Lactic Acid, Venous 0.7 0.5 - 2.0 mmol/L  Procalcitonin     Status: None   Collection Time: 06/09/15  6:22 PM  Result Value Ref Range   Procalcitonin 2.72 ng/mL  Protime-INR     Status: None   Collection Time: 06/09/15  6:22 PM  Result Value Ref Range   Prothrombin Time 14.9 11.6 - 15.2 seconds   INR 1.15 0.00 - 1.49  APTT     Status: None   Collection Time: 06/09/15  6:22 PM  Result Value Ref Range   aPTT 27 24 - 37 seconds  Lactic acid, plasma     Status: None   Collection Time: 06/09/15  9:31 PM  Result Value Ref Range   Lactic Acid, Venous 1.0 0.5 - 2.0 mmol/L  Troponin I (q 6hr x 3)     Status: Abnormal   Collection Time: 06/10/15 12:55 AM  Result Value Ref Range   Troponin I 0.13 (H) <0.031 ng/mL  CBC     Status:  Abnormal   Collection Time: 06/10/15 12:55 AM  Result Value Ref Range   WBC 6.1 4.0 - 10.5 K/uL   RBC 3.58 (L) 4.22 - 5.81 MIL/uL   Hemoglobin 10.3 (L) 13.0 - 17.0 g/dL   HCT 78.2 (L) 95.6 - 21.3 %   MCV 98.6 78.0 - 100.0 fL   MCH 28.8 26.0 - 34.0 pg   MCHC 29.2 (L) 30.0 - 36.0 g/dL   RDW 08.6 57.8 - 46.9 %   Platelets 133 (L) 150 - 400 K/uL  Basic metabolic panel     Status: Abnormal   Collection Time: 06/10/15 12:55 AM  Result Value Ref Range   Sodium 143 135 - 145 mmol/L   Potassium 4.0 3.5 - 5.1 mmol/L   Chloride 110 101 -  111 mmol/L   CO2 21 (L) 22 - 32 mmol/L   Glucose, Bld 97 65 - 99 mg/dL   BUN 44 (H) 6 - 20 mg/dL   Creatinine, Ser 6.29 (H) 0.61 - 1.24 mg/dL   Calcium 8.1 (L) 8.9 - 10.3 mg/dL   GFR calc non Af Amer 21 (L) >60 mL/min   GFR calc Af Amer 24 (L) >60 mL/min   Anion gap 12 5 - 15    Intake/Output Summary (Last 24 hours) at 06/10/15 0915 Last data filed at 06/10/15 0914  Gross per 24 hour  Intake   1695 ml  Output    105 ml  Net   1590 ml    EKG:  06/09/15: Sinus tachycardia with PACs and one PVC.  LAFB. LVH with repolarization abnormalities.  Telemetry: sinus tach with PACs and occasional PVCs.  Heart rate mostly under 100 with occasional episodes in 120s  ASSESSMENT AND PLAN:  Principal Problem:   Severe sepsis Active Problems:   Rheumatoid arthritis   Sinus tachycardia   Acute worsening of stage 3 chronic kidney disease   Decompensated COPD with exacerbation (chronic obstructive pulmonary disease)   Chest pain at rest   Left leg weakness   Hypotension   Elevated troponin   Lactic acidosis   UTI (lower urinary tract infection)   Sepsis   Respiratory failure with hypoxia and hypercapnia    # Sinus tachycardia with PACs: Likely due to underlying lung disease and sepsis 2/2 urinary source.  No atrial fibrillation documented. - treatment of underlying disease per primary team - Agree with gentle IVF  # NSTEMI: Likely due to demand ischemia.  Patient denies CP and hemodynamics are improving with treatment of underlying disease.  Per prior discussions and the fact that he is on hospice care, will be managed conservatively.  Not a candidate for invasive procedures. - Switch aspirin to 81mg  daily - No statin given goals of care - echo pending  Cardiology will see as needed. Please call with questions  06/10/2015 9:15 AM

## 2015-06-10 NOTE — Progress Notes (Signed)
[facility] PROGRESS NOTE  CARVER MURAKAMI UUV:[id number] DOB: Feb 18, 1928 DOA: 06/09/2015 PCP: [name] [facility]  Assessment/Plan: 1-severe sepsis due to UTI -will continue current abx's while waiting culture results  -patient with good BP and afebrile now -WBC's WNL -will continue supportive care  2-elevated troponin: due to demand ischemia: no CP -patient also denies SOB -not a candidate for invasive therapy -continue medical management   3-GERD: will continue pepcid  4-diarrhea: at home; most likely viral gastroenteritis  -no further BM's here -will check C. Diff if loose stools continue  5-end stage COPD with chronic resp failure: will continue home inhaler therapy -no wheezing -no complaints of worsening SOB -patient chronically on steroids -receiving solucortef now -will monitor and continue O2 supplementation  6-BPH: will continue flomax  7-acute on chronic renal failure: stage 3 at baseline -due to hypotension and UTI -improving with IVF's -continue holding diuretics  8-depression: -continue celexa   Code Status: DNR/DNI Family Communication: no family at bedside Disposition Plan: follow Palliative care consult for GOC and base on response determine best discharge pathway    Consultants:  Cardiology   Procedures:  See below for x-ray reports   Antibiotics:  [name] 9/11  Vancomycin  9/11  HPI/Subjective: Patient AAOX2, no fever, denies CP or worsening on baseline SOB. Patient kidney function and BP improved.  Objective: Filed Vitals:   06/10/15 0817  BP:   Pulse:   Temp: 97.3 F (36.3 C)  Resp:     Intake/Output Summary (Last 24 hours) at 06/10/15 0830 Last data filed at 06/10/15 0300  Gross per 24 hour  Intake   1575 ml  Output    105 ml  Net   1470 ml   Filed Weights   06/09/15 1600 06/10/15 0500  Weight: 73.5 kg (162 lb 0.6 oz) 76.4 kg (168 lb 6.9 oz)    Exam:   General:  Denies CP and endorses that his  breathing is not worse than baseline. No abd pain, no further episodes of N/V and no diarrhea. Patient is afebrile  Cardiovascular: S1 and S2, no rubs, no gallops  Respiratory: scattered wheezing and rhonchi, no use of accessory muscles  Abdomen: soft, NT, ND, positive BS  Musculoskeletal: no edema, no cyanosis or clubbing   Data Reviewed: Basic Metabolic Panel:  Recent Labs Lab 06/09/15 0930 06/09/15 1822 06/10/15 0055  NA 143  --  143  K 3.7  --  4.0  CL 101  --  110  CO2 28  --  21*  GLUCOSE 84  --  97  BUN 48*  --  44*  CREATININE 2.93*  --  2.66*  CALCIUM 10.1  --  8.1*  MG  --  1.4*  --    Liver Function Tests:  Recent Labs Lab 06/09/15 0930  AST 41  ALT 30  ALKPHOS 90  BILITOT 1.3*  PROT 5.5*  ALBUMIN 2.9*   CBC:  Recent Labs Lab 06/09/15 0930 06/10/15 0055  WBC 8.1 6.1  NEUTROABS 7.1  --   HGB 12.5* 10.3*  HCT 41.6 35.3*  MCV 96.5 98.6  PLT 172 133*   Cardiac Enzymes:  Recent Labs Lab 06/09/15 0930 06/09/15 1330 06/09/15 1822 06/10/15 0055  TROPONINI 0.10* 0.16* 0.16* 0.13*   CBG: No results for input(s): GLUCAP in the last 168 hours.  Recent Results (from the past 240 hour(s))  MRSA PCR Screening     Status: None   Collection Time: 06/09/15  4:37 PM  Result Value  Ref Range Status   MRSA by PCR NEGATIVE NEGATIVE Final    Comment:        The GeneXpert MRSA Assay (FDA approved for NASAL specimens only), is one component of a comprehensive MRSA colonization surveillance program. It is not intended to diagnose MRSA infection nor to guide or monitor treatment for MRSA infections.      Studies: Dg Chest Port 1 View  06/09/2015   CLINICAL DATA:  Chest pain for 4 days.  EXAM: PORTABLE CHEST - 1 VIEW  COMPARISON:  04/17/2015  FINDINGS: Previous median sternotomy and CABG procedure. Mild cardiac enlargement. Lung volumes are suboptimally inflated. Bibasilar atelectasis again noted.  IMPRESSION: 1. Similar appearance of low lung  volumes and bibasilar atelectasis.   Electronically Signed   By: Signa Kell M.D.   On: 06/09/2015 10:04    Scheduled Meds: . antiseptic oral rinse  7 mL Mouth Rinse BID  . arformoterol  15 mcg Nebulization BID  . aspirin EC  325 mg Oral Daily  . budesonide (PULMICORT) nebulizer solution  0.5 mg Nebulization BID  . cefTAZidime (FORTAZ)  IV  2 g Intravenous Q24H  . escitalopram  20 mg Oral Daily  . famotidine (PEPCID) IV  20 mg Intravenous Q24H  . gabapentin  100 mg Oral QHS  . guaifenesin  400 mg Oral 6 times per day  . heparin  5,000 Units Subcutaneous 3 times per day  . hydrocerin   Topical Daily  . hydrocortisone sod succinate (SOLU-CORTEF) inj  100 mg Intravenous Q8H  . ipratropium  0.5 mg Nebulization Q6H  . levalbuterol  0.63 mg Nebulization Q6H  . sodium chloride  3 mL Intravenous Q12H  . tamsulosin  0.4 mg Oral QPC supper  . tobramycin  1 drop Both Eyes 6 times per day  . vancomycin  250 mg Oral 4 times per day  . [START ON 06/11/2015] vancomycin  1,000 mg Intravenous Q48H   Continuous Infusions: . sodium chloride 75 mL/hr at 06/10/15 0622    Time spent: 30 minutes   Vassie Loll  [facility] Pager [phone]. If 7PM-7AM, please contact night-coverage at [email or web address], password [facility] 06/10/2015, 8:30 AM  LOS: 1 day

## 2015-06-10 NOTE — Consult Note (Signed)
[facility] patient was admitted to hospice services in his home on 10/10/14. Hospice Dx: COPD Code Status prior to admission: DNR Attending: Hospice Provider, Dorian Pod, NP/Dr. Vanna Scotland At time of visit, no family was present. Daughter, Randolm Idol, visits after her workday, that ends at 5:00pm. Spoke with [name] via phone and she stated that it was the patient's wish to come to the hospital, d/t chest pain and SHOB, but that he continues to opt for DNR status. Patient visited at bedside and noted to be sleeping comfortably, and not awakened. Hospice Face Sheet, Plan of Care, and Medication Listing placed on shadow chart. Will continue to follow.  Nelson Chimes RN, [facility] [phone]

## 2015-06-11 ENCOUNTER — Encounter (HOSPITAL_COMMUNITY): Payer: Self-pay | Admitting: *Deleted

## 2015-06-11 DIAGNOSIS — J9600 Acute respiratory failure, unspecified whether with hypoxia or hypercapnia: Secondary | ICD-10-CM

## 2015-06-11 DIAGNOSIS — A419 Sepsis, unspecified organism: Principal | ICD-10-CM

## 2015-06-11 DIAGNOSIS — R6520 Severe sepsis without septic shock: Secondary | ICD-10-CM

## 2015-06-11 LAB — BASIC METABOLIC PANEL
Anion gap: 11 (ref 5–15)
BUN: 41 mg/dL — AB (ref 6–20)
CHLORIDE: 107 mmol/L (ref 101–111)
CO2: 21 mmol/L — ABNORMAL LOW (ref 22–32)
Calcium: 8.4 mg/dL — ABNORMAL LOW (ref 8.9–10.3)
Creatinine, Ser: 2.78 mg/dL — ABNORMAL HIGH (ref 0.61–1.24)
GFR calc Af Amer: 23 mL/min — ABNORMAL LOW (ref >60)
GFR, EST NON AFRICAN AMERICAN: 19 mL/min — AB (ref >60)
GLUCOSE: 122 mg/dL — AB (ref 65–99)
POTASSIUM: 4 mmol/L (ref 3.5–5.1)
Sodium: 139 mmol/L (ref 135–145)

## 2015-06-11 LAB — URINE CULTURE

## 2015-06-11 MED ORDER — HYDROMORPHONE HCL 1 MG/ML IJ SOLN
0.5000 mg | INTRAMUSCULAR | Status: DC | PRN
  Administered 2015-06-12 – 2015-06-13 (×3): 0.5 mg via INTRAVENOUS
  Filled 2015-06-11 (×3): qty 1

## 2015-06-11 MED ORDER — FAMOTIDINE 20 MG PO TABS
20.0000 mg | ORAL_TABLET | Freq: Every day | ORAL | Status: DC
  Administered 2015-06-11: 20 mg via ORAL
  Filled 2015-06-11: qty 1

## 2015-06-11 MED ORDER — LEVALBUTEROL HCL 0.63 MG/3ML IN NEBU
0.6300 mg | INHALATION_SOLUTION | Freq: Two times a day (BID) | RESPIRATORY_TRACT | Status: DC
Start: 2015-06-11 — End: 2015-06-12
  Administered 2015-06-11 – 2015-06-12 (×3): 0.63 mg via RESPIRATORY_TRACT
  Filled 2015-06-11 (×3): qty 3

## 2015-06-11 MED ORDER — IPRATROPIUM BROMIDE 0.02 % IN SOLN
0.5000 mg | Freq: Two times a day (BID) | RESPIRATORY_TRACT | Status: DC
  Administered 2015-06-11 – 2015-06-12 (×3): 0.5 mg via RESPIRATORY_TRACT
  Filled 2015-06-11 (×3): qty 2.5

## 2015-06-11 NOTE — Progress Notes (Signed)
PATIENT ID: 81 y.o. 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  INTERVAL HISTORY: Continues treatment for UTI and steroids for COPD.  SUBJECTIVE:  Sleepy.  [name] denies any chest pain but complains of all-over body pain.     PHYSICAL EXAM Filed Vitals:   06/11/15 0400 06/11/15 0402 06/11/15 0729 06/11/15 0905  BP: 100/54   144/63  Pulse: 77   88  Temp: 97 F (36.1 C)   97.5 F (36.4 C)  TempSrc: Axillary   Axillary  Resp: 10   21  Height:      Weight:  79.9 kg (176 lb 2.4 oz)    SpO2: 97%  100% 100%   General:  Chronically ill-appearing elderly man laying in bed in NAD Neck: No JVD Lungs: Poor air movement.  No crackles, wheezing or rhonchi on anterior exam. Heart:  Regular rhythm with frequent ectopy.  No m/r/g.  Nl S1/S2 Abdomen:  Soft, NT, ND.  +BS Extremities:  WWP.  No edema  LABS: Lab Results  Component Value Date   TROPONINI 0.13* 06/10/2015   Results for orders placed or performed during the hospital encounter of 06/09/15 (from the past 24 hour(s))  Basic metabolic panel     Status: Abnormal   Collection Time: 06/11/15  3:23 AM  Result Value Ref Range   Sodium 139 135 - 145 mmol/L   Potassium 4.0 3.5 - 5.1 mmol/L   Chloride 107 101 - 111 mmol/L   CO2 21 (L) 22 - 32 mmol/L   Glucose, Bld 122 (H) 65 - 99 mg/dL   BUN 41 (H) 6 - 20 mg/dL   Creatinine, Ser 2.40 (H) 0.61 - 1.24 mg/dL   Calcium 8.4 (L) 8.9 - 10.3 mg/dL   GFR calc non Af Amer 19 (L) >60 mL/min   GFR calc Af Amer 23 (L) >60 mL/min   Anion gap 11 5 - 15    Intake/Output Summary (Last 24 hours) at 06/11/15 0945 Last data filed at 06/11/15 0913  Gross per 24 hour  Intake 2075.5 ml  Output    200 ml  Net 1875.5 ml    EKG:  06/09/15: Sinus tachycardia with PACs and one PVC.  LAFB. LVH with repolarization abnormalities.  Telemetry: sinus rhythm and  tach with PACs and occasional PVCs.  Heart rate trending down to 70s-80s  TTE 06/10/15: Study  Conclusions  - Left ventricle: The cavity size was normal. Systolic function was normal. The estimated ejection fraction was in the range of 55% to 60%. Although no diagnostic regional wall motion abnormality was identified, this possibility cannot be completely excluded on the basis of this study. Doppler parameters are consistent with abnormal left ventricular relaxation (grade 1 diastolic dysfunction). - Aortic valve: Valve area (VTI): 2.63 cm^2. Valve area (Vmax): 2.22 cm^2. Valve area (Vmean): 2.38 cm^2. - Mitral valve: There was mild regurgitation. - Right atrium: The atrium was mildly dilated. - Pulmonary arteries: Systolic pressure was moderately increased. PA peak pressure: 53 mm Hg (S). ASSESSMENT AND PLAN:  Principal Problem:   Severe sepsis Active Problems:   Rheumatoid arthritis   Sinus tachycardia   Acute worsening of stage 3 chronic kidney disease   Decompensated COPD with exacerbation (chronic obstructive pulmonary disease)   Chest pain at rest   Left leg weakness   Hypotension   Elevated troponin   Lactic acidosis   UTI (lower urinary tract infection)   Sepsis   Respiratory failure  with hypoxia and hypercapnia    # Sinus tachycardia with PACs: Resolving.  Likely due to underlying lung disease and sepsis 2/2 urinary source.  No atrial fibrillation documented.  He continues to have regular PACs. - treatment of underlying disease per primary team - Would hold IVF as he is starting to get slightly volume overloaded  # NSTEMI: Likely due to demand ischemia.  Patient denies CP and hemodynamics are improving with treatment of underlying disease.  Per prior discussions and the fact that he is on hospice care, will be managed conservatively.  Not a candidate for invasive procedures. No acute pathology noted on echo, though full wall motion assessment was not possible.  Elevated pulmonary pressures likely due to underlying lung disease. - Switch aspirin  to 81mg  daily - No statin given goals of care  # Acute on chronic renal failure: Likely due to sepsis and hypoperfusion.  Renal function fairly stable today.  Suspect it will start to improve in the next 24-48 hours.  Would hold on additional IVF, as he is starting to get volume overloaded.    Cardiology will see as needed. Please call with questions  06/11/2015 9:45 AM

## 2015-06-11 NOTE — Progress Notes (Signed)
[facility] PROGRESS NOTE  [name] WRU:[id number] DOB: 1928-03-22 DOA: 06/09/2015 PCP: [name]  Assessment/Plan: 1-severe sepsis due to UTI: patient presented hypotensive with SBP in the 80's; elevated lactic acid, tachycardic (HR up to 120's), RR up to 27-30 and worsening renal function. UA suggesting UTI.  -will continue current abx's while waiting culture results  -patient BP has stabilized and is afebrile now -WBC's has remained WNL -will continue supportive care -patient is DNR/DNI/no pressors; main intervention and goals are for comfort  2-elevated troponin: due to demand ischemia: no CP -patient also denies SOB -not a candidate for invasive therapy -continue medical management  -appreciate cardiology inputs -no WMA seen on echo; but technically inadequate   3-GERD: will continue pepcid, while r/o C. diff  4-diarrhea: at home; most likely viral gastroenteritis  -no further BM's here -will check C. Diff if loose stools continue -denies abd pain  5-end stage COPD with chronic resp failure: will continue home inhaler therapy -no wheezing currently -reports breathing is essentially at his baseline  -patient chronically on steroids -receiving solucortef now; which will continue given sepsis -will monitor and continue O2 supplementation -continue home neb/inhaler treatment   6-BPH: will continue flomax  7-acute on chronic renal failure: stage 3 at baseline -due to hypotension and UTI -will continue holding diuretics and nephrotoxic agents -Cr 2.7 on 06/11/15  8-depression: -continue celexa   Code Status: DNR/DNI Family Communication: no family at bedside Disposition Plan: follow Palliative care consult for GOC and base on response determine best discharge pathway.   Consultants:  Cardiology   Procedures:  See below for x-ray reports   Antibiotics:  [name] 9/11  Vancomycin  9/11  HPI/Subjective: Patient AAOX2, no fever,  denies CP or worsening on baseline SOB. Denies dysuria.  Objective: Filed Vitals:   06/11/15 0400  BP: 100/54  Pulse: 77  Temp: 97 F (36.1 C)  Resp: 10    Intake/Output Summary (Last 24 hours) at 06/11/15 0840 Last data filed at 06/11/15 0600  Gross per 24 hour  Intake 1955.5 ml  Output    200 ml  Net 1755.5 ml   Filed Weights   06/09/15 1600 06/10/15 0500 06/11/15 0402  Weight: 73.5 kg (162 lb 0.6 oz) 76.4 kg (168 lb 6.9 oz) 79.9 kg (176 lb 2.4 oz)    Exam:   General:  Denies CP and endorses that his breathing is at baseline. No abd pain, no further episodes of N/V and no diarrhea. Patient is afebrile and responding to current therapy.  Cardiovascular: S1 and S2, no rubs, no gallops  Respiratory: scattered wheezing and rhonchi, no use of accessory muscles  Abdomen: soft, NT, ND, positive BS  Musculoskeletal: no edema, no cyanosis or clubbing   Data Reviewed: Basic Metabolic Panel:  Recent Labs Lab 06/09/15 0930 06/09/15 1822 06/10/15 0055 06/11/15 0323  NA 143  --  143 139  K 3.7  --  4.0 4.0  CL 101  --  110 107  CO2 28  --  21* 21*  GLUCOSE 84  --  97 122*  BUN 48*  --  44* 41*  CREATININE 2.93*  --  2.66* 2.78*  CALCIUM 10.1  --  8.1* 8.4*  MG  --  1.4*  --   --    Liver Function Tests:  Recent Labs Lab 06/09/15 0930  AST 41  ALT 30  ALKPHOS 90  BILITOT 1.3*  PROT 5.5*  ALBUMIN 2.9*   CBC:  Recent Labs  Lab 06/09/15 0930 06/10/15 0055  WBC 8.1 6.1  NEUTROABS 7.1  --   HGB 12.5* 10.3*  HCT 41.6 35.3*  MCV 96.5 98.6  PLT 172 133*   Cardiac Enzymes:  Recent Labs Lab 06/09/15 0930 06/09/15 1330 06/09/15 1822 06/10/15 0055  TROPONINI 0.10* 0.16* 0.16* 0.13*   CBG: No results for input(s): GLUCAP in the last 168 hours.  Recent Results (from the past 240 hour(s))  Blood Culture (routine x 2)     Status: None (Preliminary result)   Collection Time: 06/09/15 10:10 AM  Result Value Ref Range Status   Specimen Description  RIGHT ANTECUBITAL  Final   Special Requests BOTTLES DRAWN AEROBIC AND ANAEROBIC 5CC  Final   Culture NO GROWTH 1 DAY  Final   Report Status PENDING  Incomplete  Blood Culture (routine x 2)     Status: None (Preliminary result)   Collection Time: 06/09/15 10:18 AM  Result Value Ref Range Status   Specimen Description BLOOD LEFT HAND  Final   Special Requests BOTTLES DRAWN AEROBIC AND ANAEROBIC 4CC  Final   Culture NO GROWTH 1 DAY  Final   Report Status PENDING  Incomplete  Urine culture     Status: None (Preliminary result)   Collection Time: 06/09/15 12:19 PM  Result Value Ref Range Status   Specimen Description URINE, CLEAN CATCH  Final   Special Requests NONE  Final   Culture CULTURE REINCUBATED FOR BETTER GROWTH  Final   Report Status PENDING  Incomplete  MRSA PCR Screening     Status: None   Collection Time: 06/09/15  4:37 PM  Result Value Ref Range Status   MRSA by PCR NEGATIVE NEGATIVE Final    Comment:        The GeneXpert MRSA Assay (FDA approved for NASAL specimens only), is one component of a comprehensive MRSA colonization surveillance program. It is not intended to diagnose MRSA infection nor to guide or monitor treatment for MRSA infections.      Studies: Dg Chest Port 1 View  06/09/2015   CLINICAL DATA:  Chest pain for 4 days.  EXAM: PORTABLE CHEST - 1 VIEW  COMPARISON:  04/17/2015  FINDINGS: Previous median sternotomy and CABG procedure. Mild cardiac enlargement. Lung volumes are suboptimally inflated. Bibasilar atelectasis again noted.  IMPRESSION: 1. Similar appearance of low lung volumes and bibasilar atelectasis.   Electronically Signed   By: Signa Kell M.D.   On: 06/09/2015 10:04    Scheduled Meds: . antiseptic oral rinse  7 mL Mouth Rinse BID  . arformoterol  15 mcg Nebulization BID  . aspirin  81 mg Oral Daily  . budesonide (PULMICORT) nebulizer solution  0.5 mg Nebulization BID  . cefTAZidime (FORTAZ)  IV  2 g Intravenous Q24H  . escitalopram   20 mg Oral Daily  . famotidine (PEPCID) IV  20 mg Intravenous Q24H  . gabapentin  100 mg Oral QHS  . guaifenesin  400 mg Oral 6 times per day  . heparin  5,000 Units Subcutaneous 3 times per day  . hydrocerin   Topical Daily  . hydrocortisone sod succinate (SOLU-CORTEF) inj  100 mg Intravenous Q8H  . ipratropium  0.5 mg Nebulization Q6H  . levalbuterol  0.63 mg Nebulization Q6H  . sodium chloride  3 mL Intravenous Q12H  . tamsulosin  0.4 mg Oral QPC supper  . tobramycin  1 drop Both Eyes 6 times per day  . vancomycin  250 mg Oral 4 times per day  .  vancomycin  1,000 mg Intravenous Q48H   Continuous Infusions: . sodium chloride 75 mL/hr at 06/11/15 0654    Time spent: 30 minutes   Vassie Loll  [facility] Pager [phone]. If 7PM-7AM, please contact night-coverage at [email or web address], password [facility] 06/11/2015, 8:40 AM  LOS: 2 days

## 2015-06-11 NOTE — Progress Notes (Signed)
[name] [id number] Admission Data: 06/11/2015 4:30 PM Attending Provider: Vassie Loll, MD  [id number] Consults/ Treatment Team: Treatment Team:  Palliative [name] is a 81 y.o. male patient admitted from [facility] awake, alert  & orientated  X 3,  DNR, VSS - Blood pressure 145/89, pulse 40, temperature 98 F (36.7 C), temperature source Oral, resp. rate 20, height 5\' 5"  (1.651 m), weight 79.9 kg (176 lb 2.4 oz), SpO2 98 %., O2    5 L nasal cannular, no c/o shortness of breath, no c/o chest pain, no distress noted. No Telemetry ordered.    IV site WDL:  upper arm right, condition patent and no redness with a transparent dsg that's clean dry and intact.  Allergies:   Allergies  Allergen Reactions  . Ciprofloxacin Other (See Comments)    CRAZY TALK, WEAKNESS, SEDATIVE ACTIVITY  . Codeine Other (See Comments)    Agitation and aggressive   . Hydrocodone Other (See Comments)    Makes patient hallucinate, cannot sleep  . Oxycodone Other (See Comments)    Makes patient hallucinate, cannot sleep  . Lisinopril Other (See Comments)  . Penicillins Hives  . Sulfa Antibiotics Other (See Comments)    Per [facility]     Past Medical History  Diagnosis Date  . Hypertension   . Arthritis   . Ex-smoker   . Rheumatoid arthritis(714.0)   . Pulmonary nodule   . DDD (degenerative disc disease)   . Complication of anesthesia     " I am difficult to wake up "  . Chronic kidney disease     acute 2013, 2014 on chronic stage 3.  . Anemia   . Proteus septicemia 02/2013  . Barrett's esophagus 04/2006    egd by dr 05/2006.   . Colon polyp 04/2006    Hyperplastic on colonoscopy by Dr 05/2006.   . Encephalopathy, metabolic 02/2013  . COPD (chronic obstructive pulmonary disease)     History:  obtained from chart review. Tobacco/alcohol: unknown tobacco use none  Pt orientation to unit, room and routine. Information packet given to patient/family and safety video watched.   Admission INP armband ID verified with patient/family, and in place. SR up x 2, fall risk assessment complete with Patient verbalizing understanding of risks associated with falls. Pt verbalizes an understanding of how to use the call bell and to call for help before getting out of bed.  Skin, clean-dry- intact without evidence of bruising, or skin tears.   No evidence of skin break down noted on exam.     Will cont to monitor and assist as needed.  03/2013, RN 06/11/2015 4:30 PM

## 2015-06-12 ENCOUNTER — Encounter: Payer: Self-pay | Admitting: Internal Medicine

## 2015-06-12 DIAGNOSIS — J441 Chronic obstructive pulmonary disease with (acute) exacerbation: Secondary | ICD-10-CM

## 2015-06-12 MED ORDER — SCOPOLAMINE 1 MG/3DAYS TD PT72
1.0000 | MEDICATED_PATCH | TRANSDERMAL | Status: DC
  Administered 2015-06-12: 1.5 mg via TRANSDERMAL
  Filled 2015-06-12: qty 1

## 2015-06-12 MED ORDER — LORAZEPAM 2 MG/ML IJ SOLN: 0.5000 mg | INTRAMUSCULAR | Status: DC | PRN

## 2015-06-12 MED ORDER — TORSEMIDE 20 MG PO TABS
20.0000 mg | ORAL_TABLET | Freq: Every day | ORAL | Status: DC
  Administered 2015-06-12: 20 mg via ORAL
  Filled 2015-06-12: qty 1

## 2015-06-12 MED ORDER — LEVALBUTEROL HCL 0.63 MG/3ML IN NEBU: 0.6300 mg | INHALATION_SOLUTION | Freq: Three times a day (TID) | RESPIRATORY_TRACT | Status: DC | PRN

## 2015-06-12 MED ORDER — FUROSEMIDE 10 MG/ML IJ SOLN
20.0000 mg | Freq: Every day | INTRAMUSCULAR | Status: DC
  Administered 2015-06-12 – 2015-06-13 (×2): 20 mg via INTRAVENOUS
  Filled 2015-06-12 (×2): qty 2

## 2015-06-12 MED ORDER — HALOPERIDOL LACTATE 5 MG/ML IJ SOLN: 0.5000 mg | Freq: Three times a day (TID) | INTRAMUSCULAR | Status: DC | PRN

## 2015-06-12 MED ORDER — FUROSEMIDE 10 MG/ML IJ SOLN
20.0000 mg | Freq: Once | INTRAMUSCULAR | Status: AC
  Administered 2015-06-12: 20 mg via INTRAVENOUS
  Filled 2015-06-12: qty 2

## 2015-06-12 MED FILL — Guaifenesin Tab 200 MG: ORAL | Qty: 2 | Status: AC

## 2015-06-12 NOTE — Progress Notes (Signed)
RN called RT to come look at patient. RN had placed patient on a venti mask. Patient is on 40% sating 91%. RT gave patient PRN neb to help with breathing. RR is 20, but patient seems to be working at his breathing. RT will continue to monitor

## 2015-06-12 NOTE — Consult Note (Signed)
[facility] Visited the patient at bedside.  He has dyspnea, using accessory muscles and O2 @ 6L/M The patient appears frightened and skin is flushed. Spoke with assigned nurse who will administer prn Dilaudid now and will obtain IV med for anxiety. Notes indicate that the patient has required use of Ventimask periodically during the day. Spoke with daughter, Bernie Ransford, via phone to advise of the patient's current status and to offer [facility] as an alternative to continued hospitalization. [name] works until 5:00pm every day and has not been present when attending physician has visited.  Plan: [name] will visit the patient later this evening, and requests that attending physician contact her via phone: [phone] Contact information for [facility] liaison given to [facility]. Will follow up with [facility] tomorrow regarding possibility of hospice facility.  Nelson Chimes RN, [facility] [phone]

## 2015-06-12 NOTE — Progress Notes (Signed)
ANTIBIOTIC CONSULT NOTE - FOLLOW UP  Pharmacy Consult for Vancomycin and Ceftaz Indication: sepsis  Allergies  Allergen Reactions  . Ciprofloxacin Other (See Comments)    CRAZY TALK, WEAKNESS, SEDATIVE ACTIVITY  . Codeine Other (See Comments)    Agitation and aggressive   . Hydrocodone Other (See Comments)    Makes patient hallucinate, cannot sleep  . Oxycodone Other (See Comments)    Makes patient hallucinate, cannot sleep  . Lisinopril Other (See Comments)  . Penicillins Hives  . Sulfa Antibiotics Other (See Comments)    Per [facility]    Patient Measurements: Height: 5\' 5"  (165.1 cm) Weight: 184 lb 4.9 oz (83.6 kg) IBW/kg (Calculated) : 61.5  Vital Signs: Temp: 97.7 F (36.5 C) (09/14 0825) Temp Source: Oral (09/14 0825) BP: 142/80 mmHg (09/14 0825) Pulse Rate: 98 (09/14 1026) Intake/Output from previous day: 09/13 0701 - 09/14 0700 In: 1185 [P.O.:240; I.V.:945] Out: -  Intake/Output from this shift: Total I/O In: 140 [P.O.:30; I.V.:110] Out: -   Labs:  Recent Labs  06/10/15 0055 06/11/15 0323  WBC 6.1  --   HGB 10.3*  --   PLT 133*  --   CREATININE 2.66* 2.78*   Estimated Creatinine Clearance: 19.7 mL/min (by C-G formula based on Cr of 2.78). No results for input(s): VANCOTROUGH, VANCOPEAK, VANCORANDOM, GENTTROUGH, GENTPEAK, GENTRANDOM, TOBRATROUGH, TOBRAPEAK, TOBRARND, AMIKACINPEAK, AMIKACINTROU, AMIKACIN in the last 72 hours.   Microbiology: Recent Results (from the past 720 hour(s))  Blood Culture (routine x 2)     Status: None (Preliminary result)   Collection Time: 06/09/15 10:10 AM  Result Value Ref Range Status   Specimen Description RIGHT ANTECUBITAL  Final   Special Requests BOTTLES DRAWN AEROBIC AND ANAEROBIC 5CC  Final   Culture NO GROWTH 2 DAYS  Final   Report Status PENDING  Incomplete  Blood Culture (routine x 2)     Status: None (Preliminary result)   Collection Time: 06/09/15 10:18 AM  Result Value Ref Range Status   Specimen Description  BLOOD LEFT HAND  Final   Special Requests BOTTLES DRAWN AEROBIC AND ANAEROBIC 4CC  Final   Culture NO GROWTH 2 DAYS  Final   Report Status PENDING  Incomplete  Urine culture     Status: None   Collection Time: 06/09/15 12:19 PM  Result Value Ref Range Status   Specimen Description URINE, CLEAN CATCH  Final   Special Requests NONE  Final   Culture MULTIPLE SPECIES PRESENT, SUGGEST RECOLLECTION  Final   Report Status 06/11/2015 FINAL  Final  MRSA PCR Screening     Status: None   Collection Time: 06/09/15  4:37 PM  Result Value Ref Range Status   MRSA by PCR NEGATIVE NEGATIVE Final    Comment:        The GeneXpert MRSA Assay (FDA approved for NASAL specimens only), is one component of a comprehensive MRSA colonization surveillance program. It is not intended to diagnose MRSA infection nor to guide or monitor treatment for MRSA infections.     Anti-infectives    Start     Dose/Rate Route Frequency Ordered Stop   06/11/15 1200  vancomycin (VANCOCIN) IVPB 1000 mg/200 mL premix     1,000 mg 200 mL/hr over 60 Minutes Intravenous Every 48 hours 06/09/15 1024     06/10/15 1100  cefTAZidime (FORTAZ) 2 g in dextrose 5 % 50 mL IVPB     2 g 100 mL/hr over 30 Minutes Intravenous Every 24 hours 06/09/15 1024  06/09/15 1800  vancomycin (VANCOCIN) 50 mg/mL oral solution 250 mg  Status:  Discontinued     250 mg Oral 4 times per day 06/09/15 1636 06/11/15 1415   06/09/15 1645  piperacillin-tazobactam (ZOSYN) IVPB 3.375 g  Status:  Discontinued     3.375 g 100 mL/hr over 30 Minutes Intravenous  Once 06/09/15 1636 06/09/15 1641   06/09/15 1645  vancomycin (VANCOCIN) IVPB 1000 mg/200 mL premix  Status:  Discontinued     1,000 mg 200 mL/hr over 60 Minutes Intravenous  Once 06/09/15 1636 06/09/15 1641   06/09/15 1000  vancomycin (VANCOCIN) 1,250 mg in sodium chloride 0.9 % 250 mL IVPB     1,250 mg 166.7 mL/hr over 90 Minutes Intravenous  Once 06/09/15 0936 06/09/15 1316   06/09/15 0945   cefTAZidime (FORTAZ) 2 g in dextrose 5 % 50 mL IVPB     2 g 100 mL/hr over 30 Minutes Intravenous  Once 06/09/15 0935 06/09/15 1106      Assessment: 81 yo M continues on antibiotics for sepsis due to UTI.  Cx still pending with NGTD.  WBC wnl.  Renal function remains stable with CrCl ~ 20 ml/min.  Goal of Therapy:  Vancomycin trough level 15-20 mcg/ml  Resolution of infection  Plan:  Continue Ceftazidime 2gm IV q24h Continue Vancomycin 1gm IV q48h Consider de-escalation of therapy as clinical progress improves  [employer], Pharm.D., BCPS Clinical Pharmacist Pager [phone] 06/12/2015 3:55 PM

## 2015-06-12 NOTE — Progress Notes (Signed)
[facility] PROGRESS NOTE  [name] GBT:[id number] DOB: [birth year] DOA: 06/09/2015 PCP: [name]  Assessment/Plan: 1-severe sepsis due to UTI: patient presented hypotensive with SBP in the 80's; elevated lactic acid, tachycardic (HR up to 120's), RR up to 27-30 and worsening renal function. UA suggesting UTI.  -has decline and decompensated -after discussion with family and per patient wishes will focus on comfort measures -will stop medications and focus on comfort measures -patient is DNR/DNI/no pressors -anticipate hours to days  2-elevated troponin: due to demand ischemia: no CP -patient also denies SOB -not a candidate for any invasive therapy -appreciate cardiology inputs -no WMA seen on echo; but technically inadequate for good assessment -will stop medications and focus on comfort measures     3-GERD:  -will stop medications and focus on comfort measures  4-diarrhea: at home; most likely viral gastroenteritis  -no further BM's here  5-end stage COPD with chronic resp failure: will continue home inhaler therapy -increase work ob breathing; but not wheezing -will stop steroids -will monitor and continue O2 supplementation -will start PRN dilaudid and ativan  6-BPH: - will stop medications and focus on comfort measures  7-acute on chronic renal failure: stage 3 at baseline -due to hypotension and UTI -last Cr 2.7 -given degree of crackles and labor breathing will use low dose lasix for palliation of symptoms -main goal is comfort care  8-depression: -unable to tolerate PO meds -will stop medications and focus on comfort measures   Code Status: DNR/DNI Family Communication: no family at bedside Disposition Plan: with further decline in his already poor quality condition, family agreeing with comfort care and if stable most likely hospice facility in am.   Consultants:  Cardiology   Hospice    Procedures:  See below for x-ray  reports   Antibiotics:  [name] 9/11>>9/14  Vancomycin  9/11>>9/14  HPI/Subjective: Afebrile, somnolent/lethargic and unable to eat/drink, take PO meds or follow any commands. Increase work of breathing and positive crackles/rhonchi  Objective: Filed Vitals:   06/12/15 1026  BP:   Pulse: 98  Temp:   Resp: 18    Intake/Output Summary (Last 24 hours) at 06/12/15 2116 Last data filed at 06/12/15 1853  Gross per 24 hour  Intake    190 ml  Output      0 ml  Net    190 ml   Filed Weights   06/10/15 0500 06/11/15 0402 06/12/15 0500  Weight: 76.4 kg (168 lb 6.9 oz) 79.9 kg (176 lb 2.4 oz) 83.6 kg (184 lb 4.9 oz)    Exam:   General:  Patient somnolent and not able to follow commands. Mild increase work of breathing and positive crackles/increase oral secretions. He has not responded further as expected to treatment for sepsis and in fact has deteriorate/decline. No fever.  Cardiovascular: S1 and S2, no rubs, no gallops  Respiratory: increase work of breathing, no wheezing, positive crackles and rhonchi  Abdomen: soft, NT, ND, positive BS  Musculoskeletal: no edema, no cyanosis or clubbing   Data Reviewed: Basic Metabolic Panel:  Recent Labs Lab 06/09/15 0930 06/09/15 1822 06/10/15 0055 06/11/15 0323  NA 143  --  143 139  K 3.7  --  4.0 4.0  CL 101  --  110 107  CO2 28  --  21* 21*  GLUCOSE 84  --  97 122*  BUN 48*  --  44* 41*  CREATININE 2.93*  --  2.66* 2.78*  CALCIUM 10.1  --  8.1* 8.4*  MG  --  1.4*  --   --    Liver Function Tests:  Recent Labs Lab 06/09/15 0930  AST 41  ALT 30  ALKPHOS 90  BILITOT 1.3*  PROT 5.5*  ALBUMIN 2.9*   CBC:  Recent Labs Lab 06/09/15 0930 06/10/15 0055  WBC 8.1 6.1  NEUTROABS 7.1  --   HGB 12.5* 10.3*  HCT 41.6 35.3*  MCV 96.5 98.6  PLT 172 133*   Cardiac Enzymes:  Recent Labs Lab 06/09/15 0930 06/09/15 1330 06/09/15 1822 06/10/15 0055  TROPONINI 0.10* 0.16* 0.16* 0.13*   CBG: No results for  input(s): GLUCAP in the last 168 hours.  Recent Results (from the past 240 hour(s))  Blood Culture (routine x 2)     Status: None (Preliminary result)   Collection Time: 06/09/15 10:10 AM  Result Value Ref Range Status   Specimen Description RIGHT ANTECUBITAL  Final   Special Requests BOTTLES DRAWN AEROBIC AND ANAEROBIC 5CC  Final   Culture NO GROWTH 3 DAYS  Final   Report Status PENDING  Incomplete  Blood Culture (routine x 2)     Status: None (Preliminary result)   Collection Time: 06/09/15 10:18 AM  Result Value Ref Range Status   Specimen Description BLOOD LEFT HAND  Final   Special Requests BOTTLES DRAWN AEROBIC AND ANAEROBIC 4CC  Final   Culture NO GROWTH 3 DAYS  Final   Report Status PENDING  Incomplete  Urine culture     Status: None   Collection Time: 06/09/15 12:19 PM  Result Value Ref Range Status   Specimen Description URINE, CLEAN CATCH  Final   Special Requests NONE  Final   Culture MULTIPLE SPECIES PRESENT, SUGGEST RECOLLECTION  Final   Report Status 06/11/2015 FINAL  Final  MRSA PCR Screening     Status: None   Collection Time: 06/09/15  4:37 PM  Result Value Ref Range Status   MRSA by PCR NEGATIVE NEGATIVE Final    Comment:        The GeneXpert MRSA Assay (FDA approved for NASAL specimens only), is one component of a comprehensive MRSA colonization surveillance program. It is not intended to diagnose MRSA infection nor to guide or monitor treatment for MRSA infections.      Studies: No results found.  Scheduled Meds: . antiseptic oral rinse  7 mL Mouth Rinse BID  . arformoterol  15 mcg Nebulization BID  . aspirin  81 mg Oral Daily  . budesonide (PULMICORT) nebulizer solution  0.5 mg Nebulization BID  . cefTAZidime (FORTAZ)  IV  2 g Intravenous Q24H  . escitalopram  20 mg Oral Daily  . famotidine  20 mg Oral Daily  . gabapentin  100 mg Oral QHS  . guaifenesin  400 mg Oral 6 times per day  . heparin  5,000 Units Subcutaneous 3 times per day  .  hydrocerin   Topical Daily  . hydrocortisone sod succinate (SOLU-CORTEF) inj  100 mg Intravenous Q8H  . ipratropium  0.5 mg Nebulization BID  . levalbuterol  0.63 mg Nebulization BID  . sodium chloride  3 mL Intravenous Q12H  . tamsulosin  0.4 mg Oral QPC supper  . tobramycin  1 drop Both Eyes 6 times per day  . torsemide  20 mg Oral Daily  . vancomycin  1,000 mg Intravenous Q48H   Continuous Infusions:    Time spent: 30 minutes   Vassie Loll  [facility] Pager [phone]. If 7PM-7AM, please contact night-coverage  at [email or web address], password [facility] 06/12/2015, 9:16 PM  LOS: 3 days

## 2015-06-12 NOTE — Care Management Note (Signed)
Case Management Note  Patient Details  Name: [name] MRN: [id number] Date of Birth: May 02, 1928  Subjective/Objective:   NCM spoke with [name], daughter, she states patient lives with wife , but they are separated legally.  Wife works part-time and she bipolar. Wife sleeps in camper at night, so either [name], or her cousin comes in to be with him.  [name] states [facility] who comes in to help with food, they can not help him dress though.  The aides come 3 hrs per day 5 days a week.  Before he came to hospital pam stated patient could help to get himself in bed by moving around a little bit.  [name] stated that [facility] is providing services now . She was told to look into residential hospice to see what it entailed.    NCM informed her that at the Palliative meeting they will explain more about residential hospice vs home hospice.  NCM will cont to follow for dc needs.                   Action/Plan:   Expected Discharge Date:                  Expected Discharge Plan:  Home w Hospice Care  In-House Referral:  Clinical Social Work  Discharge planning Services  CM Consult  Post Acute Care Choice:    Choice offered to:     DME Arranged:    DME Agency:     HH Arranged:  RN HH Agency:  [facility]  Status of Service:  In process, will continue to follow  Medicare Important Message Given:  Yes-second notification given Date Medicare IM Given:    Medicare IM give by:    Date Additional Medicare IM Given:    Additional Medicare Important Message give by:     If discussed at Long Length of Stay Meetings, dates discussed:    Additional Comments:  [name], RN 06/12/2015, 4:34 PM

## 2015-06-12 NOTE — Care Management Important Message (Signed)
Important Message  Patient Details  Name: [name] MRN: [id number] Date of Birth: November 04, 1927   Medicare Important Message Given:  Yes-second notification given    Kyla Balzarine 06/12/2015, 11:56 AM

## 2015-06-13 DIAGNOSIS — I4891 Unspecified atrial fibrillation: Secondary | ICD-10-CM | POA: Insufficient documentation

## 2015-06-13 DIAGNOSIS — J449 Chronic obstructive pulmonary disease, unspecified: Secondary | ICD-10-CM | POA: Insufficient documentation

## 2015-06-13 MED ORDER — HYDROMORPHONE HCL 1 MG/ML IJ SOLN: 0.5000 mg | INTRAMUSCULAR | Status: AC | PRN

## 2015-06-13 MED ORDER — LORAZEPAM 2 MG/ML IJ SOLN: 0.5000 mg | INTRAMUSCULAR | Status: AC | PRN

## 2015-06-13 MED ORDER — ARFORMOTEROL TARTRATE 15 MCG/2ML IN NEBU: 15.0000 ug | INHALATION_SOLUTION | Freq: Two times a day (BID) | RESPIRATORY_TRACT | Status: AC

## 2015-06-13 MED ORDER — SCOPOLAMINE 1 MG/3DAYS TD PT72: 1.0000 | MEDICATED_PATCH | TRANSDERMAL | Status: AC

## 2015-06-13 MED ORDER — HALOPERIDOL LACTATE 5 MG/ML IJ SOLN: 0.5000 mg | Freq: Three times a day (TID) | INTRAMUSCULAR | Status: AC | PRN

## 2015-06-13 NOTE — Progress Notes (Signed)
[name] is focusing on comfort measures. Not a candidate for any cardiac interventions.  Will sign off for now.  Please feel free to call if there are any additional questions.  Zakiyyah Savannah C. Duke Salvia, MD 06/13/2015 10:46 AM

## 2015-06-13 NOTE — Care Management Note (Signed)
Case Management Note  Patient Details  Name: KAIDYN JAVID MRN: [id number] Date of Birth: [birth year]  Subjective/Objective:     Patient is for dc to [facility] today, CSW following.                Action/Plan:   Expected Discharge Date:                  Expected Discharge Plan:  Home w Hospice Care  In-House Referral:  Clinical Social Work  Discharge planning Services  CM Consult  Post Acute Care Choice:    Choice offered to:     DME Arranged:    DME Agency:     HH Arranged:  RN HH Agency:  [facility]  Status of Service:  Completed, signed off  Medicare Important Message Given:  Yes-second notification given Date Medicare IM Given:    Medicare IM give by:    Date Additional Medicare IM Given:    Additional Medicare Important Message give by:     If discussed at Long Length of Stay Meetings, dates discussed:    Additional Comments:  [name], RN 06/13/2015, 3:32 PM

## 2015-06-13 NOTE — Clinical Social Work Note (Signed)
CSW has sent referral to [facility] per family's request. Facility states they have a bed available and can accept patient as long as he is appropriate for their facility. CSW will continue to follow.   Roddie Mc MSW, [place], [place], [phone]

## 2015-06-13 NOTE — Consult Note (Signed)
[facility] Met with family to discuss the patients decline and the futility of further aggressive treatment. They are agreeable to end of life care in an in-patient hospice facility but state that [facility] is more convenient for them to visit.  Contacted Marval Regal at [facility] to advise that a transfer can be done from [place] to [place]. Transfer document signed by family. [facility] Liaison is coming to see the patient and meet with the family. [facility] office has faxed relevant documents to [place]. Please call if there are any questions.  Yetta Glassman RN, [facility] [place]

## 2015-06-13 NOTE — Discharge Summary (Signed)
Physician Discharge Summary  [name]:[id number] DOB: Sep 08, 1928 DOA: 06/09/2015  PCP: [name]  Admit date: 06/09/2015 Discharge date: 06/13/2015  Time spent: 40 minutes  Recommendations for Outpatient Follow-up:  Full Comfort care  Discharge Diagnoses:  Principal Problem:   Severe sepsis Active Problems:   Rheumatoid arthritis   Sinus tachycardia   Acute worsening of stage 3 chronic kidney disease   Decompensated COPD with exacerbation (chronic obstructive pulmonary disease)   Chest pain at rest   Left leg weakness   Hypotension   Elevated troponin   Lactic acidosis   UTI (lower urinary tract infection)   Sepsis   Respiratory failure with hypoxia and hypercapnia   Discharge Condition: Vitals signs are stable, patient in mild resp distress and with increase EOL secretions. 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 He has end stage COPD on Hospice, rheumatoid arthritis, chronic kidney disease, and hypertension. He has a history of CABG. He is normally on 5 L of oxygen at home. He presents the emergency department today with burning chest pain and difficulty breathing. As the patient is having difficulty breathing his family provides the history. Reportedly the patient has not been eating well all week. Thursday he was lifted by a family member and shortly after developed left-sided pain and swelling. The family is worried he had broken a rib. On Friday they noticed that he was urinating less frequently. On Saturday he had 1 episode of vomiting and multiple episodes of diarrhea. He was recently treated for a UTI with a course of Keflex that ended Wednesday. This morning the patient  developed chest pain at approximately 8 AM the pain is located centrally. It does not seem to radiate. It feels like both burning and pressure. In the ER the patient firmly confirms that he does not want narcotic medications. He wants to be of clear mind. Oxycodone makes him hallucinate. His initial blood pressure was 60/35. After receiving some fluids improved but has now decreased to 85/52. Heart rate has been between 113-139. His troponin is elevated at 0.10. O2 sats were 87%, His creatinine is normally 2.5-2.9. EKG shows rapid A. fib.  Hospital Course:  1-severe sepsis due to UTI: patient presented hypotensive with SBP in the 80's; elevated lactic acid, tachycardic (HR up to 120's), RR up to 27-30 and worsening renal function. UA suggesting UTI.  -patient failed to satisfactorily respond further to treatment for sepsis and in fact has deteriorate/decline -after discussion with family and per patient wishes will stop curative approach and focus comfort care -patient is DNR/DNI/no pressors -patient will be transfer to [facility] for symptoms management, controlled of increase EOL secretions and comfort care -anticipate less than 4 weeks  2-elevated troponin: due to demand ischemia: no CP -patient also denies SOB -not a candidate for any invasive therapy -appreciate cardiology inputs -no WMA seen on echo; but technically inadequate for good assessment -will stop medications and focus on comfort measures  3-GERD:  -will stop medications and focus on comfort measures  4-diarrhea: at home; most likely viral gastroenteritis  -no further BM's here  5-end stage COPD with chronic resp failure: will continue  home inhaler therapy -increase work ob breathing; but not wheezing -no further steroids  -will monitor and continue O2 supplementation -will use PRN dilaudid and ativan  6-BPH: - will stop medications and focus on comfort measures  7-acute on chronic renal failure:  stage 3 at baseline -due to hypotension and UTI -last Cr 2.7 -main goal is comfort care now -no further blood work -will aim treatment to comfort medications  8-depression/anxiety: -unable to tolerate PO meds -will stop medications and focus on comfort measures -will use PRN ativan  Procedures:  See below for x-ray reports   2-D echo - Left ventricle: The cavity size was normal. Systolic function was normal. The estimated ejection fraction was in the range of 55% to 60%. Although no diagnostic regional wall motion abnormality was identified, this possibility cannot be completely excluded on the basis of this study. Doppler parameters are consistent with abnormal left ventricular relaxation (grade 1 diastolic dysfunction). - Aortic valve: Valve area (VTI): 2.63 cm^2. Valve area (Vmax): 2.22 cm^2. Valve area (Vmean): 2.38 cm^2. - Mitral valve: There was mild regurgitation. - Right atrium: The atrium was mildly dilated. - Pulmonary arteries: Systolic pressure was moderately increased. PA peak pressure: 53 mm Hg (S).  Consultations:  Hospice medicine  Cardiology   Discharge Exam: Filed Vitals:   06/13/15 1304  BP: 98/48  Pulse: 85  Temp: 97.4 F (36.3 C)  Resp: 20    General: Patient has remained somnolent/lethargic and not able to follow commands or take PO meds. Still with some work of breathing and positive crackles/increase oral secretions. He failed to satisfactorily respond further to treatment for sepsis and in fact has deteriorate/decline. Patient is afebrile.  Cardiovascular: S1 and S2, no rubs, no gallops  Respiratory: increase work of breathing, no wheezing, positive crackles and rhonchi  Abdomen: soft, NT, ND, positive BS  Musculoskeletal: no edema, no cyanosis or clubbing  Discharge Instructions   Discharge Instructions    Discharge instructions    Complete by:  As directed   Full comfort care Please tailor/adjust medications  for comfort as needed Comfort feeding          Current Discharge Medication List    START taking these medications   Details  arformoterol (BROVANA) 15 MCG/2ML NEBU Take 2 mLs (15 mcg total) by nebulization 2 (two) times daily.    haloperidol lactate (HALDOL) 5 MG/ML injection Inject 0.1-0.2 mLs (0.5-1 mg total) into the vein every 8 (eight) hours as needed.    HYDROmorphone (DILAUDID) 1 MG/ML injection Inject 0.5 mLs (0.5 mg total) into the vein every 4 (four) hours as needed for severe pain.    LORazepam (ATIVAN) 2 MG/ML injection Inject 0.25 mLs (0.5 mg total) into the vein every 3 (three) hours as needed for anxiety (EOL agitation). Qty: 1 mL, Refills: 0    scopolamine (TRANSDERM-SCOP) 1 MG/3DAYS Place 1 patch (1.5 mg total) onto the skin every 3 (three) days. Qty: 10 patch, Refills: 12      CONTINUE these medications which have NOT CHANGED   Details  albuterol (PROVENTIL) (2.5 MG/3ML) 0.083% nebulizer solution Take 2.5 mg by nebulization every 6 (six) hours as needed for wheezing or shortness of breath. For shortness of breath    Skin Protectants, Misc. (EUCERIN) cream Apply 1 application topically daily.      STOP taking these medications     clotrimazole (LOTRIMIN) 1 % cream      escitalopram (LEXAPRO) 5 MG tablet      gabapentin (NEURONTIN) 100  MG capsule      guaifenesin (HUMIBID E) 400 MG TABS tablet      haloperidol (HALDOL) 0.5 MG tablet      Loperamide HCl (IMODIUM A-D PO)      omeprazole (PRILOSEC) 40 MG capsule      predniSONE (DELTASONE) 5 MG tablet      Tamsulosin HCl (FLOMAX) 0.4 MG CAPS      torsemide (DEMADEX) 20 MG tablet      gentamicin (GARAMYCIN) 0.3 % ophthalmic solution        Allergies  Allergen Reactions  . Ciprofloxacin Other (See Comments)    CRAZY TALK, WEAKNESS, SEDATIVE ACTIVITY  . Codeine Other (See Comments)    Agitation and aggressive   . Hydrocodone Other (See Comments)    Makes patient hallucinate, cannot sleep  .  Oxycodone Other (See Comments)    Makes patient hallucinate, cannot sleep  . Lisinopril Other (See Comments)  . Penicillins Hives  . Sulfa Antibiotics Other (See Comments)    Per MAR    The results of significant diagnostics from this hospitalization (including imaging, microbiology, ancillary and laboratory) are listed below for reference.    Significant Diagnostic Studies: Dg Chest Port 1 View  06/09/2015   CLINICAL DATA:  Chest pain for 4 days.  EXAM: PORTABLE CHEST - 1 VIEW  COMPARISON:  04/17/2015  FINDINGS: Previous median sternotomy and CABG procedure. Mild cardiac enlargement. Lung volumes are suboptimally inflated. Bibasilar atelectasis again noted.  IMPRESSION: 1. Similar appearance of low lung volumes and bibasilar atelectasis.   Electronically Signed   By: Signa Kell M.D.   On: 06/09/2015 10:04    Microbiology: Recent Results (from the past 240 hour(s))  Blood Culture (routine x 2)     Status: None (Preliminary result)   Collection Time: 06/09/15 10:10 AM  Result Value Ref Range Status   Specimen Description RIGHT ANTECUBITAL  Final   Special Requests BOTTLES DRAWN AEROBIC AND ANAEROBIC 5CC  Final   Culture NO GROWTH 3 DAYS  Final   Report Status PENDING  Incomplete  Blood Culture (routine x 2)     Status: None (Preliminary result)   Collection Time: 06/09/15 10:18 AM  Result Value Ref Range Status   Specimen Description BLOOD LEFT HAND  Final   Special Requests BOTTLES DRAWN AEROBIC AND ANAEROBIC 4CC  Final   Culture NO GROWTH 3 DAYS  Final   Report Status PENDING  Incomplete  Urine culture     Status: None   Collection Time: 06/09/15 12:19 PM  Result Value Ref Range Status   Specimen Description URINE, CLEAN CATCH  Final   Special Requests NONE  Final   Culture MULTIPLE SPECIES PRESENT, SUGGEST RECOLLECTION  Final   Report Status 06/11/2015 FINAL  Final  MRSA PCR Screening     Status: None   Collection Time: 06/09/15  4:37 PM  Result Value Ref Range Status    MRSA by PCR NEGATIVE NEGATIVE Final    Comment:        The GeneXpert MRSA Assay (FDA approved for NASAL specimens only), is one component of a comprehensive MRSA colonization surveillance program. It is not intended to diagnose MRSA infection nor to guide or monitor treatment for MRSA infections.      Labs: Basic Metabolic Panel:  Recent Labs Lab 06/09/15 0930 06/09/15 1822 06/10/15 0055 06/11/15 0323  NA 143  --  143 139  K 3.7  --  4.0 4.0  CL 101  --  110 107  CO2 28  --  21* 21*  GLUCOSE 84  --  97 122*  BUN 48*  --  44* 41*  CREATININE 2.93*  --  2.66* 2.78*  CALCIUM 10.1  --  8.1* 8.4*  MG  --  1.4*  --   --    Liver Function Tests:  Recent Labs Lab 06/09/15 0930  AST 41  ALT 30  ALKPHOS 90  BILITOT 1.3*  PROT 5.5*  ALBUMIN 2.9*   CBC:  Recent Labs Lab 06/09/15 0930 06/10/15 0055  WBC 8.1 6.1  NEUTROABS 7.1  --   HGB 12.5* 10.3*  HCT 41.6 35.3*  MCV 96.5 98.6  PLT 172 133*   Cardiac Enzymes:  Recent Labs Lab 06/09/15 0930 06/09/15 1330 06/09/15 1822 06/10/15 0055  TROPONINI 0.10* 0.16* 0.16* 0.13*   Signed:  Vassie Loll  [facility] 06/13/2015, 2:18 PM

## 2015-06-13 NOTE — Progress Notes (Signed)
Nutrition Brief Note  Chart reviewed. Pt now transitioning to comfort care.  No further nutrition interventions warranted at this time.  Please re-consult as needed.   Lucendia Leard A. Alura Olveda, RD, LDN, CDE Pager: [phone] After hours Pager: [phone]  

## 2015-06-14 LAB — CULTURE, BLOOD (ROUTINE X 2)
CULTURE: NO GROWTH
Culture: NO GROWTH

## 2015-06-29 DEATH — deceased
